# Patient Record
Sex: Female | Born: 1935
Health system: Southern US, Community
[De-identification: ages and names within clinical notes are randomized; demographics above are authoritative.]

## PROBLEM LIST (undated history)

## (undated) DIAGNOSIS — J45909 Unspecified asthma, uncomplicated: Secondary | ICD-10-CM

## (undated) DIAGNOSIS — J449 Chronic obstructive pulmonary disease, unspecified: Secondary | ICD-10-CM

## (undated) DIAGNOSIS — K219 Gastro-esophageal reflux disease without esophagitis: Secondary | ICD-10-CM

## (undated) DIAGNOSIS — I10 Essential (primary) hypertension: Secondary | ICD-10-CM

## (undated) DIAGNOSIS — M1990 Unspecified osteoarthritis, unspecified site: Secondary | ICD-10-CM

## (undated) HISTORY — PX: CHOLECYSTECTOMY: SHX55

---

## 2013-08-10 ENCOUNTER — Encounter (HOSPITAL_COMMUNITY): Payer: Self-pay | Admitting: Emergency Medicine

## 2013-08-10 ENCOUNTER — Emergency Department (HOSPITAL_COMMUNITY): Payer: Medicare (Managed Care)

## 2013-08-10 ENCOUNTER — Emergency Department (HOSPITAL_COMMUNITY)
Admission: EM | Admit: 2013-08-10 | Discharge: 2013-08-10 | Disposition: A | Discharge status: Home / Self Care | Payer: Medicare (Managed Care) | Attending: Emergency Medicine | Admitting: Emergency Medicine

## 2013-08-10 DIAGNOSIS — J449 Chronic obstructive pulmonary disease, unspecified: Secondary | ICD-10-CM | POA: Diagnosis not present

## 2013-08-10 DIAGNOSIS — Z8719 Personal history of other diseases of the digestive system: Secondary | ICD-10-CM | POA: Insufficient documentation

## 2013-08-10 DIAGNOSIS — I10 Essential (primary) hypertension: Secondary | ICD-10-CM | POA: Diagnosis not present

## 2013-08-10 DIAGNOSIS — J45909 Unspecified asthma, uncomplicated: Secondary | ICD-10-CM

## 2013-08-10 DIAGNOSIS — J4489 Other specified chronic obstructive pulmonary disease: Secondary | ICD-10-CM | POA: Insufficient documentation

## 2013-08-10 DIAGNOSIS — R0602 Shortness of breath: Secondary | ICD-10-CM | POA: Diagnosis present

## 2013-08-10 HISTORY — DX: Essential (primary) hypertension: I10

## 2013-08-10 HISTORY — DX: Unspecified asthma, uncomplicated: J45.909

## 2013-08-10 HISTORY — DX: Chronic obstructive pulmonary disease, unspecified: J44.9

## 2013-08-10 HISTORY — DX: Gastro-esophageal reflux disease without esophagitis: K21.9

## 2013-08-10 LAB — CBC
HCT: 37.7 % (ref 36.0–46.0)
Hemoglobin: 12.7 g/dL (ref 12.0–15.0)
MCH: 31.1 pg (ref 26.0–34.0)
MCHC: 33.7 g/dL (ref 30.0–36.0)
MCV: 92.2 fL (ref 78.0–100.0)
PLATELETS: 192 10*3/uL (ref 150–400)
RBC: 4.09 MIL/uL (ref 3.87–5.11)
RDW: 13.3 % (ref 11.5–15.5)
WBC: 6.4 10*3/uL (ref 4.0–10.5)

## 2013-08-10 LAB — BASIC METABOLIC PANEL
BUN: 15 mg/dL (ref 6–23)
CO2: 24 meq/L (ref 19–32)
CREATININE: 0.9 mg/dL (ref 0.50–1.10)
Calcium: 10.2 mg/dL (ref 8.4–10.5)
Chloride: 100 mEq/L (ref 96–112)
GFR calc Af Amer: 68 mL/min — ABNORMAL LOW (ref >90)
GFR, EST NON AFRICAN AMERICAN: 59 mL/min — AB (ref >90)
GLUCOSE: 86 mg/dL (ref 70–99)
Potassium: 4.1 mEq/L (ref 3.7–5.3)
Sodium: 138 mEq/L (ref 137–147)

## 2013-08-10 LAB — PRO B NATRIURETIC PEPTIDE: Pro B Natriuretic peptide (BNP): 68.8 pg/mL (ref 0–450)

## 2013-08-10 MED ORDER — METHYLPREDNISOLONE SODIUM SUCC 125 MG IJ SOLR
125.0000 mg | Freq: Once | INTRAMUSCULAR | Status: AC
  Administered 2013-08-10: 125 mg via INTRAVENOUS
  Filled 2013-08-10: qty 2

## 2013-08-10 MED ORDER — PREDNISONE 10 MG PO TABS: 20.0000 mg | ORAL_TABLET | Freq: Every day | ORAL | Status: DC

## 2013-08-10 MED ORDER — ALBUTEROL SULFATE (2.5 MG/3ML) 0.083% IN NEBU
INHALATION_SOLUTION | RESPIRATORY_TRACT | Status: AC
  Administered 2013-08-10: 14:00:00
  Filled 2013-08-10: qty 3

## 2013-08-10 MED ORDER — ALBUTEROL (5 MG/ML) CONTINUOUS INHALATION SOLN
10.0000 mg/h | INHALATION_SOLUTION | RESPIRATORY_TRACT | Status: AC
  Administered 2013-08-10: 10 mg/h via RESPIRATORY_TRACT
  Filled 2013-08-10: qty 20

## 2013-08-10 NOTE — ED Provider Notes (Signed)
CSN: [id number]     Arrival date & time 08/10/13  1319 History   First MD Initiated Contact with Patient 08/10/13 1346     Chief Complaint  Patient presents with  . Shortness of Breath     (Consider location/radiation/quality/duration/timing/severity/associated sxs/prior Treatment) Patient is a 80 y.o. female presenting with shortness of breath. The history is provided by the patient.  Shortness of Breath  patient here complaining of worsening asthma and shortness of breath x2 days. Seen 2 weeks ago for similar symptoms and treated with inhalers as well as prednisone. Patient was doing well until the last 2 days. Symptoms are similar to her prior asthma. Denies any anginal chest pain. No fever. Cough has been nonproductive. No lower extremity edema. No vomiting or diarrhea. Continues to use her home inhalers.  Past Medical History  Diagnosis Date  . COPD (chronic obstructive pulmonary disease)   . Asthma   . Hypertension   . GERD (gastroesophageal reflux disease)    Past Surgical History  Procedure Laterality Date  . Cholecystectomy     No family history on file. History  Substance Use Topics  . Smoking status: Never Smoker   . Smokeless tobacco: Not on file  . Alcohol Use: No   OB History   Grav Para Term Preterm Abortions TAB SAB Ect Mult Living                 Review of Systems  Respiratory: Positive for shortness of breath.   All other systems reviewed and are negative.     Allergies  Review of patient's allergies indicates no known allergies.  Home Medications   Prior to Admission medications   Not on File   BP 149/71  Pulse 73  Temp(Src) 97.9 F (36.6 C) (Oral)  Resp 25  SpO2 98% Physical Exam  Nursing note and vitals reviewed. Constitutional: She is oriented to person, place, and time. She appears well-developed and well-nourished.  Non-toxic appearance. No distress.  HENT:  Head: Normocephalic and atraumatic.  Eyes: Conjunctivae, EOM and lids are  normal. Pupils are equal, round, and reactive to light.  Neck: Normal range of motion. Neck supple. No tracheal deviation present. No mass present.  Cardiovascular: Normal rate, regular rhythm and normal heart sounds.  Exam reveals no gallop.   No murmur heard. Pulmonary/Chest: Effort normal. No stridor. No respiratory distress. She has decreased breath sounds. She has wheezes. She has no rhonchi. She has no rales.  Abdominal: Soft. Normal appearance and bowel sounds are normal. She exhibits no distension. There is no tenderness. There is no rebound and no CVA tenderness.  Musculoskeletal: Normal range of motion. She exhibits no edema and no tenderness.  Neurological: She is alert and oriented to person, place, and time. She has normal strength. No cranial nerve deficit or sensory deficit. GCS eye subscore is 4. GCS verbal subscore is 5. GCS motor subscore is 6.  Skin: Skin is warm and dry. No abrasion and no rash noted.  Psychiatric: She has a normal mood and affect. Her speech is normal and behavior is normal.    ED Course  Procedures (including critical care time) Labs Review Labs Reviewed  BASIC METABOLIC PANEL  CBC  PRO B NATRIURETIC PEPTIDE    Imaging Review No results found.   EKG Interpretation None      MDM   Final diagnoses:  None    Patient given Solu-Medrol and albuterol treatments here and feels better. Repeat exam shows markedly improved wheezing. She  has no respiratory distress. Pulse oximetry stable. Be discharged to home with prescription for prednisone and given instructions on how to use her home nebulizer   Toy BakerAnthony T Allen, MD 08/10/13 1553

## 2013-08-10 NOTE — Discharge Instructions (Signed)
Use your home nebulizer every 4-6 hours for the next 2 days. Return here for any trouble breathing Asthma Asthma is a recurring condition in which the airways tighten and narrow. Asthma can make it difficult to breathe. It can cause coughing, wheezing, and shortness of breath. Asthma episodes, also called asthma attacks, range from minor to life-threatening. Asthma cannot be cured, but medicines and lifestyle changes can help control it. CAUSES Asthma is believed to be caused by inherited (genetic) and environmental factors, but its exact cause is unknown. Asthma may be triggered by allergens, lung infections, or irritants in the air. Asthma triggers are different for each person. Common triggers include:   Animal dander.  Dust mites.  Cockroaches.  Pollen from trees or grass.  Mold.  Smoke.  Air pollutants such as dust, household cleaners, hair sprays, aerosol sprays, paint fumes, strong chemicals, or strong odors.  Cold air, weather changes, and winds (which increase molds and pollens in the air).  Strong emotional expressions such as crying or laughing hard.  Stress.  Certain medicines (such as aspirin) or types of drugs (such as beta-blockers).  Sulfites in foods and drinks. Foods and drinks that may contain sulfites include dried fruit, potato chips, and sparkling grape juice.  Infections or inflammatory conditions such as the flu, a cold, or an inflammation of the nasal membranes (rhinitis).  Gastroesophageal reflux disease (GERD).  Exercise or strenuous activity. SYMPTOMS Symptoms may occur immediately after asthma is triggered or many hours later. Symptoms include:  Wheezing.  Excessive nighttime or early morning coughing.  Frequent or severe coughing with a common cold.  Chest tightness.  Shortness of breath. DIAGNOSIS  The diagnosis of asthma is made by a review of your medical history and a physical exam. Tests may also be performed. These may  include:  Lung function studies. These tests show how much air you breathe in and out.  Allergy tests.  Imaging tests such as X-rays. TREATMENT  Asthma cannot be cured, but it can usually be controlled. Treatment involves identifying and avoiding your asthma triggers. It also involves medicines. There are 2 classes of medicine used for asthma treatment:   Controller medicines. These prevent asthma symptoms from occurring. They are usually taken every day.  Reliever or rescue medicines. These quickly relieve asthma symptoms. They are used as needed and provide short-term relief. Your health care provider will help you create an asthma action plan. An asthma action plan is a written plan for managing and treating your asthma attacks. It includes a list of your asthma triggers and how they may be avoided. It also includes information on when medicines should be taken and when their dosage should be changed. An action plan may also involve the use of a device called a peak flow meter. A peak flow meter measures how well the lungs are working. It helps you monitor your condition. HOME CARE INSTRUCTIONS   Take medicine as directed by your health care provider. Speak with your health care provider if you have questions about how or when to take the medicines.  Use a peak flow meter as directed by your health care provider. Record and keep track of readings.  Understand and use the action plan to help minimize or stop an asthma attack without needing to seek medical care.  Control your home environment in the following ways to help prevent asthma attacks:  Do not smoke. Avoid being exposed to secondhand smoke.  Change your heating and air conditioning filter regularly.  Limit your use of fireplaces and wood stoves.  Get rid of pests (such as roaches and mice) and their droppings.  Throw away plants if you see mold on them.  Clean your floors and dust regularly. Use unscented cleaning  products.  Try to have someone else vacuum for you regularly. Stay out of rooms while they are being vacuumed and for a short while afterward. If you vacuum, use a dust mask from a hardware store, a double-layered or microfilter vacuum cleaner bag, or a vacuum cleaner with a HEPA filter.  Replace carpet with wood, tile, or vinyl flooring. Carpet can trap dander and dust.  Use allergy-proof pillows, mattress covers, and box spring covers.  Wash bed sheets and blankets every week in hot water and dry them in a dryer.  Use blankets that are made of polyester or cotton.  Clean bathrooms and kitchens with bleach. If possible, have someone repaint the walls in these rooms with mold-resistant paint. Keep out of the rooms that are being cleaned and painted.  Wash hands frequently. SEEK MEDICAL CARE IF:   You have wheezing, shortness of breath, or a cough even if taking medicine to prevent attacks.  The colored mucus you cough up (sputum) is thicker than usual.  Your sputum changes from clear or white to yellow, green, gray, or bloody.  You have any problems that may be related to the medicines you are taking (such as a rash, itching, swelling, or trouble breathing).  You are using a reliever medicine more than 2-3 times per week.  Your peak flow is still at 50-79% of your personal best after following your action plan for 1 hour. SEEK IMMEDIATE MEDICAL CARE IF:   You seem to be getting worse and are unresponsive to treatment during an asthma attack.  You are short of breath even at rest.  You get short of breath when doing very little physical activity.  You have difficulty eating, drinking, or talking due to asthma symptoms.  You develop chest pain.  You develop a fast heartbeat.  You have a bluish color to your lips or fingernails.  You are lightheaded, dizzy, or faint.  Your peak flow is less than 50% of your personal best.  You have a fever or persistent symptoms for more  than 2-3 days.  You have a fever and symptoms suddenly get worse. MAKE SURE YOU:   Understand these instructions.  Will watch your condition.  Will get help right away if you are not doing well or get worse. Document Released: 02/09/2005 Document Revised: 02/14/2013 Document Reviewed: 09/08/2012 [facility] Patient Information 2015 [place], [place]. This information is not intended to replace advice given to you by your health care provider. Make sure you discuss any questions you have with your health care provider.

## 2013-08-10 NOTE — ED Notes (Signed)
Patient states that she has a history of asthma and has had shortness of breath x 2 -3 days

## 2014-09-21 ENCOUNTER — Encounter (HOSPITAL_COMMUNITY): Payer: Self-pay

## 2014-09-21 ENCOUNTER — Emergency Department (HOSPITAL_COMMUNITY): Payer: Medicare (Managed Care)

## 2014-09-21 ENCOUNTER — Emergency Department (HOSPITAL_COMMUNITY)
Admission: EM | Admit: 2014-09-21 | Discharge: 2014-09-21 | Disposition: A | Discharge status: Home / Self Care | Payer: Medicare (Managed Care) | Attending: Emergency Medicine | Admitting: Emergency Medicine

## 2014-09-21 DIAGNOSIS — W182XXA Fall in (into) shower or empty bathtub, initial encounter: Secondary | ICD-10-CM | POA: Insufficient documentation

## 2014-09-21 DIAGNOSIS — S73101A Unspecified sprain of right hip, initial encounter: Secondary | ICD-10-CM | POA: Insufficient documentation

## 2014-09-21 DIAGNOSIS — Y9389 Activity, other specified: Secondary | ICD-10-CM | POA: Diagnosis not present

## 2014-09-21 DIAGNOSIS — S43401A Unspecified sprain of right shoulder joint, initial encounter: Secondary | ICD-10-CM | POA: Diagnosis not present

## 2014-09-21 DIAGNOSIS — S4991XA Unspecified injury of right shoulder and upper arm, initial encounter: Secondary | ICD-10-CM | POA: Diagnosis present

## 2014-09-21 DIAGNOSIS — S6391XA Sprain of unspecified part of right wrist and hand, initial encounter: Secondary | ICD-10-CM | POA: Insufficient documentation

## 2014-09-21 DIAGNOSIS — K219 Gastro-esophageal reflux disease without esophagitis: Secondary | ICD-10-CM | POA: Diagnosis not present

## 2014-09-21 DIAGNOSIS — Y998 Other external cause status: Secondary | ICD-10-CM | POA: Insufficient documentation

## 2014-09-21 DIAGNOSIS — I10 Essential (primary) hypertension: Secondary | ICD-10-CM | POA: Insufficient documentation

## 2014-09-21 DIAGNOSIS — Y9289 Other specified places as the place of occurrence of the external cause: Secondary | ICD-10-CM | POA: Diagnosis not present

## 2014-09-21 DIAGNOSIS — Z79899 Other long term (current) drug therapy: Secondary | ICD-10-CM | POA: Diagnosis not present

## 2014-09-21 DIAGNOSIS — J449 Chronic obstructive pulmonary disease, unspecified: Secondary | ICD-10-CM | POA: Insufficient documentation

## 2014-09-21 LAB — I-STAT CHEM 8, ED
BUN: 16 mg/dL (ref 6–20)
CHLORIDE: 103 mmol/L (ref 101–111)
CREATININE: 0.9 mg/dL (ref 0.44–1.00)
Calcium, Ion: 1.22 mmol/L (ref 1.13–1.30)
GLUCOSE: 94 mg/dL (ref 65–99)
HCT: 39 % (ref 36.0–46.0)
Hemoglobin: 13.3 g/dL (ref 12.0–15.0)
POTASSIUM: 4.5 mmol/L (ref 3.5–5.1)
Sodium: 138 mmol/L (ref 135–145)
TCO2: 24 mmol/L (ref 0–100)

## 2014-09-21 MED ORDER — OXYCODONE-ACETAMINOPHEN 5-325 MG PO TABS
1.0000 | ORAL_TABLET | Freq: Three times a day (TID) | ORAL | Status: DC | PRN
Start: 2014-09-21 — End: 2014-11-02

## 2014-09-21 MED ORDER — OXYCODONE-ACETAMINOPHEN 5-325 MG PO TABS
2.0000 | ORAL_TABLET | Freq: Once | ORAL | Status: AC
  Administered 2014-09-21: 2 via ORAL
  Filled 2014-09-21: qty 2

## 2014-09-21 NOTE — ED Provider Notes (Signed)
CSN: [id number]     Arrival date & time 09/21/14  1837 History   First MD Initiated Contact with Patient 09/21/14 2049     Chief Complaint - fall    Patient is a 81 y.o. female presenting with fall. The history is provided by the patient and a relative.  Fall This is a new problem. The current episode started more than 2 days ago. The problem has been gradually worsening. Pertinent negatives include no chest pain, no abdominal pain and no headaches. Exacerbated by: walking. The symptoms are relieved by rest.  pt slipped/fell in shower 4 days ago She landed on right shoulder.  She has had pain in right shoulder/hand and also right hip  She did not want to be evaluated at that time but since has had increased pain and difficulty walking No neck or back pain No HA No LOC No CP No abdominal pain  Pt is from [place].  She speaks some english and her daughter is at bedside to assist.   Past Medical History  Diagnosis Date  . COPD (chronic obstructive pulmonary disease)   . Asthma   . Hypertension   . GERD (gastroesophageal reflux disease)    Past Surgical History  Procedure Laterality Date  . Cholecystectomy     History reviewed. No pertinent family history. History  Substance Use Topics  . Smoking status: Never Smoker   . Smokeless tobacco: Not on file  . Alcohol Use: No   OB History    No data available     Review of Systems  Cardiovascular: Negative for chest pain.  Gastrointestinal: Negative for abdominal pain.  Musculoskeletal: Positive for arthralgias. Negative for back pain and neck pain.  Neurological: Negative for headaches.  All other systems reviewed and are negative.     Allergies  Review of patient's allergies indicates no known allergies.  Home Medications   Prior to Admission medications   Medication Sig Start Date End Date Taking? Authorizing Provider  acetaminophen (TYLENOL) 325 MG tablet Take 650 mg by mouth at bedtime.   Yes Historical Provider, MD   albuterol (PROVENTIL HFA;VENTOLIN HFA) 108 (90 BASE) MCG/ACT inhaler Inhale 1-2 puffs into the lungs every 6 (six) hours as needed for wheezing or shortness of breath.   Yes Historical Provider, MD  albuterol (PROVENTIL) (2.5 MG/3ML) 0.083% nebulizer solution Take 2.5 mg by nebulization every 6 (six) hours as needed for wheezing or shortness of breath.   Yes Historical Provider, MD  esomeprazole (NEXIUM) 40 MG capsule Take 40 mg by mouth daily at 12 noon.   Yes Historical Provider, MD  Fluticasone-Salmeterol (ADVAIR) 500-50 MCG/DOSE AEPB Inhale 1 puff into the lungs 2 (two) times daily.   Yes Historical Provider, MD  hydrochlorothiazide (MICROZIDE) 12.5 MG capsule Take 12.5 mg by mouth daily.   Yes Historical Provider, MD  montelukast (SINGULAIR) 10 MG tablet Take 10 mg by mouth at bedtime.   Yes Historical Provider, MD  valsartan (DIOVAN) 80 MG tablet Take 80 mg by mouth daily.   Yes Historical Provider, MD  predniSONE (DELTASONE) 10 MG tablet Take 2 tablets (20 mg total) by mouth daily. Patient not taking: Reported on 09/21/2014 08/10/13   Lorre Nick, MD   BP 172/58 mmHg  Pulse 77  Temp(Src) 98.6 F (37 C) (Oral)  Resp 20  SpO2 99% Physical Exam CONSTITUTIONAL: Well developed/well nourished HEAD: Normocephalic/atraumatic EYES: EOMI/PERRL ENMT: Mucous membranes moist NECK: supple no meningeal signs SPINE/BACK:entire spine nontender, No bruising/crepitance/stepoffs noted to spine CV: S1/S2 noted,  no murmurs/rubs/gallops noted LUNGS: Lungs are clear to auscultation bilaterally, no apparent distress Chest - nontender ABDOMEN: soft, nontender, no rebound or guarding, bowel sounds noted throughout abdomen GU:no cva tenderness NEURO: Pt is awake/alert/appropriate, moves all extremitiesx4.  EXTREMITIES: pulses normal/equal, full ROM.  Tenderness to palpation of right shoulder/hand.  No deformity.  She can abduct to 90 degrees only on right arm.   She also has mild tenderness with ROM of  right hip.  All other extremities/joints palpated/ranged and nontender SKIN: warm, color normal PSYCH: no abnormalities of mood noted, alert and oriented to situation  ED Course  Procedures 10:07 PM Pt with fall earlier this week with worsening pain and limitations in ambulation Awaiting hip xray Pt stable at this time I have consulted case management 11:19 PM Pt improved No fracture noted She can now ambulate with walker No focal weakness noted in upper/lower extremities Will give sling for right arm (concern for possible rotator cuff injury to shoulder) Case management has seen pt and outpatient arrangements have been made Labs Review Labs Reviewed  I-STAT CHEM 8, ED    Imaging Review Dg Shoulder Right  09/21/2014   CLINICAL DATA:  Fall 5 days ago.  Increasing RIGHT shoulder pain  EXAM: RIGHT SHOULDER - 2+ VIEW  COMPARISON:  COMPARISON None  FINDINGS: FINDINGS No fracture or dislocation RIGHT shoulder. There is severe osteophytosis and joint space narrowing of the glenohumeral joint.  IMPRESSION: IMPRESSION 1. No acute findings of the RIGHT shoulder. 2. Severe arthropathy of the RIGHT shoulder.   Electronically Signed   By: Genevive Bi M.D.   On: 09/21/2014 20:08   Dg Hand Complete Right  09/21/2014   CLINICAL DATA:  Fall 5 days ago in shower.  Increasing hand pain.  EXAM: RIGHT HAND - COMPLETE 3+ VIEW  COMPARISON:  None.  FINDINGS: No acute bony abnormality. Specifically, no fracture, subluxation, or dislocation. Soft tissues are intact.  IMPRESSION: No acute bony abnormality.   Electronically Signed   By: Charlett Nose M.D.   On: 09/21/2014 20:07   Dg Hip Unilat With Pelvis 2-3 Views Right  09/21/2014   CLINICAL DATA:  Generalized right hip pain after fall in the shower.  EXAM: DG HIP (WITH OR WITHOUT PELVIS) 2-3V RIGHT  COMPARISON:  None.  FINDINGS: The cortical margins of the bony pelvis and right hip are intact. No fracture. Pubic symphysis and sacroiliac joints are  congruent. Both femoral heads are well-seated in the respective acetabula. Mild osteoarthritis of both hips. Multiple flank calcifications likely injection granulomas.  IMPRESSION: No fracture or subluxation of the pelvis or right hip.   Electronically Signed   By: Rubye Oaks M.D.   On: 09/21/2014 22:43    Medications  oxyCODONE-acetaminophen (PERCOCET/ROXICET) 5-325 MG per tablet 2 tablet (2 tablets Oral Given 09/21/14 2121)     MDM   Final diagnoses:  Shoulder sprain, right, initial encounter  Sprain of right hand, initial encounter  Sprain of right hip, initial encounter    Nursing notes including past medical history and social history reviewed and considered in documentation xrays/imaging reviewed by myself and considered during evaluation Labs/vital reviewed myself and considered during evaluation     Zadie Rhine, MD 09/21/14 2321

## 2014-09-21 NOTE — Progress Notes (Signed)
Orthopedic Tech Progress Note Patient Details:  Leslie Farley 1930-10-21 [id number]  Ortho Devices Type of Ortho Device: Arm sling Ortho Device/Splint Interventions: Application   Shawnie Pons 09/21/2014, 11:38 PM

## 2014-09-21 NOTE — Care Management Note (Signed)
Case Management Note  Patient Details  Name: Leslie Farley MRN: [id number] Date of Birth: [birth year]  Subjective/Objective:    Patient presents to Ed post fall at home with pain in her right shoulder                Action/Plan:  Discussed home health services with patient and her daughter at bedside   Expected Discharge Date:     09/21/2014             Expected Discharge Plan:  Home w Home Health Services  In-House Referral:     Discharge planning Services  CM Consult  Post Acute Care Choice:  Durable Medical Equipment Choice offered to:  Patient, Adult Children  DME Arranged:  Wheelchair manual DME Agency:  [facility]  HH Arranged:  PT, OT, Nurse's Aide HH Agency:  [facility]  Status of Service:  Completed, signed off  Medicare Important Message Given:    Date Medicare IM Given:    Medicare IM give by:    Date Additional Medicare IM Given:    Additional Medicare Important Message give by:     If discussed at Long Length of Stay Meetings, dates discussed:    Additional Comments:  EDCM spoke to patient and her daughter at bedside.  Patient lives at home with her other daughter Leslie Farley, son in law and her grandaughter.  Patient's daughter reports since the fall the patient has been ambulating with a walker at home, she usually uses a cane.  Patient is requiring assistance with her ADL's at home.  She is receiving assistance by her daughter Leslie Farley.  Patient does not have a pcp.  EDCM provided patient with list of pcps who accept Medicare insurance within a ten mile radius of patient's zip code.  EDCM assessed for further dme needs at home.  Patient's daughter reports she will be going to [employer] tomorrow to purchase a shower chair and possibly an elevated toilet seat.  EDCM provided patient;s daugher with list of private duty nursing agencies, explained it would be an out of pocket expense.  Patient's daughter reports she will probably be hiring an agency  temporarily until patients insurance is straightened out.   [facility] chosen for home health services.  [facility] discussed patient with EDP who placed orders for home health PT, OT and aide and wheelchair.  [facility] faxed referral to [facility] with confirmation of receipt.  Patient and patient's daughter thankful for services.  No further EDCm needs at this time.  Bennie Dallas, Chioma Mukherjee, RN 09/21/2014, 10:56 PM

## 2014-09-21 NOTE — ED Notes (Signed)
Per family, pt fell in shower on Monday.  EMS checked out and pt did not want to be seen.  Pt has not been as active this week.  Pt states pain in rt shoulder and rt hand with some pain in left shoulder.  Pt slipped getting soap off floor of shower.  No LOC.  No head injury.

## 2014-09-24 NOTE — Progress Notes (Signed)
[facility] called patient for follow up and spoke to patient's daughter Leslie Farley.  Per, Leslie Farley, patient is doing "very good."  She reports [facility] has called her on Saturday and are coming to see the patient on Monday.  Patient's daughter thankful for follow up phone call.  No further EDCm needs at this time.

## 2014-09-30 ENCOUNTER — Emergency Department (HOSPITAL_COMMUNITY)
Admission: EM | Admit: 2014-09-30 | Discharge: 2014-10-01 | Disposition: A | Discharge status: Home / Self Care | Payer: Medicare (Managed Care) | Attending: Emergency Medicine | Admitting: Emergency Medicine

## 2014-09-30 ENCOUNTER — Encounter (HOSPITAL_COMMUNITY): Payer: Self-pay | Admitting: Emergency Medicine

## 2014-09-30 DIAGNOSIS — Z87891 Personal history of nicotine dependence: Secondary | ICD-10-CM | POA: Diagnosis not present

## 2014-09-30 DIAGNOSIS — I10 Essential (primary) hypertension: Secondary | ICD-10-CM | POA: Insufficient documentation

## 2014-09-30 DIAGNOSIS — Y92193 Bedroom in other specified residential institution as the place of occurrence of the external cause: Secondary | ICD-10-CM | POA: Insufficient documentation

## 2014-09-30 DIAGNOSIS — S79911A Unspecified injury of right hip, initial encounter: Secondary | ICD-10-CM | POA: Diagnosis not present

## 2014-09-30 DIAGNOSIS — S82441A Displaced spiral fracture of shaft of right fibula, initial encounter for closed fracture: Secondary | ICD-10-CM | POA: Insufficient documentation

## 2014-09-30 DIAGNOSIS — Y998 Other external cause status: Secondary | ICD-10-CM | POA: Diagnosis not present

## 2014-09-30 DIAGNOSIS — W19XXXA Unspecified fall, initial encounter: Secondary | ICD-10-CM

## 2014-09-30 DIAGNOSIS — K219 Gastro-esophageal reflux disease without esophagitis: Secondary | ICD-10-CM | POA: Insufficient documentation

## 2014-09-30 DIAGNOSIS — Y9389 Activity, other specified: Secondary | ICD-10-CM | POA: Diagnosis not present

## 2014-09-30 DIAGNOSIS — Z79899 Other long term (current) drug therapy: Secondary | ICD-10-CM | POA: Insufficient documentation

## 2014-09-30 DIAGNOSIS — W1839XA Other fall on same level, initial encounter: Secondary | ICD-10-CM | POA: Insufficient documentation

## 2014-09-30 DIAGNOSIS — S82891A Other fracture of right lower leg, initial encounter for closed fracture: Secondary | ICD-10-CM

## 2014-09-30 DIAGNOSIS — M1990 Unspecified osteoarthritis, unspecified site: Secondary | ICD-10-CM | POA: Diagnosis not present

## 2014-09-30 DIAGNOSIS — J449 Chronic obstructive pulmonary disease, unspecified: Secondary | ICD-10-CM | POA: Insufficient documentation

## 2014-09-30 DIAGNOSIS — R319 Hematuria, unspecified: Secondary | ICD-10-CM

## 2014-09-30 DIAGNOSIS — N390 Urinary tract infection, site not specified: Secondary | ICD-10-CM | POA: Insufficient documentation

## 2014-09-30 DIAGNOSIS — Z7951 Long term (current) use of inhaled steroids: Secondary | ICD-10-CM | POA: Diagnosis not present

## 2014-09-30 DIAGNOSIS — S99911A Unspecified injury of right ankle, initial encounter: Secondary | ICD-10-CM | POA: Diagnosis present

## 2014-09-30 HISTORY — DX: Unspecified osteoarthritis, unspecified site: M19.90

## 2014-09-30 NOTE — ED Notes (Signed)
Bed: WA08 Expected date:  Expected time:  Means of arrival:  Comments: EMS 81yo Gen weakness / fall

## 2014-09-30 NOTE — ED Notes (Signed)
[facility] presents with a 81 yo feamle from home with generalized weakness and multiple falls over the past week including tonight.  Pt was witnessed reaching for hwer walker and overextended from a seated position and fell on side.  Pt also fell on Friday evening/Saturday morning while attempting to go to the bathroom and was down on ground for several hours before family discovered her on floor.  [facility] was called but patient refused transport at that time.  Pt complains of bilateral foot pain; however, right foot hurting worse than the left.  Pt has hx of osteoarthritis and chronic pain.  Daughter at bedside.

## 2014-09-30 NOTE — ED Provider Notes (Signed)
TIME SEEN: 12:09 AM   CHIEF COMPLAINT: Fall  HPI:  HPI Comments: Leslie Farley is a 81 y.o. female, with a PMhx of COPD, asthma, HTN  brought in by ambulance, who presents to the Emergency Department with her daughter complaining of constant, moderate right-sided hip pain and RLE pain s/p fall that occurred pta. The pt states her pain is worse in her right ankle and additionally reports right-sided neck pain. Per daughter, the pt fell on her right side from a seated position while over extending reaching for her walker. Daughter states the pt was unable to stand up after the fall that occurred this evening. Family member states pt has fallen multiple times, including tonight, since her first fall on 7/25. Pt is ambulatory with a walker s/p first fall that occurred 14 days ago. The pt was evaluated in the ED after the first fall when she fell while in the shower bending down and reaching for soap. X-rays at that time were negative. The pt fell a second time from standing after losing her balance while turning with her new walker when she tried to turn around in her bedroom when she was getting up to go to the bathroom in the middle of the night. EMS reported to scene but pt refused transportation after the second fall. The pt additionally reports mild SOB during EMS transport this evening but denies any current SOB or SOB attributable to her recent falls.  Pt denies head injury, LOC, CP.  Denies fever, cough, vomiting or diarrhea. Not on anticoagulation or antiplatelets agent. NKDA. Pt is not followed by a local PCP as she recently moved from [place].    Patient's daughter reports patient speaks [place]. Daughter is interpreting.  [name] - daughter - cell [phone] Lateisha - daughter - cell [phone]   ROS: See HPI Constitutional: no fever  Eyes: no drainage  ENT: no runny nose   Cardiovascular:  no chest pain  Resp: no SOB  GI: no vomiting GU: no dysuria Integumentary: no rash   Allergy: no hives  Musculoskeletal: no leg swelling  Neurological: no slurred speech ROS otherwise negative  PAST MEDICAL HISTORY/PAST SURGICAL HISTORY:  Past Medical History  Diagnosis Date  . COPD (chronic obstructive pulmonary disease)   . Asthma   . Hypertension   . GERD (gastroesophageal reflux disease)   . Arthritis     MEDICATIONS:  Prior to Admission medications   Medication Sig Start Date End Date Taking? Authorizing Provider  albuterol (PROVENTIL HFA;VENTOLIN HFA) 108 (90 BASE) MCG/ACT inhaler Inhale 1-2 puffs into the lungs every 6 (six) hours as needed for wheezing or shortness of breath.   Yes Historical Provider, MD  albuterol (PROVENTIL) (2.5 MG/3ML) 0.083% nebulizer solution Take 2.5 mg by nebulization every 6 (six) hours as needed for wheezing or shortness of breath.   Yes Historical Provider, MD  esomeprazole (NEXIUM) 40 MG capsule Take 40 mg by mouth daily at 12 noon.   Yes Historical Provider, MD  Fluticasone-Salmeterol (ADVAIR) 500-50 MCG/DOSE AEPB Inhale 1 puff into the lungs 2 (two) times daily.   Yes Historical Provider, MD  hydrochlorothiazide (MICROZIDE) 12.5 MG capsule Take 12.5 mg by mouth daily.   Yes Historical Provider, MD  montelukast (SINGULAIR) 10 MG tablet Take 10 mg by mouth at bedtime.   Yes Historical Provider, MD  oxyCODONE-acetaminophen (PERCOCET/ROXICET) 5-325 MG per tablet Take 1 tablet by mouth every 8 (eight) hours as needed for severe pain. 09/21/14  Yes Zadie Rhine, MD  valsartan (DIOVAN) 80  MG tablet Take 80 mg by mouth daily.   Yes Historical Provider, MD    ALLERGIES:  No Known Allergies  SOCIAL HISTORY:  History  Substance Use Topics  . Smoking status: Former Smoker    Quit date: 02/24/1971  . Smokeless tobacco: Never Used  . Alcohol Use: No    FAMILY HISTORY: History reviewed. No pertinent family history.  EXAM: BP 170/88 mmHg  Pulse 80  Temp(Src) 97.4 F (36.3 C) (Oral)  Resp 20  SpO2 99% CONSTITUTIONAL: Alert  and oriented and responds appropriately to questions. Elderly, appears uncomfortable, GCS 15, afebrile, nontoxic HEAD: Normocephalic; atraumatic EYES: Conjunctivae clear, PERRL, EOMI ENT: normal nose; no rhinorrhea; moist mucous membranes; pharynx without lesions noted; no dental injury; no septal hematoma NECK: Supple, no meningismus, no LAD; upper cervical midline spinal tenderness without step-off or deformity CARD: RRR; S1 and S2 appreciated; no murmurs, no clicks, no rubs, no gallops RESP: Normal chest excursion without splinting or tachypnea; breath sounds clear and equal bilaterally; no wheezes, no rhonchi, no rales; no hypoxia or respiratory distress CHEST:  chest wall stable, no crepitus or ecchymosis or deformity, nontender to palpation ABD/GI: Normal bowel sounds; non-distended; soft, non-tender, no rebound, no guarding PELVIS:  stable, nontender to palpation BACK:  The back appears normal and is non-tender to palpation, there is no CVA tenderness; no midline spinal tenderness, step-off or deformity EXT: Tender to lateral, right hip and hip is shortened and externally rotated; tender diffusely over the right ankle and dorsal foot without bony deformity, decreased motion in right hip secondary to pain, 2+ DP pulses bilaterally, otherwise normal ROM in all joints; otherwise extremities are non-tender to palpation; no edema; normal capillary refill; no cyanosis, no bony tenderness or bony deformity of patient's extremities, no joint effusion, no ecchymosis or lacerations SKIN: Normal color for age and race; warm NEURO: Moves all extremities equally, sensation to light touch intact diffusely, cranial nerves II through XII intact PSYCH: The patient's mood and manner are appropriate. Grooming and personal hygiene are appropriate.  MEDICAL DECISION MAKING: Patient here with 3 falls in the past 2 weeks. She is complaining of right hip and right ankle pain. Also had an episode of shortness of  breath with EMS but none currently. No preceding symptoms that led to her fall. Will obtain labs, urine, CT of her head and cervical spine, chest x-ray given this report of shortness of breath, x-ray of her right hip, right foot, right ankle. We'll give IV fluids, pain medication.  ED PROGRESS: Patient's labs are unremarkable. Urine does show small hemoglobin, small leukocytes and rare bacteria. She does have a few squamous cells. This was obtained by catheterization but may be 30 catch. Culture is pending but will treat with ceftriaxone for possible UTI. Chest x-ray clear. Troponin negative. EKG shows no ischemic changes. Chest x-ray clear with no rib fractures. CT of her head and cervical spine show no acute injury. Both x-ray and CT of the right hip show no hip fracture. X-ray of the right ankle shows spiral fracture of the distal fibula in near anatomic alignment and a tiny avulsion fracture of the medial malleolus. Ankle mortise appears intact. We have placed the patient in a posterior splint and will have her follow-up with orthopedics as an outpatient. She normally ambulates with a walker and daughter does not feel she will be able to use crutches. We'll consult case management and social work in the morning to help with possible placement versus home health resources, wheelchair.  I feel she does not need admission at this time and can be discharged. Patient's daughter [name] is comfortable with this plan.  Patient's daughter Makenleigh will come back to the emergency department in the morning.     EKG Interpretation  Date/Time:  Monday October 01 2014 01:37:07 EDT Ventricular Rate:  81 PR Interval:  169 QRS Duration: 82 QT Interval:  353 QTC Calculation: 410 R Axis:   89 Text Interpretation:  Sinus rhythm Ventricular premature complex Borderline right axis deviation No significant change since last tracing Confirmed by WARD,  DO, KRISTEN ([id number]) on 10/01/2014 1:44:33 AM        SPLINT  APPLICATION Date/Time: 5:07 AM Authorized by: Raelyn Number Consent: Verbal consent obtained. Risks and benefits: risks, benefits and alternatives were discussed Consent given by: patient Splint applied by: orthopedic technician Location details: Right ankle  Splint type: Posterior  Supplies used: fiberglass Post-procedure: The splinted body part was neurovascularly unchanged following the procedure. Patient tolerance: Patient tolerated the procedure well with no immediate complications.     Layla Maw Ward, DO 10/01/14 [phone]

## 2014-10-01 ENCOUNTER — Emergency Department (HOSPITAL_COMMUNITY): Payer: Medicare (Managed Care)

## 2014-10-01 DIAGNOSIS — M79671 Pain in right foot: Secondary | ICD-10-CM | POA: Diagnosis not present

## 2014-10-01 LAB — CBC WITH DIFFERENTIAL/PLATELET
Basophils Absolute: 0 10*3/uL (ref 0.0–0.1)
Basophils Relative: 0 % (ref 0–1)
Eosinophils Absolute: 0.1 10*3/uL (ref 0.0–0.7)
Eosinophils Relative: 2 % (ref 0–5)
HEMATOCRIT: 34.4 % — AB (ref 36.0–46.0)
HEMOGLOBIN: 11.3 g/dL — AB (ref 12.0–15.0)
LYMPHS PCT: 19 % (ref 12–46)
Lymphs Abs: 1.4 10*3/uL (ref 0.7–4.0)
MCH: 30 pg (ref 26.0–34.0)
MCHC: 32.8 g/dL (ref 30.0–36.0)
MCV: 91.2 fL (ref 78.0–100.0)
MONO ABS: 0.5 10*3/uL (ref 0.1–1.0)
Monocytes Relative: 7 % (ref 3–12)
NEUTROS ABS: 5.6 10*3/uL (ref 1.7–7.7)
Neutrophils Relative %: 72 % (ref 43–77)
PLATELETS: 230 10*3/uL (ref 150–400)
RBC: 3.77 MIL/uL — ABNORMAL LOW (ref 3.87–5.11)
RDW: 14.2 % (ref 11.5–15.5)
WBC: 7.7 10*3/uL (ref 4.0–10.5)

## 2014-10-01 LAB — I-STAT TROPONIN, ED: Troponin i, poc: 0.01 ng/mL (ref 0.00–0.08)

## 2014-10-01 LAB — COMPREHENSIVE METABOLIC PANEL
ALT: 34 U/L (ref 14–54)
AST: 46 U/L — ABNORMAL HIGH (ref 15–41)
Albumin: 3.9 g/dL (ref 3.5–5.0)
Alkaline Phosphatase: 57 U/L (ref 38–126)
Anion gap: 4 — ABNORMAL LOW (ref 5–15)
BUN: 21 mg/dL — ABNORMAL HIGH (ref 6–20)
CO2: 27 mmol/L (ref 22–32)
CREATININE: 1.01 mg/dL — AB (ref 0.44–1.00)
Calcium: 9.3 mg/dL (ref 8.9–10.3)
Chloride: 107 mmol/L (ref 101–111)
GFR calc non Af Amer: 51 mL/min — ABNORMAL LOW (ref >60)
GFR, EST AFRICAN AMERICAN: 59 mL/min — AB (ref >60)
Glucose, Bld: 130 mg/dL — ABNORMAL HIGH (ref 65–99)
Potassium: 3.7 mmol/L (ref 3.5–5.1)
Sodium: 138 mmol/L (ref 135–145)
Total Bilirubin: 0.4 mg/dL (ref 0.3–1.2)
Total Protein: 6.9 g/dL (ref 6.5–8.1)

## 2014-10-01 LAB — URINE MICROSCOPIC-ADD ON

## 2014-10-01 LAB — URINALYSIS, ROUTINE W REFLEX MICROSCOPIC
Bilirubin Urine: NEGATIVE
Glucose, UA: NEGATIVE mg/dL
Ketones, ur: NEGATIVE mg/dL
Nitrite: NEGATIVE
PH: 5.5 (ref 5.0–8.0)
PROTEIN: NEGATIVE mg/dL
Specific Gravity, Urine: 1.022 (ref 1.005–1.030)
Urobilinogen, UA: 0.2 mg/dL (ref 0.0–1.0)

## 2014-10-01 MED ORDER — SODIUM CHLORIDE 0.9 % IV SOLN
INTRAVENOUS | Status: DC
  Administered 2014-10-01: 02:00:00 via INTRAVENOUS

## 2014-10-01 MED ORDER — HYDROCODONE-ACETAMINOPHEN 5-325 MG PO TABS: 1.0000 | ORAL_TABLET | Freq: Four times a day (QID) | ORAL | Status: DC | PRN

## 2014-10-01 MED ORDER — FENTANYL CITRATE (PF) 100 MCG/2ML IJ SOLN
50.0000 ug | Freq: Once | INTRAMUSCULAR | Status: AC
  Administered 2014-10-01: 50 ug via INTRAVENOUS
  Filled 2014-10-01: qty 2

## 2014-10-01 MED ORDER — HYDROCODONE-ACETAMINOPHEN 5-325 MG PO TABS
1.0000 | ORAL_TABLET | Freq: Once | ORAL | Status: AC
  Administered 2014-10-01: 1 via ORAL
  Filled 2014-10-01: qty 1

## 2014-10-01 MED ORDER — ONDANSETRON 4 MG PO TBDP: 4.0000 mg | ORAL_TABLET | Freq: Three times a day (TID) | ORAL | Status: DC | PRN

## 2014-10-01 MED ORDER — CEPHALEXIN 500 MG PO CAPS: 500.0000 mg | ORAL_CAPSULE | Freq: Two times a day (BID) | ORAL | Status: DC

## 2014-10-01 MED ORDER — DOCUSATE SODIUM 100 MG PO CAPS: 100.0000 mg | ORAL_CAPSULE | Freq: Two times a day (BID) | ORAL | Status: DC

## 2014-10-01 MED ORDER — MORPHINE SULFATE 4 MG/ML IJ SOLN
4.0000 mg | Freq: Once | INTRAMUSCULAR | Status: AC
  Administered 2014-10-01: 4 mg via INTRAVENOUS
  Filled 2014-10-01: qty 1

## 2014-10-01 MED ORDER — DEXTROSE 5 % IV SOLN
1.0000 g | Freq: Once | INTRAVENOUS | Status: AC
  Administered 2014-10-01: 1 g via INTRAVENOUS
  Filled 2014-10-01: qty 10

## 2014-10-01 MED ORDER — ONDANSETRON HCL 4 MG/2ML IJ SOLN
4.0000 mg | Freq: Once | INTRAMUSCULAR | Status: AC
  Administered 2014-10-01: 4 mg via INTRAVENOUS
  Filled 2014-10-01: qty 2

## 2014-10-01 NOTE — ED Notes (Signed)
Pt in xray. Will obtain EKG when pt returns

## 2014-10-01 NOTE — Discharge Instructions (Signed)
Ankle Fracture A fracture is a break in a bone. The ankle joint is made up of three bones. These include the lower (distal)sections of your lower leg bones, called the tibia and fibula, along with a bone in your foot, called the talus. Depending on how bad the break is and if more than one ankle joint bone is broken, a cast or splint is used to protect and keep your injured bone from moving while it heals. Sometimes, surgery is required to help the fracture heal properly.  There are two general types of fractures:  Stable fracture. This includes a single fracture line through one bone, with no injury to ankle ligaments. A fracture of the talus that does not have any displacement (movement of the bone on either side of the fracture line) is also stable.  Unstable fracture. This includes more than one fracture line through one or more bones in the ankle joint. It also includes fractures that have displacement of the bone on either side of the fracture line. CAUSES  A direct blow to the ankle.   Quickly and severely twisting your ankle.  Trauma, such as a car accident or falling from a significant height. RISK FACTORS You may be at a higher risk of ankle fracture if:  You have certain medical conditions.  You are involved in high-impact sports.  You are involved in a high-impact car accident. SIGNS AND SYMPTOMS   Tender and swollen ankle.  Bruising around the injured ankle.  Pain on movement of the ankle.  Difficulty walking or putting weight on the ankle.  A cold foot below the site of the ankle injury. This can occur if the blood vessels passing through your injured ankle were also damaged.  Numbness in the foot below the site of the ankle injury. DIAGNOSIS  An ankle fracture is usually diagnosed with a physical exam and X-rays. A CT scan may also be required for complex fractures. TREATMENT  Stable fractures are treated with a cast or splint and using crutches to avoid putting  weight on your injured ankle. This is followed by an ankle strengthening program. Some patients require a special type of cast, depending on other medical problems they may have. Unstable fractures require surgery to ensure the bones heal properly. Your health care provider will tell you what type of fracture you have and the best treatment for your condition. HOME CARE INSTRUCTIONS   Review correct crutch use with your health care provider and use your crutches as directed. Safe use of crutches is extremely important. Misuse of crutches can cause you to fall or cause injury to nerves in your hands or armpits.  Do not put weight or pressure on the injured ankle until directed by your health care provider.  To lessen the swelling, keep the injured leg elevated while sitting or lying down.  Apply ice to the injured area:  Put ice in a plastic bag.  Place a towel between your cast and the bag.  Leave the ice on for 20 minutes, 2-3 times a day.  If you have a plaster or fiberglass cast:  Do not try to scratch the skin under the cast with any objects. This can increase your risk of skin infection.  Check the skin around the cast every day. You may put lotion on any red or sore areas.  Keep your cast dry and clean.  If you have a plaster splint:  Wear the splint as directed.  You may loosen the elastic  around the splint if your toes become numb, tingle, or turn cold or blue.  Do not put pressure on any part of your cast or splint; it may break. Rest your cast only on a pillow the first 24 hours until it is fully hardened.  Your cast or splint can be protected during bathing with a plastic bag sealed to your skin with medical tape. Do not lower the cast or splint into water.  Take medicines as directed by your health care provider. Only take over-the-counter or prescription medicines for pain, discomfort, or fever as directed by your health care provider.  Do not drive a vehicle until  your health care provider specifically tells you it is safe to do so.  If your health care provider has given you a follow-up appointment, it is very important to keep that appointment. Not keeping the appointment could result in a chronic or permanent injury, pain, and disability. If you have any problem keeping the appointment, call the facility for assistance. SEEK MEDICAL CARE IF: You develop increased swelling or discomfort. SEEK IMMEDIATE MEDICAL CARE IF:   Your cast gets damaged or breaks.  You have continued severe pain.  You develop new pain or swelling after the cast was put on.  Your skin or toenails below the injury turn blue or gray.  Your skin or toenails below the injury feel cold, numb, or have loss of sensitivity to touch.  There is a bad smell or pus draining from under the cast. MAKE SURE YOU:   Understand these instructions.  Will watch your condition.  Will get help right away if you are not doing well or get worse. Document Released: 02/07/2000 Document Revised: 02/14/2013 Document Reviewed: 09/08/2012 [facility] Patient Information 2015 [place], [place]. This information is not intended to replace advice given to you by your health care provider. Make sure you discuss any questions you have with your health care provider.   RICE: Routine Care for Injuries The routine care of many injuries includes Rest, Ice, Compression, and Elevation (RICE). HOME CARE INSTRUCTIONS  Rest is needed to allow your body to heal. Routine activities can usually be resumed when comfortable. Injured tendons and bones can take up to 6 weeks to heal. Tendons are the cord-like structures that attach muscle to bone.  Ice following an injury helps keep the swelling down and reduces pain.  Put ice in a plastic bag.  Place a towel between your skin and the bag.  Leave the ice on for 15-20 minutes, 3-4 times a day, or as directed by your health care provider. Do this while awake, for the  first 24 to 48 hours. After that, continue as directed by your caregiver.  Compression helps keep swelling down. It also gives support and helps with discomfort. If an elastic bandage has been applied, it should be removed and reapplied every 3 to 4 hours. It should not be applied tightly, but firmly enough to keep swelling down. Watch fingers or toes for swelling, bluish discoloration, coldness, numbness, or excessive pain. If any of these problems occur, remove the bandage and reapply loosely. Contact your caregiver if these problems continue.  Elevation helps reduce swelling and decreases pain. With extremities, such as the arms, hands, legs, and feet, the injured area should be placed near or above the level of the heart, if possible. SEEK IMMEDIATE MEDICAL CARE IF:  You have persistent pain and swelling.  You develop redness, numbness, or unexpected weakness.  Your symptoms are getting worse  rather than improving after several days. These symptoms may indicate that further evaluation or further X-rays are needed. Sometimes, X-rays may not show a small broken bone (fracture) until 1 week or 10 days later. Make a follow-up appointment with your caregiver. Ask when your X-ray results will be ready. Make sure you get your X-ray results. Document Released: 05/24/2000 Document Revised: 02/14/2013 Document Reviewed: 07/11/2010 [facility] Patient Information 2015 [place], [place]. This information is not intended to replace advice given to you by your health care provider. Make sure you discuss any questions you have with your health care provider.   Urinary Tract Infection Urinary tract infections (UTIs) can develop anywhere along your urinary tract. Your urinary tract is your body's drainage system for removing wastes and extra water. Your urinary tract includes two kidneys, two ureters, a bladder, and a urethra. Your kidneys are a pair of bean-shaped organs. Each kidney is about the size of your fist.  They are located below your ribs, one on each side of your spine. CAUSES Infections are caused by microbes, which are microscopic organisms, including fungi, viruses, and bacteria. These organisms are so small that they can only be seen through a microscope. Bacteria are the microbes that most commonly cause UTIs. SYMPTOMS  Symptoms of UTIs may vary by age and gender of the patient and by the location of the infection. Symptoms in young women typically include a frequent and intense urge to urinate and a painful, burning feeling in the bladder or urethra during urination. Older women and men are more likely to be tired, shaky, and weak and have muscle aches and abdominal pain. A fever may mean the infection is in your kidneys. Other symptoms of a kidney infection include pain in your back or sides below the ribs, nausea, and vomiting. DIAGNOSIS To diagnose a UTI, your caregiver will ask you about your symptoms. Your caregiver also will ask to provide a urine sample. The urine sample will be tested for bacteria and white blood cells. White blood cells are made by your body to help fight infection. TREATMENT  Typically, UTIs can be treated with medication. Because most UTIs are caused by a bacterial infection, they usually can be treated with the use of antibiotics. The choice of antibiotic and length of treatment depend on your symptoms and the type of bacteria causing your infection. HOME CARE INSTRUCTIONS  If you were prescribed antibiotics, take them exactly as your caregiver instructs you. Finish the medication even if you feel better after you have only taken some of the medication.  Drink enough water and fluids to keep your urine clear or pale yellow.  Avoid caffeine, tea, and carbonated beverages. They tend to irritate your bladder.  Empty your bladder often. Avoid holding urine for long periods of time.  Empty your bladder before and after sexual intercourse.  After a bowel movement,  women should cleanse from front to back. Use each tissue only once. SEEK MEDICAL CARE IF:   You have back pain.  You develop a fever.  Your symptoms do not begin to resolve within 3 days. SEEK IMMEDIATE MEDICAL CARE IF:   You have severe back pain or lower abdominal pain.  You develop chills.  You have nausea or vomiting.  You have continued burning or discomfort with urination. MAKE SURE YOU:   Understand these instructions.  Will watch your condition.  Will get help right away if you are not doing well or get worse. Document Released: 11/19/2004 Document Revised: 08/11/2011 Document  Reviewed: 03/20/2011 [employer] Patient Information 2015 [place], [place]. This information is not intended to replace advice given to you by your health care provider. Make sure you discuss any questions you have with your health care provider.   Fall Prevention and Home Safety Falls cause injuries and can affect all age groups. It is possible to use preventive measures to significantly decrease the likelihood of falls. There are many simple measures which can make your home safer and prevent falls. OUTDOORS  Repair cracks and edges of walkways and driveways.  Remove high doorway thresholds.  Trim shrubbery on the main path into your home.  Have good outside lighting.  Clear walkways of tools, rocks, debris, and clutter.  Check that handrails are not broken and are securely fastened. Both sides of steps should have handrails.  Have leaves, snow, and ice cleared regularly.  Use sand or salt on walkways during winter months.  In the garage, clean up grease or oil spills. BATHROOM  Install night lights.  Install grab bars by the toilet and in the tub and shower.  Use non-skid mats or decals in the tub or shower.  Place a plastic non-slip stool in the shower to sit on, if needed.  Keep floors dry and clean up all water on the floor immediately.  Remove soap buildup in the tub or  shower on a regular basis.  Secure bath mats with non-slip, double-sided rug tape.  Remove throw rugs and tripping hazards from the floors. BEDROOMS  Install night lights.  Make sure a bedside light is easy to reach.  Do not use oversized bedding.  Keep a telephone by your bedside.  Have a firm chair with side arms to use for getting dressed.  Remove throw rugs and tripping hazards from the floor. KITCHEN  Keep handles on pots and pans turned toward the center of the stove. Use back burners when possible.  Clean up spills quickly and allow time for drying.  Avoid walking on wet floors.  Avoid hot utensils and knives.  Position shelves so they are not too high or low.  Place commonly used objects within easy reach.  If necessary, use a sturdy step stool with a grab bar when reaching.  Keep electrical cables out of the way.  Do not use floor polish or wax that makes floors slippery. If you must use wax, use non-skid floor wax.  Remove throw rugs and tripping hazards from the floor. STAIRWAYS  Never leave objects on stairs.  Place handrails on both sides of stairways and use them. Fix any loose handrails. Make sure handrails on both sides of the stairways are as long as the stairs.  Check carpeting to make sure it is firmly attached along stairs. Make repairs to worn or loose carpet promptly.  Avoid placing throw rugs at the top or bottom of stairways, or properly secure the rug with carpet tape to prevent slippage. Get rid of throw rugs, if possible.  Have an electrician put in a light switch at the top and bottom of the stairs. OTHER FALL PREVENTION TIPS  Wear low-heel or rubber-soled shoes that are supportive and fit well. Wear closed toe shoes.  When using a stepladder, make sure it is fully opened and both spreaders are firmly locked. Do not climb a closed stepladder.  Add color or contrast paint or tape to grab bars and handrails in your home. Place  contrasting color strips on first and last steps.  Learn and use mobility aids as  needed. Install an electrical emergency response system.  Turn on lights to avoid dark areas. Replace light bulbs that burn out immediately. Get light switches that glow.  Arrange furniture to create clear pathways. Keep furniture in the same place.  Firmly attach carpet with non-skid or double-sided tape.  Eliminate uneven floor surfaces.  Select a carpet pattern that does not visually hide the edge of steps.  Be aware of all pets. OTHER HOME SAFETY TIPS  Set the water temperature for 120 F (48.8 C).  Keep emergency numbers on or near the telephone.  Keep smoke detectors on every level of the home and near sleeping areas. Document Released: 01/30/2002 Document Revised: 08/11/2011 Document Reviewed: 05/01/2011 [facility] Patient Information 2015 [place], [place]. This information is not intended to replace advice given to you by your health care provider. Make sure you discuss any questions you have with your health care provider.

## 2014-10-01 NOTE — ED Provider Notes (Signed)
I received this patient in signout from Dr. Elesa Massed. She had been diagnosed with right ankle fracture and was awaiting social work and case management evaluation to determine disposition plan. Social work and case management spent extensive time with the patient and her daughter. Because of the patient's insurance status, she is not eligible for a rehabilitation facility at this time. Her daughter is currently working to obtain resources. See social work and case management notes for complete details. Patient does live with daughter and they feel comfortable with discharge home while these plans are pending. Patient discharged in satisfactory condition.  Laurence Spates, MD 10/01/14 1739

## 2014-10-01 NOTE — Progress Notes (Signed)
ED Cm contacted to answer further questions from Hilda Lias, Daughter about blue medicare She has called to discontinue [employer] and to change to [employer] States first health remains active until 10/23/14 and will get blue medicare active 10/25/14 She will contact Cm back to see if further assist needed with orders for home health if a pcp is not obtained.  Cm left Office number with Hilda Lias.  Again discussed [facility] may be contacting pt and her to attempt to assist with pcp services in transition to coverage changes Elfrieda voiced appreciation of resources and services provided

## 2014-10-01 NOTE — ED Notes (Signed)
Patient was repositioned and cleaned up.

## 2014-10-01 NOTE — Progress Notes (Signed)
1330 CM contacted by Dr Clarene Duke Cm provided and updated on pt home services options and Merla's assistance with changing of coverage to allow pt to get further services in future.  Informed Dr Clarene Duke pt lives with [name] and her family

## 2014-10-01 NOTE — Progress Notes (Addendum)
1207  First goal for pt is for pt and family to obtain, clarify her coverage in [place] otherwise all services (pcp, home health, facility placement will be self pay or out of pocket expense) Daughter encouraged to go to DSS and Social security offices Hally is assisting to get insurance plans corrected.  Confirms pt has been in [place] since January 2016 and pt has walker, cane, bedside commode, etc already in the home No DME needed per Hilda Lias ED RN, Fleet Contras updated Pt to be discharged  [facility] confirmed pt is not active for home health services. Insurance coverage [place] and [place] are out of network.  Pt was offered [facility] uninsured coverage but family voiced they were not able to pay the uninsured costs for home services via [facility].  Cm offered Hilda Lias and pt Therapist, nutritional and [place] uninsured Resources Pt does not qualify for [employer] T[facility] services (only 2 chs ED visits in last 6 months no admissions) nor [facility] services (no [facility] pcp) CM spoke with pt who confirms uninsured [employer] resident with no pcp.  CM discussed and provided written information for uninsured accepting pcps, discussed the importance of pcp vs EDP services for f/u care, www.needymeds.org, www.goodrx.com, discounted pharmacies and other [employer] such as [employer] , [employer], affordable care act, financial assistance, uninsured dental services, [place] med assist, DSS and  health department  Reviewed resources for [employer] uninsured accepting pcps like [facility], family medicine at [employer], [facility], [facility], local urgent care centers, [facility], [facility] family practice, general medical clinics, family services of the [place], [facility] urgent care plus others, medication resources, [facility] out patient pharmacies and housing Pt voiced understanding and appreciation of resources provided   Provided [facility] contact information Pt agreed to referral to [facility]  CM sent a referral to [facility] Pending contact to pt from [facility] with any possible available services for pt    1124 Cm spoke with [place] at [phone] to find out pt is not in [employer], DOB, and name.   ED registration unable to find pt with traditional medicare coverage 1044 CM spoke with pt and daughter. Ryelee, about pt home health services via [facility].   Aretta informed by [facility] that services ordered are not covered by her insurance. Reports Advanced advised them of the out of pocket costs for PT as $175 as uninsured until [place] becomes active. CM unable to offer another resource until pt coverage is active  Pt and daughter applied for medicaid change to [facility] medicaid on 09/24/14 Pending coverage Has a case worker at local DSS but not sure of the name of case worker Pt lives in home with Zenith (pregnant), toddler and working son in Social worker.  Cm, pt, daughter and ED SW spoke about possible options.  No available paid services available until medicaid coverage changed to [place] medicaid.  All options are out of pocket to include home health, ALF, PDN services.  Perel voiced concern with pt going home after 6 falls. Pt is alert and oriented to person, place and time Noted confused with thinking she is in [place] vs [place]inette Otto (has family in [place]) Able to state her name,she is in hospital in [place], [place], on Monday,  august 2016 and the president is "obama" Floy confirms pt had home services in [place] from "nine  French Ana is the case worker name found after Hilda Lias  called her husband prior to him leaving for work CM reviewed EPIC labs and imaging Not noting an admission reason  CM reviewed in details medicare guidelines, home health [facility]) (length of stay in home, types of [facility] staff available, coverage, primary caregiver, up to 24 hrs before services may be started) and Private duty nursing (PDN-coverage, length of stay in the home types of staff available). CM reviewed  availability of HH SW to assist pcp to get pt to snf (if desired disposition) from the community level. CM provided pt/family with a list of [place] home health agencies and PDN.   [phone] This patient was seen on September 21, 2014 and set it with [facility] PT, OT, aide through [facility] that was confirmed active by the [facility] ED pm.  on September 24 2014

## 2014-10-01 NOTE — ED Notes (Signed)
Weekend Child psychotherapist called at [phone]. A voice message and a number to be reached was left on the voice mail.

## 2014-10-01 NOTE — ED Notes (Signed)
Per Md Patient is to stay until social work gets here to gets services for patient at home.

## 2014-10-02 NOTE — Care Management Note (Signed)
CM left a voice message for Leslie Farley to inform her about FL2 at [phone] Cm left Cm office number

## 2014-10-02 NOTE — Progress Notes (Addendum)
CM called pt's home number and Natalie answered and referred CM to [facility], at [phone]. Cm called. Shayona did not answer. Cm left a voice message updating her that [facility] states pt is not a candidate because of her medicare eligibility and [facility] is still available to assist at the price quoted for physical therapy services.  Rufus states "she is not doing anything since she has been home, not moving"  Cyniah states her sister is willing to take "out a loan" to [place]  Chancie reports speaking with [facility] who requests a return call when loan approved.  Liah reports DSS has all paperwork and she has been today initiating the [employer]. Saphyra inquired about what would be needed for [place] placement Informed her pt would still need an FL2 and  PASRR. Inquired of assistance for this CM called ED SW and discussed Cm found EDP, R Little to discuss assist with FL2 EDP agreed to assist  Pending process

## 2014-10-03 LAB — URINE CULTURE: CULTURE: NO GROWTH

## 2014-10-26 NOTE — Progress Notes (Signed)
ED Cm received 2 calls on 10/25/14 left in Cm office voice message from 10/23/14 & 10/24/14 from pt's Daughter, Trinitey ([place] SW) inquiring how to obtain a pasrr for pt to be placed in [place] snf ED CM returned a call to [place] on 10/25/14 at number left and a voice message was left indicating what the [place] pasrr is for, ncmust website, need to have access to  must, chs social workers complete this tasks for chs pts and encouraged her to seek assist from [employer] sw ED CM spoke with ED SW about this on 10/25/14

## 2014-10-30 ENCOUNTER — Encounter (HOSPITAL_COMMUNITY): Payer: Self-pay | Admitting: Emergency Medicine

## 2014-10-30 ENCOUNTER — Inpatient Hospital Stay (HOSPITAL_COMMUNITY)
Admission: EM | Admit: 2014-10-30 | Discharge: 2014-11-02 | DRG: 175 | Disposition: A | Discharge status: Skilled Nursing Facility | Payer: Medicare Other | Attending: Internal Medicine | Admitting: Internal Medicine

## 2014-10-30 ENCOUNTER — Emergency Department (HOSPITAL_COMMUNITY): Payer: Medicare Other

## 2014-10-30 DIAGNOSIS — S82891A Other fracture of right lower leg, initial encounter for closed fracture: Secondary | ICD-10-CM | POA: Diagnosis present

## 2014-10-30 DIAGNOSIS — J9610 Chronic respiratory failure, unspecified whether with hypoxia or hypercapnia: Secondary | ICD-10-CM | POA: Diagnosis not present

## 2014-10-30 DIAGNOSIS — Z79899 Other long term (current) drug therapy: Secondary | ICD-10-CM

## 2014-10-30 DIAGNOSIS — J80 Acute respiratory distress syndrome: Secondary | ICD-10-CM | POA: Diagnosis not present

## 2014-10-30 DIAGNOSIS — J42 Unspecified chronic bronchitis: Secondary | ICD-10-CM

## 2014-10-30 DIAGNOSIS — Z7901 Long term (current) use of anticoagulants: Secondary | ICD-10-CM | POA: Diagnosis not present

## 2014-10-30 DIAGNOSIS — M1990 Unspecified osteoarthritis, unspecified site: Secondary | ICD-10-CM | POA: Diagnosis not present

## 2014-10-30 DIAGNOSIS — K219 Gastro-esophageal reflux disease without esophagitis: Secondary | ICD-10-CM | POA: Diagnosis present

## 2014-10-30 DIAGNOSIS — E041 Nontoxic single thyroid nodule: Secondary | ICD-10-CM | POA: Diagnosis not present

## 2014-10-30 DIAGNOSIS — J449 Chronic obstructive pulmonary disease, unspecified: Secondary | ICD-10-CM | POA: Insufficient documentation

## 2014-10-30 DIAGNOSIS — J9620 Acute and chronic respiratory failure, unspecified whether with hypoxia or hypercapnia: Secondary | ICD-10-CM | POA: Diagnosis not present

## 2014-10-30 DIAGNOSIS — S82831A Other fracture of upper and lower end of right fibula, initial encounter for closed fracture: Secondary | ICD-10-CM | POA: Diagnosis not present

## 2014-10-30 DIAGNOSIS — S8261XD Displaced fracture of lateral malleolus of right fibula, subsequent encounter for closed fracture with routine healing: Secondary | ICD-10-CM | POA: Diagnosis not present

## 2014-10-30 DIAGNOSIS — G9340 Encephalopathy, unspecified: Secondary | ICD-10-CM | POA: Diagnosis present

## 2014-10-30 DIAGNOSIS — Z9181 History of falling: Secondary | ICD-10-CM | POA: Diagnosis not present

## 2014-10-30 DIAGNOSIS — E079 Disorder of thyroid, unspecified: Secondary | ICD-10-CM | POA: Diagnosis not present

## 2014-10-30 DIAGNOSIS — I10 Essential (primary) hypertension: Secondary | ICD-10-CM | POA: Diagnosis present

## 2014-10-30 DIAGNOSIS — Z79891 Long term (current) use of opiate analgesic: Secondary | ICD-10-CM | POA: Diagnosis not present

## 2014-10-30 DIAGNOSIS — S82401A Unspecified fracture of shaft of right fibula, initial encounter for closed fracture: Secondary | ICD-10-CM | POA: Diagnosis present

## 2014-10-30 DIAGNOSIS — J441 Chronic obstructive pulmonary disease with (acute) exacerbation: Secondary | ICD-10-CM | POA: Diagnosis present

## 2014-10-30 DIAGNOSIS — I2699 Other pulmonary embolism without acute cor pulmonale: Secondary | ICD-10-CM | POA: Diagnosis not present

## 2014-10-30 DIAGNOSIS — J4520 Mild intermittent asthma, uncomplicated: Secondary | ICD-10-CM

## 2014-10-30 DIAGNOSIS — J4521 Mild intermittent asthma with (acute) exacerbation: Secondary | ICD-10-CM | POA: Diagnosis present

## 2014-10-30 DIAGNOSIS — J45909 Unspecified asthma, uncomplicated: Secondary | ICD-10-CM | POA: Diagnosis present

## 2014-10-30 DIAGNOSIS — T148XXA Other injury of unspecified body region, initial encounter: Secondary | ICD-10-CM

## 2014-10-30 DIAGNOSIS — S82891D Other fracture of right lower leg, subsequent encounter for closed fracture with routine healing: Secondary | ICD-10-CM | POA: Diagnosis not present

## 2014-10-30 DIAGNOSIS — S82891S Other fracture of right lower leg, sequela: Secondary | ICD-10-CM | POA: Diagnosis not present

## 2014-10-30 DIAGNOSIS — M6281 Muscle weakness (generalized): Secondary | ICD-10-CM | POA: Diagnosis not present

## 2014-10-30 DIAGNOSIS — F801 Expressive language disorder: Secondary | ICD-10-CM | POA: Diagnosis not present

## 2014-10-30 DIAGNOSIS — I824Z9 Acute embolism and thrombosis of unspecified deep veins of unspecified distal lower extremity: Secondary | ICD-10-CM | POA: Diagnosis present

## 2014-10-30 DIAGNOSIS — R0602 Shortness of breath: Secondary | ICD-10-CM | POA: Diagnosis not present

## 2014-10-30 LAB — PROTIME-INR
INR: 1.09 (ref 0.00–1.49)
PROTHROMBIN TIME: 14.3 s (ref 11.6–15.2)

## 2014-10-30 LAB — CBC WITH DIFFERENTIAL/PLATELET
BASOS ABS: 0 10*3/uL (ref 0.0–0.1)
BASOS PCT: 0 % (ref 0–1)
EOS ABS: 0.3 10*3/uL (ref 0.0–0.7)
EOS PCT: 3 % (ref 0–5)
HCT: 39.6 % (ref 36.0–46.0)
HEMOGLOBIN: 13.3 g/dL (ref 12.0–15.0)
LYMPHS ABS: 2.6 10*3/uL (ref 0.7–4.0)
Lymphocytes Relative: 34 % (ref 12–46)
MCH: 31 pg (ref 26.0–34.0)
MCHC: 33.6 g/dL (ref 30.0–36.0)
MCV: 92.3 fL (ref 78.0–100.0)
Monocytes Absolute: 0.4 10*3/uL (ref 0.1–1.0)
Monocytes Relative: 5 % (ref 3–12)
NEUTROS PCT: 58 % (ref 43–77)
Neutro Abs: 4.4 10*3/uL (ref 1.7–7.7)
PLATELETS: 211 10*3/uL (ref 150–400)
RBC: 4.29 MIL/uL (ref 3.87–5.11)
RDW: 14.4 % (ref 11.5–15.5)
WBC: 7.7 10*3/uL (ref 4.0–10.5)

## 2014-10-30 LAB — BASIC METABOLIC PANEL
Anion gap: 8 (ref 5–15)
BUN: 13 mg/dL (ref 6–20)
CO2: 28 mmol/L (ref 22–32)
Calcium: 10.3 mg/dL (ref 8.9–10.3)
Chloride: 103 mmol/L (ref 101–111)
Creatinine, Ser: 0.84 mg/dL (ref 0.44–1.00)
GFR calc Af Amer: >60 mL/min (ref >60)
Glucose, Bld: 116 mg/dL — ABNORMAL HIGH (ref 65–99)
Potassium: 3.6 mmol/L (ref 3.5–5.1)
SODIUM: 139 mmol/L (ref 135–145)

## 2014-10-30 LAB — I-STAT TROPONIN, ED: Troponin i, poc: 0.01 ng/mL (ref 0.00–0.08)

## 2014-10-30 LAB — APTT: aPTT: 28 seconds (ref 24–37)

## 2014-10-30 LAB — BRAIN NATRIURETIC PEPTIDE: B Natriuretic Peptide: 41.6 pg/mL (ref 0.0–100.0)

## 2014-10-30 MED ORDER — HEPARIN BOLUS VIA INFUSION
4000.0000 [IU] | Freq: Once | INTRAVENOUS | Status: AC
  Administered 2014-10-30: 4000 [IU] via INTRAVENOUS
  Filled 2014-10-30: qty 4000

## 2014-10-30 MED ORDER — PREDNISONE 50 MG PO TABS
50.0000 mg | ORAL_TABLET | Freq: Every day | ORAL | Status: DC
  Administered 2014-10-31 – 2014-11-02 (×3): 50 mg via ORAL
  Filled 2014-10-30 (×4): qty 1

## 2014-10-30 MED ORDER — ACETAMINOPHEN 325 MG PO TABS
650.0000 mg | ORAL_TABLET | Freq: Four times a day (QID) | ORAL | Status: DC | PRN
  Administered 2014-11-01 – 2014-11-02 (×2): 650 mg via ORAL
  Filled 2014-10-30 (×2): qty 2

## 2014-10-30 MED ORDER — DM-GUAIFENESIN ER 30-600 MG PO TB12
1.0000 | ORAL_TABLET | Freq: Two times a day (BID) | ORAL | Status: DC
Start: 2014-10-30 — End: 2014-11-02
  Administered 2014-10-30 – 2014-11-02 (×6): 1 via ORAL
  Filled 2014-10-30 (×7): qty 1

## 2014-10-30 MED ORDER — OXYCODONE-ACETAMINOPHEN 5-325 MG PO TABS
1.0000 | ORAL_TABLET | ORAL | Status: DC | PRN
  Administered 2014-10-31 (×2): 1 via ORAL
  Filled 2014-10-30 (×2): qty 1

## 2014-10-30 MED ORDER — PANTOPRAZOLE SODIUM 40 MG PO TBEC
40.0000 mg | DELAYED_RELEASE_TABLET | Freq: Every day | ORAL | Status: DC
  Administered 2014-10-31 – 2014-11-02 (×3): 40 mg via ORAL
  Filled 2014-10-30 (×4): qty 1

## 2014-10-30 MED ORDER — IOHEXOL 350 MG/ML SOLN
100.0000 mL | Freq: Once | INTRAVENOUS | Status: AC | PRN
  Administered 2014-10-30: 100 mL via INTRAVENOUS

## 2014-10-30 MED ORDER — IRBESARTAN 75 MG PO TABS
75.0000 mg | ORAL_TABLET | Freq: Every day | ORAL | Status: DC
  Administered 2014-10-31 – 2014-11-02 (×3): 75 mg via ORAL
  Filled 2014-10-30 (×3): qty 1

## 2014-10-30 MED ORDER — HEPARIN (PORCINE) IN NACL 100-0.45 UNIT/ML-% IJ SOLN
1250.0000 [IU]/h | INTRAMUSCULAR | Status: DC
  Administered 2014-10-30: 1250 [IU]/h via INTRAVENOUS
  Filled 2014-10-30 (×2): qty 250

## 2014-10-30 MED ORDER — IPRATROPIUM-ALBUTEROL 0.5-2.5 (3) MG/3ML IN SOLN
3.0000 mL | RESPIRATORY_TRACT | Status: DC
  Administered 2014-10-30: 3 mL via RESPIRATORY_TRACT
  Filled 2014-10-30: qty 3

## 2014-10-30 MED ORDER — ONDANSETRON HCL 4 MG PO TABS: 4.0000 mg | ORAL_TABLET | Freq: Four times a day (QID) | ORAL | Status: DC | PRN

## 2014-10-30 MED ORDER — MOMETASONE FURO-FORMOTEROL FUM 200-5 MCG/ACT IN AERO
2.0000 | INHALATION_SPRAY | Freq: Two times a day (BID) | RESPIRATORY_TRACT | Status: DC
  Administered 2014-10-31 – 2014-11-02 (×5): 2 via RESPIRATORY_TRACT
  Filled 2014-10-30: qty 8.8

## 2014-10-30 MED ORDER — SODIUM CHLORIDE 0.9 % IV SOLN
INTRAVENOUS | Status: DC
  Administered 2014-10-31 – 2014-11-02 (×5): via INTRAVENOUS

## 2014-10-30 MED ORDER — PREDNISONE 20 MG PO TABS
60.0000 mg | ORAL_TABLET | Freq: Once | ORAL | Status: AC
  Administered 2014-10-30: 60 mg via ORAL
  Filled 2014-10-30: qty 3

## 2014-10-30 MED ORDER — HYDRALAZINE HCL 20 MG/ML IJ SOLN
5.0000 mg | INTRAMUSCULAR | Status: DC | PRN
  Administered 2014-10-31: 5 mg via INTRAVENOUS

## 2014-10-30 MED ORDER — ALBUTEROL SULFATE (2.5 MG/3ML) 0.083% IN NEBU: 2.5000 mg | INHALATION_SOLUTION | RESPIRATORY_TRACT | Status: DC | PRN

## 2014-10-30 MED ORDER — MONTELUKAST SODIUM 10 MG PO TABS
10.0000 mg | ORAL_TABLET | Freq: Every day | ORAL | Status: DC
  Administered 2014-10-30 – 2014-11-01 (×3): 10 mg via ORAL
  Filled 2014-10-30 (×4): qty 1

## 2014-10-30 MED ORDER — HYDRALAZINE HCL 20 MG/ML IJ SOLN
5.0000 mg | Freq: Once | INTRAMUSCULAR | Status: DC
  Filled 2014-10-30: qty 1

## 2014-10-30 MED ORDER — IPRATROPIUM-ALBUTEROL 0.5-2.5 (3) MG/3ML IN SOLN
3.0000 mL | Freq: Three times a day (TID) | RESPIRATORY_TRACT | Status: DC
  Administered 2014-10-31 – 2014-11-02 (×7): 3 mL via RESPIRATORY_TRACT
  Filled 2014-10-30 (×7): qty 3

## 2014-10-30 MED ORDER — SODIUM CHLORIDE 0.9 % IJ SOLN: 3.0000 mL | Freq: Two times a day (BID) | INTRAMUSCULAR | Status: DC

## 2014-10-30 MED ORDER — HYDROCHLOROTHIAZIDE 12.5 MG PO CAPS
12.5000 mg | ORAL_CAPSULE | Freq: Every day | ORAL | Status: DC
  Administered 2014-10-31 – 2014-11-02 (×3): 12.5 mg via ORAL
  Filled 2014-10-30 (×3): qty 1

## 2014-10-30 MED ORDER — DOCUSATE SODIUM 100 MG PO CAPS
100.0000 mg | ORAL_CAPSULE | Freq: Two times a day (BID) | ORAL | Status: DC
  Administered 2014-10-30 – 2014-11-02 (×6): 100 mg via ORAL
  Filled 2014-10-30 (×9): qty 1

## 2014-10-30 MED ORDER — INFLUENZA VAC SPLIT QUAD 0.5 ML IM SUSY
0.5000 mL | PREFILLED_SYRINGE | INTRAMUSCULAR | Status: AC
  Administered 2014-10-31: 0.5 mL via INTRAMUSCULAR
  Filled 2014-10-30 (×2): qty 0.5

## 2014-10-30 MED ORDER — ALBUTEROL SULFATE (2.5 MG/3ML) 0.083% IN NEBU: 2.5000 mg | INHALATION_SOLUTION | Freq: Four times a day (QID) | RESPIRATORY_TRACT | Status: DC | PRN

## 2014-10-30 MED ORDER — ALBUTEROL SULFATE (2.5 MG/3ML) 0.083% IN NEBU
2.5000 mg | INHALATION_SOLUTION | Freq: Once | RESPIRATORY_TRACT | Status: AC
  Administered 2014-10-30: 2.5 mg via RESPIRATORY_TRACT
  Filled 2014-10-30: qty 3

## 2014-10-30 MED ORDER — ONDANSETRON HCL 4 MG/2ML IJ SOLN: 4.0000 mg | Freq: Four times a day (QID) | INTRAMUSCULAR | Status: DC | PRN

## 2014-10-30 NOTE — H&P (Signed)
[facility] History and Physical  Davita Sublett ZOX:[id number] DOB: 06-06-1930 DOA: 10/30/2014  Referring physician: ED physician PCP: No primary care provider on file.  Specialists:   Chief Complaint: Shortness of breath  HPI: Leslie Farley is a 81 y.o. female with PMH of hypertension, GERD, COPD, asthma, arthritis, recent right ankle fracture, who presents with short distress.  Patient states that she has mild shortness of breath due to COPD and asthma and a baseline, which has been worsening during the past 7 days. She has mild nonproductive cough, but no chest pain, fever or chills. Of note, she had right ankle fracture last month, which limited her movement. She does not have tenderness over calf area. Patient does not have abdominal pain, diarrhea, symptoms of UTI, unilateral weakness.  In ED, patient was found to have low burden acute subsegmental pulmonary embolism without CT evidence of pulmonary arterial hypertension or right heart strain. WBC 7.7, troponin negative, BNP 41.6, temperature normal, no tachycardia, electrolytes okay. Patient is admitted to inpatient for further evaluation and treatment.  Where does patient live?   At home    Can patient participate in ADLs?  Some   Review of Systems:   General: no fevers, chills, no changes in body weight, has fatigue HEENT: no blurry vision, hearing changes or sore throat Pulm: has dyspnea, coughing, wheezing CV: no chest pain, palpitations Abd: no nausea, vomiting, abdominal pain, diarrhea, constipation GU: no dysuria, burning on urination, increased urinary frequency, hematuria  Ext: no leg edema. Has mild pain over right ankle due to fracure Neuro: no unilateral weakness, numbness, or tingling, no vision change or hearing loss Skin: no rash MSK: No muscle spasm, no deformity, no limitation of range of movement in spin Heme: No easy bruising.  Travel history: No recent long distant travel.  Allergy: No Known  Allergies  Past Medical History  Diagnosis Date  . COPD (chronic obstructive pulmonary disease)   . Asthma   . Hypertension   . GERD (gastroesophageal reflux disease)   . Arthritis     Past Surgical History  Procedure Laterality Date  . Cholecystectomy      Social History:  reports that she quit smoking about 43 years ago. She has never used smokeless tobacco. She reports that she does not drink alcohol or use illicit drugs.  Family History:  Family History  Problem Relation Age of Onset  . Cancer - Other Mother     Died of throat cancer  . Cancer - Prostate Father   . Diabetes Brother      Prior to Admission medications   Medication Sig Start Date End Date Taking? Authorizing Provider  acetaminophen (TYLENOL) 500 MG tablet Take 500-1,000 mg by mouth every 6 (six) hours as needed for mild pain or moderate pain.   Yes Historical Provider, MD  albuterol (PROVENTIL HFA;VENTOLIN HFA) 108 (90 BASE) MCG/ACT inhaler Inhale 1-2 puffs into the lungs every 6 (six) hours as needed for wheezing or shortness of breath.   Yes Historical Provider, MD  albuterol (PROVENTIL) (2.5 MG/3ML) 0.083% nebulizer solution Take 2.5 mg by nebulization every 6 (six) hours as needed for wheezing or shortness of breath.   Yes Historical Provider, MD  esomeprazole (NEXIUM) 40 MG capsule Take 40 mg by mouth daily at 12 noon.   Yes Historical Provider, MD  Fluticasone-Salmeterol (ADVAIR) 500-50 MCG/DOSE AEPB Inhale 1 puff into the lungs 2 (two) times daily.   Yes Historical Provider, MD  hydrochlorothiazide (MICROZIDE) 12.5 MG capsule Take 12.5 mg  by mouth daily.   Yes Historical Provider, MD  montelukast (SINGULAIR) 10 MG tablet Take 10 mg by mouth at bedtime.   Yes Historical Provider, MD  valsartan (DIOVAN) 80 MG tablet Take 80 mg by mouth daily.   Yes Historical Provider, MD  cephALEXin (KEFLEX) 500 MG capsule Take 1 capsule (500 mg total) by mouth 2 (two) times daily. Patient not taking: Reported on  10/30/2014 10/01/14   Layla Maw Ward, DO  docusate sodium (COLACE) 100 MG capsule Take 1 capsule (100 mg total) by mouth every 12 (twelve) hours. Patient not taking: Reported on 10/30/2014 10/01/14   Layla Maw Ward, DO  HYDROcodone-acetaminophen (NORCO/VICODIN) 5-325 MG per tablet Take 1 tablet by mouth every 6 (six) hours as needed. Patient not taking: Reported on 10/30/2014 10/01/14   Kristen N Ward, DO  ondansetron (ZOFRAN ODT) 4 MG disintegrating tablet Take 1 tablet (4 mg total) by mouth every 8 (eight) hours as needed for nausea or vomiting. Patient not taking: Reported on 10/30/2014 10/01/14   Layla Maw Ward, DO  oxyCODONE-acetaminophen (PERCOCET/ROXICET) 5-325 MG per tablet Take 1 tablet by mouth every 8 (eight) hours as needed for severe pain. Patient not taking: Reported on 10/30/2014 09/21/14   Zadie Rhine, MD    Physical Exam: Filed Vitals:   10/30/14 1835 10/30/14 2009 10/30/14 2031 10/30/14 2032  BP: 158/64 158/63    Pulse: 85 84    Temp:  98.7 F (37.1 C)    TempSrc:  Oral    Resp: 18 15    Height:    5\' 5"  (1.651 m)  SpO2: 98% 100% 97%    General: Not in acute distress HEENT:       Eyes: PERRL, EOMI, no scleral icterus.       ENT: No discharge from the ears and nose, no pharynx injection, no tonsillar enlargement.        Neck: No JVD, no bruit, no mass felt. Heme: No neck lymph node enlargement. Cardiac: S1/S2, RRR, No murmurs, No gallops or rubs. Pulm: Has mild wheezing bilaterally. No rales or rubs. Abd: Soft, nondistended, nontender, no rebound pain, no organomegaly, BS present. Ext: No pitting leg edema bilaterally. 2+DP/PT pulse bilaterally. Has splint on R lower leg. Musculoskeletal: No joint deformities, No joint redness or warmth, no limitation of ROM in spin. Skin: No rashes.  Neuro: Alert, oriented X3, cranial nerves II-XII grossly intact, muscle strength 5/5 in all extremities, sensation to light touch intact.  Psych: Patient is not psychotic, no suicidal or hemocidal  ideation.  Labs on Admission:  Basic Metabolic Panel:  Recent Labs Lab 10/30/14 1809  NA 139  K 3.6  CL 103  CO2 28  GLUCOSE 116*  BUN 13  CREATININE 0.84  CALCIUM 10.3   Liver Function Tests: No results for input(s): AST, ALT, ALKPHOS, BILITOT, PROT, ALBUMIN in the last 168 hours. No results for input(s): LIPASE, AMYLASE in the last 168 hours. No results for input(s): AMMONIA in the last 168 hours. CBC:  Recent Labs Lab 10/30/14 1809  WBC 7.7  NEUTROABS 4.4  HGB 13.3  HCT 39.6  MCV 92.3  PLT 211   Cardiac Enzymes: No results for input(s): CKTOTAL, CKMB, CKMBINDEX, TROPONINI in the last 168 hours.  BNP (last 3 results)  Recent Labs  10/30/14 1809  BNP 41.6    ProBNP (last 3 results) No results for input(s): PROBNP in the last 8760 hours.  CBG: No results for input(s): GLUCAP in the last 168 hours.  Radiological  Exams on Admission: Dg Chest 2 View  10/30/2014   CLINICAL DATA:  Increasing shortness of breath for 1 week history of COPD  EXAM: CHEST  2 VIEW  COMPARISON:  10/01/2014  FINDINGS: Stable heart size, within normal limits. Stable mild bronchitic change. Vascular pattern normal. Lungs clear. No effusions.  IMPRESSION: No active cardiopulmonary disease.   Electronically Signed   By: Esperanza Heir M.D.   On: 10/30/2014 18:41   Ct Angio Chest Pe W/cm &/or Wo Cm  10/30/2014   CLINICAL DATA:  81 year old female with a reported history asthma, wheezing and shortness of breath. Chest pain.  EXAM: CT ANGIOGRAPHY CHEST WITH CONTRAST  TECHNIQUE: Multidetector CT imaging of the chest was performed using the standard protocol during bolus administration of intravenous contrast. Multiplanar CT image reconstructions and MIPs were obtained to evaluate the vascular anatomy.  CONTRAST:  OMNIPAQUE IOHEXOL 350 MG/ML SOLN  COMPARISON:  Chest radiograph from earlier today.  FINDINGS: Mediastinum/Nodes: Normal heart size. No dilatation of the right ventricle or right  atrium. Normal position of the interventricular septum. No contrast reflux into the IVC. No pericardial fluid/thickening. Atherosclerotic nonaneurysmal thoracic aorta. Normal caliber main pulmonary artery. There are subsegmental acute pulmonary emboli in the right lower lobe (series 10/ image 162) and possibly in the left upper lobe (10/77). No central pulmonary emboli. Hypodense 1.6 cm right thyroid lobe nodule. Normal esophagus. No axillary, mediastinal or hilar lymphadenopathy.  Lungs/Pleura: No pneumothorax. No pleural effusion. No significant pulmonary nodules, lung masses or acute consolidative airspace disease. Mild subpleural reticulation in the dependent lower lobes is likely due to hypoventilation.  Upper abdomen: Tiny hiatal hernia.  Musculoskeletal: Moth-eaten appearance of the bones, with no focal aggressive osseous lesion.  Review of the MIP images confirms the above findings.  IMPRESSION: 1. Low burden acute subsegmental pulmonary embolism. No CT evidence of pulmonary arterial hypertension or right heart strain. 2. Moth-eaten appearance of the bones, a nonspecific finding that could be due to osteopenia or infiltrative osseous process. 3. Tiny hiatal hernia. Critical Value/emergent results were called by telephone at the time of interpretation on 10/30/2014 at 7:56 pm to PA Arthor Captain, who verbally acknowledged these results.   Electronically Signed   By: Delbert Phenix M.D.   On: 10/30/2014 19:58    EKG: Independently reviewed.  Abnormal findings: [id number] pattern Assessment/Plan Principal Problem:   PE (pulmonary embolism) Active Problems:   COPD (chronic obstructive pulmonary disease)   Asthma   Hypertension   GERD (gastroesophageal reflux disease)   Arthritis   Acute on chronic respiratory failure   Closed right ankle fracture  Acute on chronic respiratory failure and PE: Patient's worsening shortness of breath is most likely caused by PE as evidenced by CTA. No evidence of right  heart straining. This is likely provoked by limitation of movement secondary to recent right ankle fracture.  -admit to tele bed -heparin drip initiated in ED -2D echocardiogram ordered -LE dopplers ordered to evaluate for DVT -pain control: When necessary Percocet  COPD and asthma: Patient has mild wheezing on auscultation, but no productive cough. Does not seem to have acute exacerbation. -Breathing treatment with DuoNeb nebulizer and albuterol when necessary -Prednisone 50 mg daily -Mucinex when necessary for cough  Hypertension: -Continue HCTZ, Diovan -Hydralazine when necessary  GERD: -Protonix  Closed right ankle fracture: x-ray on 10/01/14 showed mildly displaced oblique fracture through the distal fibula. Patient has minimal pain. No neurovascular compromise. -has posterior splint in place -will have her follow-up with orthopedics  as an outpatient. -prn percocet for pain   DVT ppx: on IV Heparin     Code Status: partial code (OK with CPR, but not with Intubation) Family Communication: Yes, patient's dughter  at bed side Disposition Plan: Admit to inpatient   Date of Service 10/30/2014    Lorretta Harp [facility] Pager [phone]  If 7PM-7AM, please contact night-coverage [email or web address] Password [facility] 10/30/2014, 8:54 PM

## 2014-10-30 NOTE — ED Notes (Signed)
Bed: ZO10 Expected date:  Expected time:  Means of arrival:  Comments: 40 F - EMS - SOB

## 2014-10-30 NOTE — Progress Notes (Signed)
ANTICOAGULATION CONSULT NOTE - Initial Consult  Pharmacy Consult for Heparin Indication: pulmonary embolus  No Known Allergies  Patient Measurements: Height:  (165.1 cm) IBW/kg (Calculated) : 57  Patient reported weight : 200 lbs (91 kg) Heparin Dosing Weight: 77 kg  Vital Signs: Temp: 98.7 F (37.1 C) (09/06 2009) Temp Source: Oral (09/06 2009) BP: 158/63 mmHg (09/06 2009) Pulse Rate: 84 (09/06 2009)  Labs:  Recent Labs  10/30/14 1809  HGB 13.3  HCT 39.6  PLT 211  CREATININE 0.84    CrCl cannot be calculated (Unknown ideal weight.).   Medical History: Past Medical History  Diagnosis Date  . COPD (chronic obstructive pulmonary disease)   . Asthma   . Hypertension   . GERD (gastroesophageal reflux disease)   . Arthritis     Medications:  Scheduled:  . heparin  4,000 Units Intravenous Once   Infusions:  . heparin      Assessment:  81 yr female with c/o shortness of breath  CTAngio shows + Pulmonary Embolism  Pharmacy consulted to dose heparin to treat PE  Patient on no oral anticoagulation PTA  No weight in EPIC at this point.  Patient reported weight of around 200 lbs  Goal of Therapy:  Heparin level 0.3-0.7 units/ml Monitor platelets by anticoagulation protocol: Yes   Plan:   Check baseline aPTT and PT/INR  Heparin 4000 unit IV bolus x 1 followed by infusion @ 1250 units/hr  F/U weight once documented in EPIC and confirm dosing  Check daily heparin level & CBC  Check heparin level 8 hr after heparin infusion started  [place] Logie, Joselyn Glassman, PharmD 10/30/2014,8:35 PM

## 2014-10-30 NOTE — ED Notes (Signed)
Called respiratory to bedside. 

## 2014-10-30 NOTE — ED Notes (Signed)
Pt is coming from home and states that she has had SOB all day. Hx of asthma. Did not take inhaler at home. Wheezing in all fields.  albuterol by fire. Duo neb given by EMS.  solumedrol given IV. Breathing better now. RFA 20g IV.

## 2014-10-30 NOTE — Clinical Social Work Note (Signed)
Clinical Social Work Assessment  Patient Details  Name: Leslie Farley MRN: [id number] Date of Birth: 01-11-1931  Date of referral:  10/30/14               Reason for consult:   (Patient expressed interest in facility. )                Permission sought to share information with:   (NONE.) Permission granted to share information::  No  Name::        Agency::     Relationship::     Contact Information:     Housing/Transportation Living arrangements for the past 2 months:  Single Family Home (Patient lives at home with her daughter. ) Source of Information:  Adult Children, Patient Patient Interpreter Needed:  None Criminal Activity/Legal Involvement Pertinent to Current Situation/Hospitalization:  No - Comment as needed Significant Relationships:  Adult Children Lives with:  Adult Children Do you feel safe going back to the place where you live?  Yes (Patient and daughter are interested in faciilty. However, daughter states that if a facility is not available they would like home health. ) Need for family participation in patient care:     Care giving concerns:  Daughter informed CSW that the patient currently lives at home in [place] with her other daughter. She states that she is concerned with the patient's R ankle. She states that patient fell x3 in August. Daughter states that family has spoken with [facility] and that they have offered a bed. However, she says that if the patient cannot go to a facility they will consider home health.   Social Worker assessment / plan:  CSW met with patient at bedside. Daughter was present.  Patient informed CSW that she does not speak great english, and that she would prefer for her daughter to talk.  Daughter confirms that patient presents to [facility] due to SOB. Daughter states that the patient needs assistance with ADL's.  She states that currently she and her sister are helping patient.   Daughter informed CSW that the patient moved from  [place] to [place] in February. Also, she states that the patient's medicaid is pending.  Employment status:    Forensic scientist:   Nurse, mental health.) PT Recommendations:    Information / Referral to community resources:   (Daughter informed CSW that family has spoken with [facility] and state that the facility has a bed for patiaent. )  Patient/Family's Response to care:  Patient and daughter are appropriate at this time. Daughter expressed interested in patient being placed to SNF.  Patient/Family's Understanding of and Emotional Response to Diagnosis, Current Treatment, and Prognosis:  Patient and family are understanding at this time.   Emotional Assessment Appearance:  Appears stated age Attitude/Demeanor/Rapport:   (Appropriate. Patient states that she does not speak  good english.) Affect (typically observed):  Appropriate Orientation:  Oriented to Self, Oriented to Place, Oriented to  Time, Oriented to Situation Alcohol / Substance use:  Not Applicable Psych involvement (Current and /or in the community):  No (Comment)  Discharge Needs  Concerns to be addressed:  Adjustment to Illness Readmission within the last 30 days:  Yes Current discharge risk:  None Barriers to Discharge:  No Barriers Identified   Bernita Buffy, LCSW 10/30/2014, 11:26 PM

## 2014-10-30 NOTE — ED Provider Notes (Signed)
CSN: [id number]     Arrival date & time 10/30/14  1725 History   First MD Initiated Contact with Patient 10/30/14 1731     Chief Complaint  Patient presents with  . Shortness of Breath     (Consider location/radiation/quality/duration/timing/severity/associated sxs/prior Treatment) Patient is a 81 y.o. female presenting with shortness of breath.  Shortness of Breath Severity:  Severe Onset quality:  Sudden Duration:  2 weeks Timing:  Intermittent Progression:  Waxing and waning Chronicity:  New Relieved by:  Nothing Worsened by:  Exertion Ineffective treatments:  None tried Associated symptoms: wheezing   Associated symptoms: no chest pain, no fever, no headaches and no vomiting   Risk factors: no hx of PE/DVT    81 yo F with a chief complaint shortness breath. Patient states this been an increasing problem over the past couple weeks. Patient recently broke her right lower leg and has been bedbound since. Shortness of breath comes and goes worsening with exertion. Denies chest pain with this. Denies diaphoresis. Denies history of blood clot. The symptoms usually get worse with breathing treatments. Patient received a breathing treatment prior to her arrival.  Past Medical History  Diagnosis Date  . COPD (chronic obstructive pulmonary disease)   . Asthma   . Hypertension   . GERD (gastroesophageal reflux disease)   . Arthritis    Past Surgical History  Procedure Laterality Date  . Cholecystectomy     Family History  Problem Relation Age of Onset  . Cancer - Other Mother     Died of throat cancer  . Cancer - Prostate Father   . Diabetes Brother    Social History  Substance Use Topics  . Smoking status: Former Smoker    Quit date: 02/24/1971  . Smokeless tobacco: Never Used  . Alcohol Use: No   OB History    No data available     Review of Systems  Constitutional: Negative for fever and chills.  HENT: Negative for congestion and rhinorrhea.   Eyes: Negative for  redness and visual disturbance.  Respiratory: Positive for shortness of breath and wheezing.   Cardiovascular: Negative for chest pain and palpitations.  Gastrointestinal: Negative for nausea and vomiting.  Genitourinary: Negative for dysuria and urgency.  Musculoskeletal: Negative for myalgias and arthralgias.  Skin: Negative for pallor and wound.  Neurological: Negative for dizziness and headaches.      Allergies  Review of patient's allergies indicates no known allergies.  Home Medications   Prior to Admission medications   Medication Sig Start Date End Date Taking? Authorizing Provider  acetaminophen (TYLENOL) 500 MG tablet Take 500-1,000 mg by mouth every 6 (six) hours as needed for mild pain or moderate pain.   Yes Historical Provider, MD  albuterol (PROVENTIL HFA;VENTOLIN HFA) 108 (90 BASE) MCG/ACT inhaler Inhale 1-2 puffs into the lungs every 6 (six) hours as needed for wheezing or shortness of breath.   Yes Historical Provider, MD  albuterol (PROVENTIL) (2.5 MG/3ML) 0.083% nebulizer solution Take 2.5 mg by nebulization every 6 (six) hours as needed for wheezing or shortness of breath.   Yes Historical Provider, MD  esomeprazole (NEXIUM) 40 MG capsule Take 40 mg by mouth daily at 12 noon.   Yes Historical Provider, MD  Fluticasone-Salmeterol (ADVAIR) 500-50 MCG/DOSE AEPB Inhale 1 puff into the lungs 2 (two) times daily.   Yes Historical Provider, MD  hydrochlorothiazide (MICROZIDE) 12.5 MG capsule Take 12.5 mg by mouth daily.   Yes Historical Provider, MD  montelukast (SINGULAIR) 10 MG  tablet Take 10 mg by mouth at bedtime.   Yes Historical Provider, MD  valsartan (DIOVAN) 80 MG tablet Take 80 mg by mouth daily.   Yes Historical Provider, MD  cephALEXin (KEFLEX) 500 MG capsule Take 1 capsule (500 mg total) by mouth 2 (two) times daily. Patient not taking: Reported on 10/30/2014 10/01/14   Layla Maw Ward, DO  docusate sodium (COLACE) 100 MG capsule Take 1 capsule (100 mg total) by  mouth every 12 (twelve) hours. Patient not taking: Reported on 10/30/2014 10/01/14   Layla Maw Ward, DO  HYDROcodone-acetaminophen (NORCO/VICODIN) 5-325 MG per tablet Take 1 tablet by mouth every 6 (six) hours as needed. Patient not taking: Reported on 10/30/2014 10/01/14   Kristen N Ward, DO  ondansetron (ZOFRAN ODT) 4 MG disintegrating tablet Take 1 tablet (4 mg total) by mouth every 8 (eight) hours as needed for nausea or vomiting. Patient not taking: Reported on 10/30/2014 10/01/14   Layla Maw Ward, DO  oxyCODONE-acetaminophen (PERCOCET/ROXICET) 5-325 MG per tablet Take 1 tablet by mouth every 8 (eight) hours as needed for severe pain. Patient not taking: Reported on 10/30/2014 09/21/14   Zadie Rhine, MD   BP 167/83 mmHg  Pulse 93  Temp(Src) 98.7 F (37.1 C) (Oral)  Resp 20  Ht  (1.651 m)  Wt 195 lb 1.6 oz (88.497 kg)  BMI 32.47 kg/m2  SpO2 100% Physical Exam  Constitutional: She is oriented to person, place, and time. She appears well-developed and well-nourished. No distress.  HENT:  Head: Normocephalic and atraumatic.  Eyes: EOM are normal. Pupils are equal, round, and reactive to light.  Neck: Normal range of motion. Neck supple.  Cardiovascular: Normal rate and regular rhythm.  Exam reveals no gallop and no friction rub.   No murmur heard. Pulmonary/Chest: Effort normal. No respiratory distress. She has no wheezes. She has no rales.  Abdominal: Soft. She exhibits no distension. There is no tenderness. There is no rebound.  Musculoskeletal: She exhibits no edema or tenderness.  Neurological: She is alert and oriented to person, place, and time.  Skin: Skin is warm and dry. She is not diaphoretic.  Psychiatric: She has a normal mood and affect. Her behavior is normal.    ED Course  Procedures (including critical care time) Labs Review Labs Reviewed  BASIC METABOLIC PANEL - Abnormal; Notable for the following:    Glucose, Bld 116 (*)    All other components within normal  limits  CBC WITH DIFFERENTIAL/PLATELET  BRAIN NATRIURETIC PEPTIDE  PROTIME-INR  APTT  CBC  HEPARIN LEVEL (UNFRACTIONATED)  COMPREHENSIVE METABOLIC PANEL  I-STAT TROPOININ, ED    Imaging Review Dg Chest 2 View  10/30/2014   CLINICAL DATA:  Increasing shortness of breath for 1 week history of COPD  EXAM: CHEST  2 VIEW  COMPARISON:  10/01/2014  FINDINGS: Stable heart size, within normal limits. Stable mild bronchitic change. Vascular pattern normal. Lungs clear. No effusions.  IMPRESSION: No active cardiopulmonary disease.   Electronically Signed   By: Esperanza Heir M.D.   On: 10/30/2014 18:41   Ct Angio Chest Pe W/cm &/or Wo Cm  10/30/2014   CLINICAL DATA:  81 year old female with a reported history asthma, wheezing and shortness of breath. Chest pain.  EXAM: CT ANGIOGRAPHY CHEST WITH CONTRAST  TECHNIQUE: Multidetector CT imaging of the chest was performed using the standard protocol during bolus administration of intravenous contrast. Multiplanar CT image reconstructions and MIPs were obtained to evaluate the vascular anatomy.  CONTRAST:  OMNIPAQUE  IOHEXOL 350 MG/ML SOLN  COMPARISON:  Chest radiograph from earlier today.  FINDINGS: Mediastinum/Nodes: Normal heart size. No dilatation of the right ventricle or right atrium. Normal position of the interventricular septum. No contrast reflux into the IVC. No pericardial fluid/thickening. Atherosclerotic nonaneurysmal thoracic aorta. Normal caliber main pulmonary artery. There are subsegmental acute pulmonary emboli in the right lower lobe (series 10/ image 162) and possibly in the left upper lobe (10/77). No central pulmonary emboli. Hypodense 1.6 cm right thyroid lobe nodule. Normal esophagus. No axillary, mediastinal or hilar lymphadenopathy.  Lungs/Pleura: No pneumothorax. No pleural effusion. No significant pulmonary nodules, lung masses or acute consolidative airspace disease. Mild subpleural reticulation in the dependent lower lobes is likely  due to hypoventilation.  Upper abdomen: Tiny hiatal hernia.  Musculoskeletal: Moth-eaten appearance of the bones, with no focal aggressive osseous lesion.  Review of the MIP images confirms the above findings.  IMPRESSION: 1. Low burden acute subsegmental pulmonary embolism. No CT evidence of pulmonary arterial hypertension or right heart strain. 2. Moth-eaten appearance of the bones, a nonspecific finding that could be due to osteopenia or infiltrative osseous process. 3. Tiny hiatal hernia. Critical Value/emergent results were called by telephone at the time of interpretation on 10/30/2014 at 7:56 pm to PA Arthor Captain, who verbally acknowledged these results.   Electronically Signed   By: Delbert Phenix M.D.   On: 10/30/2014 19:58   I have personally reviewed and evaluated these images and lab results as part of my medical decision-making.   EKG Interpretation None      MDM   Final diagnoses:  Chronic bronchitis, unspecified chronic bronchitis type  Asthma, mild intermittent, uncomplicated  Essential hypertension  Gastroesophageal reflux disease without esophagitis  Arthritis  Pulmonary embolus  81 yo F with a chief complaint shortness breath. This been an ongoing problem for the past couple weeks and she injured her leg. Concern for limited mobility at home and increased risk for blood clotting. Will obtain a CT angiogram of the chest.  Patient found to have a subsegmental pulmonary embolism. Patient continues to be symptomatic with this while on the ED. Troponin and BNP negative. Start on heparin drip will admit to the hospital. The patients results and plan were reviewed and discussed.   Any x-rays performed were independently reviewed by myself.   Differential diagnosis were considered with the presenting HPI.  Medications  heparin bolus via infusion 4,000 Units (4,000 Units Intravenous Given 10/30/14 2109)    Followed by  heparin ADULT infusion 100 units/mL (25000 units/250 mL)  (1,250 Units/hr Intravenous New Bag/Given 10/30/14 2118)  predniSONE (DELTASONE) tablet 50 mg (not administered)  acetaminophen (TYLENOL) tablet 650 mg (not administered)  docusate sodium (COLACE) capsule 100 mg (100 mg Oral Given 10/30/14 2300)  pantoprazole (PROTONIX) EC tablet 40 mg (not administered)  oxyCODONE-acetaminophen (PERCOCET/ROXICET) 5-325 MG per tablet 1 tablet (not administered)  mometasone-formoterol (DULERA) 200-5 MCG/ACT inhaler 2 puff (2 puffs Inhalation Not Given 10/30/14 2202)  irbesartan (AVAPRO) tablet 75 mg (not administered)  hydrochlorothiazide (MICROZIDE) capsule 12.5 mg (not administered)  montelukast (SINGULAIR) tablet 10 mg (10 mg Oral Given 10/30/14 2300)  dextromethorphan-guaiFENesin (MUCINEX DM) 30-600 MG per 12 hr tablet 1 tablet (1 tablet Oral Given 10/30/14 2300)  0.9 %  sodium chloride infusion (not administered)  hydrALAZINE (APRESOLINE) injection 5 mg (not administered)  Influenza vac split quadrivalent PF (FLUARIX) injection 0.5 mL (not administered)  sodium chloride 0.9 % injection 3 mL (3 mLs Intravenous Not Given 10/30/14 2200)  ondansetron (ZOFRAN)  tablet 4 mg (not administered)    Or  ondansetron (ZOFRAN) injection 4 mg (not administered)  albuterol (PROVENTIL) (2.5 MG/3ML) 0.083% nebulizer solution 2.5 mg (not administered)  ipratropium-albuterol (DUONEB) 0.5-2.5 (3) MG/3ML nebulizer solution 3 mL (not administered)  hydrALAZINE (APRESOLINE) injection 5 mg (not administered)  predniSONE (DELTASONE) tablet 60 mg (60 mg Oral Given 10/30/14 1832)  iohexol (OMNIPAQUE) 350 MG/ML injection 100 mL (100 mLs Intravenous Contrast Given 10/30/14 1917)  albuterol (PROVENTIL) (2.5 MG/3ML) 0.083% nebulizer solution 2.5 mg (2.5 mg Nebulization Given 10/30/14 2031)    Filed Vitals:   10/30/14 2031 10/30/14 2032 10/30/14 2143 10/30/14 2300  BP:   186/84 167/83  Pulse:   94 93  Temp:   98.7 F (37.1 C)   TempSrc:   Oral   Resp:   20   Height:  5\' 5"  (1.651 m) 5\' 5"  (1.651  m)   Weight:   195 lb 1.6 oz (88.497 kg)   SpO2: 97%  100%     Final diagnoses:  Chronic bronchitis, unspecified chronic bronchitis type  Asthma, mild intermittent, uncomplicated  Essential hypertension  Gastroesophageal reflux disease without esophagitis  Arthritis    Admission/ observation were discussed with the admitting physician, patient and/or family and they are comfortable with the plan.    Melene Plan, DO 10/31/14 0009

## 2014-10-31 ENCOUNTER — Ambulatory Visit (HOSPITAL_COMMUNITY): Payer: Medicare Other

## 2014-10-31 ENCOUNTER — Inpatient Hospital Stay (HOSPITAL_COMMUNITY): Payer: Medicare Other

## 2014-10-31 DIAGNOSIS — I2699 Other pulmonary embolism without acute cor pulmonale: Secondary | ICD-10-CM

## 2014-10-31 DIAGNOSIS — S82891S Other fracture of right lower leg, sequela: Secondary | ICD-10-CM

## 2014-10-31 DIAGNOSIS — I10 Essential (primary) hypertension: Secondary | ICD-10-CM

## 2014-10-31 DIAGNOSIS — J449 Chronic obstructive pulmonary disease, unspecified: Secondary | ICD-10-CM

## 2014-10-31 LAB — COMPREHENSIVE METABOLIC PANEL
ALT: 22 U/L (ref 14–54)
AST: 22 U/L (ref 15–41)
Albumin: 3.6 g/dL (ref 3.5–5.0)
Alkaline Phosphatase: 69 U/L (ref 38–126)
Anion gap: 10 (ref 5–15)
BUN: 16 mg/dL (ref 6–20)
CALCIUM: 9.7 mg/dL (ref 8.9–10.3)
CHLORIDE: 103 mmol/L (ref 101–111)
CO2: 23 mmol/L (ref 22–32)
CREATININE: 0.79 mg/dL (ref 0.44–1.00)
Glucose, Bld: 145 mg/dL — ABNORMAL HIGH (ref 65–99)
Potassium: 3.7 mmol/L (ref 3.5–5.1)
Sodium: 136 mmol/L (ref 135–145)
TOTAL PROTEIN: 6.8 g/dL (ref 6.5–8.1)
Total Bilirubin: 0.7 mg/dL (ref 0.3–1.2)

## 2014-10-31 LAB — CBC
HCT: 33.9 % — ABNORMAL LOW (ref 36.0–46.0)
HEMOGLOBIN: 11.5 g/dL — AB (ref 12.0–15.0)
MCH: 30.8 pg (ref 26.0–34.0)
MCHC: 33.9 g/dL (ref 30.0–36.0)
MCV: 90.9 fL (ref 78.0–100.0)
Platelets: 226 10*3/uL (ref 150–400)
RBC: 3.73 MIL/uL — AB (ref 3.87–5.11)
RDW: 14.3 % (ref 11.5–15.5)
WBC: 5.6 10*3/uL (ref 4.0–10.5)

## 2014-10-31 LAB — HEPARIN LEVEL (UNFRACTIONATED): HEPARIN UNFRACTIONATED: 1.76 [IU]/mL — AB (ref 0.30–0.70)

## 2014-10-31 LAB — GLUCOSE, CAPILLARY: GLUCOSE-CAPILLARY: 145 mg/dL — AB (ref 65–99)

## 2014-10-31 MED ORDER — APIXABAN 5 MG PO TABS
10.0000 mg | ORAL_TABLET | Freq: Two times a day (BID) | ORAL | Status: DC
  Administered 2014-10-31 – 2014-11-02 (×5): 10 mg via ORAL
  Filled 2014-10-31 (×6): qty 2

## 2014-10-31 MED ORDER — HEPARIN (PORCINE) IN NACL 100-0.45 UNIT/ML-% IJ SOLN
1000.0000 [IU]/h | INTRAMUSCULAR | Status: DC
  Administered 2014-10-31: 1000 [IU]/h via INTRAVENOUS
  Filled 2014-10-31: qty 250

## 2014-10-31 MED ORDER — APIXABAN 5 MG PO TABS: 5.0000 mg | ORAL_TABLET | Freq: Two times a day (BID) | ORAL | Status: DC

## 2014-10-31 NOTE — Progress Notes (Signed)
VASCULAR LAB PRELIMINARY  PRELIMINARY  PRELIMINARY  PRELIMINARY  Bilateral lower extremity venous duplex  completed.    Preliminary report:  Bilateral:  No evidence of DVT, superficial thrombosis, or Baker's Cyst.    Pietrina Jagodzinski, RVT 10/31/2014, 10:42 AM

## 2014-10-31 NOTE — Progress Notes (Signed)
ANTICOAGULATION CONSULT NOTE - Initial Consult  Pharmacy Consult for Apixaban Indication: PE  No Known Allergies  Patient Measurements: Height:  (165.1 cm) Weight: 195 lb 1.6 oz (88.497 kg) IBW/kg (Calculated) : 57 Heparin Dosing Weight:   Vital Signs: Temp: 98.3 F (36.8 C) (09/07 0522) Temp Source: Oral (09/07 0522) BP: 106/59 mmHg (09/07 0522) Pulse Rate: 83 (09/07 0522)  Labs:  Recent Labs  10/30/14 1809 10/30/14 2107 10/31/14 0515  HGB 13.3  --  11.5*  HCT 39.6  --  33.9*  PLT 211  --  226  APTT  --  28  --   LABPROT  --  14.3  --   INR  --  1.09  --   HEPARINUNFRC  --   --  1.76*  CREATININE 0.84  --  0.79    Estimated Creatinine Clearance: 60.6 mL/min (by C-G formula based on Cr of 0.79).   Medical History: Past Medical History  Diagnosis Date  . COPD (chronic obstructive pulmonary disease)   . Asthma   . Hypertension   . GERD (gastroesophageal reflux disease)   . Arthritis     Assessment: 84 yoF presents with shortness of breath found to have low burden acute subsegmental pulmonary embolism without CT evidence of pulmonary arterial hypertension or right heart strain. LE Dopplers negative for DVT.  Started on heparin infusion, now transitioning to apixaban.  Goal of Therapy:  VTE Treatment   Plan:  1.  Stop heparin infusion.   2.  Start Apixaban 10 mg BID x 7 days then switch to 5 mg BID on Wednesday, 11/07/14. 3.  Will provide patient counseling prior to discharge.  Clance Boll 10/31/2014,11:01 AM

## 2014-10-31 NOTE — Progress Notes (Signed)
[facility] PROGRESS NOTE  Cataleia Gade ZHY:[id number] DOB: 1931-02-18 DOA: 10/30/2014 PCP: No primary care provider on file.  Brief narrative 81 year old female with history of hypertension, GERD, COPD, arthritis, recent right ankle fracture (sent from ED with conservative management) presented with progressive shortness of breath for 7 days duration associated with nonproductive cough. Patient has been mostly nonambulatory following her recent right ankle fracture about a month back. In the ED patient was found to have a low burden acute subsegmental PE over the right lower lung without evidence of pulmonary arterial hypertension or right heart strain. Patient admitted for further evaluation and management.  Assessment/Plan: Acute pulmonary embolism Possibly associated with lower extremity DVT from recent right ankle fracture and in mobility. Patient started on IV heparin drip on admission. Doppler lower extremity negative for DVT. A results pending. Will switch over to Eliquis plan to treat for 6 months duration. O2 sat stable on room air.  History of COPD and asthma with mild exacerbation. Patient had mild wheezing on admission. Placed on when necessary DuoNeb and daily prednisone. Continue Mucinex for cough.  Recent right ankle fracture. Following a mechanical fall at home about 5 weeks back. (Reportedly has had multiple falls at home). X-ray showed mildly displaced oblique fracture through the distal fibula also show tiny avulsion arising from the medial malleolus. Ordered PT evaluation. Pain control with when necessary Percocet. Will order repeat x-ray of the foot to evaluate for healing  Essential hypertension Continue HCTZ and Diovan.  GERD Continue Protonix  DVT prophylaxis: eliquis Diet: Regular    Code Status: Full code Family Communication: Called daughter and left a message Disposition Plan: Pending PT evaluation.   Consultants:  None  Procedures:  2-D  echo  CT angiogram chest  Lower Extremity venous Doppler  Antibiotics:  None  HPI/Subjective: Since then examined. She can speak some english.  denies any shortness of breath or chest discomfort.  Objective: Filed Vitals:   10/31/14 1303  BP: 130/62  Pulse: 91  Temp: 98 F (36.7 C)  Resp: 20    Intake/Output Summary (Last 24 hours) at 10/31/14 1559 Last data filed at 10/31/14 1500  Gross per 24 hour  Intake 1336.67 ml  Output    201 ml  Net 1135.67 ml   Filed Weights   10/30/14 2143  Weight: 88.497 kg (195 lb 1.6 oz)    Exam:   General:  Elderly female in no acute distress  HEENT: No pallor, muscular mucosa  Chest: Clear to auscultation bilaterally  CVS: Normal S1, no murmurs rub or gallop  GI: Soft, nondistended, nontender, bowel sounds present  Musculoskeletal: Ace wrap over  right ankle  CNS: Alert and oriented  Data Reviewed: Basic Metabolic Panel:  Recent Labs Lab 10/30/14 1809 10/31/14 0515  NA 139 136  K 3.6 3.7  CL 103 103  CO2 28 23  GLUCOSE 116* 145*  BUN 13 16  CREATININE 0.84 0.79  CALCIUM 10.3 9.7   Liver Function Tests:  Recent Labs Lab 10/31/14 0515  AST 22  ALT 22  ALKPHOS 69  BILITOT 0.7  PROT 6.8  ALBUMIN 3.6   No results for input(s): LIPASE, AMYLASE in the last 168 hours. No results for input(s): AMMONIA in the last 168 hours. CBC:  Recent Labs Lab 10/30/14 1809 10/31/14 0515  WBC 7.7 5.6  NEUTROABS 4.4  --   HGB 13.3 11.5*  HCT 39.6 33.9*  MCV 92.3 90.9  PLT 211 226   Cardiac Enzymes: No results for  input(s): CKTOTAL, CKMB, CKMBINDEX, TROPONINI in the last 168 hours. BNP (last 3 results)  Recent Labs  10/30/14 1809  BNP 41.6    ProBNP (last 3 results) No results for input(s): PROBNP in the last 8760 hours.  CBG: No results for input(s): GLUCAP in the last 168 hours.  No results found for this or any previous visit (from the past 240 hour(s)).   Studies: Dg Chest 2 View  10/30/2014    CLINICAL DATA:  Increasing shortness of breath for 1 week history of COPD  EXAM: CHEST  2 VIEW  COMPARISON:  10/01/2014  FINDINGS: Stable heart size, within normal limits. Stable mild bronchitic change. Vascular pattern normal. Lungs clear. No effusions.  IMPRESSION: No active cardiopulmonary disease.   Electronically Signed   By: Esperanza Heir M.D.   On: 10/30/2014 18:41   Ct Angio Chest Pe W/cm &/or Wo Cm  10/30/2014   CLINICAL DATA:  81 year old female with a reported history asthma, wheezing and shortness of breath. Chest pain.  EXAM: CT ANGIOGRAPHY CHEST WITH CONTRAST  TECHNIQUE: Multidetector CT imaging of the chest was performed using the standard protocol during bolus administration of intravenous contrast. Multiplanar CT image reconstructions and MIPs were obtained to evaluate the vascular anatomy.  CONTRAST:  OMNIPAQUE IOHEXOL 350 MG/ML SOLN  COMPARISON:  Chest radiograph from earlier today.  FINDINGS: Mediastinum/Nodes: Normal heart size. No dilatation of the right ventricle or right atrium. Normal position of the interventricular septum. No contrast reflux into the IVC. No pericardial fluid/thickening. Atherosclerotic nonaneurysmal thoracic aorta. Normal caliber main pulmonary artery. There are subsegmental acute pulmonary emboli in the right lower lobe (series 10/ image 162) and possibly in the left upper lobe (10/77). No central pulmonary emboli. Hypodense 1.6 cm right thyroid lobe nodule. Normal esophagus. No axillary, mediastinal or hilar lymphadenopathy.  Lungs/Pleura: No pneumothorax. No pleural effusion. No significant pulmonary nodules, lung masses or acute consolidative airspace disease. Mild subpleural reticulation in the dependent lower lobes is likely due to hypoventilation.  Upper abdomen: Tiny hiatal hernia.  Musculoskeletal: Moth-eaten appearance of the bones, with no focal aggressive osseous lesion.  Review of the MIP images confirms the above findings.  IMPRESSION: 1. Low  burden acute subsegmental pulmonary embolism. No CT evidence of pulmonary arterial hypertension or right heart strain. 2. Moth-eaten appearance of the bones, a nonspecific finding that could be due to osteopenia or infiltrative osseous process. 3. Tiny hiatal hernia. Critical Value/emergent results were called by telephone at the time of interpretation on 10/30/2014 at 7:56 pm to PA Arthor Captain, who verbally acknowledged these results.   Electronically Signed   By: Delbert Phenix M.D.   On: 10/30/2014 19:58    Scheduled Meds: . apixaban  10 mg Oral BID   Followed by  . [START ON 11/07/2014] apixaban  5 mg Oral BID  . dextromethorphan-guaiFENesin  1 tablet Oral BID  . docusate sodium  100 mg Oral BID  . hydrALAZINE  5 mg Intravenous Once  . hydrochlorothiazide  12.5 mg Oral Daily  . ipratropium-albuterol  3 mL Nebulization TID  . irbesartan  75 mg Oral Daily  . mometasone-formoterol  2 puff Inhalation BID  . montelukast  10 mg Oral QHS  . pantoprazole  40 mg Oral Daily  . predniSONE  50 mg Oral Q breakfast  . sodium chloride  3 mL Intravenous Q12H   Continuous Infusions: . sodium chloride 75 mL/hr at 10/31/14 1307      Time spent: 25 minutes  Eddie North  [facility] Pager [phone] 7PM-7AM, please contact night-coverage at [email or web address], password [facility] 10/31/2014, 3:59 PM  LOS: 1 day

## 2014-10-31 NOTE — Care Management Note (Signed)
Case Management Note  Patient Details  Name: Kaytlynn Kochan MRN: [id number] Date of Birth: 08/08/1930  Subjective/Objective: 81 y/o f admitted w/PE. From home.                   Action/Plan:d/c plan home.   Expected Discharge Date:   (unknown)               Expected Discharge Plan:  Home/Self Care  In-House Referral:     Discharge planning Services  CM Consult  Post Acute Care Choice:    Choice offered to:     DME Arranged:    DME Agency:     HH Arranged:    HH Agency:     Status of Service:  In process, will continue to follow  Medicare Important Message Given:    Date Medicare IM Given:    Medicare IM give by:    Date Additional Medicare IM Given:    Additional Medicare Important Message give by:     If discussed at Long Length of Stay Meetings, dates discussed:    Additional Comments:  Lanier Clam, RN 10/31/2014, 3:53 PM

## 2014-10-31 NOTE — Discharge Instructions (Addendum)
Information on my medicine - ELIQUIS (apixaban)  This medication education was reviewed with me or my healthcare representative as part of my discharge preparation.  The pharmacist that spoke with me during my hospital stay was:  Clance Boll, [facility]  Why was Eliquis prescribed for you? Eliquis was prescribed to treat blood clots that may have been found in the veins of your legs (deep vein thrombosis) or in your lungs (pulmonary embolism) and to reduce the risk of them occurring again.  What do You need to know about Eliquis ? The starting dose is 10 mg (two 5 mg tablets) taken TWICE daily for the FIRST SEVEN (7) DAYS, then on (enter date)  11/07/14  the dose is reduced to ONE 5 mg tablet taken TWICE daily.  Eliquis may be taken with or without food.   Try to take the dose about the same time in the morning and in the evening. If you have difficulty swallowing the tablet whole please discuss with your pharmacist how to take the medication safely.  Take Eliquis exactly as prescribed and DO NOT stop taking Eliquis without talking to the doctor who prescribed the medication.  Stopping may increase your risk of developing a new blood clot.  Refill your prescription before you run out.  After discharge, you should have regular check-up appointments with your healthcare provider that is prescribing your Eliquis.    What do you do if you miss a dose? If a dose of ELIQUIS is not taken at the scheduled time, take it as soon as possible on the same day and twice-daily administration should be resumed. The dose should not be doubled to make up for a missed dose.  Important Safety Information A possible side effect of Eliquis is bleeding. You should call your healthcare provider right away if you experience any of the following: ? Bleeding from an injury or your nose that does not stop. ? Unusual colored urine (red or dark brown) or unusual colored stools (red or black). ? Unusual bruising for  unknown reasons. ? A serious fall or if you hit your head (even if there is no bleeding).  Some medicines may interact with Eliquis and might increase your risk of bleeding or clotting while on Eliquis. To help avoid this, consult your healthcare provider or pharmacist prior to using any new prescription or non-prescription medications, including herbals, vitamins, non-steroidal anti-inflammatory drugs (NSAIDs) and supplements.  This website has more information on Eliquis (apixaban): http://www.eliquis.com/eliquis/home   Pulmonary Embolism A pulmonary (lung) embolism (PE) is a blood clot that has traveled to the lung and results in a blockage of blood flow in the affected lung. Most clots come from deep veins in the legs or pelvis. PE is a dangerous and potentially life-threatening condition that can be treated if identified. CAUSES Blood clots form in a vein for different reasons. Usually several things cause blood clots. They include:  The flow of blood slows down.  The inside of the vein is damaged in some way.  The person has a condition that makes the blood clot more easily. RISK FACTORS Some people are more likely than others to develop PE. Risk factors include:   Smoking.  Being overweight (obese).  Sitting or lying still for a long time. This includes long-distance travel, paralysis, or recovery from an illness or surgery. Other factors that increase risk are:   Older age, especially over 37 years of age.  Having a family history of blood clots or if you have  already had a blood clot.  Having major or lengthy surgery. This is especially true for surgery on the hip, knee, or belly (abdomen). Hip surgery is particularly high risk.  Having a long, thin tube (catheter) placed inside a vein during a medical procedure.  Breaking a hip or leg.  Having cancer or cancer treatment.  Medicines containing the female hormone estrogen. This includes birth control pills and  hormone replacement therapy.  Other circulation or heart problems.  Pregnancy and childbirth.  Hormone changes make the blood clot more easily during pregnancy.  The fetus puts pressure on the veins of the pelvis.  There is a risk of injury to veins during delivery or a caesarean delivery. The risk is highest just after childbirth.  PREVENTION   Exercise the legs regularly. Take a brisk 30 minute walk every day.  Maintain a weight that is appropriate for your height.  Avoid sitting or lying in bed for long periods of time without moving your legs.  Women, particularly those over the age of 35 years, should consider the risks and benefits of taking estrogen medicines, including birth control pills.  Do not smoke, especially if you take estrogen medicines.  Long-distance travel can increase your risk. You should exercise your legs by walking or pumping the muscles every hour.  Many of the risk factors above relate to situations that exist with hospitalization, either for illness, injury, or elective surgery. Prevention may include medical and nonmedical measures.   Your health care provider will assess you for the need for venous thromboembolism prevention when you are admitted to the hospital. If you are having surgery, your surgeon will assess you the day of or day after surgery.  SYMPTOMS  The symptoms of a PE usually start suddenly and include:  Shortness of breath.  Coughing.  Coughing up blood or blood-tinged mucus.  Chest pain. Pain is often worse with deep breaths.  Rapid heartbeat. DIAGNOSIS  If a PE is suspected, your health care provider will take a medical history and perform a physical exam. Other tests that may be required include:  Blood tests, such as studies of the clotting properties of your blood.  Imaging tests, such as ultrasound, CT, MRI, and other tests to see if you have clots in your legs or lungs.  An electrocardiogram. This can look for  heart strain from blood clots in the lungs. TREATMENT   The most common treatment for a PE is blood thinning (anticoagulant) medicine, which reduces the blood's tendency to clot. Anticoagulants can stop new blood clots from forming and old clots from growing. They cannot dissolve existing clots. Your body does this by itself over time. Anticoagulants can be given by mouth, through an intravenous (IV) tube, or by injection. Your health care provider will determine the best program for you.  Less commonly, clot-dissolving medicines (thrombolytics) are used to dissolve a PE. They carry a high risk of bleeding, so they are used mainly in severe cases.  Very rarely, a blood clot in the leg needs to be removed surgically.  If you are unable to take anticoagulants, your health care provider may arrange for you to have a filter placed in a main vein in your abdomen. This filter prevents clots from traveling to your lungs. HOME CARE INSTRUCTIONS   Take all medicines as directed by your health care provider.  Learn as much as you can about DVT.  Wear a medical alert bracelet or carry a medical alert card.  Ask your  health care provider how soon you can go back to normal activities. It is important to stay active to prevent blood clots. If you are on anticoagulant medicine, avoid contact sports.  It is very important to exercise. This is especially important while traveling, sitting, or standing for long periods of time. Exercise your legs by walking or by tightening and relaxing your leg muscles regularly. Take frequent walks.  You may need to wear compression stockings. These are tight elastic stockings that apply pressure to the lower legs. This pressure can help keep the blood in the legs from clotting. Taking Warfarin Warfarin is a daily medicine that is taken by mouth. Your health care provider will advise you on the length of treatment (usually 3-6 months, sometimes lifelong). If you take  warfarin:  Understand how to take warfarin and foods that can affect how warfarin works in Public relations account executive.  Too much and too little warfarin are both dangerous. Too much warfarin increases the risk of bleeding. Too little warfarin continues to allow the risk for blood clots. Warfarin and Regular Blood Testing While taking warfarin, you will need to have regular blood tests to measure your blood clotting time. These blood tests usually include both the prothrombin time (PT) and international normalized ratio (INR) tests. The PT and INR results allow your health care provider to adjust your dose of warfarin. It is very important that you have your PT and INR tested as often as directed by your health care provider.  Warfarin and Your Diet Avoid major changes in your diet, or notify your health care provider before changing your diet. Arrange a visit with a registered dietitian to answer your questions. Many foods, especially foods high in vitamin K, can interfere with warfarin and affect the PT and INR results. You should eat a consistent amount of foods high in vitamin K. Foods high in vitamin K include:   Spinach, kale, broccoli, cabbage, collard and turnip greens, Brussels sprouts, peas, cauliflower, seaweed, and parsley.  Beef and pork liver.  Green tea.  Soybean oil. Warfarin with Other Medicines Many medicines can interfere with warfarin and affect the PT and INR results. You must:  Tell your health care provider about any and all medicines, vitamins, and supplements you take, including aspirin and other over-the-counter anti-inflammatory medicines. Be especially cautious with aspirin and anti-inflammatory medicines. Ask your health care provider before taking these.  Do not take or discontinue any prescribed or over-the-counter medicine except on the advice of your health care provider or pharmacist. Warfarin Side Effects Warfarin can have side effects, such as easy bruising and difficulty  stopping bleeding. Ask your health care provider or pharmacist about other side effects of warfarin. You will need to:  Hold pressure over cuts for longer than usual.  Notify your dentist and other health care providers that you are taking warfarin before you undergo any procedures where bleeding may occur. Warfarin with Alcohol and Tobacco   Drinking alcohol frequently can increase the effect of warfarin, leading to excess bleeding. It is best to avoid alcoholic drinks or consume only very small amounts while taking warfarin. Notify your health care provider if you change your alcohol intake.  Do not use any tobacco products including cigarettes, chewing tobacco, or electronic cigarettes. If you smoke, quit. Ask your health care provider for help with quitting smoking. Alternative Medicines to Warfarin: Factor Xa Inhibitor Medicines  These blood thinning medicines are taken by mouth, usually for several weeks or longer. It is important  to take the medicine every single day, at the same time each day.  There are no regular blood tests required when using these medicines.  There are fewer food and drug interactions than with warfarin.  The side effects of this class of medicine is similar to that of warfarin, including excessive bruising or bleeding. Ask your health care provider or pharmacist about other potential side effects. SEEK MEDICAL CARE IF:   You notice a rapid heartbeat.  You feel weaker or more tired than usual.  You feel faint.  You notice increased bruising.  Your symptoms are not getting better in the time expected.  You are having side effects of medicine. SEEK IMMEDIATE MEDICAL CARE IF:   You have chest pain.  You have trouble breathing.  You have new or increased swelling or pain in one leg.  You cough up blood.  You notice blood in vomit, in a bowel movement, or in urine.  You have a fever. Symptoms of PE may represent a serious problem that is an  emergency. Do not wait to see if the symptoms will go away. Get medical help right away. Call your local emergency services (911 in the [place]). Do not drive yourself to the hospital. Document Released: 02/07/2000 Document Revised: 06/26/2013 Document Reviewed: 02/20/2013 [facility] Patient Information 2015 [place], [place]. This information is not intended to replace advice given to you by your health care provider. Make sure you discuss any questions you have with your health care provider.

## 2014-10-31 NOTE — Progress Notes (Signed)
OT Cancellation Note  Patient Details Name: Sayward Horvath MRN: [id number] DOB: 1930-11-06   Cancelled Treatment:    Reason Eval/Treat Not Completed: Other (comment) Spoke with nursing and reviewed chart--note pt has not been on heparin for 24 hours yet--will hold OT eval per Rehab Protocol and check back next date.  Lennox Laity  [phone] 10/31/2014, 9:04 AM

## 2014-10-31 NOTE — Progress Notes (Signed)
  Echocardiogram 2D Echocardiogram has been performed.  Nolon Rod 10/31/2014, 1:16 PM

## 2014-10-31 NOTE — Progress Notes (Signed)
PT Cancellation Note  Patient Details Name: Aastha Dayley MRN: [id number] DOB: 06-10-1930   Cancelled Treatment:    Reason Eval/Treat Not Completed: Medical issues which prohibited therapy (+ PE, on Heparin, per protocol, not ready for PT. check back tomorrow.)   Rada Hay 10/31/2014, 12:24 PM Blanchard Kelch PT [phone]

## 2014-10-31 NOTE — Progress Notes (Signed)
ANTICOAGULATION CONSULT NOTE - Follow Up Consult  Pharmacy Consult for Heparin Indication: pulmonary embolus  No Known Allergies  Patient Measurements: Height:  (165.1 cm) Weight: 195 lb 1.6 oz (88.497 kg) IBW/kg (Calculated) : 57 Heparin Dosing Weight:   Vital Signs: Temp: 98.3 F (36.8 C) (09/07 0522) Temp Source: Oral (09/07 0522) BP: 106/59 mmHg (09/07 0522) Pulse Rate: 83 (09/07 0522)  Labs:  Recent Labs  10/30/14 1809 10/30/14 2107 10/31/14 0515  HGB 13.3  --  11.5*  HCT 39.6  --  33.9*  PLT 211  --  226  APTT  --  28  --   LABPROT  --  14.3  --   INR  --  1.09  --   HEPARINUNFRC  --   --  1.76*  CREATININE 0.84  --  0.79    Estimated Creatinine Clearance: 60.6 mL/min (by C-G formula based on Cr of 0.79).   Medications:  Infusions:  . sodium chloride 75 mL/hr at 10/31/14 0141  . heparin      Assessment: Patient with high heparin level.  No heparin issues per RN.  Goal of Therapy:  Heparin level 0.3-0.7 units/ml Monitor platelets by anticoagulation protocol: Yes   Plan:  Hold heparin for now--RN aware Restart heparin at 1000 units/hr at 0730 Recheck heparin level at [address], [place] 10/31/2014,6:43 AM

## 2014-11-01 DIAGNOSIS — S82891D Other fracture of right lower leg, subsequent encounter for closed fracture with routine healing: Secondary | ICD-10-CM

## 2014-11-01 DIAGNOSIS — G9340 Encephalopathy, unspecified: Secondary | ICD-10-CM

## 2014-11-01 LAB — CBC
HEMATOCRIT: 32.4 % — AB (ref 36.0–46.0)
Hemoglobin: 10.8 g/dL — ABNORMAL LOW (ref 12.0–15.0)
MCH: 30.5 pg (ref 26.0–34.0)
MCHC: 33.3 g/dL (ref 30.0–36.0)
MCV: 91.5 fL (ref 78.0–100.0)
PLATELETS: 250 10*3/uL (ref 150–400)
RBC: 3.54 MIL/uL — AB (ref 3.87–5.11)
RDW: 14.6 % (ref 11.5–15.5)
WBC: 11.7 10*3/uL — AB (ref 4.0–10.5)

## 2014-11-01 LAB — GLUCOSE, CAPILLARY: Glucose-Capillary: 92 mg/dL (ref 65–99)

## 2014-11-01 NOTE — Evaluation (Signed)
Physical Therapy Evaluation Patient Details Name: Leslie Farley MRN: [id number] DOB: 1930-10-12 Today's Date: 11/01/2014   History of Present Illness  81 year old female with history of hypertension, GERD, COPD, arthritis, recent right ankle fracture (sent from ED with conservative management) presented with progressive shortness of breath for 7 days duration associated with nonproductive cough. Pt diagnosed with acute PE and started on Heparin which was switched over to Eliquis. Patient has been mostly nonambulatory following her recent right ankle fracture about a month back. X-ray showed mildly displaced oblique fracture through the distal fibula  Clinical Impression  Pt admitted with above diagnosis. Pt currently with functional limitations due to the deficits listed below (see PT Problem List).  Pt will benefit from skilled PT to increase their independence and safety with mobility to allow discharge to the venue listed below.  Pt reports coming to ED for fall with fibular fx however unable to follow up.  Would benefit from further recommendations (new splint? Able to take more weight - update WB status?)  Unless further recommendations provided, will maintain NWB status.  Recommend SNF at this time as pt presents with generalized weakness, decreased endurance, and poor mobility.       Follow Up Recommendations SNF    Equipment Recommendations  Wheelchair (measurements PT);Wheelchair cushion (measurements PT)    Recommendations for Other Services       Precautions / Restrictions Precautions Precautions: Fall Restrictions Weight Bearing Restrictions: Yes RLE Weight Bearing: Non weight bearing      Mobility  Bed Mobility Overal bed mobility: Needs Assistance Bed Mobility: Sit to Supine;Supine to Sit     Supine to sit: Mod assist;+2 for physical assistance Sit to supine: Mod assist;+2 for physical assistance   General bed mobility comments: assist for trunk and scooting to  EOB, utilized bed pad for positioning, assist to control descent of trunk as well as assist LEs  Transfers Overall transfer level: Needs assistance Equipment used: Rolling walker (2 wheeled) Transfers: Sit to/from Stand Sit to Stand: +2 physical assistance;Total assist         General transfer comment: attempted sit to stand twice however pt unable to stand fully erect, also PT placed foot under pt's foot to monitor WB and pt allowing too much weight (NWB status)  Ambulation/Gait                Stairs            Wheelchair Mobility    Modified Rankin (Stroke Patients Only)       Balance Overall balance assessment: Needs assistance;History of Falls Sitting-balance support: Bilateral upper extremity supported;Feet supported Sitting balance-Leahy Scale: Fair                                       Pertinent Vitals/Pain Pain Assessment: No/denies pain    Home Living Family/patient expects to be discharged to:: Skilled nursing facility                      Prior Function Level of Independence: Needs assistance   Gait / Transfers Assistance Needed: has mostly been bed bound since ankle fx, does reports a few steps with RW at home           Hand Dominance        Extremity/Trunk Assessment               Lower Extremity  Assessment: Generalized weakness;RLE deficits/detail RLE Deficits / Details: grossly at least 3/5 per functional observation however ankle in splint       Communication   Communication: Prefers language other than [place];Other (comment) (french)  Cognition Arousal/Alertness: Awake/alert Behavior During Therapy: WFL for tasks assessed/performed Overall Cognitive Status: Within Functional Limits for tasks assessed                      General Comments      Exercises General Exercises - Lower Extremity Long Arc Quad: AROM;Both;10 reps;Seated Hip Flexion/Marching: Seated;AROM;Both;10 reps       Assessment/Plan    PT Assessment Patient needs continued PT services  PT Diagnosis Difficulty walking;Generalized weakness   PT Problem List Decreased strength;Decreased activity tolerance;Decreased mobility;Decreased knowledge of use of DME;Decreased knowledge of precautions;Decreased balance  PT Treatment Interventions DME instruction;Balance training;Functional mobility training;Patient/family education;Therapeutic activities;Wheelchair mobility training;Therapeutic exercise   PT Goals (Current goals can be found in the Care Plan section) Acute Rehab PT Goals Patient Stated Goal: be able to get up PT Goal Formulation: With patient Time For Goal Achievement: 11/15/14 Potential to Achieve Goals: Good    Frequency Min 3X/week   Barriers to discharge        Co-evaluation               End of Session   Activity Tolerance: Patient limited by fatigue Patient left: in bed;with call bell/phone within reach;with bed alarm set           Time: 1478-2956 PT Time Calculation (min) (ACUTE ONLY): 17 min   Charges:   PT Evaluation $Initial PT Evaluation Tier I: 1 Procedure     PT G Codes:        Kayman Snuffer,KATHrine E 11/01/2014, 3:57 PM Zenovia Jarred, PT, DPT 11/01/2014 Pager: [phone]

## 2014-11-01 NOTE — Evaluation (Signed)
Occupational Therapy Evaluation Patient Details Name: Leslie Farley MRN: [id number] DOB: 1930-09-10 Today's Date: 11/01/2014    History of Present Illness 81 year old female with history of hypertension, GERD, COPD, arthritis, recent right ankle fracture (sent from ED with conservative management) presented with progressive shortness of breath for 7 days duration associated with nonproductive cough. Patient has been mostly nonambulatory following her recent right ankle fracture about a month back.   Clinical Impression   Pt admitted with cough. Pt currently with functional limitations due to the deficits listed below (see OT Problem List).  Pt will benefit from skilled OT to increase their safety and independence with ADL and functional mobility for ADL to facilitate discharge to venue listed below.      Follow Up Recommendations  SNF    Equipment Recommendations  None recommended by OT       Precautions / Restrictions Restrictions Weight Bearing Restrictions: Yes RLE Weight Bearing: Non weight bearing Other Position/Activity Restrictions: RLE      Mobility Bed Mobility Overal bed mobility: Needs Assistance Bed Mobility: Sit to Supine       Sit to supine: Max assist      Transfers Overall transfer level: Needs assistance Equipment used: 2 person hand held assist Transfers: Sit to/from Stand;Stand Pivot Transfers Sit to Stand: +2 physical assistance;+2 safety/equipment;Total assist;From elevated surface Stand pivot transfers: +2 physical assistance;+2 safety/equipment;Total assist                 ADL Overall ADL's : Needs assistance/impaired Eating/Feeding: Set up;Sitting   Grooming: Wash/dry face;Sitting;Set up                   Toilet Transfer: +2 for safety/equipment;+2 for physical assistance Toilet Transfer Details (indicate cue type and reason): used STEADY to get pt from bed to chair                           Pertinent  Vitals/Pain Pain Assessment: No/denies pain     Hand Dominance     Extremity/Trunk Assessment Upper Extremity Assessment Upper Extremity Assessment: RUE deficits/detail;Generalized weakness RUE Deficits / Details: decreased AROM RUE/LUE overall weakness           Communication Communication Communication: Prefers language other than [place];Other (comment) (french)   Cognition Arousal/Alertness: Awake/alert Behavior During Therapy: WFL for tasks assessed/performed Overall Cognitive Status: Within Functional Limits for tasks assessed                                Home Living Family/patient expects to be discharged to:: Skilled nursing facility                                        Prior Functioning/Environment Level of Independence: Needs assistance             OT Diagnosis: Generalized weakness   OT Problem List: Decreased strength;Decreased activity tolerance;Impaired balance (sitting and/or standing);Decreased knowledge of precautions   OT Treatment/Interventions: Self-care/ADL training;Patient/family education;DME and/or AE instruction    OT Goals(Current goals can be found in the care plan section) Acute Rehab OT Goals Patient Stated Goal: be able to get up OT Goal Formulation: With patient Time For Goal Achievement: 11/15/14 Potential to Achieve Goals: Good  OT Frequency: Min 2X/week   Barriers to D/C: Decreased caregiver support  End of Session Equipment Utilized During Treatment: Other (comment) (steady) Nurse Communication: Mobility status  Activity Tolerance: Patient tolerated treatment well Patient left: in chair;with nursing/sitter in room;with call bell/phone within reach   Time: 1610-9604 OT Time Calculation (min): 34 min Charges:  OT General Charges $OT Visit: 1 Procedure OT Evaluation $Initial OT Evaluation Tier I: 1 Procedure OT Treatments $Self Care/Home Management : 8-22 mins G-Codes:     Einar Crow D 11/01/2014, 12:35 PM

## 2014-11-01 NOTE — Clinical Social Work Placement (Signed)
Patient has a bed at [facility] when stable - anticipating possible discharge tomorrow, Fri 9/9.     Lincoln Maxin, LCSW [facility] Clinical Social Worker cell #: [phone]    CLINICAL SOCIAL WORK PLACEMENT  NOTE  Date:  11/01/2014  Patient Details  Name: Leslie Farley MRN: [id number] Date of Birth: [birth year]  Clinical Social Work is seeking post-discharge placement for this patient at the Skilled  Nursing Facility level of care (*CSW will initial, date and re-position this form in  chart as items are completed):  Yes   Patient/family provided with [place] Clinical Social Work Department's list of facilities offering this level of care within the geographic area requested by the patient (or if unable, by the patient's family).  Yes   Patient/family informed of their freedom to choose among providers that offer the needed level of care, that participate in Medicare, Medicaid or managed care program needed by the patient, have an available bed and are willing to accept the patient.  Yes   Patient/family informed of [facility]'s ownership interest in [facility] and [facility], as well as of the fact that they are under no obligation to receive care at these facilities.  PASRR submitted to EDS on 11/01/14     PASRR number received on 11/01/14     Existing PASRR number confirmed on       FL2 transmitted to all facilities in geographic area requested by pt/family on 11/01/14     FL2 transmitted to all facilities within larger geographic area on       Patient informed that his/her managed care company has contracts with or will negotiate with certain facilities, including the following:        Yes   Patient/family informed of bed offers received.  Patient chooses bed at [facility]     Physician recommends and patient chooses bed at      Patient to be transferred to [facility] on  .  Patient to be  transferred to facility by       Patient family notified on   of transfer.  Name of family member notified:        PHYSICIAN       Additional Comment:    _______________________________________________ Arlyss Repress, LCSW 11/01/2014, 3:10 PM

## 2014-11-01 NOTE — Progress Notes (Addendum)
[facility] PROGRESS NOTE  Leslie Farley ZOX:[id number] DOB: 10/14/1930 DOA: 10/30/2014 PCP: No primary care provider on file.  Brief narrative 81 year old female with history of hypertension, GERD, COPD, arthritis, recent right ankle fracture (sent from ED with conservative management) presented with progressive shortness of breath for 7 days duration associated with nonproductive cough. Patient has been mostly nonambulatory following her recent right ankle fracture about a month back. In the ED patient was found to have a low burden acute subsegmental PE over the right lower lung without evidence of pulmonary arterial hypertension or right heart strain. Patient admitted for further evaluation and management.  Assessment/Plan: Acute pulmonary embolism Possibly associated with lower extremity DVT from recent right ankle fracture and mobility. Patient started on IV heparin drip on admission. Doppler lower extremity negative for DVT. 2-D echo unremarkable. Switched over to Eliquis plan to treat for 6 months duration.   History of COPD and asthma with mild exacerbation. Patient had mild wheezing on admission. Placed on when necessary DuoNeb and daily prednisone. Continue Mucinex for cough.  Acute encephalopathy Patient found to be more confused this morning. Afebrile. Check a UA. Will discontinue Percocet and place her on tylenol and Motrin when necessary for pain.  Recent right ankle fracture. Following a mechanical fall at home about 5 weeks back. (Reportedly has had multiple falls at home). X-ray showed mildly displaced oblique fracture through the distal fibula.  repeat x-ray showing mildly displaced fibular fracture with preserved tibial pallor articulation..   Essential hypertension Continue HCTZ and Diovan.  GERD Continue Protonix  DVT prophylaxis: eliquis Diet: Regular    Code Status: partial ( no intubation) Family Communication: Spoke with daughter [name] on the  phone Disposition Plan: SNF possibly on 9/9    Consultants:  None  Procedures:  2-D echo  CT angiogram chest  Lower Extremity venous Doppler  Antibiotics:  None  HPI/Subjective: Patient appears confused today. Did not recognize me and was unable to provide details about her daughter. Afebrile.  Objective: Filed Vitals:   11/01/14 0818  BP:   Pulse: 85  Temp:   Resp: 18    Intake/Output Summary (Last 24 hours) at 11/01/14 1453 Last data filed at 11/01/14 1448  Gross per 24 hour  Intake   2865 ml  Output    200 ml  Net   2665 ml   Filed Weights   10/30/14 2143  Weight: 88.497 kg (195 lb 1.6 oz)    Exam:   General:   no acute distress  HEENT: No pallor, muscular mucosa  Chest: Clear to auscultation bilaterally  CVS: Normal S1, no murmurs rub or gallop  GI: Soft, nondistended, nontender, bowel sounds present  Musculoskeletal: Ace wrap over  right ankle  CNS: Alert and oriented x 1-2, confused  Data Reviewed: Basic Metabolic Panel:  Recent Labs Lab 10/30/14 1809 10/31/14 0515  NA 139 136  K 3.6 3.7  CL 103 103  CO2 28 23  GLUCOSE 116* 145*  BUN 13 16  CREATININE 0.84 0.79  CALCIUM 10.3 9.7   Liver Function Tests:  Recent Labs Lab 10/31/14 0515  AST 22  ALT 22  ALKPHOS 69  BILITOT 0.7  PROT 6.8  ALBUMIN 3.6   No results for input(s): LIPASE, AMYLASE in the last 168 hours. No results for input(s): AMMONIA in the last 168 hours. CBC:  Recent Labs Lab 10/30/14 1809 10/31/14 0515 11/01/14 0520  WBC 7.7 5.6 11.7*  NEUTROABS 4.4  --   --   HGB  13.3 11.5* 10.8*  HCT 39.6 33.9* 32.4*  MCV 92.3 90.9 91.5  PLT 211 226 250   Cardiac Enzymes: No results for input(s): CKTOTAL, CKMB, CKMBINDEX, TROPONINI in the last 168 hours. BNP (last 3 results)  Recent Labs  10/30/14 1809  BNP 41.6    ProBNP (last 3 results) No results for input(s): PROBNP in the last 8760 hours.  CBG:  Recent Labs Lab 10/31/14 0747  11/01/14 0744  GLUCAP 145* 92    No results found for this or any previous visit (from the past 240 hour(s)).   Studies: Dg Chest 2 View  10/30/2014   CLINICAL DATA:  Increasing shortness of breath for 1 week history of COPD  EXAM: CHEST  2 VIEW  COMPARISON:  10/01/2014  FINDINGS: Stable heart size, within normal limits. Stable mild bronchitic change. Vascular pattern normal. Lungs clear. No effusions.  IMPRESSION: No active cardiopulmonary disease.   Electronically Signed   By: Esperanza Heir M.D.   On: 10/30/2014 18:41   Dg Ankle Complete Right  10/31/2014   CLINICAL DATA:  81 year old female with right ankle fracture.  EXAM: RIGHT ANKLE - COMPLETE 3+ VIEW  COMPARISON:  Radiograph dated 10/01/2014  FINDINGS: Evaluation of the study is limited due to chest overlying the ankle. A minimally displaced oblique fracture of the distal fibula is again seen. The tibiotalar articulation is preserved. The soft tissues are unremarkable.  IMPRESSION: Minimally displaced oblique fracture of the distal fibula.   Electronically Signed   By: Elgie Collard M.D.   On: 10/31/2014 19:00   Ct Angio Chest Pe W/cm &/or Wo Cm  10/30/2014   CLINICAL DATA:  81 year old female with a reported history asthma, wheezing and shortness of breath. Chest pain.  EXAM: CT ANGIOGRAPHY CHEST WITH CONTRAST  TECHNIQUE: Multidetector CT imaging of the chest was performed using the standard protocol during bolus administration of intravenous contrast. Multiplanar CT image reconstructions and MIPs were obtained to evaluate the vascular anatomy.  CONTRAST:  OMNIPAQUE IOHEXOL 350 MG/ML SOLN  COMPARISON:  Chest radiograph from earlier today.  FINDINGS: Mediastinum/Nodes: Normal heart size. No dilatation of the right ventricle or right atrium. Normal position of the interventricular septum. No contrast reflux into the IVC. No pericardial fluid/thickening. Atherosclerotic nonaneurysmal thoracic aorta. Normal caliber main pulmonary artery.  There are subsegmental acute pulmonary emboli in the right lower lobe (series 10/ image 162) and possibly in the left upper lobe (10/77). No central pulmonary emboli. Hypodense 1.6 cm right thyroid lobe nodule. Normal esophagus. No axillary, mediastinal or hilar lymphadenopathy.  Lungs/Pleura: No pneumothorax. No pleural effusion. No significant pulmonary nodules, lung masses or acute consolidative airspace disease. Mild subpleural reticulation in the dependent lower lobes is likely due to hypoventilation.  Upper abdomen: Tiny hiatal hernia.  Musculoskeletal: Moth-eaten appearance of the bones, with no focal aggressive osseous lesion.  Review of the MIP images confirms the above findings.  IMPRESSION: 1. Low burden acute subsegmental pulmonary embolism. No CT evidence of pulmonary arterial hypertension or right heart strain. 2. Moth-eaten appearance of the bones, a nonspecific finding that could be due to osteopenia or infiltrative osseous process. 3. Tiny hiatal hernia. Critical Value/emergent results were called by telephone at the time of interpretation on 10/30/2014 at 7:56 pm to PA Arthor Captain, who verbally acknowledged these results.   Electronically Signed   By: Delbert Phenix M.D.   On: 10/30/2014 19:58    Scheduled Meds: . apixaban  10 mg Oral BID   Followed by  . [  START ON 11/07/2014] apixaban  5 mg Oral BID  . dextromethorphan-guaiFENesin  1 tablet Oral BID  . docusate sodium  100 mg Oral BID  . hydrALAZINE  5 mg Intravenous Once  . hydrochlorothiazide  12.5 mg Oral Daily  . ipratropium-albuterol  3 mL Nebulization TID  . irbesartan  75 mg Oral Daily  . mometasone-formoterol  2 puff Inhalation BID  . montelukast  10 mg Oral QHS  . pantoprazole  40 mg Oral Daily  . predniSONE  50 mg Oral Q breakfast  . sodium chloride  3 mL Intravenous Q12H   Continuous Infusions: . sodium chloride 75 mL/hr at 11/01/14 0153      Time spent: 25 minutes    Montserrath Madding  [facility] Pager [phone] 7PM-7AM, please contact night-coverage at [email or web address], password [facility] 11/01/2014, 2:53 PM  LOS: 2 days

## 2014-11-01 NOTE — Care Management Important Message (Signed)
Important Message  Patient Details  Name: Leslie Farley MRN: [id number] Date of Birth: 19-May-1930   Medicare Important Message Given:  [facility] notification given    Haskell Flirt 11/01/2014, 11:09 AMImportant Message  Patient Details  Name: Leslie Farley MRN: [id number] Date of Birth: 28-Apr-1930   Medicare Important Message Given:  Yes-second notification given    Haskell Flirt 11/01/2014, 11:09 AM

## 2014-11-02 ENCOUNTER — Inpatient Hospital Stay (HOSPITAL_COMMUNITY): Payer: Medicare Other

## 2014-11-02 DIAGNOSIS — I2699 Other pulmonary embolism without acute cor pulmonale: Secondary | ICD-10-CM | POA: Diagnosis not present

## 2014-11-02 DIAGNOSIS — J9610 Chronic respiratory failure, unspecified whether with hypoxia or hypercapnia: Secondary | ICD-10-CM | POA: Diagnosis not present

## 2014-11-02 DIAGNOSIS — J80 Acute respiratory distress syndrome: Secondary | ICD-10-CM | POA: Diagnosis not present

## 2014-11-02 DIAGNOSIS — M25571 Pain in right ankle and joints of right foot: Secondary | ICD-10-CM | POA: Diagnosis not present

## 2014-11-02 DIAGNOSIS — M1990 Unspecified osteoarthritis, unspecified site: Secondary | ICD-10-CM | POA: Diagnosis not present

## 2014-11-02 DIAGNOSIS — S8264XD Nondisplaced fracture of lateral malleolus of right fibula, subsequent encounter for closed fracture with routine healing: Secondary | ICD-10-CM | POA: Diagnosis not present

## 2014-11-02 DIAGNOSIS — G9340 Encephalopathy, unspecified: Secondary | ICD-10-CM | POA: Diagnosis not present

## 2014-11-02 DIAGNOSIS — J449 Chronic obstructive pulmonary disease, unspecified: Secondary | ICD-10-CM | POA: Diagnosis not present

## 2014-11-02 DIAGNOSIS — L03032 Cellulitis of left toe: Secondary | ICD-10-CM | POA: Diagnosis not present

## 2014-11-02 DIAGNOSIS — S8264XA Nondisplaced fracture of lateral malleolus of right fibula, initial encounter for closed fracture: Secondary | ICD-10-CM | POA: Diagnosis not present

## 2014-11-02 DIAGNOSIS — K5901 Slow transit constipation: Secondary | ICD-10-CM | POA: Diagnosis not present

## 2014-11-02 DIAGNOSIS — S8261XD Displaced fracture of lateral malleolus of right fibula, subsequent encounter for closed fracture with routine healing: Secondary | ICD-10-CM | POA: Diagnosis not present

## 2014-11-02 DIAGNOSIS — S82891D Other fracture of right lower leg, subsequent encounter for closed fracture with routine healing: Secondary | ICD-10-CM | POA: Diagnosis not present

## 2014-11-02 DIAGNOSIS — S82401D Unspecified fracture of shaft of right fibula, subsequent encounter for closed fracture with routine healing: Secondary | ICD-10-CM | POA: Diagnosis not present

## 2014-11-02 DIAGNOSIS — E041 Nontoxic single thyroid nodule: Secondary | ICD-10-CM | POA: Diagnosis not present

## 2014-11-02 DIAGNOSIS — M6281 Muscle weakness (generalized): Secondary | ICD-10-CM | POA: Diagnosis not present

## 2014-11-02 DIAGNOSIS — S82401A Unspecified fracture of shaft of right fibula, initial encounter for closed fracture: Secondary | ICD-10-CM | POA: Diagnosis present

## 2014-11-02 DIAGNOSIS — F801 Expressive language disorder: Secondary | ICD-10-CM | POA: Diagnosis not present

## 2014-11-02 DIAGNOSIS — K219 Gastro-esophageal reflux disease without esophagitis: Secondary | ICD-10-CM | POA: Diagnosis not present

## 2014-11-02 DIAGNOSIS — J441 Chronic obstructive pulmonary disease with (acute) exacerbation: Secondary | ICD-10-CM | POA: Diagnosis not present

## 2014-11-02 DIAGNOSIS — E079 Disorder of thyroid, unspecified: Secondary | ICD-10-CM | POA: Diagnosis not present

## 2014-11-02 DIAGNOSIS — I10 Essential (primary) hypertension: Secondary | ICD-10-CM | POA: Diagnosis not present

## 2014-11-02 DIAGNOSIS — Z7901 Long term (current) use of anticoagulants: Secondary | ICD-10-CM | POA: Diagnosis not present

## 2014-11-02 DIAGNOSIS — Z9181 History of falling: Secondary | ICD-10-CM | POA: Diagnosis not present

## 2014-11-02 LAB — URINALYSIS, ROUTINE W REFLEX MICROSCOPIC
BILIRUBIN URINE: NEGATIVE
GLUCOSE, UA: NEGATIVE mg/dL
Hgb urine dipstick: NEGATIVE
KETONES UR: NEGATIVE mg/dL
Leukocytes, UA: NEGATIVE
Nitrite: NEGATIVE
PH: 6 (ref 5.0–8.0)
Protein, ur: NEGATIVE mg/dL
Specific Gravity, Urine: 1.01 (ref 1.005–1.030)
Urobilinogen, UA: 0.2 mg/dL (ref 0.0–1.0)

## 2014-11-02 LAB — T4, FREE: Free T4: 1.1 ng/dL (ref 0.61–1.12)

## 2014-11-02 LAB — TSH: TSH: 0.958 u[IU]/mL (ref 0.350–4.500)

## 2014-11-02 LAB — GLUCOSE, CAPILLARY: GLUCOSE-CAPILLARY: 96 mg/dL (ref 65–99)

## 2014-11-02 MED ORDER — APIXABAN 5 MG PO TABS: 5.0000 mg | ORAL_TABLET | Freq: Two times a day (BID) | ORAL | Status: DC

## 2014-11-02 MED ORDER — APIXABAN 5 MG PO TABS: 10.0000 mg | ORAL_TABLET | Freq: Two times a day (BID) | ORAL | Status: DC

## 2014-11-02 NOTE — Clinical Social Work Placement (Signed)
Patient is set to discharge to [facility] today. Patient & daughter, Leslie Farley (cell#: [phone]) aware. Discharge packet given to RN, Susie. [facility] will be called for transport when ready.     Lincoln Maxin, LCSW [facility] Clinical Social Worker cell #: [phone]    CLINICAL SOCIAL WORK PLACEMENT  NOTE  Date:  11/02/2014  Patient Details  Name: Leslie Farley MRN: [id number] Date of Birth: 1930-04-12  Clinical Social Work is seeking post-discharge placement for this patient at the Skilled  Nursing Facility level of care (*CSW will initial, date and re-position this form in  chart as items are completed):  Yes   Patient/family provided with [place] Clinical Social Work Department's list of facilities offering this level of care within the geographic area requested by the patient (or if unable, by the patient's family).  Yes   Patient/family informed of their freedom to choose among providers that offer the needed level of care, that participate in Medicare, Medicaid or managed care program needed by the patient, have an available bed and are willing to accept the patient.  Yes   Patient/family informed of [facility]'s ownership interest in [facility] and [facility], as well as of the fact that they are under no obligation to receive care at these facilities.  PASRR submitted to EDS on 11/01/14     PASRR number received on 11/01/14     Existing PASRR number confirmed on       FL2 transmitted to all facilities in geographic area requested by pt/family on 11/01/14     FL2 transmitted to all facilities within larger geographic area on       Patient informed that his/her managed care company has contracts with or will negotiate with certain facilities, including the following:        Yes   Patient/family informed of bed offers received.  Patient chooses bed at [facility]     Physician recommends and patient chooses bed at       Patient to be transferred to [facility] on 11/02/14.  Patient to be transferred to facility by [facility]     Patient family notified on 11/02/14 of transfer.  Name of family member notified:  patient's daughter, Leslie Farley [phone]) via phone     PHYSICIAN       Additional Comment:    _______________________________________________ Arlyss Repress, LCSW 11/02/2014, 2:13 PM

## 2014-11-02 NOTE — Discharge Summary (Addendum)
Physician Discharge Summary  Leslie Farley ZOX:[id number] DOB: [birth year] DOA: 10/30/2014  PCP: No primary care provider on file.  Admit date: 10/30/2014 Discharge date: 11/02/2014  Time spent: 30 minutes  Recommendations for Outpatient Follow-up:  #1 Patient being discharged to [facility] skilled nursing facility for ongoing physical therapy. #2 patient will be on anticoagulation for at least 6 months. (Stop date 04/30/2015)  #3 please check TSH and free T3/T4 sent on 9/9.  ultrasound of her thyroid done prior to discharge given findings of right lobe of thyroid mass seen on head CT on 10/01/2014. Please follow-up with results. #4 right ankle splint cast applied prior to discharge for partially displaced right fibular fracture. Patient should follow-up with Dr. Ranell Patrick on 9/13. (Please call the office on 9/12 to confirm the time). Recommend non-weightbearing on right leg and use of crutches.   Discharge Diagnoses:  Principal Problem:   PE (pulmonary embolism)   Active Problems:   COPD (chronic obstructive pulmonary disease)   Asthma   Hypertension   GERD (gastroesophageal reflux disease)   Arthritis   Acute on chronic respiratory failure   Closed right ankle fracture   Discharge Condition: Fair  Diet recommendation: Low sodium  CODE STATUS: Partial ( DO NOT INTUBATE )  Filed Weights   10/30/14 2143  Weight: 88.497 kg (195 lb 1.6 oz)    History of present illness:  Please refer to admission H&P for details, in brief, 81 year old [place] female with history of hypertension, GERD, COPD, arthritis, recent right ankle fracture (sent from ED with conservative management) presented with progressive shortness of breath for 7 days duration associated with nonproductive cough. Patient has been mostly nonambulatory following her recent right ankle fracture about a month back. In the ED patient was found to have a low burden acute subsegmental PE over the right lower lung without evidence  of pulmonary arterial hypertension or right heart strain. Patient admitted for further evaluation and management.  Hospital Course:  Acute pulmonary embolism Possibly associated with lower extremity DVT from recent right ankle fracture and mobility. Patient started on IV heparin drip on admission. Doppler lower extremity negative for DVT. 2-D echo unremarkable. Switched over to Eliquis plan to treat for 6 months duration. (Stop date 05/02/2015)   History of COPD and asthma with mild exacerbation. Patient had mild wheezing on admission. Placed on when necessary DuoNeb and daily prednisone. Will discontinue prednisone given acute encephalopathy on 9/8. (Symptoms have subsided and has received 3 days of steroid already)  Acute encephalopathy on 9/8 Patient found to be pleasantly confused. Afebrile. UA unremarkable. Chest x-ray on admission negative for infiltrate. Clinically monitored and has subsided on its own.  Recent right ankle fracture. Following a mechanical fall at home about 5 weeks back. (Reportedly has had multiple falls at home). X-ray showed mildly displaced oblique fracture through the distal fibula. repeat x-ray showing mildly displaced fibular fracture . -Patient has not required any pain medications while at rest. Given her confusion I have avoided narcotics. If he requires any pain medications with therapy recommend to use Tylenol followed by low-dose tramadol or Vicodin. -Spoke with orthopedics Dr. Langston Masker who evaluated the x-ray and recommends L and U posterior spent with plaster with 14 neutral position. Recommends nonweightbearing with crutches. Past cast will be applied prior to discharge. Continue physical therapy at the facility. Patient should follow-up with him on 9/13.  Essential hypertension Continue HCTZ and Diovan.  GERD Continue Protonix       Family Communication: Spoke with daughter [name]  on the phone Disposition Plan: SNF     Consultants:  None  Procedures:  2-D echo  CT angiogram chest  Lower Extremity venous Doppler  Antibiotics:  None    Discharge Exam: Filed Vitals:   11/02/14 0550  BP: 167/80  Pulse: 66  Temp: 98.1 F (36.7 C)  Resp: 18    General:Elderly female in no acute distress  HEENT: No pallor, muscular mucosa  Chest: Clear to auscultation bilaterally  CVS: Normal S1, no murmurs rub or gallop  GI: Soft, nondistended, nontender, bowel sounds present  Musculoskeletal: Ace wrap over right ankle  CNS: Alert and oriented   Discharge Instructions    Current Discharge Medication List    START taking these medications   Details  apixaban (ELIQUIS) 5 MG TABS tablet Take 2 tablets (10 mg total) by mouth 2 (two) times daily. Qty: 10 tablet, Refills: 0 (until 9/13)      apixaban (ELIQUIS) 5 MG TABS tablet Take 1 tablet (5 mg total) by mouth 2 (two) times daily. Qty: 60 tablet, Refills: 0 (start taking from 11/07/2014)         CONTINUE these medications which have NOT CHANGED   Details  acetaminophen (TYLENOL) 500 MG tablet Take 500-1,000 mg by mouth every 6 (six) hours as needed for mild pain or moderate pain.    albuterol (PROVENTIL HFA;VENTOLIN HFA) 108 (90 BASE) MCG/ACT inhaler Inhale 1-2 puffs into the lungs every 6 (six) hours as needed for wheezing or shortness of breath.    albuterol (PROVENTIL) (2.5 MG/3ML) 0.083% nebulizer solution Take 2.5 mg by nebulization every 6 (six) hours as needed for wheezing or shortness of breath.    esomeprazole (NEXIUM) 40 MG capsule Take 40 mg by mouth daily at 12 noon.    Fluticasone-Salmeterol (ADVAIR) 500-50 MCG/DOSE AEPB Inhale 1 puff into the lungs 2 (two) times daily.    hydrochlorothiazide (MICROZIDE) 12.5 MG capsule Take 12.5 mg by mouth daily.    montelukast (SINGULAIR) 10 MG tablet Take 10 mg by mouth at bedtime.    valsartan (DIOVAN) 80 MG tablet Take 80 mg by mouth daily.      STOP taking these  medications     cephALEXin (KEFLEX) 500 MG capsule      docusate sodium (COLACE) 100 MG capsule      HYDROcodone-acetaminophen (NORCO/VICODIN) 5-325 MG per tablet      ondansetron (ZOFRAN ODT) 4 MG disintegrating tablet      oxyCODONE-acetaminophen (PERCOCET/ROXICET) 5-325 MG per tablet        No Known Allergies Follow-up Information    Follow up with Verlee Rossetti, MD. Call on 11/06/2014.   Specialty:  Orthopedic Surgery   Contact information:   [address] [place] [address] [phone]       Please follow up.   Why:  MD at SNF        The results of significant diagnostics from this hospitalization (including imaging, microbiology, ancillary and laboratory) are listed below for reference.    Significant Diagnostic Studies: Dg Chest 2 View  10/30/2014   CLINICAL DATA:  Increasing shortness of breath for 1 week history of COPD  EXAM: CHEST  2 VIEW  COMPARISON:  10/01/2014  FINDINGS: Stable heart size, within normal limits. Stable mild bronchitic change. Vascular pattern normal. Lungs clear. No effusions.  IMPRESSION: No active cardiopulmonary disease.   Electronically Signed   By: Esperanza Heir M.D.   On: 10/30/2014 18:41   Dg Ankle Complete Right  10/31/2014  CLINICAL DATA:  81 year old female with right ankle fracture.  EXAM: RIGHT ANKLE - COMPLETE 3+ VIEW  COMPARISON:  Radiograph dated 10/01/2014  FINDINGS: Evaluation of the study is limited due to chest overlying the ankle. A minimally displaced oblique fracture of the distal fibula is again seen. The tibiotalar articulation is preserved. The soft tissues are unremarkable.  IMPRESSION: Minimally displaced oblique fracture of the distal fibula.   Electronically Signed   By: Elgie Collard M.D.   On: 10/31/2014 19:00   Ct Angio Chest Pe W/cm &/or Wo Cm  10/30/2014   CLINICAL DATA:  81 year old female with a reported history asthma, wheezing and shortness of breath. Chest pain.  EXAM: CT ANGIOGRAPHY  CHEST WITH CONTRAST  TECHNIQUE: Multidetector CT imaging of the chest was performed using the standard protocol during bolus administration of intravenous contrast. Multiplanar CT image reconstructions and MIPs were obtained to evaluate the vascular anatomy.  CONTRAST:  OMNIPAQUE IOHEXOL 350 MG/ML SOLN  COMPARISON:  Chest radiograph from earlier today.  FINDINGS: Mediastinum/Nodes: Normal heart size. No dilatation of the right ventricle or right atrium. Normal position of the interventricular septum. No contrast reflux into the IVC. No pericardial fluid/thickening. Atherosclerotic nonaneurysmal thoracic aorta. Normal caliber main pulmonary artery. There are subsegmental acute pulmonary emboli in the right lower lobe (series 10/ image 162) and possibly in the left upper lobe (10/77). No central pulmonary emboli. Hypodense 1.6 cm right thyroid lobe nodule. Normal esophagus. No axillary, mediastinal or hilar lymphadenopathy.  Lungs/Pleura: No pneumothorax. No pleural effusion. No significant pulmonary nodules, lung masses or acute consolidative airspace disease. Mild subpleural reticulation in the dependent lower lobes is likely due to hypoventilation.  Upper abdomen: Tiny hiatal hernia.  Musculoskeletal: Moth-eaten appearance of the bones, with no focal aggressive osseous lesion.  Review of the MIP images confirms the above findings.  IMPRESSION: 1. Low burden acute subsegmental pulmonary embolism. No CT evidence of pulmonary arterial hypertension or right heart strain. 2. Moth-eaten appearance of the bones, a nonspecific finding that could be due to osteopenia or infiltrative osseous process. 3. Tiny hiatal hernia. Critical Value/emergent results were called by telephone at the time of interpretation on 10/30/2014 at 7:56 pm to PA Arthor Captain, who verbally acknowledged these results.   Electronically Signed   By: Delbert Phenix M.D.   On: 10/30/2014 19:58    Microbiology: No results found for this or any  previous visit (from the past 240 hour(s)).   Labs: Basic Metabolic Panel:  Recent Labs Lab 10/30/14 1809 10/31/14 0515  NA 139 136  K 3.6 3.7  CL 103 103  CO2 28 23  GLUCOSE 116* 145*  BUN 13 16  CREATININE 0.84 0.79  CALCIUM 10.3 9.7   Liver Function Tests:  Recent Labs Lab 10/31/14 0515  AST 22  ALT 22  ALKPHOS 69  BILITOT 0.7  PROT 6.8  ALBUMIN 3.6   No results for input(s): LIPASE, AMYLASE in the last 168 hours. No results for input(s): AMMONIA in the last 168 hours. CBC:  Recent Labs Lab 10/30/14 1809 10/31/14 0515 11/01/14 0520  WBC 7.7 5.6 11.7*  NEUTROABS 4.4  --   --   HGB 13.3 11.5* 10.8*  HCT 39.6 33.9* 32.4*  MCV 92.3 90.9 91.5  PLT 211 226 250   Cardiac Enzymes: No results for input(s): CKTOTAL, CKMB, CKMBINDEX, TROPONINI in the last 168 hours. BNP: BNP (last 3 results)  Recent Labs  10/30/14 1809  BNP 41.6    ProBNP (last 3 results)  No results for input(s): PROBNP in the last 8760 hours.  CBG:  Recent Labs Lab 10/31/14 0747 11/01/14 0744 11/02/14 0555  GLUCAP 145* 92 96       Signed:  Kimyatta Lecy  [facility] 11/02/2014, 11:55 AM

## 2014-11-02 NOTE — Progress Notes (Signed)
[place] of soft tissue has still not been done, called [place] again at 14:45 and Rachael is now at bedside.

## 2014-11-02 NOTE — Progress Notes (Signed)
Called and spoke with Leslie Farley in [place] regarding order for Soft tissue that Dr. Gonzella Lex ordered this morning at 9:05. I told Leslie Farley that this pending discharge.  She said it would be closer to 2:00 pm this afternoon before they could get to her.  Dr. Gonzella Lex is aware.

## 2014-11-02 NOTE — Progress Notes (Signed)
Patient is being discharged to [facility].   - Report called to Facility - Spoke with patients daughter [name]) who will be meeting her over at [facility] -IV removed -Tele removed -[facility] has been called

## 2014-11-02 NOTE — Progress Notes (Signed)
[facility] called for transport.     Kandance Yano, LCSW [facility] Clinical Social Worker cell #: [phone]  

## 2014-11-03 LAB — T3, FREE: T3, Free: 1.9 pg/mL — ABNORMAL LOW (ref 2.0–4.4)

## 2014-11-05 ENCOUNTER — Non-Acute Institutional Stay (SKILLED_NURSING_FACILITY): Payer: Medicare Other | Admitting: Internal Medicine

## 2014-11-05 ENCOUNTER — Encounter: Payer: Self-pay | Admitting: Internal Medicine

## 2014-11-05 DIAGNOSIS — G9340 Encephalopathy, unspecified: Secondary | ICD-10-CM

## 2014-11-05 DIAGNOSIS — S82401D Unspecified fracture of shaft of right fibula, subsequent encounter for closed fracture with routine healing: Secondary | ICD-10-CM | POA: Diagnosis not present

## 2014-11-05 DIAGNOSIS — K219 Gastro-esophageal reflux disease without esophagitis: Secondary | ICD-10-CM | POA: Diagnosis not present

## 2014-11-05 DIAGNOSIS — I10 Essential (primary) hypertension: Secondary | ICD-10-CM

## 2014-11-05 DIAGNOSIS — E041 Nontoxic single thyroid nodule: Secondary | ICD-10-CM | POA: Diagnosis not present

## 2014-11-05 DIAGNOSIS — M1990 Unspecified osteoarthritis, unspecified site: Secondary | ICD-10-CM | POA: Diagnosis not present

## 2014-11-05 DIAGNOSIS — J441 Chronic obstructive pulmonary disease with (acute) exacerbation: Secondary | ICD-10-CM

## 2014-11-05 DIAGNOSIS — I2699 Other pulmonary embolism without acute cor pulmonale: Secondary | ICD-10-CM | POA: Diagnosis not present

## 2014-11-05 NOTE — Progress Notes (Signed)
MRN: [id number] Name: Leslie Farley  Sex: female Age: 81 y.o. DOB: 1930/12/17  PSC #: Sonny Dandy Facility/Room:120 Level Of Care: SNF Provider: Merrilee Seashore D Emergency Contacts: Extended Emergency Contact Information Primary Emergency Contact: Guillaume,Nathalie Address: [address] [place], [place] [address] [place] Home Phone: [phone] Mobile Phone: [phone] Relation: Daughter Secondary Emergency Contact: Maris [place] Home Phone: [phone] Mobile Phone: [phone] Relation: Daughter  Code Status:   Allergies: Review of patient's allergies indicates no known allergies.  Chief Complaint  Patient presents with  . New Admit To SNF    HPI: Patient is 81 y.o.[place]  female with history of hypertension, GERD, COPD, arthritis, recent right ankle fracture (sent from ED with conservative management) presented with progressive shortness of breath for 7 days duration associated with nonproductive cough. Patient has been mostly nonambulatory following her recent right ankle fracture about a month back.In the ED patient was found to have a low burden acute subsegmental PE over the right lower lung without evidence of pulmonary arterial hypertension or right heart strain. Patient admitted from 9/6-9 for IV heparin which was switched to eliquis for 6 months. Hospitalization was complicated by acute COPD exacerbation. Pt is admitted to SNF for generalized weakness for  OT/PT. While at SNF pt will be followed for HTN, tx with HCTZ and diovan, GERD, tx with nexium and arthritis, tx with tylenol.  Past Medical History  Diagnosis Date  . COPD (chronic obstructive pulmonary disease)   . Asthma   . Hypertension   . GERD (gastroesophageal reflux disease)   . Arthritis     Past Surgical History  Procedure Laterality Date  . Cholecystectomy        Medication List       This list is accurate as of: 11/05/14 11:59  PM.  Always use your most recent med list.               acetaminophen 500 MG tablet  Commonly known as:  TYLENOL  Take 500-1,000 mg by mouth every 6 (six) hours as needed for mild pain or moderate pain.     albuterol 108 (90 BASE) MCG/ACT inhaler  Commonly known as:  PROVENTIL HFA;VENTOLIN HFA  Inhale 1-2 puffs into the lungs every 6 (six) hours as needed for wheezing or shortness of breath.     albuterol (2.5 MG/3ML) 0.083% nebulizer solution  Commonly known as:  PROVENTIL  Take 2.5 mg by nebulization every 6 (six) hours as needed for wheezing or shortness of breath.     apixaban 5 MG Tabs tablet  Commonly known as:  ELIQUIS  Take 2 tablets (10 mg total) by mouth 2 (two) times daily.     apixaban 5 MG Tabs tablet  Commonly known as:  ELIQUIS  Take 1 tablet (5 mg total) by mouth 2 (two) times daily.     esomeprazole 40 MG capsule  Commonly known as:  NEXIUM  Take 40 mg by mouth daily at 12 noon.     Fluticasone-Salmeterol 500-50 MCG/DOSE Aepb  Commonly known as:  ADVAIR  Inhale 1 puff into the lungs 2 (two) times daily.     hydrochlorothiazide 12.5 MG capsule  Commonly known as:  MICROZIDE  Take 12.5 mg by mouth daily.     montelukast 10 MG tablet  Commonly known as:  SINGULAIR  Take 10 mg by mouth at bedtime.     valsartan 80 MG tablet  Commonly known as:  DIOVAN  Take 80 mg by mouth daily.        No orders of the defined types were placed in this encounter.    Immunization History  Administered Date(s) Administered  . Influenza,inj,Quad PF,36+ Mos 10/31/2014    Social History  Substance Use Topics  . Smoking status: Former Smoker    Quit date: 02/24/1971  . Smokeless tobacco: Never Used  . Alcohol Use: No    Family history is + CA, DM2   Review of Systems  DATA OBTAINED: from patient, nurse, daughter GENERAL:  no fevers, fatigue, appetite changes SKIN: No itching, rash or wounds EYES: No eye pain, redness, discharge EARS: No earache,  tinnitus, change in hearing NOSE: No congestion, drainage or bleeding  MOUTH/THROAT: No mouth or tooth pain, No sore throat RESPIRATORY: No cough, wheezing, SOB CARDIAC: No chest pain, palpitations, lower extremity edema  GI: No abdominal pain, No N/V/D or constipation, No heartburn or reflux  GU: No dysuria, frequency or urgency, or incontinence  MUSCULOSKELETAL: No unrelieved bone/joint pain NEUROLOGIC: No headache, dizziness or focal weakness PSYCHIATRIC: No c/o anxiety or sadness   Filed Vitals:   11/05/14 2119  BP: 163/80  Pulse: 80  Temp: 97.8 F (36.6 C)  Resp: 18    SpO2 Readings from Last 1 Encounters:  11/02/14 100%        Physical Exam  GENERAL APPEARANCE: Alert, conversant,  No acute distress.  SKIN: No diaphoresis rash HEAD: Normocephalic, atraumatic  EYES: Conjunctiva/lids clear. Pupils round, reactive. EOMs intact.  EARS: External exam WNL, canals clear. Hearing grossly normal.  NOSE: No deformity or discharge.  MOUTH/THROAT: Lips w/o lesions  RESPIRATORY: Breathing is even, unlabored. Lung sounds are clear   CARDIOVASCULAR: Heart RRR no murmurs, rubs or gallops. No peripheral edema.   GASTROINTESTINAL: Abdomen is soft, non-tender, not distended w/ normal bowel sounds. GENITOURINARY: Bladder non tender, not distended  MUSCULOSKELETAL: R leg in splint NEUROLOGIC:  Cranial nerves 2-12 grossly intact. Moves all extremities  PSYCHIATRIC: Mood and affect appropriate to situation, no behavioral issues  Patient Active Problem List   Diagnosis Date Noted  . Acute encephalopathy 11/10/2014  . Closed right fibular fracture 11/02/2014  . Right thyroid nodule 11/02/2014  . PE (pulmonary embolism) 10/30/2014  . Acute on chronic respiratory failure 10/30/2014  . Closed right ankle fracture 10/30/2014  . COPD with acute exacerbation   . Asthma   . Hypertension   . GERD (gastroesophageal reflux disease)   . Arthritis   . COLD (chronic obstructive lung disease)    . Essential hypertension   . Gastroesophageal reflux disease without esophagitis     CBC    Component Value Date/Time   WBC 11.7* 11/01/2014 0520   RBC 3.54* 11/01/2014 0520   HGB 10.8* 11/01/2014 0520   HCT 32.4* 11/01/2014 0520   PLT 250 11/01/2014 0520   MCV 91.5 11/01/2014 0520   LYMPHSABS 2.6 10/30/2014 1809   MONOABS 0.4 10/30/2014 1809   EOSABS 0.3 10/30/2014 1809   BASOSABS 0.0 10/30/2014 1809    CMP     Component Value Date/Time   NA 136 10/31/2014 0515   K 3.7 10/31/2014 0515   CL 103 10/31/2014 0515   CO2 23 10/31/2014 0515   GLUCOSE 145* 10/31/2014 0515   BUN 16 10/31/2014 0515   CREATININE 0.79 10/31/2014 0515   CALCIUM 9.7 10/31/2014 0515   PROT 6.8 10/31/2014 0515   ALBUMIN 3.6 10/31/2014 0515   AST 22 10/31/2014 0515  ALT 22 10/31/2014 0515   ALKPHOS 69 10/31/2014 0515   BILITOT 0.7 10/31/2014 0515   GFRNONAA >60 10/31/2014 0515   GFRAA >60 10/31/2014 0515    No results found for: HGBA1C   Dg Chest 2 View  10/30/2014   CLINICAL DATA:  Increasing shortness of breath for 1 week history of COPD  EXAM: CHEST  2 VIEW  COMPARISON:  10/01/2014  FINDINGS: Stable heart size, within normal limits. Stable mild bronchitic change. Vascular pattern normal. Lungs clear. No effusions.  IMPRESSION: No active cardiopulmonary disease.   Electronically Signed   By: Esperanza Heir M.D.   On: 10/30/2014 18:41   Dg Ankle Complete Right  10/31/2014   CLINICAL DATA:  81 year old female with right ankle fracture.  EXAM: RIGHT ANKLE - COMPLETE 3+ VIEW  COMPARISON:  Radiograph dated 10/01/2014  FINDINGS: Evaluation of the study is limited due to chest overlying the ankle. A minimally displaced oblique fracture of the distal fibula is again seen. The tibiotalar articulation is preserved. The soft tissues are unremarkable.  IMPRESSION: Minimally displaced oblique fracture of the distal fibula.   Electronically Signed   By: Elgie Collard M.D.   On: 10/31/2014 19:00   Ct Angio  Chest Pe W/cm &/or Wo Cm  10/30/2014   CLINICAL DATA:  81 year old female with a reported history asthma, wheezing and shortness of breath. Chest pain.  EXAM: CT ANGIOGRAPHY CHEST WITH CONTRAST  TECHNIQUE: Multidetector CT imaging of the chest was performed using the standard protocol during bolus administration of intravenous contrast. Multiplanar CT image reconstructions and MIPs were obtained to evaluate the vascular anatomy.  CONTRAST:  OMNIPAQUE IOHEXOL 350 MG/ML SOLN  COMPARISON:  Chest radiograph from earlier today.  FINDINGS: Mediastinum/Nodes: Normal heart size. No dilatation of the right ventricle or right atrium. Normal position of the interventricular septum. No contrast reflux into the IVC. No pericardial fluid/thickening. Atherosclerotic nonaneurysmal thoracic aorta. Normal caliber main pulmonary artery. There are subsegmental acute pulmonary emboli in the right lower lobe (series 10/ image 162) and possibly in the left upper lobe (10/77). No central pulmonary emboli. Hypodense 1.6 cm right thyroid lobe nodule. Normal esophagus. No axillary, mediastinal or hilar lymphadenopathy.  Lungs/Pleura: No pneumothorax. No pleural effusion. No significant pulmonary nodules, lung masses or acute consolidative airspace disease. Mild subpleural reticulation in the dependent lower lobes is likely due to hypoventilation.  Upper abdomen: Tiny hiatal hernia.  Musculoskeletal: Moth-eaten appearance of the bones, with no focal aggressive osseous lesion.  Review of the MIP images confirms the above findings.  IMPRESSION: 1. Low burden acute subsegmental pulmonary embolism. No CT evidence of pulmonary arterial hypertension or right heart strain. 2. Moth-eaten appearance of the bones, a nonspecific finding that could be due to osteopenia or infiltrative osseous process. 3. Tiny hiatal hernia. Critical Value/emergent results were called by telephone at the time of interpretation on 10/30/2014 at 7:56 pm to PA Arthor Captain, who verbally acknowledged these results.   Electronically Signed   By: Delbert Phenix M.D.   On: 10/30/2014 19:58    Not all labs, radiology exams or other studies done during hospitalization come through on my EPIC note; however they are reviewed by me.    Assessment and Plan  PE (pulmonary embolism) Possibly associated with lower extremity DVT from recent right ankle fracture and mobility. Patient started on IV heparin drip on admission. Doppler lower extremity negative for DVT. 2-D echo unremarkable. Switched over to Eliquis plan to treat for 6 months duration. (Stop date  05/02/2015) SNF - cont eliquis  COPD with acute exacerbation Patient had mild wheezing on admission. Placed on when necessary DuoNeb and daily prednisone. Will discontinue prednisone given acute encephalopathy on 9/8. (Symptoms have subsided and has received 3 days of steroid already) SNF - cont singulaire and advair with prn nebs  Acute encephalopathy Patient found to be pleasantly confused. Afebrile. UA unremarkable. Chest x-ray on admission negative for infiltrate. Clinically monitored and has subsided on its own  Closed right fibular fracture Following a mechanical fall at home about 5 weeks back. (Reportedly has had multiple falls at home). X-ray showed mildly displaced oblique fracture through the distal fibula. repeat x-ray showing mildly displaced fibular fracture . -Patient has not required any pain medications while at rest. Given her confusion I have avoided narcotics. If he requires any pain medications with therapy recommend to use Tylenol followed by low-dose tramadol or Vicodin. -Spoke with orthopedics Dr. Langston Masker who evaluated the x-ray and recommends L and U posterior spent with plaster with 14 neutral position. Recommends nonweightbearing with crutches. Past cast will be applied prior to discharge; Patient should follow-up with him on 9/13. SNF - OT/PT  Hypertension SNF -Continue HCTZ and  Diovan.   GERD (gastroesophageal reflux disease) SNF - cont nexium40 mg  Right thyroid nodule SNF - check TSH and free T3/T4 sent on 9/9. ultrasound of her thyroid done prior to discharge given findings of right lobe of thyroid mass seen on head CT on 10/01/2014.  Arthritis SNF - cont  tylenol prn   Time spent > 45 min Margit Hanks, MD

## 2014-11-06 LAB — BASIC METABOLIC PANEL
BUN: 16 mg/dL (ref 4–21)
Creatinine: 0.9 mg/dL (ref 0.5–1.1)
GLUCOSE: 110 mg/dL
POTASSIUM: 3.8 mmol/L (ref 3.4–5.3)
SODIUM: 136 mmol/L — AB (ref 137–147)

## 2014-11-06 LAB — CBC AND DIFFERENTIAL
HCT: 37 % (ref 36–46)
HEMOGLOBIN: 11.8 g/dL — AB (ref 12.0–16.0)
Platelets: 222 10*3/uL (ref 150–399)
WBC: 6.5 10^3/mL

## 2014-11-10 ENCOUNTER — Encounter: Payer: Self-pay | Admitting: Internal Medicine

## 2014-11-10 DIAGNOSIS — G9340 Encephalopathy, unspecified: Secondary | ICD-10-CM | POA: Insufficient documentation

## 2014-11-10 NOTE — Assessment & Plan Note (Signed)
SNF - check TSH and free T3/T4 sent on 9/9. ultrasound of her thyroid done prior to discharge given findings of right lobe of thyroid mass seen on head CT on 10/01/2014.

## 2014-11-10 NOTE — Assessment & Plan Note (Signed)
Patient had mild wheezing on admission. Placed on when necessary DuoNeb and daily prednisone. Will discontinue prednisone given acute encephalopathy on 9/8. (Symptoms have subsided and has received 3 days of steroid already) SNF - cont singulaire and advair with prn nebs

## 2014-11-10 NOTE — Assessment & Plan Note (Signed)
Following a mechanical fall at home about 5 weeks back. (Reportedly has had multiple falls at home). X-ray showed mildly displaced oblique fracture through the distal fibula. repeat x-ray showing mildly displaced fibular fracture . -Patient has not required any pain medications while at rest. Given her confusion I have avoided narcotics. If he requires any pain medications with therapy recommend to use Tylenol followed by low-dose tramadol or Vicodin. -Spoke with orthopedics Dr. Langston Masker who evaluated the x-ray and recommends L and U posterior spent with plaster with 14 neutral position. Recommends nonweightbearing with crutches. Past cast will be applied prior to discharge; Patient should follow-up with him on 9/13. SNF - OT/PT

## 2014-11-10 NOTE — Assessment & Plan Note (Signed)
Patient found to be pleasantly confused. Afebrile. UA unremarkable. Chest x-ray on admission negative for infiltrate. Clinically monitored and has subsided on its own

## 2014-11-10 NOTE — Assessment & Plan Note (Signed)
Possibly associated with lower extremity DVT from recent right ankle fracture and mobility. Patient started on IV heparin drip on admission. Doppler lower extremity negative for DVT. 2-D echo unremarkable. Switched over to Eliquis plan to treat for 6 months duration. (Stop date 05/02/2015) SNF - cont eliquis

## 2014-11-10 NOTE — Assessment & Plan Note (Signed)
SNF - cont  tylenol prn

## 2014-11-10 NOTE — Assessment & Plan Note (Signed)
SNF -Continue HCTZ and Diovan.

## 2014-11-10 NOTE — Assessment & Plan Note (Signed)
SNF - cont nexium 40 mg 

## 2014-11-27 DIAGNOSIS — S8264XA Nondisplaced fracture of lateral malleolus of right fibula, initial encounter for closed fracture: Secondary | ICD-10-CM | POA: Diagnosis not present

## 2014-11-27 DIAGNOSIS — M25571 Pain in right ankle and joints of right foot: Secondary | ICD-10-CM | POA: Diagnosis not present

## 2014-12-25 DIAGNOSIS — K219 Gastro-esophageal reflux disease without esophagitis: Secondary | ICD-10-CM | POA: Diagnosis not present

## 2014-12-25 DIAGNOSIS — M6281 Muscle weakness (generalized): Secondary | ICD-10-CM | POA: Diagnosis not present

## 2014-12-25 DIAGNOSIS — L03032 Cellulitis of left toe: Secondary | ICD-10-CM | POA: Diagnosis not present

## 2014-12-25 DIAGNOSIS — Z9181 History of falling: Secondary | ICD-10-CM | POA: Diagnosis not present

## 2014-12-25 DIAGNOSIS — J449 Chronic obstructive pulmonary disease, unspecified: Secondary | ICD-10-CM | POA: Diagnosis not present

## 2014-12-25 DIAGNOSIS — I2699 Other pulmonary embolism without acute cor pulmonale: Secondary | ICD-10-CM | POA: Diagnosis not present

## 2014-12-25 DIAGNOSIS — K5901 Slow transit constipation: Secondary | ICD-10-CM | POA: Diagnosis not present

## 2014-12-25 DIAGNOSIS — S8261XD Displaced fracture of lateral malleolus of right fibula, subsequent encounter for closed fracture with routine healing: Secondary | ICD-10-CM | POA: Diagnosis not present

## 2014-12-25 DIAGNOSIS — Z7901 Long term (current) use of anticoagulants: Secondary | ICD-10-CM | POA: Diagnosis not present

## 2014-12-25 DIAGNOSIS — E041 Nontoxic single thyroid nodule: Secondary | ICD-10-CM | POA: Diagnosis not present

## 2014-12-25 DIAGNOSIS — I10 Essential (primary) hypertension: Secondary | ICD-10-CM | POA: Diagnosis not present

## 2014-12-25 DIAGNOSIS — S82891D Other fracture of right lower leg, subsequent encounter for closed fracture with routine healing: Secondary | ICD-10-CM | POA: Diagnosis not present

## 2014-12-25 DIAGNOSIS — S8264XD Nondisplaced fracture of lateral malleolus of right fibula, subsequent encounter for closed fracture with routine healing: Secondary | ICD-10-CM | POA: Diagnosis not present

## 2015-01-04 ENCOUNTER — Non-Acute Institutional Stay (SKILLED_NURSING_FACILITY): Payer: Medicare Other | Admitting: Nurse Practitioner

## 2015-01-04 DIAGNOSIS — I10 Essential (primary) hypertension: Secondary | ICD-10-CM

## 2015-01-04 DIAGNOSIS — J449 Chronic obstructive pulmonary disease, unspecified: Secondary | ICD-10-CM

## 2015-01-04 DIAGNOSIS — S82891D Other fracture of right lower leg, subsequent encounter for closed fracture with routine healing: Secondary | ICD-10-CM

## 2015-01-04 DIAGNOSIS — I2699 Other pulmonary embolism without acute cor pulmonale: Secondary | ICD-10-CM

## 2015-01-04 DIAGNOSIS — K5901 Slow transit constipation: Secondary | ICD-10-CM

## 2015-01-04 DIAGNOSIS — K219 Gastro-esophageal reflux disease without esophagitis: Secondary | ICD-10-CM

## 2015-01-04 DIAGNOSIS — E041 Nontoxic single thyroid nodule: Secondary | ICD-10-CM | POA: Diagnosis not present

## 2015-01-04 NOTE — Progress Notes (Signed)
Patient ID: Leslie Farley, female   DOB: 1930/07/26, 81 y.o.   MRN: [id number]    Nursing Home Location:  [facility]   Place of Service: SNF (31)  PCP: No primary care provider on file.  No Known Allergies  Chief Complaint  Patient presents with  . Medical Management of Chronic Issues    HPI:  Patient is a 81 y.o. female [facility] at [facility] for routine follow up on chronic conditions. Pt with hx of COPD, HTN, GERD, PE, constipation. Recent ankle fracture and conts to work with thearpy. Pt reports pain well controlled. Does report constipation at this time.  No shortness of breath or chest pains.  Occasional GERD.  Nursing without acute concerns.   Review of Systems:  Review of Systems  Constitutional: Negative for activity change, appetite change, fatigue and unexpected weight change.  HENT: Negative for congestion and hearing loss.   Eyes: Negative.   Respiratory: Negative for cough and shortness of breath.   Cardiovascular: Negative for chest pain, palpitations and leg swelling.  Gastrointestinal: Positive for constipation. Negative for abdominal pain and diarrhea.       GERD  Genitourinary: Negative for dysuria and difficulty urinating.  Musculoskeletal: Positive for gait problem (ankle fx). Negative for myalgias and arthralgias.  Skin: Negative for color change and wound.  Neurological: Negative for dizziness and weakness.  Psychiatric/Behavioral: Negative for behavioral problems, confusion and agitation.    Past Medical History  Diagnosis Date  . COPD (chronic obstructive pulmonary disease)   . Asthma   . Hypertension   . GERD (gastroesophageal reflux disease)   . Arthritis    Past Surgical History  Procedure Laterality Date  . Cholecystectomy     Social History:   reports that she quit smoking about 43 years ago. She has never used smokeless tobacco. She reports that she does not drink alcohol or use illicit  drugs.  Family History  Problem Relation Age of Onset  . Cancer - Other Mother     Died of throat cancer  . Cancer - Prostate Father   . Diabetes Brother     Medications: Patient's Medications  New Prescriptions   No medications on file  Previous Medications   ACETAMINOPHEN (TYLENOL) 500 MG TABLET    Take 500-1,000 mg by mouth every 6 (six) hours as needed for mild pain or moderate pain.   ALBUTEROL (PROVENTIL HFA;VENTOLIN HFA) 108 (90 BASE) MCG/ACT INHALER    Inhale 1-2 puffs into the lungs every 6 (six) hours as needed for wheezing or shortness of breath.   ALBUTEROL (PROVENTIL) (2.5 MG/3ML) 0.083% NEBULIZER SOLUTION    Take 2.5 mg by nebulization every 6 (six) hours as needed for wheezing or shortness of breath.   APIXABAN (ELIQUIS) 5 MG TABS TABLET    Take 1 tablet (5 mg total) by mouth 2 (two) times daily.   ESOMEPRAZOLE (NEXIUM) 40 MG CAPSULE    Take 40 mg by mouth daily at 12 noon.   FLUTICASONE-SALMETEROL (ADVAIR) 500-50 MCG/DOSE AEPB    Inhale 1 puff into the lungs 2 (two) times daily.   HYDROCHLOROTHIAZIDE (MICROZIDE) 12.5 MG CAPSULE    Take 12.5 mg by mouth daily.   MONTELUKAST (SINGULAIR) 10 MG TABLET    Take 10 mg by mouth at bedtime.   VALSARTAN (DIOVAN) 80 MG TABLET    Take 80 mg by mouth daily.  Modified Medications   No medications on file  Discontinued Medications   APIXABAN (ELIQUIS) 5  MG TABS TABLET    Take 2 tablets (10 mg total) by mouth 2 (two) times daily.     Physical Exam: Filed Vitals:   01/04/15 1347  BP: 130/74  Pulse: 52  Temp: 97.8 F (36.6 C)  Resp: 20  Weight: 198 lb (89.812 kg)    Physical Exam  Constitutional: She is oriented to person, place, and time. She appears well-developed and well-nourished. No distress.  HENT:  Head: Normocephalic and atraumatic.  Mouth/Throat: Oropharynx is clear and moist. No oropharyngeal exudate.  Eyes: Conjunctivae are normal. Pupils are equal, round, and reactive to light.  Neck: Normal range of motion.  Neck supple.  Cardiovascular: Normal rate, regular rhythm and normal heart sounds.   Pulmonary/Chest: Effort normal and breath sounds normal.  Abdominal: Soft. Bowel sounds are normal.  Musculoskeletal: She exhibits no edema or tenderness.  Right ankle in boot  Neurological: She is alert and oriented to person, place, and time.  Skin: Skin is warm and dry. She is not diaphoretic.  Psychiatric: She has a normal mood and affect.    Labs reviewed: Basic Metabolic Panel:  Recent Labs  56/21/30 0039 10/30/14 1809 10/31/14 0515  NA 138 139 136  K 3.7 3.6 3.7  CL 107 103 103  CO2 GLUCOSE 130* 116* 145*  BUN 21* 13 16  CREATININE 1.01* 0.84 0.79  CALCIUM 9.3 10.3 9.7   Liver Function Tests:  Recent Labs  10/01/14 0039 10/31/14 0515  AST 46* 22  ALT 34 22  ALKPHOS 57 69  BILITOT 0.4 0.7  PROT 6.9 6.8  ALBUMIN 3.9 3.6   No results for input(s): LIPASE, AMYLASE in the last 8760 hours. No results for input(s): AMMONIA in the last 8760 hours. CBC:  Recent Labs  10/01/14 0039 10/30/14 1809 10/31/14 0515 11/01/14 0520  WBC 7.7 7.7 5.6 11.7*  NEUTROABS 5.6 4.4  --   --   HGB 11.3* 13.3 11.5* 10.8*  HCT 34.4* 39.6 33.9* 32.4*  MCV 91.2 92.3 90.9 91.5  PLT 230 211 226 250   TSH:  Recent Labs  11/02/14 1004  TSH 0.958   A1C: No results found for: HGBA1C Lipid Panel: No results for input(s): CHOL, HDL, LDLCALC, TRIG, CHOLHDL, LDLDIRECT in the last 8760 hours.  Radiological Exams: Dg Chest 2 View  10/30/2014  CLINICAL DATA:  Increasing shortness of breath for 1 week history of COPD EXAM: CHEST  2 VIEW COMPARISON:  10/01/2014 FINDINGS: Stable heart size, within normal limits. Stable mild bronchitic change. Vascular pattern normal. Lungs clear. No effusions. IMPRESSION: No active cardiopulmonary disease. Electronically Signed   By: Esperanza Heir M.D.   On: 10/30/2014 18:41   Dg Ankle Complete Right  10/31/2014  CLINICAL DATA:  81 year old female with  right ankle fracture. EXAM: RIGHT ANKLE - COMPLETE 3+ VIEW COMPARISON:  Radiograph dated 10/01/2014 FINDINGS: Evaluation of the study is limited due to chest overlying the ankle. A minimally displaced oblique fracture of the distal fibula is again seen. The tibiotalar articulation is preserved. The soft tissues are unremarkable. IMPRESSION: Minimally displaced oblique fracture of the distal fibula. Electronically Signed   By: Elgie Collard M.D.   On: 10/31/2014 19:00   Ct Angio Chest Pe W/cm &/or Wo Cm  10/30/2014  CLINICAL DATA:  81 year old female with a reported history asthma, wheezing and shortness of breath. Chest pain. EXAM: CT ANGIOGRAPHY CHEST WITH CONTRAST TECHNIQUE: Multidetector CT imaging of the chest was performed using the standard protocol during bolus administration  of intravenous contrast. Multiplanar CT image reconstructions and MIPs were obtained to evaluate the vascular anatomy. CONTRAST:  OMNIPAQUE IOHEXOL 350 MG/ML SOLN COMPARISON:  Chest radiograph from earlier today. FINDINGS: Mediastinum/Nodes: Normal heart size. No dilatation of the right ventricle or right atrium. Normal position of the interventricular septum. No contrast reflux into the IVC. No pericardial fluid/thickening. Atherosclerotic nonaneurysmal thoracic aorta. Normal caliber main pulmonary artery. There are subsegmental acute pulmonary emboli in the right lower lobe (series 10/ image 162) and possibly in the left upper lobe (10/77). No central pulmonary emboli. Hypodense 1.6 cm right thyroid lobe nodule. Normal esophagus. No axillary, mediastinal or hilar lymphadenopathy. Lungs/Pleura: No pneumothorax. No pleural effusion. No significant pulmonary nodules, lung masses or acute consolidative airspace disease. Mild subpleural reticulation in the dependent lower lobes is likely due to hypoventilation. Upper abdomen: Tiny hiatal hernia. Musculoskeletal: Moth-eaten appearance of the bones, with no focal aggressive osseous  lesion. Review of the MIP images confirms the above findings. IMPRESSION: 1. Low burden acute subsegmental pulmonary embolism. No CT evidence of pulmonary arterial hypertension or right heart strain. 2. Moth-eaten appearance of the bones, a nonspecific finding that could be due to osteopenia or infiltrative osseous process. 3. Tiny hiatal hernia. Critical Value/emergent results were called by telephone at the time of interpretation on 10/30/2014 at 7:56 pm to PA Arthor Captain, who verbally acknowledged these results. Electronically Signed   By: Delbert Phenix M.D.   On: 10/30/2014 19:58    Assessment/Plan 1. Chronic obstructive pulmonary disease, unspecified COPD type (HCC) Exacerbation on prior hospital admission. Has been doing well since at [facility]. Cont on advair and singulair with PRN  2. Essential hypertension Blood pressure well controlled on current regimen. To cont Valsartan.   3. Gastroesophageal reflux disease without esophagitis conts on nexium 40 mg daily, also uses TUMs as needed with good relief   4. Right thyroid nodule Noted during hospitalization, not needing biopsy and TSH stable  5. Closed right ankle fracture, with routine healing, subsequent encounter -conts with boot and follow up with ortho  6. PE (pulmonary embolism) No signs of recurrence, conts on eliquis 5 mg BID  7. Slow transit constipation -will add senna S PO QHS, encouraged good hydration     Abraham Entwistle K. Biagio Borg  [facility] [phone] 8 am - 5 pm) [phone] (after hours)

## 2015-01-08 DIAGNOSIS — S8264XD Nondisplaced fracture of lateral malleolus of right fibula, subsequent encounter for closed fracture with routine healing: Secondary | ICD-10-CM | POA: Diagnosis not present

## 2015-01-10 DIAGNOSIS — L03032 Cellulitis of left toe: Secondary | ICD-10-CM | POA: Diagnosis not present

## 2015-01-21 ENCOUNTER — Non-Acute Institutional Stay (SKILLED_NURSING_FACILITY): Payer: Medicare Other | Admitting: Internal Medicine

## 2015-01-21 ENCOUNTER — Encounter: Payer: Self-pay | Admitting: Internal Medicine

## 2015-01-21 DIAGNOSIS — I10 Essential (primary) hypertension: Secondary | ICD-10-CM

## 2015-01-21 DIAGNOSIS — K219 Gastro-esophageal reflux disease without esophagitis: Secondary | ICD-10-CM | POA: Diagnosis not present

## 2015-01-21 DIAGNOSIS — I2699 Other pulmonary embolism without acute cor pulmonale: Secondary | ICD-10-CM | POA: Diagnosis not present

## 2015-01-21 DIAGNOSIS — E041 Nontoxic single thyroid nodule: Secondary | ICD-10-CM | POA: Diagnosis not present

## 2015-01-21 DIAGNOSIS — J449 Chronic obstructive pulmonary disease, unspecified: Secondary | ICD-10-CM | POA: Diagnosis not present

## 2015-01-21 DIAGNOSIS — K5901 Slow transit constipation: Secondary | ICD-10-CM

## 2015-01-21 NOTE — Progress Notes (Signed)
Patient ID: Leslie Farley, female   DOB: 03/20/1930, 81 y.o.   MRN: [id number]    DATE: 01/21/15  Location:  [facility]    Place of Service: SNF (31)   Extended Emergency Contact Information Primary Emergency Contact: Guillaume,Nathalie Address: [address], [place] [address] [place] Phone: [phone] Mobile Phone: [phone] Relation: Daughter Secondary Emergency Contact: Roby Lofts [place] Phone: [phone] Mobile Phone: [phone] Relation: Daughter  Advanced Directive information  FULL CODE  Chief Complaint  Patient presents with  . Discharge Note    HPI:  81 yo female seen today for d/c from SNF following short term rehab for COPD, PE, arthritis. She will need home health PT/OT a she is home bound and cannot leave home safely without assistance of another person. She has unsteady gait due to painful joints form arthritis. She will continue to need  PT/OT for gait training, ADL training and safe transfers. DME req'd includes rolling walker, tub bench, and 3-in-1 commode.  Discussed plan with Lelan Pons, pt's daughter, via phone while face-to-face with pt. Pt is french speaking. Hx obtained from daughter  Past Medical History  Diagnosis Date  . COPD (chronic obstructive pulmonary disease) ([place])   . Asthma   . Hypertension   . GERD (gastroesophageal reflux disease)   . Arthritis     Past Surgical History  Procedure Laterality Date  . Cholecystectomy      No care team member to display  Social History   Social History  . Marital Status: Single    Spouse Name: N/A  . Number of Children: N/A  . Years of Education: N/A   Occupational History  . Not on file.   Social History Main Topics  . Smoking status: Former Smoker    Quit date: 02/24/1971  . Smokeless tobacco: Never Used  . Alcohol Use: No  . Drug Use: No  . Sexual Activity: Not on file   Other Topics  Concern  . Not on file   Social History Narrative     reports that she quit smoking about 43 years ago. She has never used smokeless tobacco. She reports that she does not drink alcohol or use illicit drugs.  Immunization History  Administered Date(s) Administered  . Influenza,inj,Quad PF,36+ Mos 10/31/2014    No Known Allergies  Medications: Patient's Medications  New Prescriptions   No medications on file  Previous Medications   ACETAMINOPHEN (TYLENOL) 500 MG TABLET    Take 500-1,000 mg by mouth every 6 (six) hours as needed for mild pain or moderate pain.   ALBUTEROL (PROVENTIL HFA;VENTOLIN HFA) 108 (90 BASE) MCG/ACT INHALER    Inhale 1-2 puffs into the lungs every 6 (six) hours as needed for wheezing or shortness of breath.   ALBUTEROL (PROVENTIL) (2.5 MG/3ML) 0.083% NEBULIZER SOLUTION    Take 2.5 mg by nebulization every 6 (six) hours as needed for wheezing or shortness of breath.   APIXABAN (ELIQUIS) 5 MG TABS TABLET    Take 1 tablet (5 mg total) by mouth 2 (two) times daily.   ESOMEPRAZOLE (NEXIUM) 40 MG CAPSULE    Take 40 mg by mouth daily at 12 noon.   FLUTICASONE-SALMETEROL (ADVAIR) 500-50 MCG/DOSE AEPB    Inhale 1 puff into the lungs 2 (two) times daily.   HYDROCHLOROTHIAZIDE (MICROZIDE) 12.5 MG CAPSULE    Take 12.5 mg by mouth daily.   MONTELUKAST (SINGULAIR)  10 MG TABLET    Take 10 mg by mouth at bedtime.   VALSARTAN (DIOVAN) 80 MG TABLET    Take 80 mg by mouth daily.  Modified Medications   No medications on file  Discontinued Medications   No medications on file    Review of Systems  Unable to perform ROS: Other    Filed Vitals:   01/21/15 2146  BP: 137/77  Pulse: 77  Temp: 98 F (36.7 C)  Weight: 198 lb (89.812 kg)   Body mass index is 32.95 kg/(m^2).  Physical Exam  Constitutional: She appears well-developed and well-nourished.  Musculoskeletal: Normal range of motion.  Neurological: She is alert.  Skin: No rash noted.  Psychiatric: She has a  normal mood and affect. Her behavior is normal.     Labs reviewed: Admission on 10/30/2014, Discharged on 11/02/2014  Component Date Value Ref Range Status  . WBC 10/30/2014 7.7  4.0 - 10.5 K/uL Final  . RBC 10/30/2014 4.29  3.87 - 5.11 MIL/uL Final  . Hemoglobin 10/30/2014 13.3  12.0 - 15.0 g/dL Final  . HCT 10/30/2014 39.6  36.0 - 46.0 % Final  . MCV 10/30/2014 92.3  78.0 - 100.0 fL Final  . MCH 10/30/2014 31.0  26.0 - 34.0 pg Final  . MCHC 10/30/2014 33.6  30.0 - 36.0 g/dL Final  . RDW 10/30/2014 14.4  11.5 - 15.5 % Final  . Platelets 10/30/2014 211  150 - 400 K/uL Final  . Neutrophils Relative % 10/30/2014 58  43 - 77 % Final  . Neutro Abs 10/30/2014 4.4  1.7 - 7.7 K/uL Final  . Lymphocytes Relative 10/30/2014 34  12 - 46 % Final  . Lymphs Abs 10/30/2014 2.6  0.7 - 4.0 K/uL Final  . Monocytes Relative 10/30/2014 5  3 - 12 % Final  . Monocytes Absolute 10/30/2014 0.4  0.1 - 1.0 K/uL Final  . Eosinophils Relative 10/30/2014 3  0 - 5 % Final  . Eosinophils Absolute 10/30/2014 0.3  0.0 - 0.7 K/uL Final  . Basophils Relative 10/30/2014 0  0 - 1 % Final  . Basophils Absolute 10/30/2014 0.0  0.0 - 0.1 K/uL Final  . Sodium 10/30/2014 139  135 - 145 mmol/L Final  . Potassium 10/30/2014 3.6  3.5 - 5.1 mmol/L Final  . Chloride 10/30/2014 103  101 - 111 mmol/L Final  . CO2 10/30/2014 28  22 - 32 mmol/L Final  . Glucose, Bld 10/30/2014 116* 65 - 99 mg/dL Final  . BUN 10/30/2014 13  6 - 20 mg/dL Final  . Creatinine, Ser 10/30/2014 0.84  0.44 - 1.00 mg/dL Final  . Calcium 10/30/2014 10.3  8.9 - 10.3 mg/dL Final  . GFR calc non Af Amer 10/30/2014 >60  >60 mL/min Final  . GFR calc Af Amer 10/30/2014 >60  >60 mL/min Final   Comment: (NOTE) The eGFR has been calculated using the CKD EPI equation. This calculation has not been validated in all clinical situations. eGFR's persistently <60 mL/min signify possible Chronic Kidney Disease.   . Anion gap 10/30/2014 8  5 - 15 Final  . Troponin i,  poc 10/30/2014 0.01  0.00 - 0.08 ng/mL Final  . Comment 3 10/30/2014          Final   Comment: Due to the release kinetics of cTnI, a negative result within the first hours of the onset of symptoms does not rule out myocardial infarction with certainty. If myocardial infarction is still suspected, repeat the test  at appropriate intervals.   . B Natriuretic Peptide 10/30/2014 41.6  0.0 - 100.0 pg/mL Final  . WBC 10/31/2014 5.6  4.0 - 10.5 K/uL Final  . RBC 10/31/2014 3.73* 3.87 - 5.11 MIL/uL Final  . Hemoglobin 10/31/2014 11.5* 12.0 - 15.0 g/dL Final  . HCT 10/31/2014 33.9* 36.0 - 46.0 % Final  . MCV 10/31/2014 90.9  78.0 - 100.0 fL Final  . MCH 10/31/2014 30.8  26.0 - 34.0 pg Final  . MCHC 10/31/2014 33.9  30.0 - 36.0 g/dL Final  . RDW 10/31/2014 14.3  11.5 - 15.5 % Final  . Platelets 10/31/2014 226  150 - 400 K/uL Final  . Heparin Unfractionated 10/31/2014 1.76* 0.30 - 0.70 IU/mL Final   Comment: RESULTS CONFIRMED BY MANUAL DILUTION        IF HEPARIN RESULTS ARE BELOW EXPECTED VALUES, AND PATIENT DOSAGE HAS BEEN CONFIRMED, SUGGEST FOLLOW UP TESTING OF ANTITHROMBIN III LEVELS.   Marland Kitchen Prothrombin Time 10/30/2014 14.3  11.6 - 15.2 seconds Final  . INR 10/30/2014 1.09  0.00 - 1.49 Final  . aPTT 10/30/2014 28  24 - 37 seconds Final  . Sodium 10/31/2014 136  135 - 145 mmol/L Final  . Potassium 10/31/2014 3.7  3.5 - 5.1 mmol/L Final  . Chloride 10/31/2014 103  101 - 111 mmol/L Final  . CO2 10/31/2014 23  22 - 32 mmol/L Final  . Glucose, Bld 10/31/2014 145* 65 - 99 mg/dL Final  . BUN 10/31/2014 16  6 - 20 mg/dL Final  . Creatinine, Ser 10/31/2014 0.79  0.44 - 1.00 mg/dL Final  . Calcium 10/31/2014 9.7  8.9 - 10.3 mg/dL Final  . Total Protein 10/31/2014 6.8  6.5 - 8.1 g/dL Final  . Albumin 10/31/2014 3.6  3.5 - 5.0 g/dL Final  . AST 10/31/2014 22  15 - 41 U/L Final  . ALT 10/31/2014 22  14 - 54 U/L Final  . Alkaline Phosphatase 10/31/2014 69  38 - 126 U/L Final  . Total Bilirubin  10/31/2014 0.7  0.3 - 1.2 mg/dL Final  . GFR calc non Af Amer 10/31/2014 >60  >60 mL/min Final  . GFR calc Af Amer 10/31/2014 >60  >60 mL/min Final   Comment: (NOTE) The eGFR has been calculated using the CKD EPI equation. This calculation has not been validated in all clinical situations. eGFR's persistently <60 mL/min signify possible Chronic Kidney Disease.   . Anion gap 10/31/2014 10  5 - 15 Final  . WBC 11/01/2014 11.7* 4.0 - 10.5 K/uL Final  . RBC 11/01/2014 3.54* 3.87 - 5.11 MIL/uL Final  . Hemoglobin 11/01/2014 10.8* 12.0 - 15.0 g/dL Final  . HCT 11/01/2014 32.4* 36.0 - 46.0 % Final  . MCV 11/01/2014 91.5  78.0 - 100.0 fL Final  . MCH 11/01/2014 30.5  26.0 - 34.0 pg Final  . MCHC 11/01/2014 33.3  30.0 - 36.0 g/dL Final  . RDW 11/01/2014 14.6  11.5 - 15.5 % Final  . Platelets 11/01/2014 250  150 - 400 K/uL Final  . Glucose-Capillary 10/31/2014 145* 65 - 99 mg/dL Final  . Comment 1 10/31/2014 Notify RN   Final  . Comment 2 10/31/2014 Document in Chart   Final  . Color, Urine 11/02/2014 STRAW* YELLOW Final  . APPearance 11/02/2014 CLEAR  CLEAR Final  . Specific Gravity, Urine 11/02/2014 1.010  1.005 - 1.030 Final  . pH 11/02/2014 6.0  5.0 - 8.0 Final  . Glucose, UA 11/02/2014 NEGATIVE  NEGATIVE mg/dL Final  . Hgb  urine dipstick 11/02/2014 NEGATIVE  NEGATIVE Final  . Bilirubin Urine 11/02/2014 NEGATIVE  NEGATIVE Final  . Ketones, ur 11/02/2014 NEGATIVE  NEGATIVE mg/dL Final  . Protein, ur 11/02/2014 NEGATIVE  NEGATIVE mg/dL Final  . Urobilinogen, UA 11/02/2014 0.2  0.0 - 1.0 mg/dL Final  . Nitrite 11/02/2014 NEGATIVE  NEGATIVE Final  . Leukocytes, UA 11/02/2014 NEGATIVE  NEGATIVE Final   MICROSCOPIC NOT DONE ON URINES WITH NEGATIVE PROTEIN, BLOOD, LEUKOCYTES, NITRITE, OR GLUCOSE <1000 mg/dL.  Marland Kitchen Glucose-Capillary 11/01/2014 92  65 - 99 mg/dL Final  . Glucose-Capillary 11/02/2014 96  65 - 99 mg/dL Final  . TSH 11/02/2014 0.958  0.350 - 4.500 uIU/mL Final  . Free T4 11/02/2014  1.10  0.61 - 1.12 ng/dL Final   Performed at [facility]  . T3, Free 11/02/2014 1.9* 2.0 - 4.4 pg/mL Final   Comment: (NOTE) Performed At: [facility] [place], [place] [id number] Lindon Romp MD UX:[id number]     No results found.   Assessment/Plan   ICD-9-CM ICD-10-CM   1. PE (pulmonary embolism) 415.19 I26.99   2. Chronic obstructive pulmonary disease, unspecified COPD type ([place]) 496 J44.9   3. Essential hypertension 401.9 I10   4. Slow transit constipation 564.01 K59.01   5. Gastroesophageal reflux disease without esophagitis 530.81 K21.9   6. Right thyroid nodule 241.0 E04.1    Patient is being discharged with home health services:  PT/OT  Patient is being discharged with the following durable medical equipment:  Rolling walker, tub bench, 3-in-1 commode  Patient has been advised to f/u with their PCP in 1-2 weeks to bring them up to date on their rehab stay.  They were provided with a 30 day supply of scripts for prescription medications and refills must be obtained from their PCP.  TIME SPENT (MINUTES): [place]. Perlie Gold  [facility] [address] [place], [place] [address] [phone] Cell (Monday-Friday 8 AM - 5 PM) [phone] After 5 PM and follow prompts

## 2015-03-01 ENCOUNTER — Non-Acute Institutional Stay (SKILLED_NURSING_FACILITY): Payer: Medicare Other | Admitting: Nurse Practitioner

## 2015-03-01 DIAGNOSIS — K219 Gastro-esophageal reflux disease without esophagitis: Secondary | ICD-10-CM

## 2015-03-01 DIAGNOSIS — M1990 Unspecified osteoarthritis, unspecified site: Secondary | ICD-10-CM | POA: Diagnosis not present

## 2015-03-01 DIAGNOSIS — K5901 Slow transit constipation: Secondary | ICD-10-CM

## 2015-03-01 DIAGNOSIS — J449 Chronic obstructive pulmonary disease, unspecified: Secondary | ICD-10-CM | POA: Insufficient documentation

## 2015-03-01 DIAGNOSIS — J42 Unspecified chronic bronchitis: Secondary | ICD-10-CM | POA: Diagnosis not present

## 2015-03-01 DIAGNOSIS — I10 Essential (primary) hypertension: Secondary | ICD-10-CM

## 2015-03-01 NOTE — Progress Notes (Signed)
Patient ID: Leslie Farley, female   DOB: 1930/08/12, 82 y.o.   MRN: [id number]    Nursing Home Location:  [facility]   Place of Service: SNF (31)  PCP: No primary care provider on file.  No Known Allergies  Chief Complaint  Patient presents with  . Medical Management of Chronic Issues    HPI:  Patient is a 82 y.o. female seen today at [facility] for routine follow up on chronic conditions. Pt with hx of COPD, HTN, GERD, PE, constipation. Pt currently long term resident after ankle fx. Pt denies pain. Constipation has improved on current medication. Pt has been doing well in the last month.   Review of Systems:  Review of Systems  Constitutional: Negative for activity change, appetite change, fatigue and unexpected weight change.  HENT: Negative for congestion and hearing loss.   Eyes: Negative.   Respiratory: Negative for cough and shortness of breath.   Cardiovascular: Negative for chest pain, palpitations and leg swelling.  Gastrointestinal: Negative for abdominal pain, diarrhea and constipation.       GERD- improved on medication  Genitourinary: Negative for dysuria and difficulty urinating.  Musculoskeletal: Positive for arthralgias (right knee) and gait problem (uses WC). Negative for myalgias.  Skin: Negative for color change and wound.  Neurological: Negative for dizziness and weakness.  Psychiatric/Behavioral: Negative for behavioral problems, confusion and agitation.    Past Medical History  Diagnosis Date  . COPD (chronic obstructive pulmonary disease) (HCC)   . Asthma   . Hypertension   . GERD (gastroesophageal reflux disease)   . Arthritis    Past Surgical History  Procedure Laterality Date  . Cholecystectomy     Social History:   reports that she quit smoking about 44 years ago. She has never used smokeless tobacco. She reports that she does not drink alcohol or use illicit drugs.  Family History  Problem Relation Age  of Onset  . Cancer - Other Mother     Died of throat cancer  . Cancer - Prostate Father   . Diabetes Brother     Medications: Patient's Medications  New Prescriptions   No medications on file  Previous Medications   ACETAMINOPHEN (TYLENOL) 500 MG TABLET    Take 500-1,000 mg by mouth every 6 (six) hours as needed for mild pain or moderate pain.   ALBUTEROL (PROVENTIL HFA;VENTOLIN HFA) 108 (90 BASE) MCG/ACT INHALER    Inhale 1-2 puffs into the lungs every 6 (six) hours as needed for wheezing or shortness of breath.   ALBUTEROL (PROVENTIL) (2.5 MG/3ML) 0.083% NEBULIZER SOLUTION    Take 2.5 mg by nebulization every 6 (six) hours as needed for wheezing or shortness of breath.   APIXABAN (ELIQUIS) 5 MG TABS TABLET    Take 1 tablet (5 mg total) by mouth 2 (two) times daily.   ESOMEPRAZOLE (NEXIUM) 40 MG CAPSULE    Take 40 mg by mouth daily at 12 noon.   FLUTICASONE-SALMETEROL (ADVAIR) 500-50 MCG/DOSE AEPB    Inhale 1 puff into the lungs 2 (two) times daily.   HYDROCHLOROTHIAZIDE (MICROZIDE) 12.5 MG CAPSULE    Take 12.5 mg by mouth daily.   MONTELUKAST (SINGULAIR) 10 MG TABLET    Take 10 mg by mouth at bedtime.   SENNOSIDES-DOCUSATE SODIUM (SENOKOT-S) 8.6-50 MG TABLET    Take 1 tablet by mouth daily.   VALSARTAN (DIOVAN) 80 MG TABLET    Take 80 mg by mouth daily.  Modified Medications   No  medications on file  Discontinued Medications   No medications on file     Physical Exam: Filed Vitals:   03/01/15 1533  BP: 108/62  Pulse: 72  Temp: 97.3 F (36.3 C)  Resp: 18    Physical Exam  Constitutional: She is oriented to person, place, and time. She appears well-developed and well-nourished. No distress.  HENT:  Head: Normocephalic and atraumatic.  Mouth/Throat: Oropharynx is clear and moist. No oropharyngeal exudate.  Eyes: Conjunctivae are normal. Pupils are equal, round, and reactive to light.  Neck: Normal range of motion. Neck supple.  Cardiovascular: Normal rate, regular rhythm  and normal heart sounds.   Pulmonary/Chest: Effort normal and breath sounds normal.  Abdominal: Soft. Bowel sounds are normal.  Musculoskeletal: She exhibits tenderness (to right knee). She exhibits no edema.  Neurological: She is alert and oriented to person, place, and time.  Skin: Skin is warm and dry. She is not diaphoretic.  Psychiatric: She has a normal mood and affect.    Labs reviewed: Basic Metabolic Panel:  Recent Labs  28/41/3206/10/09 0039 10/30/14 1809 10/31/14 0515 11/06/14  NA 138 139 136 136*  K 3.7 3.6 3.7 3.8  CL 107 103 103  --   CO2 27 28 23   --   GLUCOSE 130* 116* 145*  --   BUN 21* 13 16 16   CREATININE 1.01* 0.84 0.79 0.9  CALCIUM 9.3 10.3 9.7  --    Liver Function Tests:  Recent Labs  10/01/14 0039 10/31/14 0515  AST 46* 22  ALT 34 22  ALKPHOS 57 69  BILITOT 0.4 0.7  PROT 6.9 6.8  ALBUMIN 3.9 3.6   No results for input(s): LIPASE, AMYLASE in the last 8760 hours. No results for input(s): AMMONIA in the last 8760 hours. CBC:  Recent Labs  10/01/14 0039 10/30/14 1809 10/31/14 0515 11/01/14 0520 11/06/14  WBC 7.7 7.7 5.6 11.7* 6.5  NEUTROABS 5.6 4.4  --   --   --   HGB 11.3* 13.3 11.5* 10.8* 11.8*  HCT 34.4* 39.6 33.9* 32.4* 37  MCV 91.2 92.3 90.9 91.5  --   PLT 230 211 226 250 222   TSH:  Recent Labs  11/02/14 1004  TSH 0.958   A1C: No results found for: HGBA1C Lipid Panel: No results for input(s): CHOL, HDL, LDLCALC, TRIG, CHOLHDL, LDLDIRECT in the last 8760 hours.  Radiological Exams: Dg Chest 2 View  10/30/2014  CLINICAL DATA:  Increasing shortness of breath for 1 week history of COPD EXAM: CHEST  2 VIEW COMPARISON:  10/01/2014 FINDINGS: Stable heart size, within normal limits. Stable mild bronchitic change. Vascular pattern normal. Lungs clear. No effusions. IMPRESSION: No active cardiopulmonary disease. Electronically Signed   By: Esperanza Heiraymond  Rubner M.D.   On: 10/30/2014 18:41   Dg Ankle Complete Right  10/31/2014  CLINICAL DATA:   82 year old female with right ankle fracture. EXAM: RIGHT ANKLE - COMPLETE 3+ VIEW COMPARISON:  Radiograph dated 10/01/2014 FINDINGS: Evaluation of the study is limited due to chest overlying the ankle. A minimally displaced oblique fracture of the distal fibula is again seen. The tibiotalar articulation is preserved. The soft tissues are unremarkable. IMPRESSION: Minimally displaced oblique fracture of the distal fibula. Electronically Signed   By: Elgie CollardArash  Radparvar M.D.   On: 10/31/2014 19:00   Ct Angio Chest Pe W/cm &/or Wo Cm  10/30/2014  CLINICAL DATA:  82 year old female with a reported history asthma, wheezing and shortness of breath. Chest pain. EXAM: CT ANGIOGRAPHY CHEST WITH CONTRAST TECHNIQUE:  Multidetector CT imaging of the chest was performed using the standard protocol during bolus administration of intravenous contrast. Multiplanar CT image reconstructions and MIPs were obtained to evaluate the vascular anatomy. CONTRAST:  OMNIPAQUE IOHEXOL 350 MG/ML SOLN COMPARISON:  Chest radiograph from earlier today. FINDINGS: Mediastinum/Nodes: Normal heart size. No dilatation of the right ventricle or right atrium. Normal position of the interventricular septum. No contrast reflux into the IVC. No pericardial fluid/thickening. Atherosclerotic nonaneurysmal thoracic aorta. Normal caliber main pulmonary artery. There are subsegmental acute pulmonary emboli in the right lower lobe (series 10/ image 162) and possibly in the left upper lobe (10/77). No central pulmonary emboli. Hypodense 1.6 cm right thyroid lobe nodule. Normal esophagus. No axillary, mediastinal or hilar lymphadenopathy. Lungs/Pleura: No pneumothorax. No pleural effusion. No significant pulmonary nodules, lung masses or acute consolidative airspace disease. Mild subpleural reticulation in the dependent lower lobes is likely due to hypoventilation. Upper abdomen: Tiny hiatal hernia. Musculoskeletal: Moth-eaten appearance of the bones, with no  focal aggressive osseous lesion. Review of the MIP images confirms the above findings. IMPRESSION: 1. Low burden acute subsegmental pulmonary embolism. No CT evidence of pulmonary arterial hypertension or right heart strain. 2. Moth-eaten appearance of the bones, a nonspecific finding that could be due to osteopenia or infiltrative osseous process. 3. Tiny hiatal hernia. Critical Value/emergent results were called by telephone at the time of interpretation on 10/30/2014 at 7:56 pm to PA Arthor Captain, who verbally acknowledged these results. Electronically Signed   By: Delbert Phenix M.D.   On: 10/30/2014 19:58    Assessment/Plan 1. Essential hypertension Well controlled on valsartan and hctz  2. Chronic bronchitis, unspecified chronic bronchitis type (HCC) COPD remains stable, no recent exacerbation.   3. Gastroesophageal reflux disease without esophagitis Stable, conts nexium  4. Arthritis Stable, uses tylenol as needed with good results  5. Constipation -stable, controlled on current regimen   6. Needs for eye exam Ophthalmology referral    Janene Harvey. Biagio Borg  [facility] [phone] 8 am - 5 pm) [phone] (after hours)

## 2015-03-12 DIAGNOSIS — S8264XD Nondisplaced fracture of lateral malleolus of right fibula, subsequent encounter for closed fracture with routine healing: Secondary | ICD-10-CM | POA: Diagnosis not present

## 2015-03-14 DIAGNOSIS — M6281 Muscle weakness (generalized): Secondary | ICD-10-CM | POA: Diagnosis not present

## 2015-03-14 DIAGNOSIS — J449 Chronic obstructive pulmonary disease, unspecified: Secondary | ICD-10-CM | POA: Diagnosis not present

## 2015-03-15 DIAGNOSIS — J449 Chronic obstructive pulmonary disease, unspecified: Secondary | ICD-10-CM | POA: Diagnosis not present

## 2015-03-15 DIAGNOSIS — M6281 Muscle weakness (generalized): Secondary | ICD-10-CM | POA: Diagnosis not present

## 2015-03-18 DIAGNOSIS — J449 Chronic obstructive pulmonary disease, unspecified: Secondary | ICD-10-CM | POA: Diagnosis not present

## 2015-03-18 DIAGNOSIS — M6281 Muscle weakness (generalized): Secondary | ICD-10-CM | POA: Diagnosis not present

## 2015-03-19 DIAGNOSIS — J449 Chronic obstructive pulmonary disease, unspecified: Secondary | ICD-10-CM | POA: Diagnosis not present

## 2015-03-19 DIAGNOSIS — M6281 Muscle weakness (generalized): Secondary | ICD-10-CM | POA: Diagnosis not present

## 2015-03-20 DIAGNOSIS — M6281 Muscle weakness (generalized): Secondary | ICD-10-CM | POA: Diagnosis not present

## 2015-03-20 DIAGNOSIS — J449 Chronic obstructive pulmonary disease, unspecified: Secondary | ICD-10-CM | POA: Diagnosis not present

## 2015-03-21 DIAGNOSIS — M6281 Muscle weakness (generalized): Secondary | ICD-10-CM | POA: Diagnosis not present

## 2015-03-21 DIAGNOSIS — J449 Chronic obstructive pulmonary disease, unspecified: Secondary | ICD-10-CM | POA: Diagnosis not present

## 2015-03-22 DIAGNOSIS — M6281 Muscle weakness (generalized): Secondary | ICD-10-CM | POA: Diagnosis not present

## 2015-03-22 DIAGNOSIS — J449 Chronic obstructive pulmonary disease, unspecified: Secondary | ICD-10-CM | POA: Diagnosis not present

## 2015-03-24 DIAGNOSIS — M6281 Muscle weakness (generalized): Secondary | ICD-10-CM | POA: Diagnosis not present

## 2015-03-24 DIAGNOSIS — J449 Chronic obstructive pulmonary disease, unspecified: Secondary | ICD-10-CM | POA: Diagnosis not present

## 2015-03-25 DIAGNOSIS — M6281 Muscle weakness (generalized): Secondary | ICD-10-CM | POA: Diagnosis not present

## 2015-03-25 DIAGNOSIS — J449 Chronic obstructive pulmonary disease, unspecified: Secondary | ICD-10-CM | POA: Diagnosis not present

## 2015-03-26 DIAGNOSIS — J449 Chronic obstructive pulmonary disease, unspecified: Secondary | ICD-10-CM | POA: Diagnosis not present

## 2015-03-26 DIAGNOSIS — M6281 Muscle weakness (generalized): Secondary | ICD-10-CM | POA: Diagnosis not present

## 2015-03-27 DIAGNOSIS — M6281 Muscle weakness (generalized): Secondary | ICD-10-CM | POA: Diagnosis not present

## 2015-03-27 DIAGNOSIS — J449 Chronic obstructive pulmonary disease, unspecified: Secondary | ICD-10-CM | POA: Diagnosis not present

## 2015-03-28 DIAGNOSIS — J449 Chronic obstructive pulmonary disease, unspecified: Secondary | ICD-10-CM | POA: Diagnosis not present

## 2015-03-28 DIAGNOSIS — M6281 Muscle weakness (generalized): Secondary | ICD-10-CM | POA: Diagnosis not present

## 2015-03-29 DIAGNOSIS — J449 Chronic obstructive pulmonary disease, unspecified: Secondary | ICD-10-CM | POA: Diagnosis not present

## 2015-03-29 DIAGNOSIS — M6281 Muscle weakness (generalized): Secondary | ICD-10-CM | POA: Diagnosis not present

## 2015-03-30 DIAGNOSIS — M6281 Muscle weakness (generalized): Secondary | ICD-10-CM | POA: Diagnosis not present

## 2015-03-30 DIAGNOSIS — J449 Chronic obstructive pulmonary disease, unspecified: Secondary | ICD-10-CM | POA: Diagnosis not present

## 2015-04-01 DIAGNOSIS — M6281 Muscle weakness (generalized): Secondary | ICD-10-CM | POA: Diagnosis not present

## 2015-04-01 DIAGNOSIS — J449 Chronic obstructive pulmonary disease, unspecified: Secondary | ICD-10-CM | POA: Diagnosis not present

## 2015-04-02 DIAGNOSIS — M6281 Muscle weakness (generalized): Secondary | ICD-10-CM | POA: Diagnosis not present

## 2015-04-02 DIAGNOSIS — J449 Chronic obstructive pulmonary disease, unspecified: Secondary | ICD-10-CM | POA: Diagnosis not present

## 2015-04-03 DIAGNOSIS — J449 Chronic obstructive pulmonary disease, unspecified: Secondary | ICD-10-CM | POA: Diagnosis not present

## 2015-04-03 DIAGNOSIS — M6281 Muscle weakness (generalized): Secondary | ICD-10-CM | POA: Diagnosis not present

## 2015-04-04 DIAGNOSIS — J449 Chronic obstructive pulmonary disease, unspecified: Secondary | ICD-10-CM | POA: Diagnosis not present

## 2015-04-04 DIAGNOSIS — M6281 Muscle weakness (generalized): Secondary | ICD-10-CM | POA: Diagnosis not present

## 2015-04-05 DIAGNOSIS — J449 Chronic obstructive pulmonary disease, unspecified: Secondary | ICD-10-CM | POA: Diagnosis not present

## 2015-04-05 DIAGNOSIS — M6281 Muscle weakness (generalized): Secondary | ICD-10-CM | POA: Diagnosis not present

## 2015-04-07 DIAGNOSIS — J449 Chronic obstructive pulmonary disease, unspecified: Secondary | ICD-10-CM | POA: Diagnosis not present

## 2015-04-07 DIAGNOSIS — M6281 Muscle weakness (generalized): Secondary | ICD-10-CM | POA: Diagnosis not present

## 2015-04-08 ENCOUNTER — Non-Acute Institutional Stay (SKILLED_NURSING_FACILITY): Payer: Medicare Other | Admitting: Nurse Practitioner

## 2015-04-08 ENCOUNTER — Encounter: Payer: Self-pay | Admitting: Nurse Practitioner

## 2015-04-08 DIAGNOSIS — I10 Essential (primary) hypertension: Secondary | ICD-10-CM | POA: Diagnosis not present

## 2015-04-08 DIAGNOSIS — K219 Gastro-esophageal reflux disease without esophagitis: Secondary | ICD-10-CM | POA: Diagnosis not present

## 2015-04-08 DIAGNOSIS — K5901 Slow transit constipation: Secondary | ICD-10-CM

## 2015-04-08 DIAGNOSIS — M1990 Unspecified osteoarthritis, unspecified site: Secondary | ICD-10-CM | POA: Diagnosis not present

## 2015-04-08 DIAGNOSIS — J42 Unspecified chronic bronchitis: Secondary | ICD-10-CM | POA: Diagnosis not present

## 2015-04-08 DIAGNOSIS — J449 Chronic obstructive pulmonary disease, unspecified: Secondary | ICD-10-CM | POA: Diagnosis not present

## 2015-04-08 DIAGNOSIS — I2699 Other pulmonary embolism without acute cor pulmonale: Secondary | ICD-10-CM

## 2015-04-08 DIAGNOSIS — M6281 Muscle weakness (generalized): Secondary | ICD-10-CM | POA: Diagnosis not present

## 2015-04-08 NOTE — Progress Notes (Signed)
Patient ID: Leslie Farley, female   DOB: 1930-07-27, 82 y.o.   MRN: [id number]    Nursing Home Location:  [facility]   Place of Service: SNF (31)  PCP: No primary care provider on file.  No Known Allergies  Chief Complaint  Patient presents with  . Medical Management of Chronic Issues    Routine Visit    HPI:  Patient is a 82 y.o. female seen today at [facility] for routine follow up on chronic conditions. Pt with hx of COPD, HTN, GERD, PE, constipation. Pt currently long term resident after ankle fx. Pt has been doing well in the last month. Without acute issues. Notes occasional shortness of breath and needing PRN albuterol.  Bowels moving well on current regimen. Pain controlled on tylenol. Good appetite. No current concerns from nursing.   Review of Systems:  Review of Systems  Constitutional: Negative for activity change, appetite change, fatigue and unexpected weight change.  HENT: Negative for congestion and hearing loss.   Eyes: Negative.   Respiratory: Negative for cough and shortness of breath.   Cardiovascular: Negative for chest pain, palpitations and leg swelling.  Gastrointestinal: Negative for abdominal pain, diarrhea and constipation.       GERD- improved on medication  Genitourinary: Negative for dysuria and difficulty urinating.  Musculoskeletal: Positive for arthralgias (to knees and shoulders) and gait problem (uses WC). Negative for myalgias.  Skin: Negative for color change and wound.  Neurological: Negative for dizziness and weakness.  Psychiatric/Behavioral: Negative for behavioral problems, confusion and agitation.    Past Medical History  Diagnosis Date  . COPD (chronic obstructive pulmonary disease) (HCC)   . Asthma   . Hypertension   . GERD (gastroesophageal reflux disease)   . Arthritis    Past Surgical History  Procedure Laterality Date  . Cholecystectomy     Social History:   reports that she quit  smoking about 44 years ago. She has never used smokeless tobacco. She reports that she does not drink alcohol or use illicit drugs.  Family History  Problem Relation Age of Onset  . Cancer - Other Mother     Died of throat cancer  . Cancer - Prostate Father   . Diabetes Brother     Medications: Patient's Medications  New Prescriptions   No medications on file  Previous Medications   ACETAMINOPHEN (TYLENOL) 500 MG TABLET    Take 500-1,000 mg by mouth every 6 (six) hours as needed for mild pain or moderate pain.   ALBUTEROL (PROVENTIL HFA;VENTOLIN HFA) 108 (90 BASE) MCG/ACT INHALER    Inhale 1-2 puffs into the lungs every 6 (six) hours as needed for wheezing or shortness of breath.   ALBUTEROL (PROVENTIL) (2.5 MG/3ML) 0.083% NEBULIZER SOLUTION    Take 2.5 mg by nebulization every 6 (six) hours as needed for wheezing or shortness of breath.   APIXABAN (ELIQUIS) 5 MG TABS TABLET    Take 1 tablet (5 mg total) by mouth 2 (two) times daily.   ESOMEPRAZOLE (NEXIUM) 40 MG CAPSULE    Take 40 mg by mouth daily at 12 noon.   FLUTICASONE-SALMETEROL (ADVAIR) 500-50 MCG/DOSE AEPB    Inhale 1 puff into the lungs 2 (two) times daily.   HYDROCHLOROTHIAZIDE (MICROZIDE) 12.5 MG CAPSULE    Take 12.5 mg by mouth daily.   MECLIZINE (ANTIVERT) 12.5 MG TABLET    Take 12.5 mg by mouth 3 (three) times daily as needed for dizziness.   MONTELUKAST (SINGULAIR) 10  MG TABLET    Take 10 mg by mouth at bedtime.   SENNOSIDES-DOCUSATE SODIUM (SENOKOT-S) 8.6-50 MG TABLET    Take 1 tablet by mouth daily.   VALSARTAN (DIOVAN) 80 MG TABLET    Take 80 mg by mouth daily.  Modified Medications   No medications on file  Discontinued Medications   No medications on file     Physical Exam: Filed Vitals:   04/08/15 1550  BP: 126/72  Pulse: 72  Temp: 98 F (36.7 C)  TempSrc: Oral  Resp: 18  Height: 5\' 5"  (1.651 m)  Weight: 200 lb (90.719 kg)    Physical Exam  Constitutional: She is oriented to person, place, and  time. She appears well-developed and well-nourished. No distress.  HENT:  Head: Normocephalic and atraumatic.  Mouth/Throat: Oropharynx is clear and moist. No oropharyngeal exudate.  Eyes: Conjunctivae are normal. Pupils are equal, round, and reactive to light.  Neck: Normal range of motion. Neck supple.  Cardiovascular: Normal rate, regular rhythm and normal heart sounds.   Pulmonary/Chest: Effort normal and breath sounds normal.  Abdominal: Soft. Bowel sounds are normal.  Musculoskeletal: She exhibits no edema.  Neurological: She is alert and oriented to person, place, and time.  Skin: Skin is warm and dry. She is not diaphoretic.  Psychiatric: She has a normal mood and affect.    Labs reviewed: Basic Metabolic Panel:  Recent Labs  40/98/11 0039 10/30/14 1809 10/31/14 0515 11/06/14  NA 138 139 136 136*  K 3.7 3.6 3.7 3.8  CL 107 103 103  --   CO2 27 28 23   --   GLUCOSE 130* 116* 145*  --   BUN 21* 13 16 16   CREATININE 1.01* 0.84 0.79 0.9  CALCIUM 9.3 10.3 9.7  --    Liver Function Tests:  Recent Labs  10/01/14 0039 10/31/14 0515  AST 46* 22  ALT 34 22  ALKPHOS 57 69  BILITOT 0.4 0.7  PROT 6.9 6.8  ALBUMIN 3.9 3.6   No results for input(s): LIPASE, AMYLASE in the last 8760 hours. No results for input(s): AMMONIA in the last 8760 hours. CBC:  Recent Labs  10/01/14 0039 10/30/14 1809 10/31/14 0515 11/01/14 0520 11/06/14  WBC 7.7 7.7 5.6 11.7* 6.5  NEUTROABS 5.6 4.4  --   --   --   HGB 11.3* 13.3 11.5* 10.8* 11.8*  HCT 34.4* 39.6 33.9* 32.4* 37  MCV 91.2 92.3 90.9 91.5  --   PLT 230 211 226 250 222   TSH:  Recent Labs  11/02/14 1004  TSH 0.958   A1C: No results found for: HGBA1C Lipid Panel: No results for input(s): CHOL, HDL, LDLCALC, TRIG, CHOLHDL, LDLDIRECT in the last 8760 hours.  Radiological Exams: Dg Chest 2 View  10/30/2014  CLINICAL DATA:  Increasing shortness of breath for 1 week history of COPD EXAM: CHEST  2 VIEW COMPARISON:   10/01/2014 FINDINGS: Stable heart size, within normal limits. Stable mild bronchitic change. Vascular pattern normal. Lungs clear. No effusions. IMPRESSION: No active cardiopulmonary disease. Electronically Signed   By: Esperanza Heir M.D.   On: 10/30/2014 18:41   Dg Ankle Complete Right  10/31/2014  CLINICAL DATA:  82 year old female with right ankle fracture. EXAM: RIGHT ANKLE - COMPLETE 3+ VIEW COMPARISON:  Radiograph dated 10/01/2014 FINDINGS: Evaluation of the study is limited due to chest overlying the ankle. A minimally displaced oblique fracture of the distal fibula is again seen. The tibiotalar articulation is preserved. The soft tissues are unremarkable.  IMPRESSION: Minimally displaced oblique fracture of the distal fibula. Electronically Signed   By: Elgie Collard M.D.   On: 10/31/2014 19:00   Ct Angio Chest Pe W/cm &/or Wo Cm  10/30/2014  CLINICAL DATA:  82 year old female with a reported history asthma, wheezing and shortness of breath. Chest pain. EXAM: CT ANGIOGRAPHY CHEST WITH CONTRAST TECHNIQUE: Multidetector CT imaging of the chest was performed using the standard protocol during bolus administration of intravenous contrast. Multiplanar CT image reconstructions and MIPs were obtained to evaluate the vascular anatomy. CONTRAST:  OMNIPAQUE IOHEXOL 350 MG/ML SOLN COMPARISON:  Chest radiograph from earlier today. FINDINGS: Mediastinum/Nodes: Normal heart size. No dilatation of the right ventricle or right atrium. Normal position of the interventricular septum. No contrast reflux into the IVC. No pericardial fluid/thickening. Atherosclerotic nonaneurysmal thoracic aorta. Normal caliber main pulmonary artery. There are subsegmental acute pulmonary emboli in the right lower lobe (series 10/ image 162) and possibly in the left upper lobe (10/77). No central pulmonary emboli. Hypodense 1.6 cm right thyroid lobe nodule. Normal esophagus. No axillary, mediastinal or hilar lymphadenopathy.  Lungs/Pleura: No pneumothorax. No pleural effusion. No significant pulmonary nodules, lung masses or acute consolidative airspace disease. Mild subpleural reticulation in the dependent lower lobes is likely due to hypoventilation. Upper abdomen: Tiny hiatal hernia. Musculoskeletal: Moth-eaten appearance of the bones, with no focal aggressive osseous lesion. Review of the MIP images confirms the above findings. IMPRESSION: 1. Low burden acute subsegmental pulmonary embolism. No CT evidence of pulmonary arterial hypertension or right heart strain. 2. Moth-eaten appearance of the bones, a nonspecific finding that could be due to osteopenia or infiltrative osseous process. 3. Tiny hiatal hernia. Critical Value/emergent results were called by telephone at the time of interpretation on 10/30/2014 at 7:56 pm to PA Arthor Captain, who verbally acknowledged these results. Electronically Signed   By: Delbert Phenix M.D.   On: 10/30/2014 19:58    Assessment/Plan 1. Essential hypertension Blood pressure stable on HCTZ and valsartan, will follow up BMP  2. Gastroesophageal reflux disease without esophagitis Occasional GERD but well controlled on nexium.   3. Arthritis Generalized, taking tylenol with good effects   4. Chronic bronchitis, unspecified chronic bronchitis type (HCC) COPD remains stable on adviar, singulair. Pt needing occasional PRN albuterol   5. Slow transit constipation -well controlled on current regimen, conts on senokot s   6. PE (pulmonary embolism) Acute PE from hospitalization in September, to be on eliquis until 04/30/15    Josealberto Montalto K. Biagio Borg  [facility] [phone] 8 am - 5 pm) [phone] (after hours)

## 2015-04-09 DIAGNOSIS — Z79899 Other long term (current) drug therapy: Secondary | ICD-10-CM | POA: Diagnosis not present

## 2015-04-09 DIAGNOSIS — J449 Chronic obstructive pulmonary disease, unspecified: Secondary | ICD-10-CM | POA: Diagnosis not present

## 2015-04-09 DIAGNOSIS — M6281 Muscle weakness (generalized): Secondary | ICD-10-CM | POA: Diagnosis not present

## 2015-04-09 LAB — BASIC METABOLIC PANEL
BUN: 18 mg/dL (ref 4–21)
Creatinine: 0.9 mg/dL (ref 0.5–1.1)
GLUCOSE: 95 mg/dL
POTASSIUM: 4 mmol/L (ref 3.4–5.3)
SODIUM: 138 mmol/L (ref 137–147)

## 2015-04-09 LAB — CBC AND DIFFERENTIAL
HEMATOCRIT: 33 % — AB (ref 36–46)
HEMOGLOBIN: 11 g/dL — AB (ref 12.0–16.0)
Platelets: 213 10*3/uL (ref 150–399)
WBC: 6.1 10^3/mL

## 2015-04-09 LAB — HEPATIC FUNCTION PANEL
ALK PHOS: 50 U/L (ref 25–125)
ALT: 9 U/L (ref 7–35)
AST: 13 U/L (ref 13–35)
Bilirubin, Total: 0.4 mg/dL

## 2015-04-10 DIAGNOSIS — M6281 Muscle weakness (generalized): Secondary | ICD-10-CM | POA: Diagnosis not present

## 2015-04-10 DIAGNOSIS — J449 Chronic obstructive pulmonary disease, unspecified: Secondary | ICD-10-CM | POA: Diagnosis not present

## 2015-04-11 DIAGNOSIS — J449 Chronic obstructive pulmonary disease, unspecified: Secondary | ICD-10-CM | POA: Diagnosis not present

## 2015-04-11 DIAGNOSIS — M6281 Muscle weakness (generalized): Secondary | ICD-10-CM | POA: Diagnosis not present

## 2015-04-12 DIAGNOSIS — J449 Chronic obstructive pulmonary disease, unspecified: Secondary | ICD-10-CM | POA: Diagnosis not present

## 2015-04-12 DIAGNOSIS — M6281 Muscle weakness (generalized): Secondary | ICD-10-CM | POA: Diagnosis not present

## 2015-04-15 DIAGNOSIS — H04123 Dry eye syndrome of bilateral lacrimal glands: Secondary | ICD-10-CM | POA: Diagnosis not present

## 2015-04-15 DIAGNOSIS — Z7951 Long term (current) use of inhaled steroids: Secondary | ICD-10-CM | POA: Diagnosis not present

## 2015-04-15 DIAGNOSIS — H25813 Combined forms of age-related cataract, bilateral: Secondary | ICD-10-CM | POA: Diagnosis not present

## 2015-04-15 DIAGNOSIS — J449 Chronic obstructive pulmonary disease, unspecified: Secondary | ICD-10-CM | POA: Diagnosis not present

## 2015-04-15 DIAGNOSIS — M6281 Muscle weakness (generalized): Secondary | ICD-10-CM | POA: Diagnosis not present

## 2015-04-16 DIAGNOSIS — M6281 Muscle weakness (generalized): Secondary | ICD-10-CM | POA: Diagnosis not present

## 2015-04-16 DIAGNOSIS — J449 Chronic obstructive pulmonary disease, unspecified: Secondary | ICD-10-CM | POA: Diagnosis not present

## 2015-04-17 DIAGNOSIS — M6281 Muscle weakness (generalized): Secondary | ICD-10-CM | POA: Diagnosis not present

## 2015-04-17 DIAGNOSIS — J449 Chronic obstructive pulmonary disease, unspecified: Secondary | ICD-10-CM | POA: Diagnosis not present

## 2015-04-18 DIAGNOSIS — J449 Chronic obstructive pulmonary disease, unspecified: Secondary | ICD-10-CM | POA: Diagnosis not present

## 2015-04-18 DIAGNOSIS — M6281 Muscle weakness (generalized): Secondary | ICD-10-CM | POA: Diagnosis not present

## 2015-04-19 DIAGNOSIS — M6281 Muscle weakness (generalized): Secondary | ICD-10-CM | POA: Diagnosis not present

## 2015-04-19 DIAGNOSIS — J449 Chronic obstructive pulmonary disease, unspecified: Secondary | ICD-10-CM | POA: Diagnosis not present

## 2015-05-01 DIAGNOSIS — I10 Essential (primary) hypertension: Secondary | ICD-10-CM | POA: Diagnosis not present

## 2015-05-02 LAB — CBC AND DIFFERENTIAL
HEMATOCRIT: 30 % — AB (ref 36–46)
Hemoglobin: 10.4 g/dL — AB (ref 12.0–16.0)
PLATELETS: 213 10*3/uL (ref 150–399)
WBC: 6.5 10^3/mL

## 2015-05-02 LAB — BASIC METABOLIC PANEL
BUN: 17 mg/dL (ref 4–21)
CREATININE: 0.9 mg/dL (ref 0.5–1.1)
GLUCOSE: 97 mg/dL
Potassium: 3.8 mmol/L (ref 3.4–5.3)
Sodium: 139 mmol/L (ref 137–147)

## 2015-05-10 ENCOUNTER — Encounter: Payer: Self-pay | Admitting: Adult Health

## 2015-05-10 ENCOUNTER — Non-Acute Institutional Stay (SKILLED_NURSING_FACILITY): Payer: Medicare Other | Admitting: Adult Health

## 2015-05-10 DIAGNOSIS — K219 Gastro-esophageal reflux disease without esophagitis: Secondary | ICD-10-CM | POA: Diagnosis not present

## 2015-05-10 DIAGNOSIS — J449 Chronic obstructive pulmonary disease, unspecified: Secondary | ICD-10-CM | POA: Diagnosis not present

## 2015-05-10 DIAGNOSIS — I2699 Other pulmonary embolism without acute cor pulmonale: Secondary | ICD-10-CM | POA: Diagnosis not present

## 2015-05-10 DIAGNOSIS — I10 Essential (primary) hypertension: Secondary | ICD-10-CM | POA: Diagnosis not present

## 2015-05-10 DIAGNOSIS — K5900 Constipation, unspecified: Secondary | ICD-10-CM

## 2015-05-10 DIAGNOSIS — K5909 Other constipation: Secondary | ICD-10-CM | POA: Insufficient documentation

## 2015-05-10 NOTE — Progress Notes (Signed)
Patient ID: Leslie Farley, female   DOB: 1930/12/29, 82 y.o.   MRN: [id number]   Facility[facility]        No Known Allergies  Chief Complaint  Patient presents with  . Medical Management of Chronic Issues    Follow up    HPI:  She is a long term resident of this facility being seen for the management of her chronic illnesses. Overall her status is stable. She tells me that she has right shoulder pain; but has adequate pain relief. There are no nursing concerns at this time.    Past Medical History  Diagnosis Date  . COPD (chronic obstructive pulmonary disease) (HCC)   . Asthma   . Hypertension   . GERD (gastroesophageal reflux disease)   . Arthritis     Past Surgical History  Procedure Laterality Date  . Cholecystectomy      VITAL SIGNS BP 126/76 mmHg  Pulse 72  Temp(Src) 98 F (36.7 C) (Oral)  Resp 18  Ht 5\' 5"  (1.651 m)  Wt 201 lb 4 oz (91.286 kg)  BMI 33.49 kg/m2  Patient's Medications  New Prescriptions   No medications on file  Previous Medications   ACETAMINOPHEN (TYLENOL) 500 MG TABLET    Take 500-1,000 mg by mouth every 6 (six) hours as needed for mild pain or moderate pain.   ALBUTEROL (PROVENTIL HFA;VENTOLIN HFA) 108 (90 BASE) MCG/ACT INHALER    Inhale 1-2 puffs into the lungs every 6 (six) hours as needed for wheezing or shortness of breath.   ALBUTEROL (PROVENTIL) (2.5 MG/3ML) 0.083% NEBULIZER SOLUTION    Take 2.5 mg by nebulization every 6 (six) hours as needed for wheezing or shortness of breath.   APIXABAN (ELIQUIS) 5 MG TABS TABLET    Take 1 tablet (5 mg total) by mouth 2 (two) times daily.   ESOMEPRAZOLE (NEXIUM) 40 MG CAPSULE    Take 40 mg by mouth daily at 12 noon.   FLUTICASONE-SALMETEROL (ADVAIR) 500-50 MCG/DOSE AEPB    Inhale 1 puff into the lungs 2 (two) times daily.   HYDROCHLOROTHIAZIDE (MICROZIDE) 12.5 MG CAPSULE    Take 12.5 mg by mouth daily.   MECLIZINE (ANTIVERT) 12.5 MG TABLET    Take 12.5 mg by mouth 3  (three) times daily as needed for dizziness.   MONTELUKAST (SINGULAIR) 10 MG TABLET    Take 10 mg by mouth at bedtime.   SENNOSIDES-DOCUSATE SODIUM (SENOKOT-S) 8.6-50 MG TABLET    Take 1 tablet by mouth daily.   VALSARTAN (DIOVAN) 80 MG TABLET    Take 80 mg by mouth daily.  Modified Medications   No medications on file  Discontinued Medications   No medications on file     SIGNIFICANT DIAGNOSTIC EXAMS    LABS REVIEWED:   05-01-15: wbc 6.5; hgb 10.4; hct 30.5. mcv 92.1; plt 213; glucose 97; bun 17; creat 0.89; k+ 3.8; na++139;    Review of Systems  Constitutional: Negative for malaise/fatigue.  Respiratory: Negative for cough and shortness of breath.   Cardiovascular: Negative for chest pain, palpitations and leg swelling.  Gastrointestinal: Negative for heartburn, abdominal pain and constipation.  Musculoskeletal: Negative for myalgias, back pain and joint pain.       Right shoulder pain managed   Skin: Negative.   Neurological: Negative for dizziness.  Psychiatric/Behavioral: The patient is not nervous/anxious.     Physical Exam  Constitutional: She appears well-developed and well-nourished. No distress.  Obese   Eyes: Conjunctivae are normal.  Neck:  Neck supple. No JVD present. No thyromegaly present.  Cardiovascular: Normal rate, regular rhythm and intact distal pulses.   Respiratory: Effort normal and breath sounds normal. No respiratory distress. She has no wheezes.  GI: Soft. Bowel sounds are normal. She exhibits no distension. There is no tenderness.  Musculoskeletal: She exhibits no edema.  Is able to move all extremities Has right side weakness   Lymphadenopathy:    She has no cervical adenopathy.  Neurological: She is alert.  Skin: Skin is warm and dry. She is not diaphoretic.  Psychiatric: She has a normal mood and affect.       ASSESSMENT/ PLAN:  1. COPD is stable will continue albuterol inhaler/neb every 6 hours as needed; will continue singulair 10  mg nightly; and advair 500/50 twice daily   2. Gerd: will continue nexium 40 mg daily   3. Hypertension: will continue hctz 12.5 mg daily and diovan 80 mg daily   4. Constipation: will continue senna s daily   5. PE: is stable is on eliquis 5 mg twice daily will monitor    Time spent with patient  45  minutes >50% time spent counseling; reviewing medical record; tests; labs; and developing future plan of care     Synthia Innocent NP [facility]  Contact [phone] Monday through Friday 8am- 5pm  After hours call [phone]

## 2015-05-29 ENCOUNTER — Non-Acute Institutional Stay (SKILLED_NURSING_FACILITY): Payer: Medicare Other | Admitting: Adult Health

## 2015-05-29 ENCOUNTER — Encounter: Payer: Self-pay | Admitting: Adult Health

## 2015-05-29 DIAGNOSIS — M25511 Pain in right shoulder: Secondary | ICD-10-CM | POA: Diagnosis not present

## 2015-05-29 NOTE — Progress Notes (Signed)
Patient ID: Leslie Farley, female   DOB: [birth year], 82 y.o.   MRN: [id number]  Location:  [facility] Room Number: 122 A Place of Service:  SNF (31) Provider:   Peggye Ley, ANP [facility] [phone]   Kirt Boys, DO  Patient Care Team: Kirt Boys, DO as PCP - General (Internal Medicine)  Extended Emergency Contact Information Primary Emergency Contact: Guillaume,Nathalie Address: [address] [place], [place] [address] [place] of [place] Home Phone: [phone] Mobile Phone: [phone] Relation: Daughter Secondary Emergency Contact: Maris Berger [place] of [place] Home Phone: [phone] Mobile Phone: [phone] Relation: Daughter  Code Status:  Full code Goals of care: Advanced Directive information Advanced Directives 05/29/2015  Does patient have an advance directive? No  Would patient like information on creating an advanced directive? No - patient declined information     Chief Complaint  Patient presents with  . Acute Visit    Right Shoulder Pain    HPI:  Pt is a 82 y.o. female seen today for an acute visit for right shoulder pain.  Resident reports that it has been present for a long time (unable to specify) and radiates from the neck down the right arm.  She feel last year (2016) and a CT of her head and neck were performed showing the following:  There is moderately severe disc space narrowing at C5-6 and C6-7. There is slightly less narrowing at C7-T1. There is facet hypertrophy to varying degrees at most levels bilaterally. There is fairly significant exit foraminal narrowing on the right at C4-5, on the left at C5-6, and on the right at C6-7 due to bony hypertrophy. There is no disc extrusion or high-grade stenosis appreciable.  She reports that the shoulder hurts at night and keeps her up at times, she also has some right knee pain at night. She reports having  osteoarthritis. She is [place] but speaks some english.  She also has memory issues so the details of her story are limited.   Past Medical History  Diagnosis Date  . COPD (chronic obstructive pulmonary disease) (HCC)   . Asthma   . Hypertension   . GERD (gastroesophageal reflux disease)   . Arthritis    Past Surgical History  Procedure Laterality Date  . Cholecystectomy      No Known Allergies    Medication List       This list is accurate as of: 05/29/15 12:12 PM.  Always use your most recent med list.               acetaminophen 500 MG tablet  Commonly known as:  TYLENOL  Take 500-1,000 mg by mouth every 6 (six) hours as needed for mild pain or moderate pain.     albuterol 108 (90 Base) MCG/ACT inhaler  Commonly known as:  PROVENTIL HFA;VENTOLIN HFA  Inhale 1-2 puffs into the lungs every 6 (six) hours as needed for wheezing or shortness of breath.     albuterol (2.5 MG/3ML) 0.083% nebulizer solution  Commonly known as:  PROVENTIL  Take 2.5 mg by nebulization every 6 (six) hours as needed for wheezing or shortness of breath.     Carboxymethylcellulose Sodium 0.25 % Soln  Place 1 drop into each eye every morning and at daily at bedtime.     esomeprazole 40 MG capsule  Commonly known as:  NEXIUM  Take 40 mg by mouth daily at 12  noon.     Fluticasone-Salmeterol 500-50 MCG/DOSE Aepb  Commonly known as:  ADVAIR  Inhale 1 puff into the lungs 2 (two) times daily.     hydrochlorothiazide 12.5 MG capsule  Commonly known as:  MICROZIDE  Take 12.5 mg by mouth daily.     ipratropium-albuterol 0.5-2.5 (3) MG/3ML Soln  Commonly known as:  DUONEB  Inhale contents of one vial twice daily for COPD.     meclizine 12.5 MG tablet  Commonly known as:  ANTIVERT  Take 12.5 mg by mouth 3 (three) times daily as needed for dizziness.     montelukast 10 MG tablet  Commonly known as:  SINGULAIR  Take 10 mg by mouth at bedtime.     sennosides-docusate sodium 8.6-50 MG tablet    Commonly known as:  SENOKOT-S  Take 1 tablet by mouth daily.     valsartan 80 MG tablet  Commonly known as:  DIOVAN  Take 80 mg by mouth daily.        Review of Systems  Constitutional: Negative for fever, chills, diaphoresis, activity change, appetite change, fatigue and unexpected weight change.  Respiratory: Negative for cough and shortness of breath.   Cardiovascular: Negative for chest pain and leg swelling.  Musculoskeletal: Positive for arthralgias and neck pain. Negative for myalgias, back pain, joint swelling, gait problem and neck stiffness.       Denies numbness or tingling down the arm    Immunization History  Administered Date(s) Administered  . Influenza,inj,Quad PF,36+ Mos 10/31/2014  . PPD Test 11/02/2014   Pertinent  Health Maintenance Due  Topic Date Due  . PNA vac Low Risk Adult (1 of 2 - PCV13) 04/07/2016 (Originally 02/19/1998)  . DEXA SCAN  04/13/2023 (Originally 02/19/1998)  . INFLUENZA VACCINE  09/24/2015   No flowsheet data found. Functional Status Survey:    Filed Vitals:   05/29/15 1159  BP: 126/76  Pulse: 72  Temp: 98 F (36.7 C)  TempSrc: Oral  Resp: 18  Height:  (1.651 m)  Weight: 201 lb (91.173 kg)   Body mass index is 33.45 kg/(m^2). Physical Exam  Constitutional: She is oriented to person, place, and time. She appears well-developed and well-nourished. No distress.  Neck: Normal range of motion. Neck supple.  Cardiovascular: Normal rate and regular rhythm.   No murmur heard. Pulmonary/Chest: Effort normal and breath sounds normal. No respiratory distress.  Abdominal: Soft. Bowel sounds are normal. She exhibits no distension.  Musculoskeletal:  Right shoulder with decreased ROM, muscle weakness, negative empty can test.  +CMS.  Right knee without crepitus, edema, or tenderness. Left knee with crepitus, no edema or tenderness. Normal ROM to both knees.  Neurological: She is alert and oriented to person, place, and time.   Skin: Skin is warm and dry. She is not diaphoretic.  Psychiatric: She has a normal mood and affect.    Labs reviewed:  Recent Labs  10/01/14 0039 10/30/14 1809 10/31/14 0515 11/06/14 04/09/15 05/02/15  NA 138 139 136 136* 138 139  K 3.7 3.6 3.7 3.8 4.0 3.8  CL 107 103 103  --   --   --   CO2 --   --   --   GLUCOSE 130* 116* 145*  --   --   --   BUN 21* CREATININE 1.01* 0.84 0.79 0.9 0.9 0.9  CALCIUM 9.3 10.3 9.7  --   --   --  Recent Labs  10/01/14 0039 10/31/14 0515 04/09/15  AST 46* 22 13  ALT 34 22 9  ALKPHOS 57 69 50  BILITOT 0.4 0.7  --   PROT 6.9 6.8  --   ALBUMIN 3.9 3.6  --     Recent Labs  10/01/14 0039 10/30/14 1809 10/31/14 0515 11/01/14 0520 11/06/14 04/09/15 05/02/15  WBC 7.7 7.7 5.6 11.7* 6.5 6.1 6.5  NEUTROABS 5.6 4.4  --   --   --   --   --   HGB 11.3* 13.3 11.5* 10.8* 11.8* 11.0* 10.4*  HCT 34.4* 39.6 33.9* 32.4* 37 33* 30*  MCV 91.2 92.3 90.9 91.5  --   --   --   PLT 230 211 226 250 222 213 213   Lab Results  Component Value Date   TSH 0.958 11/02/2014   No results found for: HGBA1C No results found for: CHOL, HDL, LDLCALC, LDLDIRECT, TRIG, CHOLHDL  Significant Diagnostic Results in last 30 days:  No results found.  Assessment/Plan  1) Right shoulder pain  -no point tenderness noted at the rotator cuff area, negative empty can test -significant impairment in ROM and muscle strength -due to the description of pain that is radicular from the neck down the right, this may be related to the disc space narrowing noted on CT due to arthritis changes, however, her issues are unilateral and could still be due to previous shoulder injury. -I discussed sending her to a surgeon and she declined for now due to her age -We will try ultram scheduled at night and if no improvement will reconsider and discuss this with her.  She could benefit from PT as well but I would like her to be evaluated by ortho first. -Ultram  50 mg qhs and q 6hrs prn pain  Peggye Leyhristy Jasaun Carn, ANP [facility] [phone]

## 2015-06-04 DIAGNOSIS — M6281 Muscle weakness (generalized): Secondary | ICD-10-CM | POA: Diagnosis not present

## 2015-06-05 DIAGNOSIS — M6281 Muscle weakness (generalized): Secondary | ICD-10-CM | POA: Diagnosis not present

## 2015-06-06 DIAGNOSIS — M6281 Muscle weakness (generalized): Secondary | ICD-10-CM | POA: Diagnosis not present

## 2015-06-07 DIAGNOSIS — M6281 Muscle weakness (generalized): Secondary | ICD-10-CM | POA: Diagnosis not present

## 2015-06-08 DIAGNOSIS — M6281 Muscle weakness (generalized): Secondary | ICD-10-CM | POA: Diagnosis not present

## 2015-06-10 DIAGNOSIS — M6281 Muscle weakness (generalized): Secondary | ICD-10-CM | POA: Diagnosis not present

## 2015-06-11 DIAGNOSIS — M6281 Muscle weakness (generalized): Secondary | ICD-10-CM | POA: Diagnosis not present

## 2015-06-12 DIAGNOSIS — M6281 Muscle weakness (generalized): Secondary | ICD-10-CM | POA: Diagnosis not present

## 2015-06-13 DIAGNOSIS — M6281 Muscle weakness (generalized): Secondary | ICD-10-CM | POA: Diagnosis not present

## 2015-06-14 DIAGNOSIS — M6281 Muscle weakness (generalized): Secondary | ICD-10-CM | POA: Diagnosis not present

## 2015-06-14 DIAGNOSIS — B351 Tinea unguium: Secondary | ICD-10-CM | POA: Diagnosis not present

## 2015-06-14 DIAGNOSIS — M79672 Pain in left foot: Secondary | ICD-10-CM | POA: Diagnosis not present

## 2015-06-14 DIAGNOSIS — M79671 Pain in right foot: Secondary | ICD-10-CM | POA: Diagnosis not present

## 2015-06-15 DIAGNOSIS — M6281 Muscle weakness (generalized): Secondary | ICD-10-CM | POA: Diagnosis not present

## 2015-06-16 DIAGNOSIS — M6281 Muscle weakness (generalized): Secondary | ICD-10-CM | POA: Diagnosis not present

## 2015-06-17 DIAGNOSIS — M6281 Muscle weakness (generalized): Secondary | ICD-10-CM | POA: Diagnosis not present

## 2015-06-18 DIAGNOSIS — M6281 Muscle weakness (generalized): Secondary | ICD-10-CM | POA: Diagnosis not present

## 2015-06-19 DIAGNOSIS — M6281 Muscle weakness (generalized): Secondary | ICD-10-CM | POA: Diagnosis not present

## 2015-06-20 DIAGNOSIS — M6281 Muscle weakness (generalized): Secondary | ICD-10-CM | POA: Diagnosis not present

## 2015-06-21 DIAGNOSIS — M6281 Muscle weakness (generalized): Secondary | ICD-10-CM | POA: Diagnosis not present

## 2015-06-22 DIAGNOSIS — M6281 Muscle weakness (generalized): Secondary | ICD-10-CM | POA: Diagnosis not present

## 2015-06-23 DIAGNOSIS — M6281 Muscle weakness (generalized): Secondary | ICD-10-CM | POA: Diagnosis not present

## 2015-06-24 DIAGNOSIS — M6281 Muscle weakness (generalized): Secondary | ICD-10-CM | POA: Diagnosis not present

## 2015-06-25 DIAGNOSIS — M6281 Muscle weakness (generalized): Secondary | ICD-10-CM | POA: Diagnosis not present

## 2015-06-26 ENCOUNTER — Encounter: Payer: Self-pay | Admitting: Adult Health

## 2015-06-26 ENCOUNTER — Non-Acute Institutional Stay (SKILLED_NURSING_FACILITY): Payer: Medicare Other | Admitting: Adult Health

## 2015-06-26 DIAGNOSIS — M25511 Pain in right shoulder: Secondary | ICD-10-CM

## 2015-06-26 DIAGNOSIS — M25519 Pain in unspecified shoulder: Secondary | ICD-10-CM | POA: Insufficient documentation

## 2015-06-26 DIAGNOSIS — J449 Chronic obstructive pulmonary disease, unspecified: Secondary | ICD-10-CM | POA: Diagnosis not present

## 2015-06-26 DIAGNOSIS — G6289 Other specified polyneuropathies: Secondary | ICD-10-CM

## 2015-06-26 DIAGNOSIS — I10 Essential (primary) hypertension: Secondary | ICD-10-CM

## 2015-06-26 DIAGNOSIS — G4700 Insomnia, unspecified: Secondary | ICD-10-CM | POA: Diagnosis not present

## 2015-06-26 DIAGNOSIS — M6281 Muscle weakness (generalized): Secondary | ICD-10-CM | POA: Diagnosis not present

## 2015-06-26 DIAGNOSIS — G629 Polyneuropathy, unspecified: Secondary | ICD-10-CM | POA: Insufficient documentation

## 2015-06-26 NOTE — Assessment & Plan Note (Signed)
Improved with ultram at night but continue to have pain that wakes her up at night. Will refer to ortho. She has declined in surgical intervention but would be interested in joint injection if this was an option

## 2015-06-26 NOTE — Assessment & Plan Note (Signed)
Start neurontin 100 mg qhs

## 2015-06-26 NOTE — Assessment & Plan Note (Signed)
Stable without sob or cough.  Continue advair and prn albuterol. D/C scheduled albuterl

## 2015-06-26 NOTE — Assessment & Plan Note (Signed)
Controlled, continue diovan and hctz

## 2015-06-26 NOTE — Progress Notes (Signed)
Patient ID: Leslie Farley, female   DOB: [birth year], 82 y.o.   MRN: [id number]  Location:  [facility] Room Number: 202 B Place of Service:  SNF (31) Provider:   Peggye Ley, ANP [facility] [phone]   Kirt Boys, DO  Patient Care Team: Kirt Boys, DO as PCP - General (Internal Medicine)  Extended Emergency Contact Information Primary Emergency Contact: Guillaume,Nathalie Address: [address] [place], [place] [address] [place] of [place] Home Phone: [phone] Mobile Phone: [phone] Relation: Daughter Secondary Emergency Contact: Maris Berger [place] Home Phone: [phone] Mobile Phone: [phone] Relation: Daughter  Code Status:  Full code Goals of care: Advanced Directive information Advanced Directives 06/26/2015  Does patient have an advance directive? No  Would patient like information on creating an advanced directive? No - patient declined information     Chief Complaint  Patient presents with  . Medical Management of Chronic Issues    Routine Visit    HPI:  Pt is a 82 y.o. female seen today for medical management of chronic diseases.  Residing at [facility] after a fall with ankle fx and subsequent PE in 2016.  She continues to reside in skilled care due to her need for assistance with mobility and adl's.  She has a long standing complaint of right shoulder pain. She reports she saw an orthopedist in the past and but declined surgery.  Has improved pain with schedule ultram but still gets up at night due to the pain. A CT performed of her neck 2016, showed joint space narrowing at the C5-7 level. Her CT scan done in Sept of 2016 for SOB where they found a PE also noted moth like appearance to her bones, suggestive of osteopenia. She reported an "electrical sensation" that was painful at times to both feet and the right arm.  Past Medical History  Diagnosis Date    . COPD (chronic obstructive pulmonary disease) (HCC)   . Asthma   . Hypertension   . GERD (gastroesophageal reflux disease)   . Arthritis    Past Surgical History  Procedure Laterality Date  . Cholecystectomy      No Known Allergies    Medication List       This list is accurate as of: 06/26/15 10:20 AM.  Always use your most recent med list.               acetaminophen 500 MG tablet  Commonly known as:  TYLENOL  Take 500-1,000 mg by mouth every 6 (six) hours as needed for mild pain or moderate pain.     albuterol 108 (90 Base) MCG/ACT inhaler  Commonly known as:  PROVENTIL HFA;VENTOLIN HFA  Inhale 1-2 puffs into the lungs every 6 (six) hours as needed for wheezing or shortness of breath.     albuterol (2.5 MG/3ML) 0.083% nebulizer solution  Commonly known as:  PROVENTIL  Take 2.5 mg by nebulization every 6 (six) hours as needed for wheezing or shortness of breath.     Carboxymethylcellulose Sodium 0.25 % Soln  Place 1 drop into each eye every morning and at daily at bedtime.     esomeprazole 40 MG capsule  Commonly known as:  NEXIUM  Take 40 mg by mouth daily at 12 noon.     Fluticasone-Salmeterol 500-50 MCG/DOSE Aepb  Commonly known as:  ADVAIR  Inhale 1 puff into the lungs 2 (two) times daily.  hydrochlorothiazide 12.5 MG capsule  Commonly known as:  MICROZIDE  Take 12.5 mg by mouth daily.     ipratropium-albuterol 0.5-2.5 (3) MG/3ML Soln  Commonly known as:  DUONEB  Inhale contents of one vial twice daily for COPD.     meclizine 12.5 MG tablet  Commonly known as:  ANTIVERT  Take 12.5 mg by mouth 3 (three) times daily as needed for dizziness.     montelukast 10 MG tablet  Commonly known as:  SINGULAIR  Take 10 mg by mouth at bedtime.     sennosides-docusate sodium 8.6-50 MG tablet  Commonly known as:  SENOKOT-S  Take 1 tablet by mouth daily.     traMADol 50 MG tablet  Commonly known as:  ULTRAM  Take 50 mg by mouth every 6 (six) hours as  needed. Give 1 tablet every night at bedtime. **Scheduled**     valsartan 80 MG tablet  Commonly known as:  DIOVAN  Take 80 mg by mouth daily.        Review of Systems  Constitutional: Negative for fever, chills, diaphoresis, activity change, appetite change, fatigue and unexpected weight change.  HENT: Negative for congestion and postnasal drip.   Respiratory: Negative for cough and shortness of breath.   Cardiovascular: Negative for chest pain, palpitations and leg swelling.  Gastrointestinal: Negative for diarrhea, constipation and abdominal distention.  Genitourinary: Negative for dysuria and difficulty urinating.  Musculoskeletal: Positive for joint swelling, arthralgias and gait problem. Negative for myalgias, back pain, neck pain and neck stiffness.  Neurological: Negative for dizziness, facial asymmetry and speech difficulty.       Electrical impulses to right arm and feet  Psychiatric/Behavioral: Negative for behavioral problems, confusion and agitation.    Immunization History  Administered Date(s) Administered  . Influenza,inj,Quad PF,36+ Mos 10/31/2014  . PPD Test 11/02/2014   Pertinent  Health Maintenance Due  Topic Date Due  . PNA vac Low Risk Adult (1 of 2 - PCV13) 04/07/2016 (Originally 02/19/1998)  . DEXA SCAN  04/13/2023 (Originally 02/19/1998)  . INFLUENZA VACCINE  09/24/2015   No flowsheet data found. Functional Status Survey:    Filed Vitals:   06/26/15 1005  BP: 124/79  Pulse: 80  Temp: 97.4 F (36.3 C)  TempSrc: Oral  Resp: 16  Height: 5\' 5"  (1.651 m)  Weight: 201 lb (91.173 kg)   Body mass index is 33.45 kg/(m^2). Physical Exam  Constitutional: She is oriented to person, place, and time. No distress.  HENT:  Head: Normocephalic and atraumatic.  Neck: No JVD present. No thyromegaly present.  Cardiovascular: Normal rate and regular rhythm.   No murmur heard. No edema  Pulmonary/Chest: Effort normal and breath sounds normal. No respiratory  distress.  Abdominal: Soft. Bowel sounds are normal.  Musculoskeletal: She exhibits tenderness (right shoulder with decreased ROM, no swelling). She exhibits no edema.  Neurological: She is oriented to person, place, and time.  Skin: Skin is warm and dry. She is not diaphoretic.  Psychiatric: She has a normal mood and affect.    Labs reviewed:  Recent Labs  10/01/14 0039 10/30/14 1809 10/31/14 0515 11/06/14 04/09/15 05/02/15  NA 138 139 136 136* 138 139  K 3.7 3.6 3.7 3.8 4.0 3.8  CL 107 103 103  --   --   --   CO2 27 28 23   --   --   --   GLUCOSE 130* 116* 145*  --   --   --   BUN 21* 13 16 16  18 17  CREATININE 1.01* 0.84 0.79 0.9 0.9 0.9  CALCIUM 9.3 10.3 9.7  --   --   --     Recent Labs  10/01/14 0039 10/31/14 0515 04/09/15  AST 46* 22 13  ALT 34 22 9  ALKPHOS 57 69 50  BILITOT 0.4 0.7  --   PROT 6.9 6.8  --   ALBUMIN 3.9 3.6  --     Recent Labs  10/01/14 0039 10/30/14 1809 10/31/14 0515 11/01/14 0520 11/06/14 04/09/15 05/02/15  WBC 7.7 7.7 5.6 11.7* 6.5 6.1 6.5  NEUTROABS 5.6 4.4  --   --   --   --   --   HGB 11.3* 13.3 11.5* 10.8* 11.8* 11.0* 10.4*  HCT 34.4* 39.6 33.9* 32.4* 37 33* 30*  MCV 91.2 92.3 90.9 91.5  --   --   --   PLT 230 211 226 250 222 213 213   Lab Results  Component Value Date   TSH 0.958 11/02/2014   No results found for: HGBA1C No results found for: CHOL, HDL, LDLCALC, LDLDIRECT, TRIG, CHOLHDL  Significant Diagnostic Results in last 30 days:  No results found.  Assessment/Plan COLD (chronic obstructive lung disease) Stable without sob or cough.  Continue advair and prn albuterol. D/C scheduled albuterl  Hypertension Controlled, continue diovan and hctz  Peripheral neuropathy (HCC) Start neurontin 100 mg qhs  Pain in joint, shoulder region Improved with ultram at night but continue to have pain that wakes her up at night. Will refer to ortho. She has declined in surgical intervention but would be interested in joint  injection if this was an option  Recommend dexa scan due to ?? OP on CT scan. Resident is still ambulatory.  Family/ staff Communication: discussed with resident  Labs/tests ordered:  NA

## 2015-06-27 DIAGNOSIS — M6281 Muscle weakness (generalized): Secondary | ICD-10-CM | POA: Diagnosis not present

## 2015-06-28 DIAGNOSIS — M6281 Muscle weakness (generalized): Secondary | ICD-10-CM | POA: Diagnosis not present

## 2015-07-02 ENCOUNTER — Other Ambulatory Visit: Payer: Self-pay | Admitting: Internal Medicine

## 2015-07-02 DIAGNOSIS — M6281 Muscle weakness (generalized): Secondary | ICD-10-CM | POA: Diagnosis not present

## 2015-07-02 DIAGNOSIS — E2839 Other primary ovarian failure: Secondary | ICD-10-CM

## 2015-07-03 DIAGNOSIS — M6281 Muscle weakness (generalized): Secondary | ICD-10-CM | POA: Diagnosis not present

## 2015-07-04 DIAGNOSIS — M6281 Muscle weakness (generalized): Secondary | ICD-10-CM | POA: Diagnosis not present

## 2015-07-05 DIAGNOSIS — M6281 Muscle weakness (generalized): Secondary | ICD-10-CM | POA: Diagnosis not present

## 2015-07-06 DIAGNOSIS — M6281 Muscle weakness (generalized): Secondary | ICD-10-CM | POA: Diagnosis not present

## 2015-07-08 DIAGNOSIS — M6281 Muscle weakness (generalized): Secondary | ICD-10-CM | POA: Diagnosis not present

## 2015-07-09 DIAGNOSIS — M6281 Muscle weakness (generalized): Secondary | ICD-10-CM | POA: Diagnosis not present

## 2015-07-10 DIAGNOSIS — M6281 Muscle weakness (generalized): Secondary | ICD-10-CM | POA: Diagnosis not present

## 2015-07-11 DIAGNOSIS — M6281 Muscle weakness (generalized): Secondary | ICD-10-CM | POA: Diagnosis not present

## 2015-07-12 DIAGNOSIS — M6281 Muscle weakness (generalized): Secondary | ICD-10-CM | POA: Diagnosis not present

## 2015-07-13 DIAGNOSIS — M6281 Muscle weakness (generalized): Secondary | ICD-10-CM | POA: Diagnosis not present

## 2015-07-15 DIAGNOSIS — M6281 Muscle weakness (generalized): Secondary | ICD-10-CM | POA: Diagnosis not present

## 2015-07-16 ENCOUNTER — Ambulatory Visit
Admission: RE | Admit: 2015-07-16 | Discharge: 2015-07-16 | Disposition: A | Discharge status: Home / Self Care | Payer: Medicare Other | Source: Ambulatory Visit | Attending: Internal Medicine | Admitting: Internal Medicine

## 2015-07-16 DIAGNOSIS — Z13820 Encounter for screening for osteoporosis: Secondary | ICD-10-CM | POA: Diagnosis not present

## 2015-07-16 DIAGNOSIS — E2839 Other primary ovarian failure: Secondary | ICD-10-CM

## 2015-07-16 DIAGNOSIS — Z780 Asymptomatic menopausal state: Secondary | ICD-10-CM | POA: Diagnosis not present

## 2015-07-17 DIAGNOSIS — M6281 Muscle weakness (generalized): Secondary | ICD-10-CM | POA: Diagnosis not present

## 2015-07-18 DIAGNOSIS — M6281 Muscle weakness (generalized): Secondary | ICD-10-CM | POA: Diagnosis not present

## 2015-07-18 DIAGNOSIS — G4700 Insomnia, unspecified: Secondary | ICD-10-CM | POA: Diagnosis not present

## 2015-07-19 DIAGNOSIS — M6281 Muscle weakness (generalized): Secondary | ICD-10-CM | POA: Diagnosis not present

## 2015-07-20 DIAGNOSIS — M6281 Muscle weakness (generalized): Secondary | ICD-10-CM | POA: Diagnosis not present

## 2015-07-22 DIAGNOSIS — M6281 Muscle weakness (generalized): Secondary | ICD-10-CM | POA: Diagnosis not present

## 2015-07-23 DIAGNOSIS — M6281 Muscle weakness (generalized): Secondary | ICD-10-CM | POA: Diagnosis not present

## 2015-07-24 DIAGNOSIS — M6281 Muscle weakness (generalized): Secondary | ICD-10-CM | POA: Diagnosis not present

## 2015-07-25 DIAGNOSIS — M6281 Muscle weakness (generalized): Secondary | ICD-10-CM | POA: Diagnosis not present

## 2015-07-26 DIAGNOSIS — M6281 Muscle weakness (generalized): Secondary | ICD-10-CM | POA: Diagnosis not present

## 2015-07-27 DIAGNOSIS — M6281 Muscle weakness (generalized): Secondary | ICD-10-CM | POA: Diagnosis not present

## 2015-07-29 DIAGNOSIS — M6281 Muscle weakness (generalized): Secondary | ICD-10-CM | POA: Diagnosis not present

## 2015-07-30 DIAGNOSIS — M6281 Muscle weakness (generalized): Secondary | ICD-10-CM | POA: Diagnosis not present

## 2015-07-30 DIAGNOSIS — M25511 Pain in right shoulder: Secondary | ICD-10-CM | POA: Diagnosis not present

## 2015-07-31 DIAGNOSIS — M6281 Muscle weakness (generalized): Secondary | ICD-10-CM | POA: Diagnosis not present

## 2015-08-01 DIAGNOSIS — M6281 Muscle weakness (generalized): Secondary | ICD-10-CM | POA: Diagnosis not present

## 2015-08-02 DIAGNOSIS — M6281 Muscle weakness (generalized): Secondary | ICD-10-CM | POA: Diagnosis not present

## 2015-08-07 DIAGNOSIS — G4700 Insomnia, unspecified: Secondary | ICD-10-CM | POA: Diagnosis not present

## 2015-08-09 ENCOUNTER — Non-Acute Institutional Stay (SKILLED_NURSING_FACILITY): Payer: Medicare Other | Admitting: Nurse Practitioner

## 2015-08-09 DIAGNOSIS — M1990 Unspecified osteoarthritis, unspecified site: Secondary | ICD-10-CM

## 2015-08-09 DIAGNOSIS — K5909 Other constipation: Secondary | ICD-10-CM

## 2015-08-09 DIAGNOSIS — K219 Gastro-esophageal reflux disease without esophagitis: Secondary | ICD-10-CM | POA: Diagnosis not present

## 2015-08-09 DIAGNOSIS — I10 Essential (primary) hypertension: Secondary | ICD-10-CM | POA: Diagnosis not present

## 2015-08-09 DIAGNOSIS — G6289 Other specified polyneuropathies: Secondary | ICD-10-CM

## 2015-08-09 DIAGNOSIS — K5900 Constipation, unspecified: Secondary | ICD-10-CM

## 2015-08-09 DIAGNOSIS — J449 Chronic obstructive pulmonary disease, unspecified: Secondary | ICD-10-CM | POA: Diagnosis not present

## 2015-08-09 NOTE — Progress Notes (Signed)
Nursing Home Location:  [facility]   Place of Service: SNF (31)  PCP: [place] Boys[place], Monica, DO  No Known Allergies  Chief Complaint  Patient presents with  . Medical Management of Chronic Issues    Routine Visit    HPI:  Patient is a 82 y.o. female seen today at [facility] for routine follow up.Pt with hx of COPD, HTN, GERD, PE, constipation. She has been doing well in the last month. pt being followed by psych and trazodone has been adjusted due to insomnia. Pt also had gabapentin added for neuropathy which she reports is helping. Pt reports worsening constipation at this time. Nursing without acute concerns   Review of Systems:  Review of Systems  Constitutional: Negative for fever, chills, diaphoresis, activity change, appetite change, fatigue and unexpected weight change.  HENT: Negative for congestion and postnasal drip.   Respiratory: Negative for cough and shortness of breath.   Cardiovascular: Negative for chest pain, palpitations and leg swelling.  Gastrointestinal: Positive for constipation. Negative for diarrhea and abdominal distention.  Genitourinary: Negative for dysuria and difficulty urinating.  Musculoskeletal: Positive for joint swelling, arthralgias and gait problem. Negative for myalgias, back pain, neck pain and neck stiffness.  Neurological: Negative for dizziness, facial asymmetry and speech difficulty.  Psychiatric/Behavioral: Negative for behavioral problems, confusion and agitation.    Past Medical History  Diagnosis Date  . COPD (chronic obstructive pulmonary disease) (HCC)   . Asthma   . Hypertension   . GERD (gastroesophageal reflux disease)   . Arthritis    Past Surgical History  Procedure Laterality Date  . Cholecystectomy     Social History:   reports that she quit smoking about 44 years ago. She has never used smokeless tobacco. She reports that she does not drink alcohol or use illicit drugs.  Family  History  Problem Relation Age of Onset  . Cancer - Other Mother     Died of throat cancer  . Cancer - Prostate Father   . Diabetes Brother     Medications: Patient's Medications  New Prescriptions   No medications on file  Previous Medications   ACETAMINOPHEN (TYLENOL) 500 MG TABLET    Take 500-1,000 mg by mouth every 6 (six) hours as needed for mild pain or moderate pain.   ALBUTEROL (PROVENTIL HFA;VENTOLIN HFA) 108 (90 BASE) MCG/ACT INHALER    Inhale 1-2 puffs into the lungs every 6 (six) hours as needed for wheezing or shortness of breath.   CARBOXYMETHYLCELLULOSE SODIUM 0.25 % SOLN    Place 1 drop into each eye every morning and at daily at bedtime.   ESOMEPRAZOLE (NEXIUM) 40 MG CAPSULE    Take 40 mg by mouth daily at 12 noon.   FLUTICASONE-SALMETEROL (ADVAIR) 500-50 MCG/DOSE AEPB    Inhale 1 puff into the lungs 2 (two) times daily.   GABAPENTIN (NEURONTIN) 100 MG CAPSULE    Take 100 mg by mouth at bedtime.   HYDROCHLOROTHIAZIDE (MICROZIDE) 12.5 MG CAPSULE    Take 12.5 mg by mouth daily.   MECLIZINE (ANTIVERT) 12.5 MG TABLET    Take 12.5 mg by mouth 3 (three) times daily as needed for dizziness.   MONTELUKAST (SINGULAIR) 10 MG TABLET    Take 10 mg by mouth at bedtime.   SENNOSIDES-DOCUSATE SODIUM (SENOKOT-S) 8.6-50 MG TABLET    Take 1 tablet by mouth at bedtime.    TRAMADOL (ULTRAM) 50 MG TABLET    Take 50 mg by mouth every 6 (  six) hours as needed. Give 1 tablet every night at bedtime. **Scheduled**   TRAZODONE (DESYREL) 50 MG TABLET    Take 50 mg by mouth at bedtime.   VALSARTAN (DIOVAN) 80 MG TABLET    Take 80 mg by mouth daily.  Modified Medications   No medications on file  Discontinued Medications   ALBUTEROL (PROVENTIL IN)    Administer 1 vial via nebulizer every 6 hours as needed for SOB   TRAZODONE (DESYREL) 25 MG TABS TABLET    Take 25 mg by mouth at bedtime.     Physical Exam: Filed Vitals:   08/09/15 0955  BP: 128/76  Pulse: 74  Temp: 97.4 F (36.3 C)  TempSrc:  Oral  Resp: 20  Height:  (1.651 m)  Weight: 202 lb 6.4 oz (91.808 kg)    Physical Exam  Constitutional: She is oriented to person, place, and time. No distress.  HENT:  Head: Normocephalic and atraumatic.  Neck: No JVD present. No thyromegaly present.  Cardiovascular: Normal rate and regular rhythm.   No murmur heard. No edema  Pulmonary/Chest: Effort normal and breath sounds normal. No respiratory distress.  Abdominal: Soft. Bowel sounds are normal.  Musculoskeletal: She exhibits tenderness (right shoulder with decreased ROM, no swelling). She exhibits no edema.  Neurological: She is oriented to person, place, and time.  Skin: Skin is warm and dry. She is not diaphoretic.  Psychiatric: She has a normal mood and affect.    Labs reviewed: Basic Metabolic Panel:  Recent Labs  16/10/96 0039 10/30/14 1809 10/31/14 0515 11/06/14 04/09/15 05/02/15  NA 138 139 136 136* 138 139  K 3.7 3.6 3.7 3.8 4.0 3.8  CL 107 103 103  --   --   --   CO2 --   --   --   GLUCOSE 130* 116* 145*  --   --   --   BUN 21* CREATININE 1.01* 0.84 0.79 0.9 0.9 0.9  CALCIUM 9.3 10.3 9.7  --   --   --    Liver Function Tests:  Recent Labs  10/01/14 0039 10/31/14 0515 04/09/15  AST 46* 22 13  ALT 34 22 9  ALKPHOS 57 69 50  BILITOT 0.4 0.7  --   PROT 6.9 6.8  --   ALBUMIN 3.9 3.6  --    No results for input(s): LIPASE, AMYLASE in the last 8760 hours. No results for input(s): AMMONIA in the last 8760 hours. CBC:  Recent Labs  10/01/14 0039 10/30/14 1809 10/31/14 0515 11/01/14 0520 11/06/14 04/09/15 05/02/15  WBC 7.7 7.7 5.6 11.7* 6.5 6.1 6.5  NEUTROABS 5.6 4.4  --   --   --   --   --   HGB 11.3* 13.3 11.5* 10.8* 11.8* 11.0* 10.4*  HCT 34.4* 39.6 33.9* 32.4* 37 33* 30*  MCV 91.2 92.3 90.9 91.5  --   --   --   PLT 230 211 226 250 222 213 213   TSH:  Recent Labs  11/02/14 1004  TSH 0.958   A1C: No results found for: HGBA1C Lipid Panel: No results  for input(s): CHOL, HDL, LDLCALC, TRIG, CHOLHDL, LDLDIRECT in the last 8760 hours.   Assessment/Plan 1. Other polyneuropathy (HCC) Improved on gabapentin, will cont at this time  2. Chronic obstructive pulmonary disease, unspecified COPD type (HCC) COPD remains stable, no recent exacerbations. Cont advair, singulair, and as needed albuterol   3. Essential hypertension Blood  pressure stable. Maintained on valsartan, HCTZ  4. Gastroesophageal reflux disease without esophagitis -occasional symptoms. conts on nexium 40 mg daily   5. Chronic constipation -reports constipation as a problem. Will increase sennna s to BID  6. Arthritis Chronic OA, conts on tylenol and ultram .     Shyloh Krinke K. Biagio Borg  [facility] [phone] 8 am - 5 pm) [phone] (after hours)

## 2015-08-19 ENCOUNTER — Other Ambulatory Visit: Payer: Self-pay | Admitting: *Deleted

## 2015-08-19 DIAGNOSIS — G4700 Insomnia, unspecified: Secondary | ICD-10-CM | POA: Diagnosis not present

## 2015-08-19 MED ORDER — TRAMADOL HCL 50 MG PO TABS
ORAL_TABLET | ORAL | Status: DC
Start: 2015-08-19 — End: 2015-09-03

## 2015-08-19 NOTE — Telephone Encounter (Signed)
[facility] 

## 2015-09-03 ENCOUNTER — Other Ambulatory Visit: Payer: Self-pay

## 2015-09-03 MED ORDER — TRAMADOL HCL 50 MG PO TABS: ORAL_TABLET | ORAL | Status: DC

## 2015-09-03 NOTE — Telephone Encounter (Signed)
Prescription request was received from:    [facility] [address] [place] [address]  Phone: [phone] Fax: [phone] 

## 2015-09-11 DIAGNOSIS — M79671 Pain in right foot: Secondary | ICD-10-CM | POA: Diagnosis not present

## 2015-09-11 DIAGNOSIS — B351 Tinea unguium: Secondary | ICD-10-CM | POA: Diagnosis not present

## 2015-09-11 DIAGNOSIS — M79672 Pain in left foot: Secondary | ICD-10-CM | POA: Diagnosis not present

## 2015-09-13 ENCOUNTER — Encounter: Payer: Self-pay | Admitting: Nurse Practitioner

## 2015-09-13 ENCOUNTER — Non-Acute Institutional Stay (SKILLED_NURSING_FACILITY): Payer: Medicare Other | Admitting: Nurse Practitioner

## 2015-09-13 DIAGNOSIS — M1990 Unspecified osteoarthritis, unspecified site: Secondary | ICD-10-CM | POA: Diagnosis not present

## 2015-09-13 DIAGNOSIS — I10 Essential (primary) hypertension: Secondary | ICD-10-CM | POA: Diagnosis not present

## 2015-09-13 DIAGNOSIS — K5900 Constipation, unspecified: Secondary | ICD-10-CM | POA: Diagnosis not present

## 2015-09-13 DIAGNOSIS — G6289 Other specified polyneuropathies: Secondary | ICD-10-CM

## 2015-09-13 DIAGNOSIS — K5909 Other constipation: Secondary | ICD-10-CM

## 2015-09-13 DIAGNOSIS — G4700 Insomnia, unspecified: Secondary | ICD-10-CM | POA: Diagnosis not present

## 2015-09-13 NOTE — Progress Notes (Signed)
Nursing Home Location:  [facility]  Place of Service: SNF (31)  PCP: Kirt Boysarter, Monica, DO  No Known Allergies  Chief Complaint  Patient presents with  . Medical Management of Chronic Issues    Routine Visit    HPI:  Patient is a 82 y.o. female [facility] at [facility] for [facility] follow up.Pt with hx of COPD, HTN, GERD, PE, constipation. Pt has done well in the last month. No acute issues or illnesses. Constipation well controlled on current regimen. No concerns or complaints today.  Nursing without concerns.   Review of Systems:  Review of Systems  Constitutional: Negative for fever, chills, diaphoresis, activity change, appetite change, fatigue and unexpected weight change.  HENT: Negative for congestion and postnasal drip.   Respiratory: Negative for cough and shortness of breath.   Cardiovascular: Negative for chest pain, palpitations and leg swelling.  Gastrointestinal: Positive for constipation (well controlled). Negative for diarrhea and abdominal distention.  Genitourinary: Negative for dysuria and difficulty urinating.  Musculoskeletal: Positive for arthralgias and gait problem. Negative for myalgias, back pain, joint swelling, neck pain and neck stiffness.  Neurological: Negative for dizziness, facial asymmetry and speech difficulty.  Psychiatric/Behavioral: Negative for behavioral problems, confusion and agitation.    Past Medical History  Diagnosis Date  . COPD (chronic obstructive pulmonary disease) (HCC)   . Asthma   . Hypertension   . GERD (gastroesophageal reflux disease)   . Arthritis    Past Surgical History  Procedure Laterality Date  . Cholecystectomy     Social History:   reports that she quit smoking about 44 years ago. She has never used smokeless tobacco. She reports that she does not drink alcohol or use illicit drugs.  Family History  Problem Relation Age of Onset  . Cancer - Other Mother     Died of throat cancer  .  Cancer - Prostate Father   . Diabetes Brother     Medications: Patient's Medications  New Prescriptions   No medications on file  Previous Medications   ACETAMINOPHEN (TYLENOL) 500 MG TABLET    Take 500-1,000 mg by mouth every 6 (six) hours as needed for mild pain or moderate pain.   ALBUTEROL (PROVENTIL HFA;VENTOLIN HFA) 108 (90 BASE) MCG/ACT INHALER    Inhale 1-2 puffs into the lungs every 6 (six) hours as needed for wheezing or shortness of breath.   CARBOXYMETHYLCELLULOSE SODIUM 0.25 % SOLN    Place 1 drop into each eye every morning and at daily at bedtime.   ESOMEPRAZOLE (NEXIUM) 40 MG CAPSULE    Take 40 mg by mouth daily at 12 noon.   FLUTICASONE-SALMETEROL (ADVAIR) 500-50 MCG/DOSE AEPB    Inhale 1 puff into the lungs 2 (two) times daily.   GABAPENTIN (NEURONTIN) 100 MG CAPSULE    Take 100 mg by mouth at bedtime.   HYDROCHLOROTHIAZIDE (MICROZIDE) 12.5 MG CAPSULE    Take 12.5 mg by mouth daily.   MECLIZINE (ANTIVERT) 12.5 MG TABLET    Take 12.5 mg by mouth 3 (three) times daily as needed for dizziness.   MONTELUKAST (SINGULAIR) 10 MG TABLET    Take 10 mg by mouth at bedtime.   NITROGLYCERIN (NITROSTAT) 0.4 MG SL TABLET    Place 0.4 mg under the tongue every 5 (five) minutes as needed for chest pain (Do not exceed 3 doses per episode.).   SENNOSIDES-DOCUSATE SODIUM (SENOKOT-S) 8.6-50 MG TABLET    Take 1 tablet by mouth 2 (two) times daily.  TRAMADOL (ULTRAM) 50 MG TABLET    Take one tablet by mouth at bedtime for pain   TRAZODONE (DESYREL) 50 MG TABLET    Take 50 mg by mouth at bedtime.   VALSARTAN (DIOVAN) 80 MG TABLET    Take 80 mg by mouth daily.  Modified Medications   No medications on file  Discontinued Medications   No medications on file     Physical Exam: Filed Vitals:   09/13/15 1043  BP: 128/76  Pulse: 80  Temp: 97.5 F (36.4 C)  TempSrc: Oral  Resp: 18  Height:  (1.651 m)  Weight: 199 lb 3.2 oz (90.357 kg)    Physical Exam  Constitutional: She is  oriented to person, place, and time. No distress.  HENT:  Head: Normocephalic and atraumatic.  Neck: No JVD present. No thyromegaly present.  Cardiovascular: Normal rate and regular rhythm.   No murmur heard. No edema  Pulmonary/Chest: Effort normal and breath sounds normal. No respiratory distress.  Abdominal: Soft. Bowel sounds are normal.  Musculoskeletal: She exhibits tenderness (right shoulder with decreased ROM, no swelling). She exhibits no edema.  Neurological: She is oriented to person, place, and time.  Skin: Skin is warm and dry. She is not diaphoretic.  Psychiatric: She has a normal mood and affect.    Labs reviewed: Basic Metabolic Panel:  Recent Labs  78/29/56 0039 10/30/14 1809 10/31/14 0515 11/06/14 04/09/15 05/02/15  NA 138 139 136 136* 138 139  K 3.7 3.6 3.7 3.8 4.0 3.8  CL 107 103 103  --   --   --   CO2 --   --   --   GLUCOSE 130* 116* 145*  --   --   --   BUN 21* CREATININE 1.01* 0.84 0.79 0.9 0.9 0.9  CALCIUM 9.3 10.3 9.7  --   --   --    Liver Function Tests:  Recent Labs  10/01/14 0039 10/31/14 0515 04/09/15  AST 46* 22 13  ALT 34 22 9  ALKPHOS 57 69 50  BILITOT 0.4 0.7  --   PROT 6.9 6.8  --   ALBUMIN 3.9 3.6  --    No results for input(s): LIPASE, AMYLASE in the last 8760 hours. No results for input(s): AMMONIA in the last 8760 hours. CBC:  Recent Labs  10/01/14 0039 10/30/14 1809 10/31/14 0515 11/01/14 0520 11/06/14 04/09/15 05/02/15  WBC 7.7 7.7 5.6 11.7* 6.5 6.1 6.5  NEUTROABS 5.6 4.4  --   --   --   --   --   HGB 11.3* 13.3 11.5* 10.8* 11.8* 11.0* 10.4*  HCT 34.4* 39.6 33.9* 32.4* 37 33* 30*  MCV 91.2 92.3 90.9 91.5  --   --   --   PLT 230 211 226 250 222 213 213   TSH:  Recent Labs  11/02/14 1004  TSH 0.958   A1C: No results found for: HGBA1C Lipid Panel: No results for input(s): CHOL, HDL, LDLCALC, TRIG, CHOLHDL, LDLDIRECT in the last 8760 hours.   Assessment/Plan 1. Essential  hypertension Blood pressure controlled on hctz  2. Other polyneuropathy (HCC) Stable on gabapentin 100 mg qhs  3. Chronic constipation Improved control on senokot BID   4. Arthritis -stable on tylenol and ultram at night   5. Insomnia Controlled on trazodone  Labs- will follow up cbc, bmp and lipid panel.    Janene Harvey. Biagio Borg  [facility] [phone] 8 am - 5 pm) [phone] (after hours)

## 2015-09-14 DIAGNOSIS — E785 Hyperlipidemia, unspecified: Secondary | ICD-10-CM | POA: Diagnosis not present

## 2015-09-14 DIAGNOSIS — Z7901 Long term (current) use of anticoagulants: Secondary | ICD-10-CM | POA: Diagnosis not present

## 2015-09-14 DIAGNOSIS — J449 Chronic obstructive pulmonary disease, unspecified: Secondary | ICD-10-CM | POA: Diagnosis not present

## 2015-09-15 LAB — BASIC METABOLIC PANEL
BUN: 14 mg/dL (ref 4–21)
CREATININE: 0.8 mg/dL (ref 0.5–1.1)
Glucose: 93 mg/dL
Potassium: 3.7 mmol/L (ref 3.4–5.3)
SODIUM: 137 mmol/L (ref 137–147)

## 2015-09-15 LAB — CBC AND DIFFERENTIAL
HCT: 32 % — AB (ref 36–46)
Hemoglobin: 10.1 g/dL — AB (ref 12.0–16.0)
PLATELETS: 222 10*3/uL (ref 150–399)
WBC: 6.3 10*3/mL

## 2015-09-15 LAB — HEPATIC FUNCTION PANEL
ALK PHOS: 57 U/L (ref 25–125)
ALT: 8 U/L (ref 7–35)
AST: 14 U/L (ref 13–35)
BILIRUBIN, TOTAL: 0.3 mg/dL

## 2015-09-15 LAB — LIPID PANEL
Cholesterol: 181 mg/dL (ref 0–200)
HDL: 64 mg/dL (ref 35–70)
LDL Cholesterol: 100 mg/dL
TRIGLYCERIDES: 84 mg/dL (ref 40–160)

## 2015-10-04 ENCOUNTER — Non-Acute Institutional Stay (SKILLED_NURSING_FACILITY): Payer: Medicare Other | Admitting: Nurse Practitioner

## 2015-10-04 ENCOUNTER — Encounter: Payer: Self-pay | Admitting: Nurse Practitioner

## 2015-10-04 DIAGNOSIS — K219 Gastro-esophageal reflux disease without esophagitis: Secondary | ICD-10-CM | POA: Diagnosis not present

## 2015-10-04 DIAGNOSIS — M25511 Pain in right shoulder: Secondary | ICD-10-CM

## 2015-10-04 DIAGNOSIS — K5909 Other constipation: Secondary | ICD-10-CM

## 2015-10-04 DIAGNOSIS — G6289 Other specified polyneuropathies: Secondary | ICD-10-CM | POA: Diagnosis not present

## 2015-10-04 DIAGNOSIS — J42 Unspecified chronic bronchitis: Secondary | ICD-10-CM

## 2015-10-04 DIAGNOSIS — I10 Essential (primary) hypertension: Secondary | ICD-10-CM | POA: Diagnosis not present

## 2015-10-04 DIAGNOSIS — K5900 Constipation, unspecified: Secondary | ICD-10-CM | POA: Diagnosis not present

## 2015-10-04 NOTE — Progress Notes (Signed)
Nursing Home Location:  [facility]  Place of Service: SNF (31)  PCP: Kirt Boys, DO  No Known Allergies  Chief Complaint  Patient presents with  . Medical Management of Chronic Issues    Routine Visit    HPI:  Patient is a 82 y.o. female seen today at [facility] for routine follow up.Pt with hx of COPD, HTN, GERD, PE, constipation. Pt has been doing well in the last month but reports increase shoulder pain with decrease ROM. This has been a chronic problem for her and therapy has worked with her before in the past. Constipation remains controlled. No increase in anxiety or depression. Sleeping well. No worsening of shortness of breath or chest pains.   Review of Systems:  Review of Systems  Constitutional: Negative for activity change, appetite change, chills, diaphoresis, fatigue, fever and unexpected weight change.  HENT: Negative for congestion and postnasal drip.   Respiratory: Negative for cough and shortness of breath.   Cardiovascular: Negative for chest pain, palpitations and leg swelling.  Gastrointestinal: Positive for constipation (well controlled). Negative for abdominal distention and diarrhea.  Genitourinary: Negative for difficulty urinating and dysuria.  Musculoskeletal: Positive for arthralgias and gait problem. Negative for back pain, joint swelling, myalgias, neck pain and neck stiffness.  Neurological: Negative for dizziness, facial asymmetry and speech difficulty.  Psychiatric/Behavioral: Negative for agitation, behavioral problems and confusion.    Past Medical History:  Diagnosis Date  . Arthritis   . Asthma   . COPD (chronic obstructive pulmonary disease) (HCC)   . GERD (gastroesophageal reflux disease)   . Hypertension    Past Surgical History:  Procedure Laterality Date  . CHOLECYSTECTOMY     Social History:   reports that she quit smoking about 44 years ago. She has never used smokeless tobacco. She reports that she does  not drink alcohol or use drugs.  Family History  Problem Relation Age of Onset  . Cancer - Other Mother     Died of throat cancer  . Cancer - Prostate Father   . Diabetes Brother     Medications: Patient's Medications  New Prescriptions   No medications on file  Previous Medications   ACETAMINOPHEN (TYLENOL) 500 MG TABLET    Take 500-1,000 mg by mouth every 6 (six) hours as needed for mild pain or moderate pain.   ALBUTEROL (PROVENTIL HFA;VENTOLIN HFA) 108 (90 BASE) MCG/ACT INHALER    Inhale 1-2 puffs into the lungs every 6 (six) hours as needed for wheezing or shortness of breath.   CARBOXYMETHYLCELLULOSE SODIUM 0.25 % SOLN    Place 1 drop into each eye every morning and at daily at bedtime.   ESOMEPRAZOLE (NEXIUM) 40 MG CAPSULE    Take 40 mg by mouth daily at 12 noon.   FLUTICASONE-SALMETEROL (ADVAIR) 500-50 MCG/DOSE AEPB    Inhale 1 puff into the lungs 2 (two) times daily.   GABAPENTIN (NEURONTIN) 100 MG CAPSULE    Take 100 mg by mouth at bedtime.   HYDROCHLOROTHIAZIDE (MICROZIDE) 12.5 MG CAPSULE    Take 12.5 mg by mouth daily.   MECLIZINE (ANTIVERT) 12.5 MG TABLET    Take 12.5 mg by mouth 3 (three) times daily as needed for dizziness.   MONTELUKAST (SINGULAIR) 10 MG TABLET    Take 10 mg by mouth at bedtime.   NITROGLYCERIN (NITROSTAT) 0.4 MG SL TABLET    Place 0.4 mg under the tongue every 5 (five) minutes as needed for chest pain (Do not exceed  3 doses per episode.).   SENNOSIDES-DOCUSATE SODIUM (SENOKOT-S) 8.6-50 MG TABLET    Take 1 tablet by mouth 2 (two) times daily.    TRAMADOL (ULTRAM) 50 MG TABLET    Take one tablet by mouth at bedtime for pain   TRAZODONE (DESYREL) 50 MG TABLET    Take 50 mg by mouth at bedtime.   VALSARTAN (DIOVAN) 80 MG TABLET    Take 80 mg by mouth daily.  Modified Medications   No medications on file  Discontinued Medications   No medications on file     Physical Exam: Vitals:   10/04/15 1116  BP: 120/72  Pulse: 80  Resp: 20  Temp: (!) 95.5  F (35.3 C)  TempSrc: Oral  Weight: 197 lb (89.4 kg)  Height: 5\' 5"  (1.651 m)    Physical Exam  Constitutional: She is oriented to person, place, and time. No distress.  HENT:  Head: Normocephalic and atraumatic.  Neck: No JVD present. No thyromegaly present.  Cardiovascular: Normal rate and regular rhythm.   No murmur heard. No edema  Pulmonary/Chest: Effort normal and breath sounds normal. No respiratory distress.  Abdominal: Soft. Bowel sounds are normal.  Musculoskeletal: She exhibits tenderness (right shoulder with decreased ROM, no swelling). She exhibits no edema.  Neurological: She is oriented to person, place, and time.  Skin: Skin is warm and dry. She is not diaphoretic.  Psychiatric: She has a normal mood and affect.    Labs reviewed: Basic Metabolic Panel:  Recent Labs  16/11/9607/06/16 1809 10/31/14 0515 11/06/14 04/09/15 05/02/15  NA 139 136 136* 138 139  K 3.6 3.7 3.8 4.0 3.8  CL 103 103  --   --   --   CO2 28 23  --   --   --   GLUCOSE 116* 145*  --   --   --   BUN 13 16 16 18 17   CREATININE 0.84 0.79 0.9 0.9 0.9  CALCIUM 10.3 9.7  --   --   --    Liver Function Tests:  Recent Labs  10/31/14 0515 04/09/15  AST 22 13  ALT 22 9  ALKPHOS 69 50  BILITOT 0.7  --   PROT 6.8  --   ALBUMIN 3.6  --    No results for input(s): LIPASE, AMYLASE in the last 8760 hours. No results for input(s): AMMONIA in the last 8760 hours. CBC:  Recent Labs  10/30/14 1809 10/31/14 0515 11/01/14 0520 11/06/14 04/09/15 05/02/15  WBC 7.7 5.6 11.7* 6.5 6.1 6.5  NEUTROABS 4.4  --   --   --   --   --   HGB 13.3 11.5* 10.8* 11.8* 11.0* 10.4*  HCT 39.6 33.9* 32.4* 37 33* 30*  MCV 92.3 90.9 91.5  --   --   --   PLT 211 226 250 222 213 213   TSH:  Recent Labs  11/02/14 1004  TSH 0.958   A1C: No results found for: HGBA1C Lipid Panel: No results for input(s): CHOL, HDL, LDLCALC, TRIG, CHOLHDL, LDLDIRECT in the last 8760 hours.  7.29.16 Right shoulder  xray IMPRESSION 1. No acute findings of the RIGHT shoulder. 2. Severe arthropathy of the RIGHT shoulder.  Assessment/Plan  1. Essential hypertension Stable on valsartan, labs ordered   2. Chronic bronchitis, unspecified chronic bronchitis type (HCC) Stable, without exacerbation Cont current regimen  3. Gastroesophageal reflux disease without esophagitis Controlled on nexium  4. Chronic constipation Well controlled on senokot S  5. Other polyneuropathy (HCC)  Stable on gabapentin  6. Pain in joint of right shoulder -worsening stiffness to right shoulder -staff to use biofreeze TID -OT to evaluate for further treatment.   LABS ordered last month, not in chart- to place in chart or to obtain if not already drawn [place] K. Biagio Borg  [facility] [phone] 8 am - 5 pm) [phone] (after hours)

## 2015-10-08 ENCOUNTER — Other Ambulatory Visit: Payer: Self-pay

## 2015-10-08 MED ORDER — TRAMADOL HCL 50 MG PO TABS: ORAL_TABLET | ORAL | 0 refills | Status: DC

## 2015-10-08 NOTE — Telephone Encounter (Signed)
Prescription request was received from:    [facility] [address] [place] [address]  Phone: [phone] Fax: [phone] 

## 2015-10-11 DIAGNOSIS — M6281 Muscle weakness (generalized): Secondary | ICD-10-CM | POA: Diagnosis not present

## 2015-10-13 LAB — LIPID PANEL
Cholesterol: 182 mg/dL (ref 0–200)
HDL: 64 mg/dL (ref 35–70)
LDL CALC: 102 mg/dL
TRIGLYCERIDES: 81 mg/dL (ref 40–160)

## 2015-10-13 LAB — HEPATIC FUNCTION PANEL
ALK PHOS: 52 U/L (ref 25–125)
ALT: 9 U/L (ref 7–35)
AST: 14 U/L (ref 13–35)
BILIRUBIN, TOTAL: 0.3 mg/dL

## 2015-10-13 LAB — BASIC METABOLIC PANEL
BUN: 14 mg/dL (ref 4–21)
CREATININE: 0.9 mg/dL (ref 0.5–1.1)
Glucose: 92 mg/dL
Potassium: 4 mmol/L (ref 3.4–5.3)
Sodium: 139 mmol/L (ref 137–147)

## 2015-10-13 LAB — CBC AND DIFFERENTIAL
HCT: 32 % — AB (ref 36–46)
HEMOGLOBIN: 10.5 g/dL — AB (ref 12.0–16.0)
NEUTROS ABS: 2976 /uL
PLATELETS: 230 10*3/uL (ref 150–399)
WBC: 6.2 10^3/mL

## 2015-11-01 DIAGNOSIS — Z7901 Long term (current) use of anticoagulants: Secondary | ICD-10-CM | POA: Diagnosis not present

## 2015-11-02 LAB — CBC AND DIFFERENTIAL
HEMATOCRIT: 33 % — AB (ref 36–46)
Hemoglobin: 11 g/dL — AB (ref 12.0–16.0)
PLATELETS: 228 10*3/uL (ref 150–399)
WBC: 6.7 10^3/mL

## 2015-11-02 LAB — BASIC METABOLIC PANEL
BUN: 14 mg/dL (ref 4–21)
Creatinine: 0.8 mg/dL (ref 0.5–1.1)
GLUCOSE: 108 mg/dL
Potassium: 3.8 mmol/L (ref 3.4–5.3)
Sodium: 136 mmol/L — AB (ref 137–147)

## 2015-11-05 ENCOUNTER — Encounter: Payer: Self-pay | Admitting: Internal Medicine

## 2015-11-05 ENCOUNTER — Non-Acute Institutional Stay (SKILLED_NURSING_FACILITY): Payer: Medicare Other | Admitting: Internal Medicine

## 2015-11-05 DIAGNOSIS — K5900 Constipation, unspecified: Secondary | ICD-10-CM | POA: Diagnosis not present

## 2015-11-05 DIAGNOSIS — E041 Nontoxic single thyroid nodule: Secondary | ICD-10-CM

## 2015-11-05 DIAGNOSIS — I10 Essential (primary) hypertension: Secondary | ICD-10-CM | POA: Diagnosis not present

## 2015-11-05 DIAGNOSIS — J42 Unspecified chronic bronchitis: Secondary | ICD-10-CM | POA: Diagnosis not present

## 2015-11-05 DIAGNOSIS — K5909 Other constipation: Secondary | ICD-10-CM

## 2015-11-05 NOTE — Assessment & Plan Note (Signed)
Assess thyroid function 

## 2015-11-05 NOTE — Assessment & Plan Note (Signed)
BMET WNL 10/13/15 Blood pressure adequately controlled on pressure regimen, no change indicated

## 2015-11-05 NOTE — Progress Notes (Signed)
   Facility Location: [facility]  Room Number:202-B  Code Status: Full Code   This is a nursing facility follow up of chronic medical diagnoses  Interim medical record and care since last [facility] visit was updated with review of diagnostic studies and change in clinical status since last visit were documented.  HPI: This is an 82 year old patient diagnoses of COPD with extrinsic asthma component, GERD, peripheral neuropathy and hypertension. She has a history of right thyroid nodule . [place] of thyroid 11/02/14 revealed a complex but predominantly cystic nodule in the right mid gland measuring up to 1.8 cm. Criteria for biopsy were not met. The last TSH on record was 0.958 on 11/02/14 area free T3 was minimally reduced at 1.9 and free T4 was normal. She's had a cholecystectomy  Labs are current. She has a mild , essentially stable anemia with hemoglobin of 10.5  Review of systems: English is her second language, she is from a [place] apparently. This did hinder obtaining an optimal history. She describes intermittent fatigue. Her major symptoms are  pain in her neck, knees, and right shoulder. She states that she has lost some weight. Constipation is a chronic problem. onstitutional: No fever Eyes: No redness, discharge, pain, vision change ENT/mouth: No nasal congestion,  purulent discharge, earache,change in hearing ,sore throat  Cardiovascular: No chest pain, palpitations,paroxysmal nocturnal dyspnea, claudication, edema  Respiratory: No cough, sputum production,hemoptysis, DOE , significant snoring,apnea   Gastrointestinal: No heartburn,dysphagia,abdominal pain, nausea / vomiting,rectal bleeding, melena Genitourinary: No dysuria,hematuria, pyuria,  incontinence, nocturia Dermatologic: No rash, pruritus, change in appearance of skin Neurologic: No dizziness,headache,syncope, seizures, numbness , tingling Psychiatric: No significant  anxiety , depression, insomnia, anorexia Endocrine: No change in hair/skin/ nails, excessive thirst, excessive hunger, excessive urination  Hematologic/lymphatic: No significant bruising, lymphadenopathy,abnormal bleeding Allergy/immunology: No itchy/ watery eyes, significant sneezing, urticaria, angioedema  Physical exam:  Pertinent or positive findings: Arcus senilis is present . Slight lid lag is suggested. She wears only the upper plate. Thyroid is not palpable as it is substernal. Heart sounds are distant. Pedal pulses are decreased. She has clubbing of the nailbeds. There is a slight side-to-side tremor of the left hand.  General appearance:Adequately nourished; no acute distress , increased work of breathing is present.   Lymphatic: No lymphadenopathy about the head, neck, axilla . Eyes: No conjunctival inflammation or lid edema is present. There is no scleral icterus. Ears:  External ear exam shows no significant lesions or deformities.   Nose:  External nasal examination shows no deformity or inflammation. Nasal mucosa are pink and moist without lesions ,exudates Oral exam: lips and gums are healthy appearing.There is no oropharyngeal erythema or exudate . Neck:  No thyromegaly, masses, tenderness noted.    Heart:  Normal rate and regular rhythm. S1 and S2 normal without gallop, murmur, click, rub .  Lungs:Chest clear to auscultation without wheezes, rhonchi,rales , rubs. Abdomen:Bowel sounds are normal. Abdomen is soft and nontender with no organomegaly, hernias,masses. GU: deferred  Extremities:  No cyanosis, clubbing,edema  Neurologic exam : Strength equal  in upper & lower extremities Balance,Rhomberg,finger to nose testing could not be completed due to clinical state Deep tendon reflexes are equal Skin: Warm & dry w/o tenting. No significant lesions or rash.    See summary under each active problem in the Problem List with associated updated therapeutic plan

## 2015-11-05 NOTE — Assessment & Plan Note (Addendum)
11/05/15 clinically stable on present regimen without reactive airways signs, no change indicated

## 2015-11-05 NOTE — Assessment & Plan Note (Signed)
TSH Free T4 FreeT3 US thyroid

## 2015-11-06 DIAGNOSIS — E041 Nontoxic single thyroid nodule: Secondary | ICD-10-CM | POA: Diagnosis not present

## 2015-11-06 DIAGNOSIS — E079 Disorder of thyroid, unspecified: Secondary | ICD-10-CM | POA: Diagnosis not present

## 2015-11-07 LAB — TSH: TSH: 2.02 u[IU]/mL (ref 0.41–5.90)

## 2015-11-18 ENCOUNTER — Other Ambulatory Visit: Payer: Self-pay

## 2015-11-18 ENCOUNTER — Other Ambulatory Visit: Payer: Self-pay | Admitting: *Deleted

## 2015-11-18 MED ORDER — TRAMADOL HCL 50 MG PO TABS: ORAL_TABLET | ORAL | 0 refills | Status: DC

## 2015-11-18 MED ORDER — TRAMADOL HCL 50 MG PO TABS: ORAL_TABLET | ORAL | 5 refills | Status: DC

## 2015-11-18 NOTE — Telephone Encounter (Signed)
Faxed to [facility] Fax Number: [phone], Phone Number [phone]  

## 2015-11-18 NOTE — Telephone Encounter (Signed)
[facility] [phone] Fax: [phone]  

## 2015-11-29 DIAGNOSIS — Z23 Encounter for immunization: Secondary | ICD-10-CM | POA: Diagnosis not present

## 2015-12-06 ENCOUNTER — Non-Acute Institutional Stay (SKILLED_NURSING_FACILITY): Payer: Medicare Other | Admitting: Nurse Practitioner

## 2015-12-06 ENCOUNTER — Encounter: Payer: Self-pay | Admitting: Nurse Practitioner

## 2015-12-06 DIAGNOSIS — I10 Essential (primary) hypertension: Secondary | ICD-10-CM | POA: Diagnosis not present

## 2015-12-06 DIAGNOSIS — E041 Nontoxic single thyroid nodule: Secondary | ICD-10-CM

## 2015-12-06 DIAGNOSIS — J42 Unspecified chronic bronchitis: Secondary | ICD-10-CM | POA: Diagnosis not present

## 2015-12-06 DIAGNOSIS — K5909 Other constipation: Secondary | ICD-10-CM

## 2015-12-06 DIAGNOSIS — M1990 Unspecified osteoarthritis, unspecified site: Secondary | ICD-10-CM

## 2015-12-06 NOTE — Progress Notes (Signed)
Nursing Home Location:  [facility]  Place of Service: SNF (31)  PCP: Kirt Boysarter, Monica, DO  No Known Allergies  Chief Complaint  Patient presents with  . Medical Management of Chronic Issues    Routine Visit    HPI:  Patient is a 82 y.o. female [facility].Pt with hx of COPD, HTN, GERD, PE, constipation.  Reports ongoing constipation today despite medication. Last month US of thyroid was ordered for follow up on mass found 1 year ago, this has not been obtained at this time.   Review of Systems:  Review of Systems  Constitutional: Negative for activity change, appetite change, chills, diaphoresis, fatigue, fever and unexpected weight change.  HENT: Negative for congestion and postnasal drip.   Respiratory: Negative for cough and shortness of breath.   Cardiovascular: Negative for chest pain, palpitations and leg swelling.  Gastrointestinal: Positive for constipation. Negative for abdominal distention and diarrhea.  Genitourinary: Negative for difficulty urinating and dysuria.  Musculoskeletal: Positive for arthralgias and gait problem. Negative for back pain, joint swelling, myalgias, neck pain and neck stiffness.  Neurological: Negative for dizziness, facial asymmetry and speech difficulty.  Psychiatric/Behavioral: Negative for agitation, behavioral problems and confusion.    Past Medical History:  Diagnosis Date  . Arthritis   . Asthma   . COPD (chronic obstructive pulmonary disease) (HCC)   . GERD (gastroesophageal reflux disease)   . Hypertension    Past Surgical History:  Procedure Laterality Date  . CHOLECYSTECTOMY     Social History:   reports that she quit smoking about 44 years ago. She has never used smokeless tobacco. She reports that she does not drink alcohol or use drugs.  Family History  Problem Relation Age of Onset  . Cancer - Other Mother     Died of throat cancer  . Cancer - Prostate Father   .  Diabetes Brother     Medications: Patient's Medications  New Prescriptions   No medications on file  Previous Medications   ACETAMINOPHEN (TYLENOL) 500 MG TABLET    Take 500-1,000 mg by mouth every 6 (six) hours as needed for mild pain or moderate pain.   ALBUTEROL (PROVENTIL HFA;VENTOLIN HFA) 108 (90 BASE) MCG/ACT INHALER    Inhale 2 puffs into the lungs every 6 (six) hours as needed for wheezing or shortness of breath.    AMINO ACIDS-PROTEIN HYDROLYS (FEEDING SUPPLEMENT, PRO-STAT SUGAR FREE 64,) LIQD    Take 30 mLs by mouth daily.   CARBOXYMETHYLCELLULOSE SODIUM 0.25 % SOLN    Place 1 drop into each eye every morning and at daily at bedtime.   ESOMEPRAZOLE (NEXIUM) 40 MG CAPSULE    Take 40 mg by mouth daily at 12 noon.   FLUTICASONE-SALMETEROL (ADVAIR) 500-50 MCG/DOSE AEPB    Inhale 1 puff into the lungs 2 (two) times daily.   GABAPENTIN (NEURONTIN) 100 MG CAPSULE    Take 100 mg by mouth at bedtime.   HYDROCHLOROTHIAZIDE (MICROZIDE) 12.5 MG CAPSULE    Take 12.5 mg by mouth daily.   MECLIZINE (ANTIVERT) 12.5 MG TABLET    Take 12.5 mg by mouth 3 (three) times daily as needed for dizziness.   MENTHOL, TOPICAL ANALGESIC, (BIOFREEZE) 4 % GEL    Apply to neck and right shoulder three times a day for pain.   MONTELUKAST (SINGULAIR) 10 MG TABLET    Take 10 mg by mouth at bedtime.   MULTIPLE VITAMINS-MINERALS (MULTIVITAMIN WITH MINERALS) TABLET  Take 1 tablet by mouth daily.   NITROGLYCERIN (NITROSTAT) 0.4 MG SL TABLET    Place 0.4 mg under the tongue every 5 (five) minutes as needed for chest pain (Do not exceed 3 doses per episode.).   SENNOSIDES-DOCUSATE SODIUM (SENOKOT-S) 8.6-50 MG TABLET    Take 1 tablet by mouth 2 (two) times daily. And at bedtime   TRAMADOL (ULTRAM) 50 MG TABLET    Take one tablet by mouth at bedtime for pain   TRAZODONE (DESYREL) 50 MG TABLET    Take 50 mg by mouth at bedtime.   VALSARTAN (DIOVAN) 80 MG TABLET    Take 80 mg by mouth daily.  Modified Medications   No  medications on file  Discontinued Medications   No medications on file     Physical Exam: Vitals:   12/06/15 1418  BP: 122/78  Pulse: 69  Resp: 20  Temp: (!) 96.5 F (35.8 C)  Weight: 199 lb (90.3 kg)  Height: 5\' 5"  (1.651 m)    Physical Exam  Constitutional: She is oriented to person, place, and time. No distress.  HENT:  Head: Normocephalic and atraumatic.  Neck: No JVD present. No thyromegaly present.  Cardiovascular: Normal rate and regular rhythm.   No murmur heard. No edema  Pulmonary/Chest: Effort normal and breath sounds normal. No respiratory distress.  Abdominal: Soft. Bowel sounds are normal.  Musculoskeletal: She exhibits tenderness (right shoulder with decreased ROM, no swelling). She exhibits no edema.  Neurological: She is oriented to person, place, and time.  Skin: Skin is warm and dry. She is not diaphoretic.  Psychiatric: She has a normal mood and affect.    Labs reviewed: Basic Metabolic Panel:  Recent Labs  81/19/14 10/13/15 11/02/15  NA 137 139 136*  K 3.7 4.0 3.8  BUN 14 14 14   CREATININE 0.8 0.9 0.8   Liver Function Tests:  Recent Labs  04/09/15 09/15/15 10/13/15  AST 13 14 14   ALT 9 8 9   ALKPHOS 50 57 52   No results for input(s): LIPASE, AMYLASE in the last 8760 hours. No results for input(s): AMMONIA in the last 8760 hours. CBC:  Recent Labs  09/15/15 10/13/15 11/02/15  WBC 6.3 6.2 6.7  NEUTROABS  --  2,976  --   HGB 10.1* 10.5* 11.0*  HCT 32* 32* 33*  PLT 222 230 228   TSH:  Recent Labs  11/07/15  TSH 2.02   A1C: No results found for: HGBA1C Lipid Panel:  Recent Labs  09/15/15 10/13/15  CHOL 181 182  HDL 64 64  LDLCALC 100 102  TRIG 84 81    7.29.16 Right shoulder xray IMPRESSION 1. No acute findings of the RIGHT shoulder. 2. Severe arthropathy of the RIGHT shoulder.  Assessment/Plan 1. Right thyroid nodule Reorder for thyroid US to follow up right thyroid nodule  2. Essential hypertension Blood  pressure stable, conts on valsartan  3. Chronic bronchitis, unspecified chronic bronchitis type (HCC) Without recent exacerbation, symptoms well controlled on Singulair, advair and PRN albuterol   4. Chronic constipation Worse at this time. Taking senna S, will add miralax 17 gm daily.  5. Arthritis Pain noted to shoulder and knee. conts on tylenol as neded with biofreeze gel. Taking ultram 50 mg qhs    Bryc[place] Hantz K. Biagio Borg  [facility] [phone] 8 am - 5 pm) [phone] (after hours)

## 2015-12-10 ENCOUNTER — Ambulatory Visit (INDEPENDENT_AMBULATORY_CARE_PROVIDER_SITE_OTHER): Payer: Medicare Other | Admitting: Orthopaedic Surgery

## 2015-12-11 DIAGNOSIS — M6281 Muscle weakness (generalized): Secondary | ICD-10-CM | POA: Diagnosis not present

## 2015-12-11 DIAGNOSIS — M25511 Pain in right shoulder: Secondary | ICD-10-CM | POA: Diagnosis not present

## 2015-12-11 DIAGNOSIS — J449 Chronic obstructive pulmonary disease, unspecified: Secondary | ICD-10-CM | POA: Diagnosis not present

## 2015-12-12 DIAGNOSIS — M6281 Muscle weakness (generalized): Secondary | ICD-10-CM | POA: Diagnosis not present

## 2015-12-12 DIAGNOSIS — M25511 Pain in right shoulder: Secondary | ICD-10-CM | POA: Diagnosis not present

## 2015-12-12 DIAGNOSIS — J449 Chronic obstructive pulmonary disease, unspecified: Secondary | ICD-10-CM | POA: Diagnosis not present

## 2015-12-13 DIAGNOSIS — J449 Chronic obstructive pulmonary disease, unspecified: Secondary | ICD-10-CM | POA: Diagnosis not present

## 2015-12-13 DIAGNOSIS — M25511 Pain in right shoulder: Secondary | ICD-10-CM | POA: Diagnosis not present

## 2015-12-13 DIAGNOSIS — M6281 Muscle weakness (generalized): Secondary | ICD-10-CM | POA: Diagnosis not present

## 2015-12-16 DIAGNOSIS — M6281 Muscle weakness (generalized): Secondary | ICD-10-CM | POA: Diagnosis not present

## 2015-12-16 DIAGNOSIS — M25511 Pain in right shoulder: Secondary | ICD-10-CM | POA: Diagnosis not present

## 2015-12-16 DIAGNOSIS — J449 Chronic obstructive pulmonary disease, unspecified: Secondary | ICD-10-CM | POA: Diagnosis not present

## 2015-12-17 ENCOUNTER — Ambulatory Visit (INDEPENDENT_AMBULATORY_CARE_PROVIDER_SITE_OTHER): Payer: Medicare Other | Admitting: Orthopaedic Surgery

## 2015-12-17 DIAGNOSIS — J449 Chronic obstructive pulmonary disease, unspecified: Secondary | ICD-10-CM | POA: Diagnosis not present

## 2015-12-17 DIAGNOSIS — M25511 Pain in right shoulder: Secondary | ICD-10-CM | POA: Diagnosis not present

## 2015-12-17 DIAGNOSIS — M6281 Muscle weakness (generalized): Secondary | ICD-10-CM | POA: Diagnosis not present

## 2015-12-18 DIAGNOSIS — M6281 Muscle weakness (generalized): Secondary | ICD-10-CM | POA: Diagnosis not present

## 2015-12-18 DIAGNOSIS — J449 Chronic obstructive pulmonary disease, unspecified: Secondary | ICD-10-CM | POA: Diagnosis not present

## 2015-12-18 DIAGNOSIS — M25511 Pain in right shoulder: Secondary | ICD-10-CM | POA: Diagnosis not present

## 2015-12-19 DIAGNOSIS — J449 Chronic obstructive pulmonary disease, unspecified: Secondary | ICD-10-CM | POA: Diagnosis not present

## 2015-12-19 DIAGNOSIS — M25511 Pain in right shoulder: Secondary | ICD-10-CM | POA: Diagnosis not present

## 2015-12-19 DIAGNOSIS — M6281 Muscle weakness (generalized): Secondary | ICD-10-CM | POA: Diagnosis not present

## 2015-12-20 DIAGNOSIS — M25511 Pain in right shoulder: Secondary | ICD-10-CM | POA: Diagnosis not present

## 2015-12-20 DIAGNOSIS — J449 Chronic obstructive pulmonary disease, unspecified: Secondary | ICD-10-CM | POA: Diagnosis not present

## 2015-12-20 DIAGNOSIS — M6281 Muscle weakness (generalized): Secondary | ICD-10-CM | POA: Diagnosis not present

## 2015-12-21 DIAGNOSIS — J449 Chronic obstructive pulmonary disease, unspecified: Secondary | ICD-10-CM | POA: Diagnosis not present

## 2015-12-21 DIAGNOSIS — M6281 Muscle weakness (generalized): Secondary | ICD-10-CM | POA: Diagnosis not present

## 2015-12-21 DIAGNOSIS — M25511 Pain in right shoulder: Secondary | ICD-10-CM | POA: Diagnosis not present

## 2015-12-22 DIAGNOSIS — M25511 Pain in right shoulder: Secondary | ICD-10-CM | POA: Diagnosis not present

## 2015-12-22 DIAGNOSIS — J449 Chronic obstructive pulmonary disease, unspecified: Secondary | ICD-10-CM | POA: Diagnosis not present

## 2015-12-22 DIAGNOSIS — M6281 Muscle weakness (generalized): Secondary | ICD-10-CM | POA: Diagnosis not present

## 2015-12-23 DIAGNOSIS — M25511 Pain in right shoulder: Secondary | ICD-10-CM | POA: Diagnosis not present

## 2015-12-23 DIAGNOSIS — J449 Chronic obstructive pulmonary disease, unspecified: Secondary | ICD-10-CM | POA: Diagnosis not present

## 2015-12-23 DIAGNOSIS — M6281 Muscle weakness (generalized): Secondary | ICD-10-CM | POA: Diagnosis not present

## 2015-12-24 DIAGNOSIS — M25511 Pain in right shoulder: Secondary | ICD-10-CM | POA: Diagnosis not present

## 2015-12-24 DIAGNOSIS — M6281 Muscle weakness (generalized): Secondary | ICD-10-CM | POA: Diagnosis not present

## 2015-12-24 DIAGNOSIS — J449 Chronic obstructive pulmonary disease, unspecified: Secondary | ICD-10-CM | POA: Diagnosis not present

## 2015-12-25 DIAGNOSIS — M25511 Pain in right shoulder: Secondary | ICD-10-CM | POA: Diagnosis not present

## 2015-12-25 DIAGNOSIS — J449 Chronic obstructive pulmonary disease, unspecified: Secondary | ICD-10-CM | POA: Diagnosis not present

## 2015-12-25 DIAGNOSIS — M6281 Muscle weakness (generalized): Secondary | ICD-10-CM | POA: Diagnosis not present

## 2015-12-26 DIAGNOSIS — M6281 Muscle weakness (generalized): Secondary | ICD-10-CM | POA: Diagnosis not present

## 2015-12-26 DIAGNOSIS — M25511 Pain in right shoulder: Secondary | ICD-10-CM | POA: Diagnosis not present

## 2015-12-26 DIAGNOSIS — J449 Chronic obstructive pulmonary disease, unspecified: Secondary | ICD-10-CM | POA: Diagnosis not present

## 2015-12-28 DIAGNOSIS — J449 Chronic obstructive pulmonary disease, unspecified: Secondary | ICD-10-CM | POA: Diagnosis not present

## 2015-12-28 DIAGNOSIS — M25511 Pain in right shoulder: Secondary | ICD-10-CM | POA: Diagnosis not present

## 2015-12-28 DIAGNOSIS — M6281 Muscle weakness (generalized): Secondary | ICD-10-CM | POA: Diagnosis not present

## 2015-12-30 ENCOUNTER — Encounter: Payer: Self-pay | Admitting: Nurse Practitioner

## 2015-12-30 ENCOUNTER — Non-Acute Institutional Stay (SKILLED_NURSING_FACILITY): Payer: Medicare Other | Admitting: Nurse Practitioner

## 2015-12-30 DIAGNOSIS — E041 Nontoxic single thyroid nodule: Secondary | ICD-10-CM

## 2015-12-30 DIAGNOSIS — G6289 Other specified polyneuropathies: Secondary | ICD-10-CM | POA: Diagnosis not present

## 2015-12-30 DIAGNOSIS — M6281 Muscle weakness (generalized): Secondary | ICD-10-CM | POA: Diagnosis not present

## 2015-12-30 DIAGNOSIS — I10 Essential (primary) hypertension: Secondary | ICD-10-CM | POA: Diagnosis not present

## 2015-12-30 DIAGNOSIS — M1990 Unspecified osteoarthritis, unspecified site: Secondary | ICD-10-CM | POA: Diagnosis not present

## 2015-12-30 DIAGNOSIS — K5909 Other constipation: Secondary | ICD-10-CM

## 2015-12-30 DIAGNOSIS — J449 Chronic obstructive pulmonary disease, unspecified: Secondary | ICD-10-CM | POA: Diagnosis not present

## 2015-12-30 DIAGNOSIS — M25511 Pain in right shoulder: Secondary | ICD-10-CM | POA: Diagnosis not present

## 2015-12-30 NOTE — Progress Notes (Signed)
Nursing Home Location:  [facility]  Place of Service: SNF (31)  PCP: Kirt Boysarter, Monica, DO  No Known Allergies  Chief Complaint  Patient presents with  . Medical Management of Chronic Issues    Routine Visit    HPI:  Patient is a 82 y.o. female seen today at [facility] for routine follow up.Pt with hx of COPD, HTN, GERD, PE, constipation.  miralax added last month to regimen and constipation is better controlled Currently pt reports right shoulder pain and next pain.  12/16/15 PT/OT was consulted to evaluate and treat. Pt also has appt scheduled with Dr Ophelia CharterYates tomorrow for further evaluation and treatment Otherwise pt is doing well. Eating well.  Mood is stable Staff has no concerns at this time.   Review of Systems:  Review of Systems  Constitutional: Negative for activity change, appetite change, chills, diaphoresis, fatigue, fever and unexpected weight change.  HENT: Negative for congestion and postnasal drip.   Respiratory: Negative for cough and shortness of breath.   Cardiovascular: Negative for chest pain, palpitations and leg swelling.  Gastrointestinal: Negative for abdominal distention, constipation and diarrhea.  Genitourinary: Negative for difficulty urinating and dysuria.  Musculoskeletal: Positive for arthralgias and gait problem. Negative for back pain, joint swelling, myalgias, neck pain and neck stiffness.       Right shoulder pain  Neurological: Negative for dizziness, facial asymmetry and speech difficulty.  Psychiatric/Behavioral: Negative for agitation, behavioral problems and confusion.    Past Medical History:  Diagnosis Date  . Arthritis   . Asthma   . COPD (chronic obstructive pulmonary disease) (HCC)   . GERD (gastroesophageal reflux disease)   . Hypertension    Past Surgical History:  Procedure Laterality Date  . CHOLECYSTECTOMY     Social History:   reports that she quit smoking about 44 years ago. She has never used  smokeless tobacco. She reports that she does not drink alcohol or use drugs.  Family History  Problem Relation Age of Onset  . Cancer - Other Mother     Died of throat cancer  . Cancer - Prostate Father   . Diabetes Brother     Medications: Patient's Medications  New Prescriptions   No medications on file  Previous Medications   ACETAMINOPHEN (TYLENOL) 500 MG TABLET    Take 500-1,000 mg by mouth every 6 (six) hours as needed for mild pain or moderate pain.   ALBUTEROL (PROVENTIL HFA;VENTOLIN HFA) 108 (90 BASE) MCG/ACT INHALER    Inhale 2 puffs into the lungs every 6 (six) hours as needed for wheezing or shortness of breath.    AMINO ACIDS-PROTEIN HYDROLYS (FEEDING SUPPLEMENT, PRO-STAT SUGAR FREE 64,) LIQD    Take 30 mLs by mouth daily.   CARBOXYMETHYLCELLULOSE SODIUM 0.25 % SOLN    Place 1 drop into each eye every morning and at daily at bedtime.   DEXTROMETHORPHAN-GUAIFENESIN (MUCINEX DM) 30-600 MG 12HR TABLET    Take 1 tablet by mouth 2 (two) times daily.   ESOMEPRAZOLE (NEXIUM) 40 MG CAPSULE    Take 40 mg by mouth daily at 12 noon.   FLUTICASONE-SALMETEROL (ADVAIR) 500-50 MCG/DOSE AEPB    Inhale 1 puff into the lungs 2 (two) times daily.   GABAPENTIN (NEURONTIN) 100 MG CAPSULE    Take 100 mg by mouth at bedtime.   HYDROCHLOROTHIAZIDE (MICROZIDE) 12.5 MG CAPSULE    Take 12.5 mg by mouth daily.   MECLIZINE (ANTIVERT) 12.5 MG TABLET    Take 12.5 mg by  mouth 3 (three) times daily as needed for dizziness.   MENTHOL, TOPICAL ANALGESIC, (BIOFREEZE) 4 % GEL    Apply to neck and right shoulder three times a day for pain.   MONTELUKAST (SINGULAIR) 10 MG TABLET    Take 10 mg by mouth at bedtime.   MULTIPLE VITAMINS-MINERALS (MULTIVITAMIN WITH MINERALS) TABLET    Take 1 tablet by mouth daily.   NITROGLYCERIN (NITROSTAT) 0.4 MG SL TABLET    Place 0.4 mg under the tongue every 5 (five) minutes as needed for chest pain (Do not exceed 3 doses per episode.).   POLYETHYLENE GLYCOL (MIRALAX / GLYCOLAX)  PACKET    Take 17 g by mouth daily.   SENNOSIDES-DOCUSATE SODIUM (SENOKOT-S) 8.6-50 MG TABLET    Take 1 tablet by mouth 2 (two) times daily. And at bedtime   TRAMADOL (ULTRAM) 50 MG TABLET    Take one tablet by mouth at bedtime for pain   TRAZODONE (DESYREL) 50 MG TABLET    Take 50 mg by mouth at bedtime.   VALSARTAN (DIOVAN) 80 MG TABLET    Take 80 mg by mouth daily.  Modified Medications   No medications on file  Discontinued Medications   No medications on file     Physical Exam: Vitals:   12/30/15 1214  BP: 131/72  Pulse: 74  Resp: 18  Temp: 98.5 F (36.9 C)  SpO2: (!) 89%  Weight: 199 lb (90.3 kg)  Height: 5\' 5"  (1.651 m)    Physical Exam  Constitutional: She is oriented to person, place, and time. No distress.  HENT:  Head: Normocephalic and atraumatic.  Neck: No JVD present. No thyromegaly present.  Cardiovascular: Normal rate and regular rhythm.   No murmur heard. No edema  Pulmonary/Chest: Effort normal and breath sounds normal. No respiratory distress.  Abdominal: Soft. Bowel sounds are normal. She exhibits no distension. There is no tenderness.  Musculoskeletal: She exhibits tenderness (right shoulder with decreased ROM, no swelling). She exhibits no edema.  Decrease ROM to right shoulder- can lift to 90 degrees only, full ROM to left   Neurological: She is oriented to person, place, and time.  Skin: Skin is warm and dry. She is not diaphoretic.  Psychiatric: She has a normal mood and affect.    Labs reviewed: Basic Metabolic Panel:  Recent Labs  40/98/1107/23/17 10/13/15 11/02/15  NA 137 139 136*  K 3.7 4.0 3.8  BUN 14 14 14   CREATININE 0.8 0.9 0.8   Liver Function Tests:  Recent Labs  04/09/15 09/15/15 10/13/15  AST 13 14 14   ALT 9 8 9   ALKPHOS 50 57 52   No results for input(s): LIPASE, AMYLASE in the last 8760 hours. No results for input(s): AMMONIA in the last 8760 hours. CBC:  Recent Labs  09/15/15 10/13/15 11/02/15  WBC 6.3 6.2 6.7    NEUTROABS  --  2,976  --   HGB 10.1* 10.5* 11.0*  HCT 32* 32* 33*  PLT 222 230 228   TSH:  Recent Labs  11/07/15  TSH 2.02   A1C: No results found for: HGBA1C Lipid Panel:  Recent Labs  09/15/15 10/13/15  CHOL 181 182  HDL 64 64  LDLCALC 100 102  TRIG 84 81    7.29.16 Right shoulder xray IMPRESSION 1. No acute findings of the RIGHT shoulder. 2. Severe arthropathy of the RIGHT shoulder.  Assessment/Plan 1. Arthritis Worsening right shoulder pain, conts on ultram 50 mg qhs scheduled. Has been evaluated by PT/OT for pain  and decrease ROM. Has orthopedic follow up tomorrow.   2. Essential hypertension Blood pressure remains stable. conts on HCTZ   3. Right thyroid nodule Will reorder right thyroid US, also spoke with staff to ensure this is scheduled   4. Chronic constipation Stable on miralax 17 gm daily with senokot -s  5. Other polyneuropathy (HCC) Remains stable, conts on gabapentin 100 mg qhs  6. GERD Single episode noted overall well controlled on nexium    Lilianna Case K. Biagio Borg  [facility] [phone] 8 am - 5 pm) [phone] (after hours)

## 2015-12-31 ENCOUNTER — Encounter (INDEPENDENT_AMBULATORY_CARE_PROVIDER_SITE_OTHER): Payer: Self-pay | Admitting: Orthopaedic Surgery

## 2015-12-31 ENCOUNTER — Ambulatory Visit (INDEPENDENT_AMBULATORY_CARE_PROVIDER_SITE_OTHER): Payer: Medicare Other | Admitting: Orthopaedic Surgery

## 2015-12-31 VITALS — BP 131/65 | HR 67

## 2015-12-31 DIAGNOSIS — M6281 Muscle weakness (generalized): Secondary | ICD-10-CM | POA: Diagnosis not present

## 2015-12-31 DIAGNOSIS — J449 Chronic obstructive pulmonary disease, unspecified: Secondary | ICD-10-CM | POA: Diagnosis not present

## 2015-12-31 DIAGNOSIS — M19011 Primary osteoarthritis, right shoulder: Secondary | ICD-10-CM | POA: Diagnosis not present

## 2015-12-31 DIAGNOSIS — G8929 Other chronic pain: Secondary | ICD-10-CM

## 2015-12-31 DIAGNOSIS — E041 Nontoxic single thyroid nodule: Secondary | ICD-10-CM | POA: Diagnosis not present

## 2015-12-31 DIAGNOSIS — M25511 Pain in right shoulder: Secondary | ICD-10-CM

## 2015-12-31 MED ORDER — METHYLPREDNISOLONE ACETATE 40 MG/ML IJ SUSP
40.0000 mg | INTRAMUSCULAR | Status: AC | PRN
  Administered 2015-12-31: 40 mg via INTRA_ARTICULAR

## 2015-12-31 MED ORDER — BUPIVACAINE HCL 0.25 % IJ SOLN
4.0000 mL | INTRAMUSCULAR | Status: AC | PRN
Start: 2015-12-31 — End: 2015-12-31
  Administered 2015-12-31: 4 mL via INTRA_ARTICULAR

## 2015-12-31 NOTE — Progress Notes (Signed)
Office Visit Note   Patient: Leslie Farley           Date of Birth: 1930-06-30           MRN: [id number] Visit Date: 12/31/2015 Requested by: Kirt BoysMonica Carter, DO [address] [place], [place] [id number] PCP: Kirt Boysarter, Monica, DO  Subjective: Chief Complaint  Patient presents with  . Right Shoulder - Pain    Patient comes in complaining of right shoulder pain again, flared x 1 month.  The last injection 07/30/15 helped a lot.  She wants another injection today. No new injuries.   Patient states she's had increased pain for the last 2 months she got good relief after the injection and states she like to have her repeat injection. She ambulates with her walker is not a candidate for total shoulder arthroplasty due to the fact she has to use a walker for ambulation. Patient speaks [place] Creole and is here with her daughter. There is x-ray showed end-stage osteoarthritis with sclerosis osteophyte formation flattening of the head right shoulder              Review of Systems  Constitutional: Negative for chills and diaphoresis.  HENT: Negative for ear discharge, ear pain and nosebleeds.        Glasses  Eyes: Negative for discharge and visual disturbance.  Respiratory: Negative for cough, choking and shortness of breath.   Cardiovascular: Negative for chest pain and palpitations.  Gastrointestinal: Negative for abdominal distention and abdominal pain.  Endocrine: Negative for cold intolerance and heat intolerance.  Genitourinary: Negative for flank pain and hematuria.  Skin: Negative for rash and wound.  Neurological: Negative for seizures and speech difficulty.  Hematological: Negative for adenopathy. Does not bruise/bleed easily.  Psychiatric/Behavioral: Negative for agitation and suicidal ideas.   review of systems are updated and unchanged from visit in June.   Assessment & Plan: Visit Diagnoses:  1. Arthritis of right shoulder region   2. Chronic right shoulder pain      Plan: After informed consent right posterior glenohumeral injection performed. She tolerated the injection well. She had excellent relief from previous injection many months ago and can return on a when  necessary basis  Follow-Up Instructions: No Follow-up on file.   Orders:  No orders of the defined types were placed in this encounter.  No orders of the defined types were placed in this encounter.     Procedures: Large Joint Inj Date/Time: 12/31/2015 9:56 AM Performed by: Eldred MangesYATES, Tyyonna Soucy C Authorized by: Eldred MangesYATES, Mahum Betten C   Consent Given by:  Patient Indications:  Pain and joint swelling Location:  Shoulder Site:  R glenohumeral Needle Size:  22 G Approach:  Posterior Ultrasound Guidance: No   Fluoroscopic Guidance: No   Arthrogram: No Medications:  40 mg methylPREDNISolone acetate 40 MG/ML; 4 mL bupivacaine 0.25 % Aspiration Attempted: No       Clinical Data: No additional findings.  Objective: Vital Signs: BP 131/65   Pulse 67   Physical Exam  Constitutional: She is oriented to person, place, and time. She appears well-developed.  HENT:  Head: Normocephalic.  Right Ear: External ear normal.  Left Ear: External ear normal.  Eyes: Pupils are equal, round, and reactive to light.  Neck: No tracheal deviation present. No thyromegaly present.  Cardiovascular: Normal rate.   Pulmonary/Chest: Effort normal.  Abdominal: Soft.  Musculoskeletal:  Decreased right shoulder range of motion with pain. Crepitus was then internal and external rotation. Mild periscapular atrophy. Sensation the  hand is intact  Neurological: She is alert and oriented to person, place, and time.  Skin: Skin is warm and dry.  Psychiatric: She has a normal mood and affect. Her behavior is normal.    Ortho Exam good cervical range of motion positive impingement right shoulder negative left. Crepitus with range of motion. Upper reach full extension cessation and is normal. No swelling of the hands or  digits.  Specialty Comments:  No specialty comments available.  Imaging: No results found.   PMFS History: Patient Active Problem List   Diagnosis Date Noted  . Peripheral neuropathy (HCC) 06/26/2015  . Chronic constipation 05/10/2015  . COPD (chronic obstructive pulmonary disease) (HCC) 03/01/2015  . Acute encephalopathy 11/10/2014  . Right thyroid nodule 11/02/2014  . Hypertension   . GERD (gastroesophageal reflux disease)   . Arthritis    Past Medical History:  Diagnosis Date  . Arthritis   . Asthma   . COPD (chronic obstructive pulmonary disease) (HCC)   . GERD (gastroesophageal reflux disease)   . Hypertension     Family History  Problem Relation Age of Onset  . Cancer - Other Mother     Died of throat cancer  . Cancer - Prostate Father   . Diabetes Brother     Past Surgical History:  Procedure Laterality Date  . CHOLECYSTECTOMY     Social History   Occupational History  . Not on file.   Social History Main Topics  . Smoking status: Former Smoker    Quit date: 02/24/1971  . Smokeless tobacco: Never Used  . Alcohol use No  . Drug use: No  . Sexual activity: Not Currently

## 2016-01-01 DIAGNOSIS — J449 Chronic obstructive pulmonary disease, unspecified: Secondary | ICD-10-CM | POA: Diagnosis not present

## 2016-01-01 DIAGNOSIS — M25511 Pain in right shoulder: Secondary | ICD-10-CM | POA: Diagnosis not present

## 2016-01-01 DIAGNOSIS — M6281 Muscle weakness (generalized): Secondary | ICD-10-CM | POA: Diagnosis not present

## 2016-01-02 DIAGNOSIS — J449 Chronic obstructive pulmonary disease, unspecified: Secondary | ICD-10-CM | POA: Diagnosis not present

## 2016-01-02 DIAGNOSIS — M6281 Muscle weakness (generalized): Secondary | ICD-10-CM | POA: Diagnosis not present

## 2016-01-02 DIAGNOSIS — M25511 Pain in right shoulder: Secondary | ICD-10-CM | POA: Diagnosis not present

## 2016-01-03 DIAGNOSIS — J449 Chronic obstructive pulmonary disease, unspecified: Secondary | ICD-10-CM | POA: Diagnosis not present

## 2016-01-03 DIAGNOSIS — M25511 Pain in right shoulder: Secondary | ICD-10-CM | POA: Diagnosis not present

## 2016-01-03 DIAGNOSIS — M6281 Muscle weakness (generalized): Secondary | ICD-10-CM | POA: Diagnosis not present

## 2016-01-09 DIAGNOSIS — G4700 Insomnia, unspecified: Secondary | ICD-10-CM | POA: Diagnosis not present

## 2016-01-09 DIAGNOSIS — F329 Major depressive disorder, single episode, unspecified: Secondary | ICD-10-CM | POA: Diagnosis not present

## 2016-01-14 ENCOUNTER — Encounter: Payer: Self-pay | Admitting: Internal Medicine

## 2016-01-14 ENCOUNTER — Non-Acute Institutional Stay (SKILLED_NURSING_FACILITY): Payer: Medicare Other | Admitting: Internal Medicine

## 2016-01-14 DIAGNOSIS — E041 Nontoxic single thyroid nodule: Secondary | ICD-10-CM | POA: Diagnosis not present

## 2016-01-14 DIAGNOSIS — Z86711 Personal history of pulmonary embolism: Secondary | ICD-10-CM

## 2016-01-14 DIAGNOSIS — J410 Simple chronic bronchitis: Secondary | ICD-10-CM

## 2016-01-14 DIAGNOSIS — I10 Essential (primary) hypertension: Secondary | ICD-10-CM

## 2016-01-14 NOTE — Patient Instructions (Addendum)
[facility]. Transportation to Endocrinology consultation ordered 11/8 should be arranged [facility]

## 2016-01-14 NOTE — Assessment & Plan Note (Addendum)
Endocrinology consultation pending

## 2016-01-14 NOTE — Assessment & Plan Note (Signed)
She has completed 6 months of oral anticoagulant Eliquis

## 2016-01-14 NOTE — Assessment & Plan Note (Signed)
BP controlled; no change in antihypertensive medications  

## 2016-01-14 NOTE — Progress Notes (Signed)
Nursing Home Location: [facility] Room Number: [address]  Leslie MelnickWilliam Mariangel Ringley, MD [address] [place] [id number]   Code Status: Full Code   The patient is being discharged from [facility] on this date by Leslie MelnickWilliam Gearald Stonebraker MD.   The medical history in this facility was reviewed and summarized and medical problem list was updated. Time spent and note content is documented as follows.  Summary of [facility] medical records: [facility]. She been hospitalized 9/6-9/9 for progressive shortness of breath from  acute pulmonary embolism. This was in context of mildly displaced oblique fracture through the distal fibula right ankle sustained in a mechanical fall approximate 5 weeks prior to admission associated with immobility. Doppler did not demonstrate a DVT; initially she received heparin but was transitioned to  Eliquis for a total of 6 months. The partially displaced right fibular fracture had been treated conservatively. This was followed as an outpatient by Dr. Devonne DoughtyNoris, Orthopedist with plaster splinting in neutral position. Hospital course was completed by acute encephalopathy attributed to steroids  prescribed for reactive airway component to her COPD. Additionally follow-up of right lobe thyroid mass seen on CT of the head 10/01/14 was recommended. Ultrasound 11/02/14 revealed a complex, predominantly cystic nodule in the right mid gland measuring up to 1.8 cm. Findings did not meet consensus criteria for biopsy. She was transferred to the SNF for PT/OT.  Medical history comorbidities include COPD, asthma, hypertension, GERD, and degenerative joint disease. TSH on 11/02/14 revealed a value of 0.958. Free T3 was 1.9 and free T4 1.10. Follow-up 11/07/15 revealed a TSH of 2.02. She was seen by Dr. Oren BinetYates/7/17 for chronic right shoulder pain due to arthritis. She received a right posterior glenohumeral  injection.   Review of systems:Pertinent or active symptoms include: ongoing pain in the right shoulder despite the recent shoulder injection. She also  describes pain and swelling in her knees. She has an intermittent cough with some white sputum in the context of her asthmatic bronchitis. She denies other active cardiopulmonary symptoms. She has constipation. Review of systems is complicated to some extent by a language barrier and comprehension. Negative BJY:[id number]:Constitutional: No fever,significant weight change, fatigue  Eyes: No redness, discharge, pain, vision change ENT/mouth: No nasal congestion,  purulent discharge, earache,change in hearing ,sore throat  Cardiovascular: No chest pain, palpitations,paroxysmal nocturnal dyspnea, claudication, edema  Respiratory: No hemoptysis, DOE , significant snoring,apnea   Gastrointestinal: No heartburn,dysphagia,abdominal pain, nausea / vomiting,rectal bleeding, melena Genitourinary: No dysuria,hematuria, pyuria,  incontinence, nocturia Dermatologic: No rash, pruritus, change in appearance of skin Neurologic: No dizziness,headache,syncope, seizures, numbness , tingling Psychiatric: No significant anxiety , depression, insomnia, anorexia Endocrine: No change in hair/skin/ nails, excessive thirst, excessive hunger, excessive urination  Hematologic/lymphatic: No significant bruising, lymphadenopathy,abnormal bleeding Allergy/immunology: No itchy/ watery eyes, significant sneezing, urticaria, angioedema  Physical exam:  Pertinent or positive findings: She is in a wheelchair because of the degenerative joint disease of the knees. Arcus senilis is present. Present are complete dentures. Breath sounds are markedly decreased. She has an intermittent minor nonproductive cough. Heart sounds are distant with a scratchy systolic murmur. She has trace edema at the ankles. Clubbing of the nailbeds is present. Fusiform enlargement of the knees with crepitus is  present. She has linear keloids in the epigastric area.   General appearance:Adequately nourished; no acute distress , increased work of breathing is present.   Lymphatic: No lymphadenopathy about the head, neck, axilla .  Eyes: No conjunctival inflammation or lid edema is present. There is no scleral icterus. Ears:  External ear exam shows no significant lesions or deformities.   Nose:  External nasal examination shows no deformity or inflammation. Nasal mucosa are pink and moist without lesions ,exudates Oral exam: lips and gums are healthy appearing.There is no oropharyngeal erythema or exudate . Neck:  No thyromegaly, masses, tenderness noted.    Heart:  Normal rate and regular rhythm. S1 and S2 normal without gallop, click, rub .  Lungs:Chest clear to auscultation without wheezes, rhonchi,rales , rubs. Abdomen:Bowel sounds are normal. Abdomen is soft and nontender with no organomegaly, hernias,masses. GU: deferred Extremities:  No cyanosis Neurologic exam : Strength equal  in upper & lower extremities but decreeased Balance,Rhomberg,finger to nose testing could not be completed due to clinical state Skin: Warm & dry w/o tenting. No significant  rash.  See clinical summary of Discharge Diagnoses in the Problem List with associated updated therapeutic plan  Discharge instructions were written and discharge instructions provided. Follow-up will be by the primary care physician in seven - 14 days.

## 2016-01-14 NOTE — Assessment & Plan Note (Signed)
Continued pulmonary toilet as ordered

## 2016-01-15 ENCOUNTER — Encounter: Payer: Self-pay | Admitting: Adult Health

## 2016-01-15 ENCOUNTER — Non-Acute Institutional Stay (SKILLED_NURSING_FACILITY): Payer: Medicare Other | Admitting: Adult Health

## 2016-01-15 DIAGNOSIS — J42 Unspecified chronic bronchitis: Secondary | ICD-10-CM

## 2016-01-15 DIAGNOSIS — M1990 Unspecified osteoarthritis, unspecified site: Secondary | ICD-10-CM

## 2016-01-15 DIAGNOSIS — K219 Gastro-esophageal reflux disease without esophagitis: Secondary | ICD-10-CM

## 2016-01-15 DIAGNOSIS — I25708 Atherosclerosis of coronary artery bypass graft(s), unspecified, with other forms of angina pectoris: Secondary | ICD-10-CM | POA: Diagnosis not present

## 2016-01-15 DIAGNOSIS — K5900 Constipation, unspecified: Secondary | ICD-10-CM | POA: Insufficient documentation

## 2016-01-15 DIAGNOSIS — R2689 Other abnormalities of gait and mobility: Secondary | ICD-10-CM | POA: Diagnosis not present

## 2016-01-15 DIAGNOSIS — G4700 Insomnia, unspecified: Secondary | ICD-10-CM | POA: Diagnosis not present

## 2016-01-15 DIAGNOSIS — I10 Essential (primary) hypertension: Secondary | ICD-10-CM

## 2016-01-15 DIAGNOSIS — K5901 Slow transit constipation: Secondary | ICD-10-CM

## 2016-01-15 DIAGNOSIS — H8110 Benign paroxysmal vertigo, unspecified ear: Secondary | ICD-10-CM

## 2016-01-15 DIAGNOSIS — G629 Polyneuropathy, unspecified: Secondary | ICD-10-CM | POA: Diagnosis not present

## 2016-01-15 DIAGNOSIS — I25810 Atherosclerosis of coronary artery bypass graft(s) without angina pectoris: Secondary | ICD-10-CM | POA: Insufficient documentation

## 2016-01-15 DIAGNOSIS — I209 Angina pectoris, unspecified: Secondary | ICD-10-CM

## 2016-01-15 DIAGNOSIS — M6281 Muscle weakness (generalized): Secondary | ICD-10-CM | POA: Diagnosis not present

## 2016-01-15 DIAGNOSIS — R278 Other lack of coordination: Secondary | ICD-10-CM | POA: Diagnosis not present

## 2016-01-15 NOTE — Progress Notes (Signed)
DATE:  01/15/2016   MRN:  [id number]  BIRTHDAY: Jul 19, 1930  Facility:  Nursing Home Location:  [facility]  Nursing Home Room Number: 804-B  LEVEL OF CARE:  SNF [phone])  Contact Information    Name Relation Home Work TaylorMobile   Guillaume,Nathalie Daughter [phone]  [phone]   Lane HackerGuillaume,Kendyl Daughter [phone]  [phone]       Code Status History    Date Active Date Inactive Code Status Order ID Comments User Context   10/30/2014  9:41 PM 11/02/2014 10:05 PM Partial Code [id number]  Lorretta HarpXilin Niu, MD Inpatient    Questions for Most Recent Historical Code Status (Order [id number])    Question Answer Comment   In the event of cardiac or respiratory ARREST: Initiate Code Blue, Call Rapid Response Yes    In the event of cardiac or respiratory ARREST: Perform CPR Yes    In the event of cardiac or respiratory ARREST: Perform Intubation/Mechanical Ventilation No    In the event of cardiac or respiratory ARREST: Use NIPPV/BiPAp only if indicated Yes    In the event of cardiac or respiratory ARREST: Administer ACLS medications if indicated Yes    In the event of cardiac or respiratory ARREST: Perform Defibrillation or Cardioversion if indicated Yes        Chief Complaint  Patient presents with  . Acute Visit    HISTORY OF PRESENT ILLNESS:  This is an 82 year old Female who has [facility]. She has been admitted for long-term care. She has PMH of COPD, hypertension, GERD, PE and constipation.  She was seen in her room today and complained of constipation. She demonstrated that her right shoulder has limited ROM due to arthritic pain. She said [place]   PAST MEDICAL HISTORY:  Past Medical History:  Diagnosis Date  . Arthritis   . Asthma   . COPD (chronic obstructive pulmonary disease) (HCC)   . GERD (gastroesophageal reflux disease)   . Hypertension      CURRENT  MEDICATIONS: Reviewed  Patient's Medications  New Prescriptions   No medications on file  Previous Medications   ACETAMINOPHEN (TYLENOL) 500 MG TABLET    Take 1,000 mg by mouth every 6 (six) hours as needed for mild pain or moderate pain.    ALBUTEROL (PROVENTIL HFA;VENTOLIN HFA) 108 (90 BASE) MCG/ACT INHALER    Inhale 2 puffs into the lungs every 6 (six) hours as needed for wheezing or shortness of breath.    AMINO ACIDS-PROTEIN HYDROLYS (FEEDING SUPPLEMENT, PRO-STAT SUGAR FREE 64,) LIQD    Take 30 mLs by mouth daily.   CARBOXYMETHYLCELLULOSE SODIUM 0.25 % SOLN    Place 1 drop into each eye every morning and at daily at bedtime.    DEXTROMETHORPHAN-GUAIFENESIN (MUCINEX DM) 30-600 MG 12HR TABLET    Take 1 tablet by mouth 2 (two) times daily.   ESOMEPRAZOLE (NEXIUM) 40 MG CAPSULE    Take 40 mg by mouth daily at 12 noon.    FLUTICASONE-SALMETEROL (ADVAIR) 500-50 MCG/DOSE AEPB    Inhale 1 puff into the lungs 2 (two) times daily.   GABAPENTIN (NEURONTIN) 100 MG CAPSULE    Take 100 mg by mouth at bedtime.   HYDROCHLOROTHIAZIDE (MICROZIDE) 12.5 MG CAPSULE    Take 12.5 mg by mouth daily.    MECLIZINE (ANTIVERT) 12.5 MG TABLET    Take 12.5 mg by mouth 3 (three) times daily as needed for dizziness.  MENTHOL, TOPICAL ANALGESIC, (BIOFREEZE) 4 % GEL    Apply to neck and right shoulder three times a day for pain.   MONTELUKAST (SINGULAIR) 10 MG TABLET    Take 10 mg by mouth at bedtime.   MULTIPLE VITAMINS-MINERALS (MULTIVITAMIN WITH MINERALS) TABLET    Take 1 tablet by mouth daily.   NITROGLYCERIN (NITROSTAT) 0.4 MG SL TABLET    Place 0.4 mg under the tongue every 5 (five) minutes as needed for chest pain (Do not exceed 3 doses per episode.).   POLYETHYLENE GLYCOL (MIRALAX / GLYCOLAX) PACKET    Take 17 g by mouth daily.   SENNOSIDES-DOCUSATE SODIUM (SENOKOT-S) 8.6-50 MG TABLET    Take 1 tablet by mouth 2 (two) times daily.    TRAMADOL (ULTRAM) 50 MG TABLET    Take one tablet by mouth at bedtime for pain    TRAMADOL (ULTRAM) 50 MG TABLET    Take by mouth every 6 (six) hours as needed.   TRAZODONE (DESYREL) 150 MG TABLET    Take 75 mg by mouth at bedtime. Take 1/2 tablet to = 75 mg qhs   VALSARTAN (DIOVAN) 80 MG TABLET    Take 80 mg by mouth daily.   Modified Medications   No medications on file  Discontinued Medications   TRAZODONE (DESYREL) 50 MG TABLET    Take 50 mg by mouth at bedtime.     No Known Allergies   REVIEW OF SYSTEMS:  GENERAL: no change in appetite, no fatigue, no weight changes, no fever, chills or weakness EYES: Denies change in vision, dry eyes, eye pain, itching or discharge EARS: Denies change in hearing, ringing in ears, or earache NOSE: Denies nasal congestion or epistaxis MOUTH and THROAT: Denies oral discomfort, gingival pain or bleeding, pain from teeth or hoarseness   RESPIRATORY: no cough, SOB, DOE, wheezing, hemoptysis CARDIAC: no chest pain, edema or palpitations GI: no abdominal pain, diarrhea, heart burn, nausea or vomiting, +constipation GU: Denies dysuria, frequency, hematuria, incontinence, or discharge PSYCHIATRIC: Denies feeling of depression or anxiety. No report of hallucinations, insomnia, paranoia, or agitation    PHYSICAL EXAMINATION  GENERAL APPEARANCE: Well nourished. In no acute distress. Obese SKIN:  Skin is warm and dry.  HEAD: Normal in size and contour. No evidence of trauma EYES: Lids open and close normally. No blepharitis, entropion or ectropion. PERRL. Conjunctivae are clear and sclerae are white. Lenses are without opacity EARS: Pinnae are normal. Patient hears normal voice tunes of the examiner MOUTH and THROAT: Lips are without lesions. Oral mucosa is moist and without lesions. Tongue is normal in shape, size, and color and without lesions NECK: supple, trachea midline, no neck masses, no thyroid tenderness, no thyromegaly LYMPHATICS: no LAN in the neck, no supraclavicular LAN RESPIRATORY: breathing is even & unlabored, BS  CTAB CARDIAC: RRR, no murmur,no extra heart sounds, no edema GI: abdomen soft, normal BS, no masses, no tenderness, no hepatomegaly, no splenomegaly EXTREMITIES:  Able to move 4 extremities; BLE with generalized weakness PSYCHIATRIC: Alert and oriented X 3. Affect and behavior are appropriate  LABS/RADIOLOGY: Labs reviewed: Basic Metabolic Panel:  Recent Labs  40/98/11 10/13/15 11/02/15  NA 137 139 136*  K 3.7 4.0 3.8  BUN 14 14 14   CREATININE 0.8 0.9 0.8   Liver Function Tests:  Recent Labs  04/09/15 09/15/15 10/13/15  AST 13 14 14   ALT 9 8 9   ALKPHOS 50 57 52    CBC:  Recent Labs  09/15/15 10/13/15 11/02/15  WBC 6.3  6.2 6.7  NEUTROABS  --  2,976  --   HGB 10.1* 10.5* 11.0*  HCT 32* 32* 33*  PLT 222 230 228   Lipid Panel:  Recent Labs  09/15/15 10/13/15  HDL 64 64      ASSESSMENT/PLAN:  Arthritis - right shoulder,neck  and bilateral knees has arthritic pains occasionally; continue Biofreeze 4% gel 3 times a day, tramadol 50 mg 1 tab by mouth daily at bedtime, tramadol 50 mg 1 tab by mouth every 6 hours when necessary  Chronic bronchitis - no wheezing nor SOB; continue Symbicort AER 160-4.5 2 puffs by mouth twice a day, Singulair 10 mg 1 tab by mouth daily at bedtime, Proventil HFA 90 g inhaler 2 puffs in 2 lungs every 6 hours when necessary and start Mucinex DM 600-30 mg 1 tab by mouth every 12 hours  Hypertension - continue HCTZ 12.5 mg 1 tab by mouth daily and losartan 50 mg 1 tab by mouth daily; check CMP  Neuropathy - continue gabapentin 100 mg 1 capsule by mouth daily at bedtime  Insomnia - continue trazodone 150 mg 1/2 tab = 75 mg by mouth daily at bedtime  GERD - continue Nexium 40 mg 1 capsule by mouth daily @ noon; check CBC  Vertigo - continue meclizine 12.5 mg 1 caplet mouth 3 times a day  CAD - stable; continue Nitrostat 0.4 mg 1 tablet sublingual every 5 minutes when necessary max of 3 doses  Constipation - increase senna S to 2 tabs by  mouth twice a day and MiraLAX 17 g by mouth twice a day    Goals of care:  Short-term rehabilitation     Kenard GowerMonina Medina-Vargas - NP [facility] [phone]

## 2016-01-16 DIAGNOSIS — R278 Other lack of coordination: Secondary | ICD-10-CM | POA: Diagnosis not present

## 2016-01-16 DIAGNOSIS — M6281 Muscle weakness (generalized): Secondary | ICD-10-CM | POA: Diagnosis not present

## 2016-01-16 DIAGNOSIS — R2689 Other abnormalities of gait and mobility: Secondary | ICD-10-CM | POA: Diagnosis not present

## 2016-01-20 DIAGNOSIS — R278 Other lack of coordination: Secondary | ICD-10-CM | POA: Diagnosis not present

## 2016-01-20 DIAGNOSIS — M6281 Muscle weakness (generalized): Secondary | ICD-10-CM | POA: Diagnosis not present

## 2016-01-20 DIAGNOSIS — R2689 Other abnormalities of gait and mobility: Secondary | ICD-10-CM | POA: Diagnosis not present

## 2016-01-20 LAB — HEPATIC FUNCTION PANEL
ALK PHOS: 64 U/L (ref 25–125)
ALT: 7 U/L (ref 7–35)
AST: 12 U/L — AB (ref 13–35)
Bilirubin, Total: 0.4 mg/dL

## 2016-01-20 LAB — BASIC METABOLIC PANEL
BUN: 14 mg/dL (ref 4–21)
Creatinine: 0.8 mg/dL (ref 0.5–1.1)
GLUCOSE: 87 mg/dL
POTASSIUM: 4.2 mmol/L (ref 3.4–5.3)
SODIUM: 141 mmol/L (ref 137–147)

## 2016-01-20 LAB — CBC AND DIFFERENTIAL
HCT: 32 % — AB (ref 36–46)
HEMOGLOBIN: 10.8 g/dL — AB (ref 12.0–16.0)
Platelets: 216 10*3/uL (ref 150–399)
WBC: 6.3 10^3/mL

## 2016-01-21 ENCOUNTER — Encounter: Payer: Self-pay | Admitting: Internal Medicine

## 2016-01-21 ENCOUNTER — Non-Acute Institutional Stay (SKILLED_NURSING_FACILITY): Payer: Medicare Other | Admitting: Internal Medicine

## 2016-01-21 DIAGNOSIS — K219 Gastro-esophageal reflux disease without esophagitis: Secondary | ICD-10-CM

## 2016-01-21 DIAGNOSIS — M159 Polyosteoarthritis, unspecified: Secondary | ICD-10-CM | POA: Diagnosis not present

## 2016-01-21 DIAGNOSIS — I25708 Atherosclerosis of coronary artery bypass graft(s), unspecified, with other forms of angina pectoris: Secondary | ICD-10-CM

## 2016-01-21 DIAGNOSIS — G4709 Other insomnia: Secondary | ICD-10-CM

## 2016-01-21 DIAGNOSIS — I209 Angina pectoris, unspecified: Secondary | ICD-10-CM

## 2016-01-21 DIAGNOSIS — R278 Other lack of coordination: Secondary | ICD-10-CM | POA: Diagnosis not present

## 2016-01-21 DIAGNOSIS — M792 Neuralgia and neuritis, unspecified: Secondary | ICD-10-CM

## 2016-01-21 DIAGNOSIS — H8110 Benign paroxysmal vertigo, unspecified ear: Secondary | ICD-10-CM

## 2016-01-21 DIAGNOSIS — I10 Essential (primary) hypertension: Secondary | ICD-10-CM

## 2016-01-21 DIAGNOSIS — M6281 Muscle weakness (generalized): Secondary | ICD-10-CM | POA: Diagnosis not present

## 2016-01-21 DIAGNOSIS — K5909 Other constipation: Secondary | ICD-10-CM

## 2016-01-21 DIAGNOSIS — R2689 Other abnormalities of gait and mobility: Secondary | ICD-10-CM | POA: Diagnosis not present

## 2016-01-21 DIAGNOSIS — J410 Simple chronic bronchitis: Secondary | ICD-10-CM | POA: Diagnosis not present

## 2016-01-21 NOTE — Progress Notes (Signed)
LOCATION: [facility]  PCP: Marga MelnickWilliam Hopper, MD   Code Status: Full Code  Goals of care: Advanced Directive information Advanced Directives 01/14/2016  Does Patient Have a Medical Advance Directive? No  Would patient like information on creating a medical advance directive? -       Extended Emergency Contact Information Primary Emergency Contact: Guillaume,Nathalie Address: [address]          [place], [place] [address] [place] Home Phone: [phone] Mobile Phone: [phone] Relation: Daughter Secondary Emergency Contact: Cammie McgeeGuillaume,Winry          [place], [place] Home Phone: [phone] Mobile Phone: [phone] Relation: Daughter   No Known Allergies  Chief Complaint  Patient presents with  . New Admit To SNF    New Admission Visit     HPI:  Patient is a 82 y.o. female seen today for long term care from another facility. She is wheelchair bound and gets out of bed daily. She feeds herself. She needs assistance with her other ADLS. She is complaint with her medications.   Review of Systems:  Constitutional: Negative for fever, chills, diaphoresis.  HENT: Negative for headache, congestion, nasal discharge, sore throat, difficulty swallowing.   Eyes: Negative for blurred vision, double vision and discharge.  Respiratory: Negative for shortness of breath and wheezing. Positive for cough.  Cardiovascular: Negative for chest pain, palpitations, leg swelling.  Gastrointestinal: Negative for heartburn, nausea, vomiting, abdominal pain. She has chronic constipation. Her last bowel movement was 4 days back.  Genitourinary: Negative for dysuria and flank pain.  Musculoskeletal: Negative for back pain, fall. positive for chronic right shoulder pain.  Skin: Negative for itching, rash.  Neurological: Negative for dizziness. Psychiatric/Behavioral: Negative for depression.   Past Medical History:  Diagnosis Date  .  Arthritis   . Asthma   . COPD (chronic obstructive pulmonary disease) (HCC)   . GERD (gastroesophageal reflux disease)   . Hypertension    Past Surgical History:  Procedure Laterality Date  . CHOLECYSTECTOMY     Social History:   reports that she quit smoking about 44 years ago. She has never used smokeless tobacco. She reports that she does not drink alcohol or use drugs.  Family History  Problem Relation Age of Onset  . Cancer - Other Mother     Died of throat cancer  . Cancer - Prostate Father   . Diabetes Brother     Medications:   Medication List       Accurate as of 01/21/16  3:32 PM. Always use your most recent med list.          acetaminophen 500 MG tablet Commonly known as:  TYLENOL Take 1,000 mg by mouth every 6 (six) hours as needed for mild pain or moderate pain.   albuterol 108 (90 Base) MCG/ACT inhaler Commonly known as:  PROVENTIL HFA;VENTOLIN HFA Inhale 2 puffs into the lungs every 6 (six) hours as needed for wheezing or shortness of breath.   BIOFREEZE 4 % Gel Generic drug:  Menthol (Topical Analgesic) Apply to neck and right shoulder three times a day for pain.   budesonide-formoterol 160-4.5 MCG/ACT inhaler Commonly known as:  SYMBICORT Inhale 2 puffs into the lungs 2 (two) times daily.   Carboxymethylcellulose Sodium 0.25 % Soln Place 1 drop into each eye every morning.   dextromethorphan-guaiFENesin 30-600 MG 12hr tablet Commonly known as:  MUCINEX DM Take 1 tablet by mouth 2 (two) times daily.  esomeprazole 40 MG capsule Commonly known as:  NEXIUM Take 40 mg by mouth daily at 12 noon.   feeding supplement (PRO-STAT SUGAR FREE 64) Liqd Take 30 mLs by mouth daily.   gabapentin 100 MG capsule Commonly known as:  NEURONTIN Take 100 mg by mouth at bedtime.   hydrochlorothiazide 12.5 MG capsule Commonly known as:  MICROZIDE Take 12.5 mg by mouth daily.   losartan 50 MG tablet Commonly known as:  COZAAR Take 50 mg by mouth  daily.   meclizine 12.5 MG tablet Commonly known as:  ANTIVERT Take 12.5 mg by mouth 3 (three) times daily as needed for dizziness.   montelukast 10 MG tablet Commonly known as:  SINGULAIR Take 10 mg by mouth at bedtime.   multivitamin with minerals tablet Take 1 tablet by mouth daily.   nitroGLYCERIN 0.4 MG SL tablet Commonly known as:  NITROSTAT Place 0.4 mg under the tongue every 5 (five) minutes as needed for chest pain (Do not exceed 3 doses per episode.).   polyethylene glycol packet Commonly known as:  MIRALAX / GLYCOLAX Take 17 g by mouth daily.   sennosides-docusate sodium 8.6-50 MG tablet Commonly known as:  SENOKOT-S Take 2 tablets by mouth 2 (two) times daily.   traMADol 50 MG tablet Commonly known as:  ULTRAM Take by mouth every 6 (six) hours as needed.   traMADol 50 MG tablet Commonly known as:  ULTRAM Take one tablet by mouth at bedtime for pain   traZODone 150 MG tablet Commonly known as:  DESYREL Take 75 mg by mouth at bedtime. Take 1/2 tablet to = 75 mg qhs       Immunizations: Immunization History  Administered Date(s) Administered  . Influenza,inj,Quad PF,36+ Mos 10/31/2014  . Influenza-Unspecified 11/29/2015  . PPD Test 11/02/2014, 01/14/2016     Physical Exam:  Vitals:   01/21/16 1521  BP: (!) 132/54  Pulse: 66  Resp: 20  Temp: 98.6 F (37 C)  TempSrc: Oral  SpO2: 97%  Weight: 198 lb (89.8 kg)  Height: 5\' 5"  (1.651 m)   Body mass index is 32.95 kg/m.  General- elderly female, obese, in no acute distress Head- normocephalic, atraumatic Nose- no maxillary or frontal sinus tenderness, no nasal discharge Throat- moist mucus membrane Eyes- PERRLA, EOMI, no pallor, no icterus, no discharge, normal conjunctiva, normal sclera Neck- no cervical lymphadenopathy Cardiovascular- normal s1,s2, no murmur, trace leg edema Respiratory- bilateral clear to auscultation, no wheeze, no rhonchi, no crackles, no use of accessory  muscles Abdomen- bowel sounds present, soft, non tender, no guarding or rigidity, no CVA tenderness Musculoskeletal- able to move all 4 extremities, on wheelchair, RLE > LLE weakness, limited right shoulder ROM Neurological- alert and oriented to person, place and time Skin- warm and dry Psychiatry- normal mood and affect    Labs reviewed: Basic Metabolic Panel:  Recent Labs  16/10/96 11/02/15 01/20/16  NA 139 136* 141  K 4.0 3.8 4.2  BUN 14 14 14   CREATININE 0.9 0.8 0.8   Liver Function Tests:  Recent Labs  09/15/15 10/13/15 01/20/16  AST 14 14 12*  ALT 8 9 7   ALKPHOS 57 52 64   No results for input(s): LIPASE, AMYLASE in the last 8760 hours. No results for input(s): AMMONIA in the last 8760 hours. CBC:  Recent Labs  10/13/15 11/02/15 01/20/16  WBC 6.2 6.7 6.3  NEUTROABS 2,976  --   --   HGB 10.5* 11.0* 10.8*  HCT 32* 33* 32*  PLT 230 228  216   Cardiac Enzymes: No results for input(s): CKTOTAL, CKMB, CKMBINDEX, TROPONINI in the last 8760 hours. BNP: Invalid input(s): POCBNP CBG: No results for input(s): GLUCAP in the last 8760 hours.  Radiological Exams: No results found.   Assessment/Plan  Chronic bronchitis  With chronic cough. Continue mucinex and singulair. Continue symbicort and proventil current regimen. Breathing stable  CAD Stable, chest pain free. Continue losartan and prn NTG. Monitor  Generalized OA Continue tramadol 50 mg qhs and then q6h prn pain. Continue biofreeze gel. Monitor  Chronic constipation On senna s 2 tab bid and miralax bid. Hydration to be maintained. Monitor.   Neuropathic pain Continue gabapentin 100 mg qhs and monitor  Insomnia On trazodone, no changes made  gerd Stable, continue nexium 40 mg daily  HTN continue losartan 50 mg daily and HCTZ 12.5 mg daily, monitor bp and bmp  Vertigo Stable on meclizine 12.5 mg tid, monitor, slow position change encouraged   Goals of care: long term care   Labs/tests  ordered: cbc cmp  Family/ staff Communication: reviewed care plan with patient and nursing supervisor    Oneal GroutMAHIMA Laneta Guerin, MD Internal Medicine [facility] [address] [place], [place] [address] Cell Phone (Monday-Friday 8 am - 5 pm): [phone] On Call: [phone] and follow prompts after 5 pm and on weekends Office Phone: [phone] Office Fax: [phone]

## 2016-01-22 DIAGNOSIS — R278 Other lack of coordination: Secondary | ICD-10-CM | POA: Diagnosis not present

## 2016-01-22 DIAGNOSIS — R2689 Other abnormalities of gait and mobility: Secondary | ICD-10-CM | POA: Diagnosis not present

## 2016-01-22 DIAGNOSIS — M6281 Muscle weakness (generalized): Secondary | ICD-10-CM | POA: Diagnosis not present

## 2016-01-23 DIAGNOSIS — R2689 Other abnormalities of gait and mobility: Secondary | ICD-10-CM | POA: Diagnosis not present

## 2016-01-23 DIAGNOSIS — M6281 Muscle weakness (generalized): Secondary | ICD-10-CM | POA: Diagnosis not present

## 2016-01-23 DIAGNOSIS — R278 Other lack of coordination: Secondary | ICD-10-CM | POA: Diagnosis not present

## 2016-01-24 DIAGNOSIS — R2689 Other abnormalities of gait and mobility: Secondary | ICD-10-CM | POA: Diagnosis not present

## 2016-01-24 DIAGNOSIS — M6281 Muscle weakness (generalized): Secondary | ICD-10-CM | POA: Diagnosis not present

## 2016-01-24 DIAGNOSIS — R278 Other lack of coordination: Secondary | ICD-10-CM | POA: Diagnosis not present

## 2016-01-27 DIAGNOSIS — R278 Other lack of coordination: Secondary | ICD-10-CM | POA: Diagnosis not present

## 2016-01-27 DIAGNOSIS — M6281 Muscle weakness (generalized): Secondary | ICD-10-CM | POA: Diagnosis not present

## 2016-01-27 DIAGNOSIS — R2689 Other abnormalities of gait and mobility: Secondary | ICD-10-CM | POA: Diagnosis not present

## 2016-01-28 DIAGNOSIS — R2689 Other abnormalities of gait and mobility: Secondary | ICD-10-CM | POA: Diagnosis not present

## 2016-01-28 DIAGNOSIS — R278 Other lack of coordination: Secondary | ICD-10-CM | POA: Diagnosis not present

## 2016-01-28 DIAGNOSIS — M6281 Muscle weakness (generalized): Secondary | ICD-10-CM | POA: Diagnosis not present

## 2016-01-29 DIAGNOSIS — R2689 Other abnormalities of gait and mobility: Secondary | ICD-10-CM | POA: Diagnosis not present

## 2016-01-29 DIAGNOSIS — R278 Other lack of coordination: Secondary | ICD-10-CM | POA: Diagnosis not present

## 2016-01-29 DIAGNOSIS — M6281 Muscle weakness (generalized): Secondary | ICD-10-CM | POA: Diagnosis not present

## 2016-01-30 DIAGNOSIS — R278 Other lack of coordination: Secondary | ICD-10-CM | POA: Diagnosis not present

## 2016-01-30 DIAGNOSIS — M6281 Muscle weakness (generalized): Secondary | ICD-10-CM | POA: Diagnosis not present

## 2016-01-30 DIAGNOSIS — R2689 Other abnormalities of gait and mobility: Secondary | ICD-10-CM | POA: Diagnosis not present

## 2016-01-31 DIAGNOSIS — M6281 Muscle weakness (generalized): Secondary | ICD-10-CM | POA: Diagnosis not present

## 2016-01-31 DIAGNOSIS — R2689 Other abnormalities of gait and mobility: Secondary | ICD-10-CM | POA: Diagnosis not present

## 2016-01-31 DIAGNOSIS — R278 Other lack of coordination: Secondary | ICD-10-CM | POA: Diagnosis not present

## 2016-02-01 DIAGNOSIS — R2689 Other abnormalities of gait and mobility: Secondary | ICD-10-CM | POA: Diagnosis not present

## 2016-02-01 DIAGNOSIS — R278 Other lack of coordination: Secondary | ICD-10-CM | POA: Diagnosis not present

## 2016-02-01 DIAGNOSIS — M6281 Muscle weakness (generalized): Secondary | ICD-10-CM | POA: Diagnosis not present

## 2016-02-03 DIAGNOSIS — M6281 Muscle weakness (generalized): Secondary | ICD-10-CM | POA: Diagnosis not present

## 2016-02-03 DIAGNOSIS — R2689 Other abnormalities of gait and mobility: Secondary | ICD-10-CM | POA: Diagnosis not present

## 2016-02-03 DIAGNOSIS — R278 Other lack of coordination: Secondary | ICD-10-CM | POA: Diagnosis not present

## 2016-02-04 DIAGNOSIS — R278 Other lack of coordination: Secondary | ICD-10-CM | POA: Diagnosis not present

## 2016-02-04 DIAGNOSIS — R2689 Other abnormalities of gait and mobility: Secondary | ICD-10-CM | POA: Diagnosis not present

## 2016-02-04 DIAGNOSIS — M6281 Muscle weakness (generalized): Secondary | ICD-10-CM | POA: Diagnosis not present

## 2016-02-05 DIAGNOSIS — R2689 Other abnormalities of gait and mobility: Secondary | ICD-10-CM | POA: Diagnosis not present

## 2016-02-05 DIAGNOSIS — M6281 Muscle weakness (generalized): Secondary | ICD-10-CM | POA: Diagnosis not present

## 2016-02-05 DIAGNOSIS — R278 Other lack of coordination: Secondary | ICD-10-CM | POA: Diagnosis not present

## 2016-02-06 DIAGNOSIS — R278 Other lack of coordination: Secondary | ICD-10-CM | POA: Diagnosis not present

## 2016-02-06 DIAGNOSIS — M6281 Muscle weakness (generalized): Secondary | ICD-10-CM | POA: Diagnosis not present

## 2016-02-06 DIAGNOSIS — R2689 Other abnormalities of gait and mobility: Secondary | ICD-10-CM | POA: Diagnosis not present

## 2016-02-07 DIAGNOSIS — R2689 Other abnormalities of gait and mobility: Secondary | ICD-10-CM | POA: Diagnosis not present

## 2016-02-07 DIAGNOSIS — R278 Other lack of coordination: Secondary | ICD-10-CM | POA: Diagnosis not present

## 2016-02-07 DIAGNOSIS — M6281 Muscle weakness (generalized): Secondary | ICD-10-CM | POA: Diagnosis not present

## 2016-02-10 DIAGNOSIS — R278 Other lack of coordination: Secondary | ICD-10-CM | POA: Diagnosis not present

## 2016-02-10 DIAGNOSIS — M6281 Muscle weakness (generalized): Secondary | ICD-10-CM | POA: Diagnosis not present

## 2016-02-10 DIAGNOSIS — R2689 Other abnormalities of gait and mobility: Secondary | ICD-10-CM | POA: Diagnosis not present

## 2016-02-11 DIAGNOSIS — M6281 Muscle weakness (generalized): Secondary | ICD-10-CM | POA: Diagnosis not present

## 2016-02-11 DIAGNOSIS — R278 Other lack of coordination: Secondary | ICD-10-CM | POA: Diagnosis not present

## 2016-02-11 DIAGNOSIS — R2689 Other abnormalities of gait and mobility: Secondary | ICD-10-CM | POA: Diagnosis not present

## 2016-02-12 IMAGING — CT CT ANGIO CHEST
2 of 6 series · 18 of 36 positions shown · IV contrast (OMNIPAQUE 350)
Comparison: Chest radiograph from earlier today.

CLINICAL DATA: 83-year-old female with a reported history asthma,
wheezing and shortness of breath. Chest pain.

EXAM:
CT ANGIOGRAPHY CHEST WITH CONTRAST
TECHNIQUE: Multidetector CT imaging of the chest was performed using the
standard protocol during bolus administration of intravenous
contrast. Multiplanar CT image reconstructions and MIPs were
obtained to evaluate the vascular anatomy.
CONTRAST:  100mL OMNIPAQUE IOHEXOL 350 MG/ML SOLN

[Series 5: coronal mpr · coronal · 0.44mm/px · 1 of 108 slices shown]
[im 54/108  mediastinal]
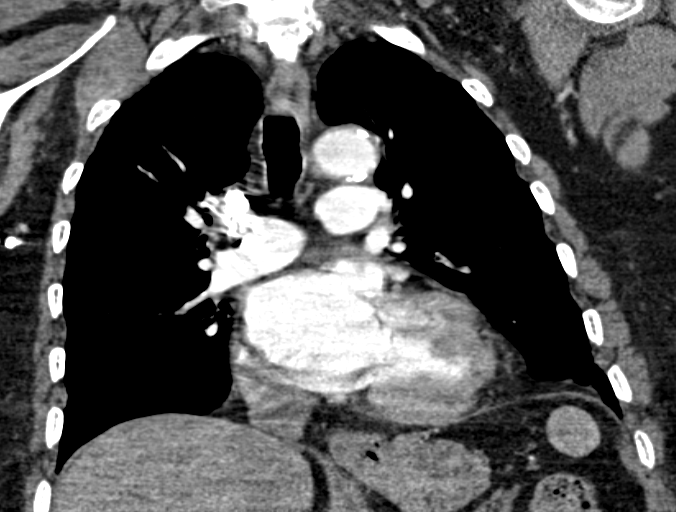

[Series 10: thins for pacs · axial · 0.65mm/px · z∈[-226,-24]mm · 17 of 227 slices shown]
[im 12/227  lung]
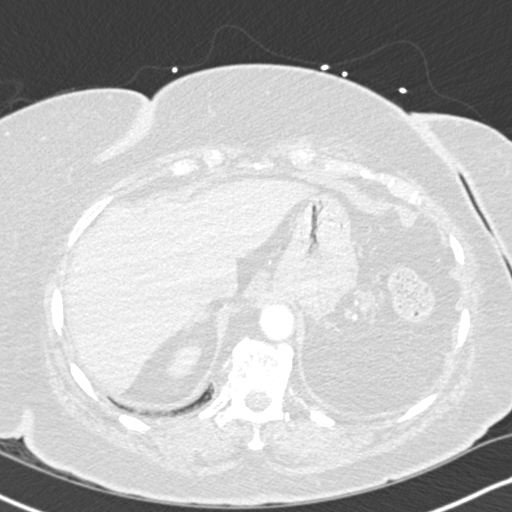
[im 23/227  mediastinal]
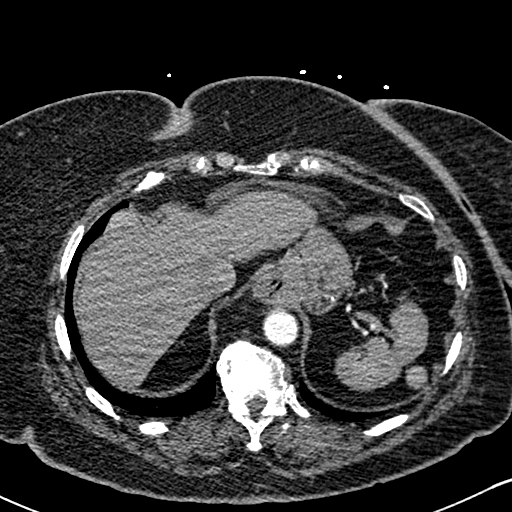
[im 34/227  lung]
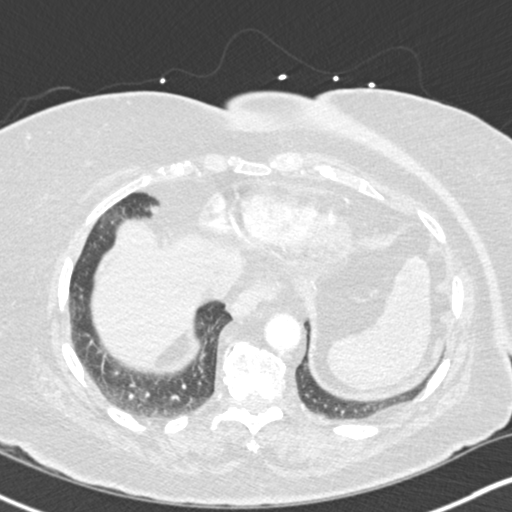
[im 46/227  mediastinal]
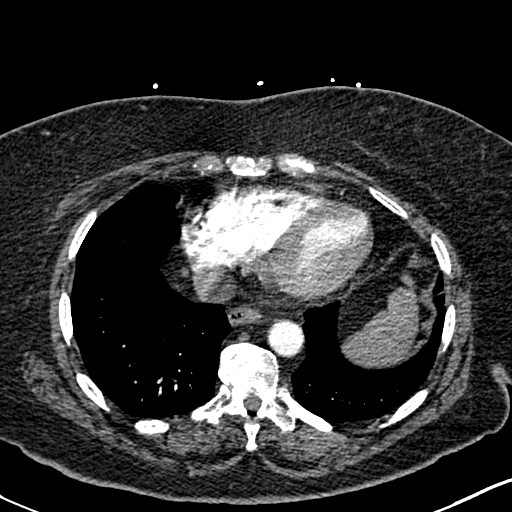
[im 68/227  lung]
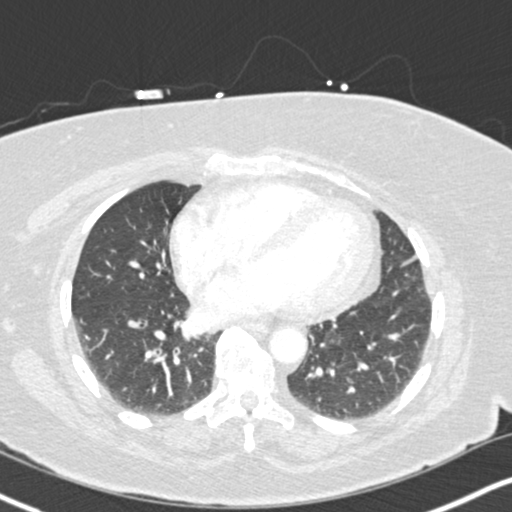
[im 80/227  mediastinal]
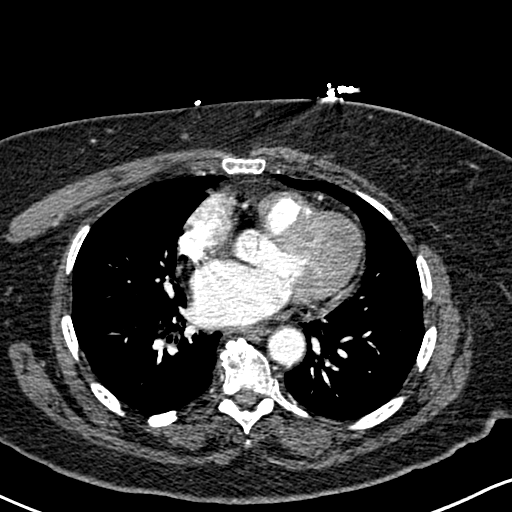
[im 91/227  lung]
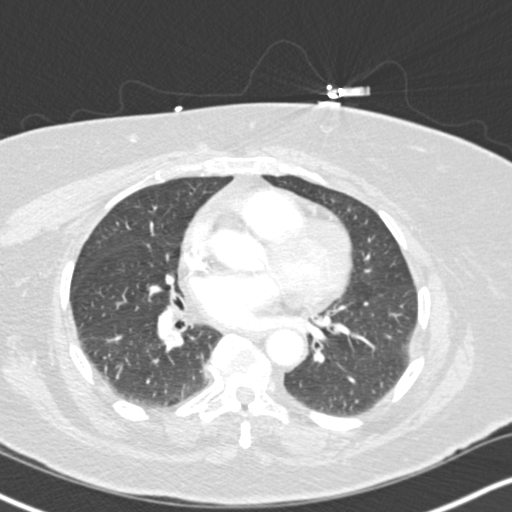
[im 102/227  mediastinal]
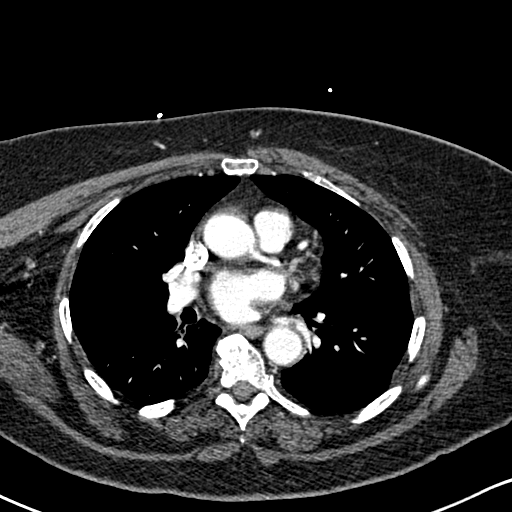
[im 114/227  lung]
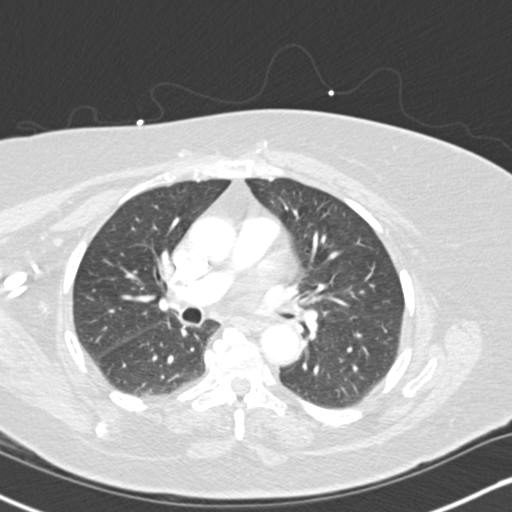
[im 125/227  mediastinal]
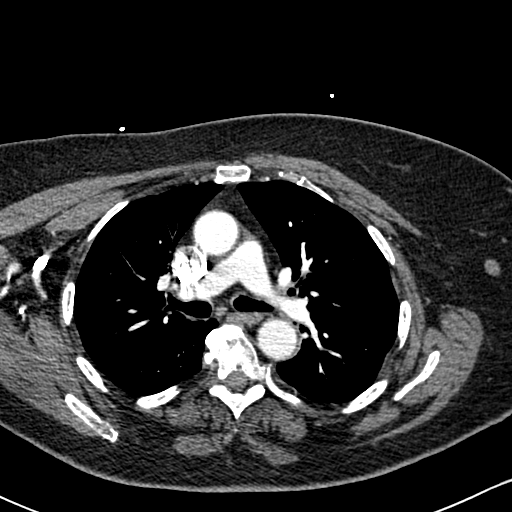
[im 136/227  lung]
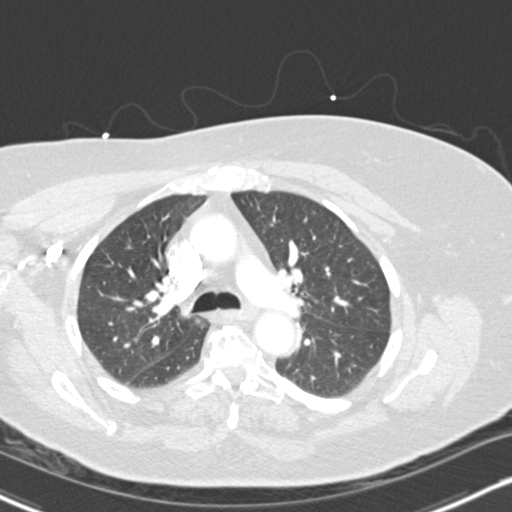
[im 147/227  mediastinal]
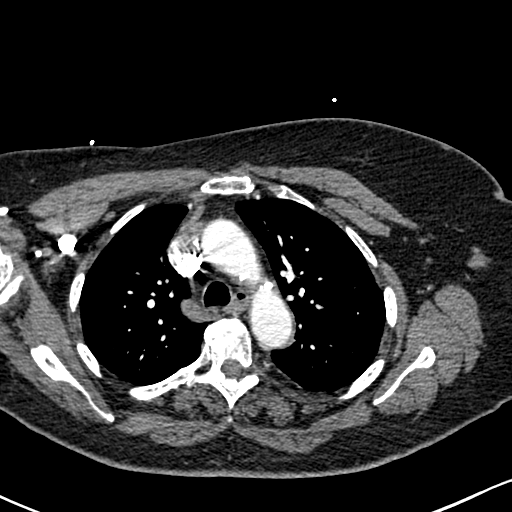
[im 159/227  lung]
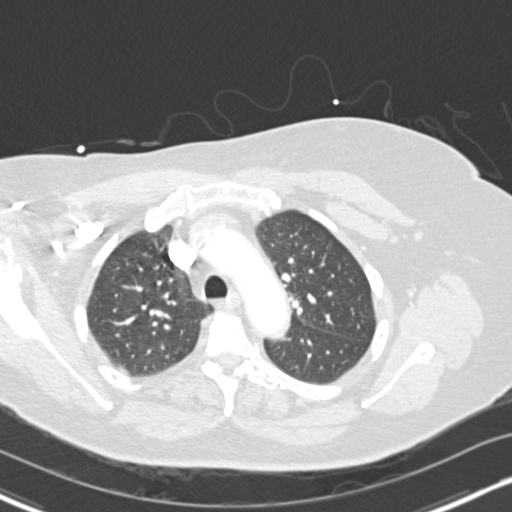
[im 181/227  mediastinal]
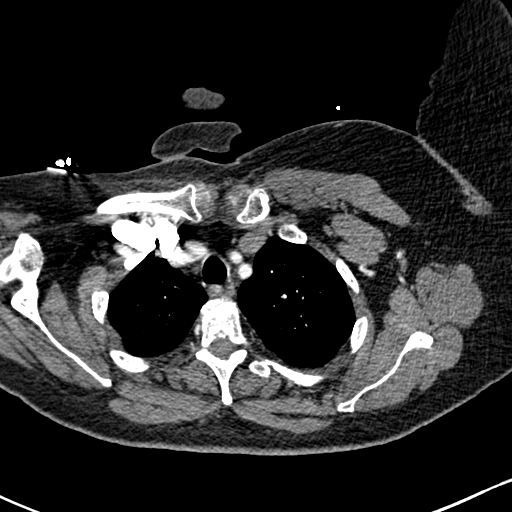
[im 193/227  lung]
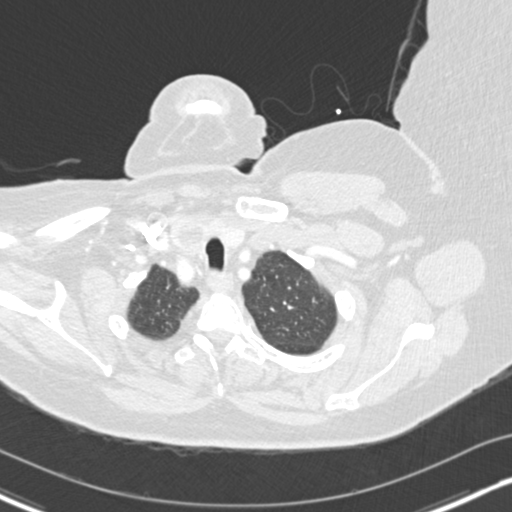
[im 204/227  mediastinal]
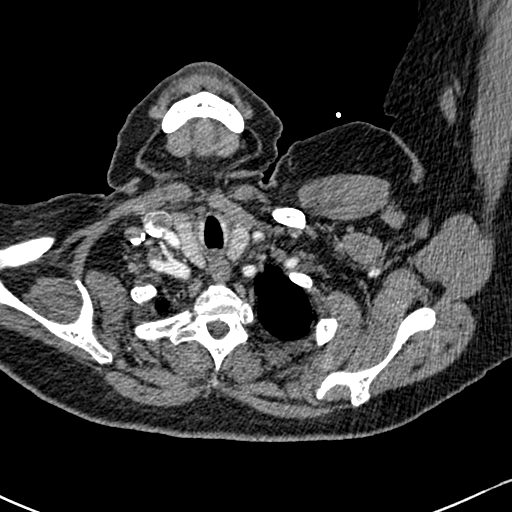
[im 215/227  lung]
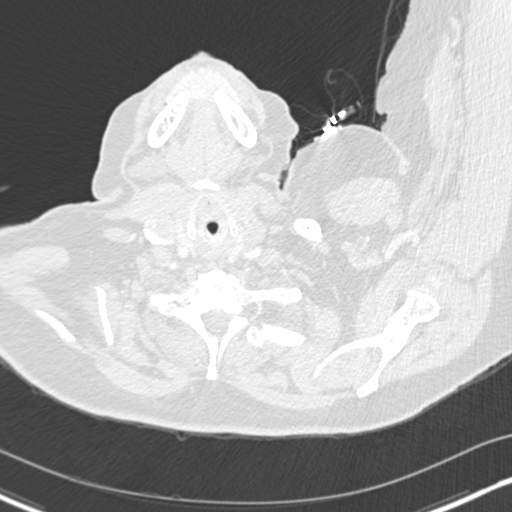

[18 of 36 positions shown; findings below may reference images not displayed]

FINDINGS: Mediastinum/Nodes: Normal heart size. No dilatation of the right
ventricle or right atrium. Normal position of the interventricular
septum. No contrast reflux into the IVC. No pericardial
fluid/thickening. Atherosclerotic nonaneurysmal thoracic aorta.
Normal caliber main pulmonary artery. There are subsegmental acute
pulmonary emboli in the right lower lobe (series 10/ image 162) and
possibly in the left upper lobe (10/77). No central pulmonary
emboli. Hypodense 1.6 cm right thyroid lobe nodule. Normal
esophagus. No axillary, mediastinal or hilar lymphadenopathy.

Lungs/Pleura: No pneumothorax. No pleural effusion. No significant
pulmonary nodules, lung masses or acute consolidative airspace
disease. Mild subpleural reticulation in the dependent lower lobes
is likely due to hypoventilation.

Upper abdomen: Tiny hiatal hernia.

Musculoskeletal: Moth-eaten appearance of the bones, with no focal
aggressive osseous lesion.

Review of the MIP images confirms the above findings.
IMPRESSION: 1. Low burden acute subsegmental pulmonary embolism. No CT evidence
of pulmonary arterial hypertension or right heart strain.
2. Moth-eaten appearance of the bones, a nonspecific finding that
could be due to osteopenia or infiltrative osseous process.
3. Tiny hiatal hernia.
Critical Value/emergent results were called by telephone at the time
of interpretation on 10/30/2014 at [DATE] to PA Xuexin Apel, who
verbally acknowledged these results.

## 2016-02-13 ENCOUNTER — Non-Acute Institutional Stay (SKILLED_NURSING_FACILITY): Payer: Medicare Other | Admitting: Adult Health

## 2016-02-13 ENCOUNTER — Encounter: Payer: Self-pay | Admitting: Adult Health

## 2016-02-13 DIAGNOSIS — I10 Essential (primary) hypertension: Secondary | ICD-10-CM | POA: Diagnosis not present

## 2016-02-13 DIAGNOSIS — G4700 Insomnia, unspecified: Secondary | ICD-10-CM | POA: Diagnosis not present

## 2016-02-13 DIAGNOSIS — K5909 Other constipation: Secondary | ICD-10-CM | POA: Diagnosis not present

## 2016-02-13 DIAGNOSIS — K219 Gastro-esophageal reflux disease without esophagitis: Secondary | ICD-10-CM

## 2016-02-13 DIAGNOSIS — I209 Angina pectoris, unspecified: Secondary | ICD-10-CM | POA: Diagnosis not present

## 2016-02-13 DIAGNOSIS — I25708 Atherosclerosis of coronary artery bypass graft(s), unspecified, with other forms of angina pectoris: Secondary | ICD-10-CM

## 2016-02-13 DIAGNOSIS — J410 Simple chronic bronchitis: Secondary | ICD-10-CM | POA: Diagnosis not present

## 2016-02-13 DIAGNOSIS — G629 Polyneuropathy, unspecified: Secondary | ICD-10-CM

## 2016-02-13 DIAGNOSIS — H8110 Benign paroxysmal vertigo, unspecified ear: Secondary | ICD-10-CM

## 2016-02-13 DIAGNOSIS — M159 Polyosteoarthritis, unspecified: Secondary | ICD-10-CM | POA: Diagnosis not present

## 2016-02-13 IMAGING — DX DG ANKLE COMPLETE 3+V*R*
1 series · 1 of 1 positions shown · non-contrast
Comparison: Radiograph dated 10/01/2014

CLINICAL DATA: 83-year-old female with right ankle fracture.

EXAM:
RIGHT ANKLE - COMPLETE 3+ VIEW

[ankle ap]
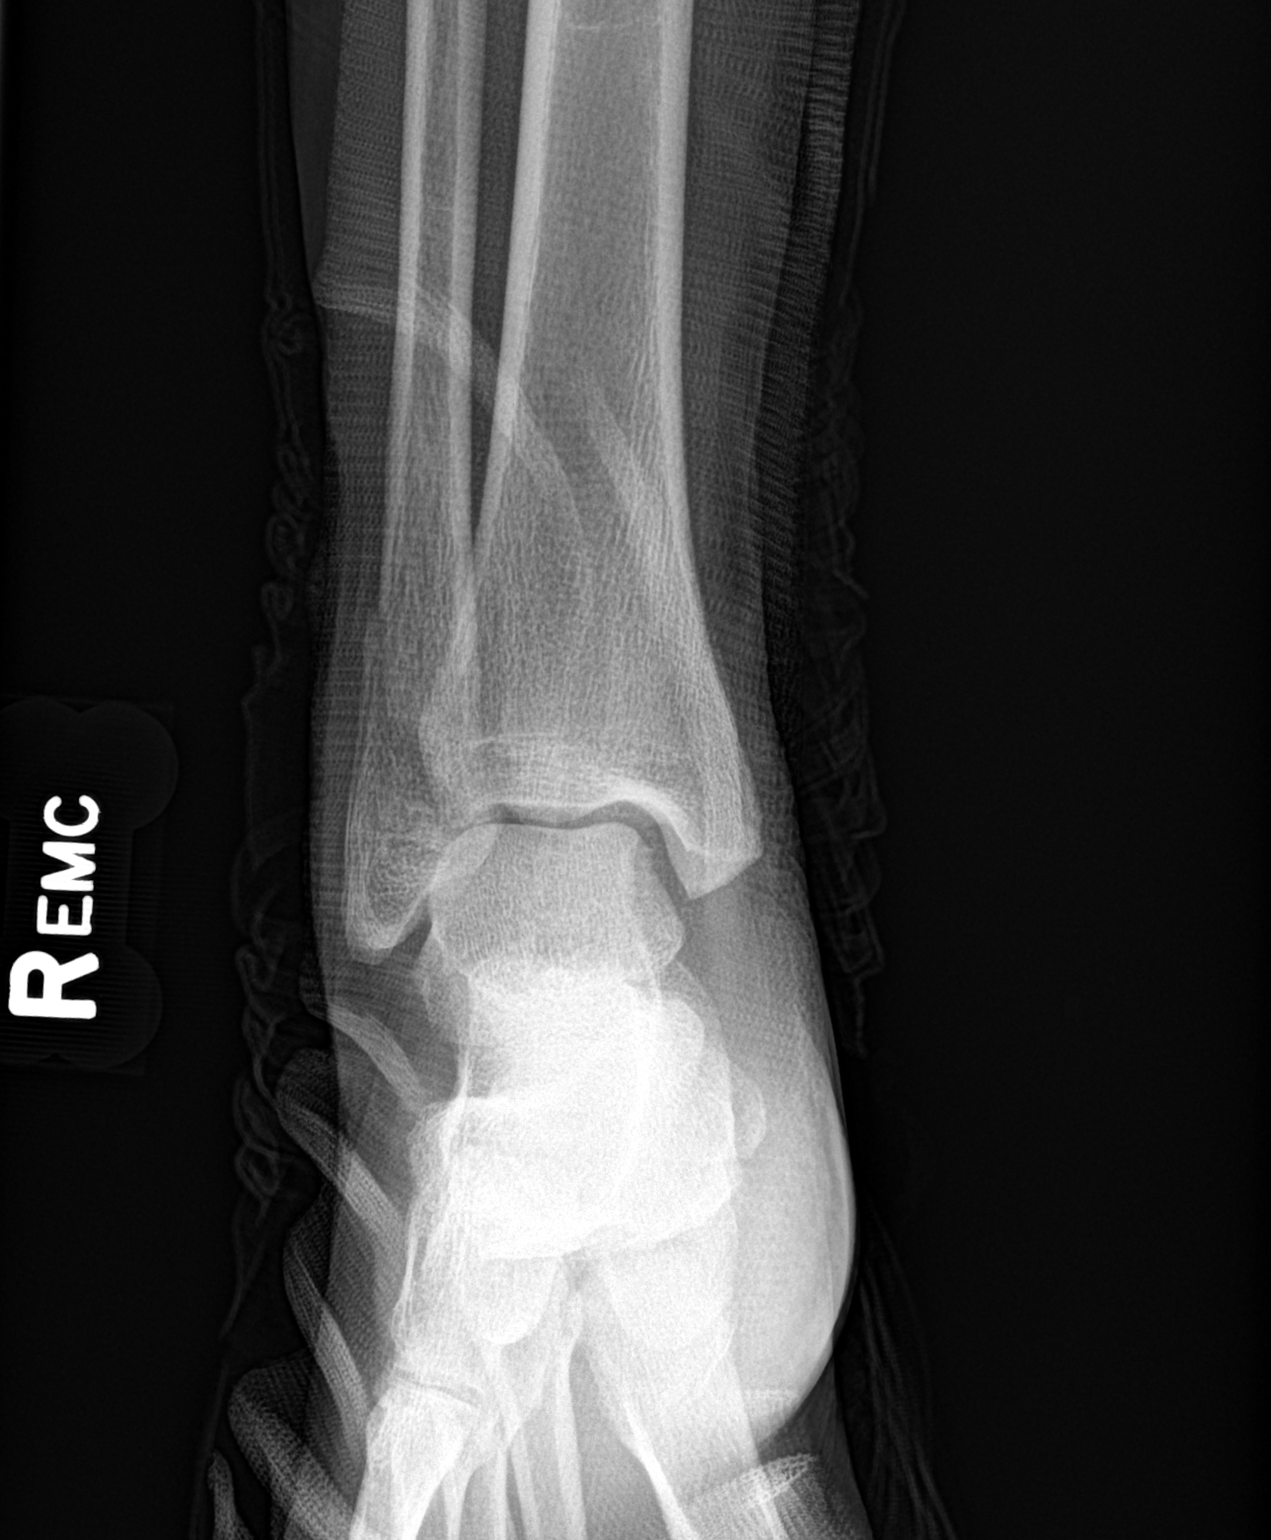

[1 of 1 positions shown; findings below may reference images not displayed]

FINDINGS: Evaluation of the study is limited due to chest overlying the ankle.
A minimally displaced oblique fracture of the distal fibula is again
seen. The tibiotalar articulation is preserved. The soft tissues are
unremarkable.
IMPRESSION: Minimally displaced oblique fracture of the distal fibula.

## 2016-02-13 NOTE — Progress Notes (Signed)
DATE:    02/13/16  MRN:  [id number]  BIRTHDAY: Aug 14, 1930  Facility:  Nursing Home Location:  [facility]  Nursing Home Room Number: 804-B  LEVEL OF CARE:  SNF [phone])  Contact Information    Name Relation Home Work [place] Daughter [phone]  [phone]   Laporscha, [place] Daughter [phone]  [phone]       Code Status History    Date Active Date Inactive Code Status Order ID Comments User Context   10/30/2014  9:41 PM 11/02/2014 10:05 PM Partial Code [id number]  Lorretta Harp, MD Inpatient    Questions for Most Recent Historical Code Status (Order [id number])    Question Answer Comment   In the event of cardiac or respiratory ARREST: Initiate Code Blue, Call Rapid Response Yes    In the event of cardiac or respiratory ARREST: Perform CPR Yes    In the event of cardiac or respiratory ARREST: Perform Intubation/Mechanical Ventilation No    In the event of cardiac or respiratory ARREST: Use NIPPV/BiPAp only if indicated Yes    In the event of cardiac or respiratory ARREST: Administer ACLS medications if indicated Yes    In the event of cardiac or respiratory ARREST: Perform Defibrillation or Cardioversion if indicated Yes        Chief Complaint  Patient presents with  . Medical Management of Chronic Issues    HISTORY OF PRESENT ILLNESS:  This is an 82 year old female who is being seen for a routine visit. She was recently discharged from OT.  She has been admitted to [facility] on 01/14/16 from [facility]. She has been admitted for long-term care. She has PMH of COPD, hypertension, GERD, PE and constipation.   PAST MEDICAL HISTORY:  Past Medical History:  Diagnosis Date  . Arthritis   . Asthma   . COPD (chronic obstructive pulmonary disease) (HCC)   . GERD (gastroesophageal reflux disease)   . Hypertension      CURRENT MEDICATIONS: Reviewed  Patient's Medications  New Prescriptions   No  medications on file  Previous Medications   ACETAMINOPHEN (TYLENOL) 500 MG TABLET    Take 1,000 mg by mouth every 6 (six) hours as needed for mild pain or moderate pain.    ALBUTEROL (PROVENTIL HFA;VENTOLIN HFA) 108 (90 BASE) MCG/ACT INHALER    Inhale 2 puffs into the lungs every 6 (six) hours as needed for wheezing or shortness of breath.    AMINO ACIDS-PROTEIN HYDROLYS (FEEDING SUPPLEMENT, PRO-STAT SUGAR FREE 64,) LIQD    Take 30 mLs by mouth daily.   BUDESONIDE-FORMOTEROL (SYMBICORT) 160-4.5 MCG/ACT INHALER    Inhale 2 puffs into the lungs 2 (two) times daily.   CARBOXYMETHYLCELLULOSE SODIUM 0.25 % SOLN    Place 1 drop into both eyes 2 (two) times daily.    DEXTROMETHORPHAN-GUAIFENESIN (MUCINEX DM) 30-600 MG 12HR TABLET    Take 1 tablet by mouth 2 (two) times daily.   ESOMEPRAZOLE (NEXIUM) 40 MG CAPSULE    Take 40 mg by mouth daily at 12 noon.    GABAPENTIN (NEURONTIN) 100 MG CAPSULE    Take 100 mg by mouth at bedtime.   HYDROCHLOROTHIAZIDE (MICROZIDE) 12.5 MG CAPSULE    Take 12.5 mg by mouth daily.    LOSARTAN (COZAAR) 50 MG TABLET    Take 50 mg by mouth daily.   MECLIZINE (ANTIVERT) 12.5 MG TABLET    Take 12.5 mg by mouth 3 (three) times daily as needed for dizziness.  MENTHOL, TOPICAL ANALGESIC, (BIOFREEZE) 4 % GEL    Apply to neck and right shoulder three times a day for pain.   MONTELUKAST (SINGULAIR) 10 MG TABLET    Take 10 mg by mouth at bedtime.   MULTIPLE VITAMINS-MINERALS (MULTIVITAMIN WITH MINERALS) TABLET    Take 1 tablet by mouth daily.   NITROGLYCERIN (NITROSTAT) 0.4 MG SL TABLET    Place 0.4 mg under the tongue every 5 (five) minutes as needed for chest pain (Do not exceed 3 doses per episode.).   POLYETHYLENE GLYCOL (MIRALAX / GLYCOLAX) PACKET    Take 17 g by mouth daily.   SENNOSIDES-DOCUSATE SODIUM (SENOKOT-S) 8.6-50 MG TABLET    Take 2 tablets by mouth 2 (two) times daily.    TRAMADOL (ULTRAM) 50 MG TABLET    Take one tablet by mouth at bedtime for pain   TRAMADOL (ULTRAM)  50 MG TABLET    Take by mouth every 6 (six) hours as needed.   TRAZODONE (DESYREL) 150 MG TABLET    Take 75 mg by mouth at bedtime. Take 1/2 tablet to = 75 mg qhs  Modified Medications   No medications on file  Discontinued Medications   No medications on file     No Known Allergies   REVIEW OF SYSTEMS:  GENERAL: no change in appetite, no fatigue, no weight changes, no fever, chills or weakness EYES: Denies change in vision, dry eyes, eye pain, itching or discharge EARS: Denies change in hearing, ringing in ears, or earache NOSE: Denies nasal congestion or epistaxis MOUTH and THROAT: Denies oral discomfort, gingival pain or bleeding, pain from teeth or hoarseness   RESPIRATORY: no cough, SOB, DOE, wheezing, hemoptysis CARDIAC: no chest pain, edema or palpitations GI: no abdominal pain, diarrhea, heart burn, nausea or vomiting, +constipation GU: Denies dysuria, frequency, hematuria, incontinence, or discharge PSYCHIATRIC: Denies feeling of depression or anxiety. No report of hallucinations, insomnia, paranoia, or agitation    PHYSICAL EXAMINATION  GENERAL APPEARANCE: Well nourished. In no acute distress. Obese SKIN:  Skin is warm and dry.  HEAD: Normal in size and contour. No evidence of trauma EYES: Lids open and close normally. No blepharitis, entropion or ectropion. PERRL. Conjunctivae are clear and sclerae are white. Lenses are without opacity EARS: Pinnae are normal. Patient hears normal voice tunes of the examiner MOUTH and THROAT: Lips are without lesions. Oral mucosa is moist and without lesions. Tongue is normal in shape, size, and color and without lesions NECK: supple, trachea midline, no neck masses, no thyroid tenderness, no thyromegaly LYMPHATICS: no LAN in the neck, no supraclavicular LAN RESPIRATORY: breathing is even & unlabored, BS CTAB CARDIAC: RRR, no murmur,no extra heart sounds, no edema GI: abdomen soft, normal BS, no masses, no tenderness, no  hepatomegaly, no splenomegaly EXTREMITIES:  Able to move 4 extremities; BLE with generalized weakness PSYCHIATRIC: Alert and oriented X 3. Affect and behavior are appropriate  LABS/RADIOLOGY: Labs reviewed: Basic Metabolic Panel:  Recent Labs  16/11/9606/20/17 11/02/15 01/20/16  NA 139 136* 141  K 4.0 3.8 4.2  BUN 14 14 14   CREATININE 0.9 0.8 0.8   Liver Function Tests:  Recent Labs  09/15/15 10/13/15 01/20/16  AST 14 14 12*  ALT 8 9 7   ALKPHOS 57 52 64    CBC:  Recent Labs  10/13/15 11/02/15 01/20/16  WBC 6.2 6.7 6.3  NEUTROABS 2,976  --   --   HGB 10.5* 11.0* 10.8*  HCT 32* 33* 32*  PLT 230 228 216  Lipid Panel:  Recent Labs  09/15/15 10/13/15  HDL 64 64      ASSESSMENT/PLAN:  Arthritis - right shoulder,neck  and bilateral knees -continue Biofreeze 4% gel 3 times a day, tramadol 50 mg 1 tab by mouth daily at bedtime, tramadol 50 mg 1 tab by mouth every 6 hours when necessary and Tylenol extra strength 500 mg 2 tabs by mouth every 6 hours when necessary  Chronic bronchitis - no wheezing nor SOB; continue Symbicort AER 160-4.5 mcg 2 puffs by mouth twice a day, Singulair 10 mg 1 tab by mouth daily at bedtime, Proventil HFA 90 g inhaler 2 puffs in 2 lungs every 6 hours when necessary and Mucinex DM 600-30 mg 1 tab by mouth every 12 hours  Hypertension - well-controlled; continue HCTZ 12.5 mg 1 tab by mouth daily and losartan 50 mg 1 tab by mouth daily  Neuropathy - continue gabapentin 100 mg 1 capsule by mouth daily at bedtime  Insomnia - continue trazodone 150 mg 1/2 tab = 75 mg by mouth daily at bedtime  GERD - continue Nexium 40 mg 1 capsule by mouth daily @ noon  Vertigo - continue meclizine 12.5 mg 1 caplet mouth 3 times a day  CAD - stable; continue Nitrostat 0.4 mg 1 tablet sublingual every 5 minutes when necessary max of 3 doses  Constipation - continue senna S to 2 tabs by mouth twice a day and MiraLAX 17 g by mouth twice a day    Goals of care:   Long-term care     Kenard GowerMonina Medina-Vargas - NP [facility] [phone]

## 2016-03-17 ENCOUNTER — Encounter: Payer: Self-pay | Admitting: Adult Health

## 2016-03-17 ENCOUNTER — Non-Acute Institutional Stay (SKILLED_NURSING_FACILITY): Payer: Medicare Other | Admitting: Adult Health

## 2016-03-17 DIAGNOSIS — I25708 Atherosclerosis of coronary artery bypass graft(s), unspecified, with other forms of angina pectoris: Secondary | ICD-10-CM

## 2016-03-17 DIAGNOSIS — I209 Angina pectoris, unspecified: Secondary | ICD-10-CM

## 2016-03-17 DIAGNOSIS — K5901 Slow transit constipation: Secondary | ICD-10-CM

## 2016-03-17 DIAGNOSIS — H8110 Benign paroxysmal vertigo, unspecified ear: Secondary | ICD-10-CM | POA: Diagnosis not present

## 2016-03-17 DIAGNOSIS — K219 Gastro-esophageal reflux disease without esophagitis: Secondary | ICD-10-CM

## 2016-03-17 DIAGNOSIS — I10 Essential (primary) hypertension: Secondary | ICD-10-CM | POA: Diagnosis not present

## 2016-03-17 NOTE — Progress Notes (Signed)
DATE:  03/17/2016   MRN:  [id number]  BIRTHDAY: 1930/11/11  Facility:  Nursing Home Location:  [facility]  Nursing Home Room Number: [id number]  LEVEL OF CARE:  SNF [phone])  Contact Information    Name Relation Home Work [place] Daughter [phone]  [phone]   [place], [place] Daughter [phone]  [phone]       Code Status History    Date Active Date Inactive Code Status Order ID Comments User Context   10/30/2014  9:41 PM 11/02/2014 10:05 PM Partial Code [id number]  Lorretta Harp, MD Inpatient    Questions for Most Recent Historical Code Status (Order [id number])    Question Answer Comment   In the event of cardiac or respiratory ARREST: Initiate Code Blue, Call Rapid Response Yes    In the event of cardiac or respiratory ARREST: Perform CPR Yes    In the event of cardiac or respiratory ARREST: Perform Intubation/Mechanical Ventilation No    In the event of cardiac or respiratory ARREST: Use NIPPV/BiPAp only if indicated Yes    In the event of cardiac or respiratory ARREST: Administer ACLS medications if indicated Yes    In the event of cardiac or respiratory ARREST: Perform Defibrillation or Cardioversion if indicated Yes        Chief Complaint  Patient presents with  . Medical Management of Chronic Issues    HISTORY OF PRESENT ILLNESS:  This is an 85-YO female seen for a routine visit.  She is a long-term care resident at [facility]. She was seen in the room and complained of constipation.     PAST MEDICAL HISTORY:  Past Medical History:  Diagnosis Date  . Arthritis   . Asthma   . COPD (chronic obstructive pulmonary disease) (HCC)   . GERD (gastroesophageal reflux disease)   . Hypertension      CURRENT MEDICATIONS: Reviewed  Patient's Medications  New Prescriptions   No medications on file  Previous Medications   ACETAMINOPHEN (TYLENOL) 500 MG TABLET    Take 1,000 mg by mouth every 6 (six) hours  as needed for mild pain or moderate pain.    ALBUTEROL (PROVENTIL HFA;VENTOLIN HFA) 108 (90 BASE) MCG/ACT INHALER    Inhale 2 puffs into the lungs every 6 (six) hours as needed for wheezing or shortness of breath.    AMINO ACIDS-PROTEIN HYDROLYS (FEEDING SUPPLEMENT, PRO-STAT SUGAR FREE 64,) LIQD    Take 30 mLs by mouth daily.   BUDESONIDE-FORMOTEROL (SYMBICORT) 160-4.5 MCG/ACT INHALER    Inhale 2 puffs into the lungs 2 (two) times daily.   CARBOXYMETHYLCELLULOSE SODIUM 0.25 % SOLN    Place 1 drop into both eyes 2 (two) times daily.    DEXTROMETHORPHAN-GUAIFENESIN (MUCINEX DM) 30-600 MG 12HR TABLET    Take 1 tablet by mouth 2 (two) times daily.   ESOMEPRAZOLE (NEXIUM) 40 MG CAPSULE    Take 40 mg by mouth daily at 12 noon.    GABAPENTIN (NEURONTIN) 100 MG CAPSULE    Take 100 mg by mouth at bedtime.   HYDROCHLOROTHIAZIDE (MICROZIDE) 12.5 MG CAPSULE    Take 12.5 mg by mouth daily.    LOSARTAN (COZAAR) 50 MG TABLET    Take 50 mg by mouth daily.   MECLIZINE (ANTIVERT) 12.5 MG TABLET    Take 12.5 mg by mouth 3 (three) times daily as needed for dizziness.   MENTHOL, TOPICAL ANALGESIC, (BIOFREEZE) 4 % GEL    Apply to neck and right shoulder three  times a day for pain.   MONTELUKAST (SINGULAIR) 10 MG TABLET    Take 10 mg by mouth at bedtime.    MULTIPLE VITAMINS-MINERALS (MULTIVITAMIN WITH MINERALS) TABLET    Take 1 tablet by mouth daily.   NITROGLYCERIN (NITROSTAT) 0.4 MG SL TABLET    Place 0.4 mg under the tongue every 5 (five) minutes as needed for chest pain (Do not exceed 3 doses per episode.).   POLYETHYLENE GLYCOL (MIRALAX / GLYCOLAX) PACKET    Take 17 g by mouth daily.   SENNOSIDES-DOCUSATE SODIUM (SENOKOT-S) 8.6-50 MG TABLET    Take 2 tablets by mouth 2 (two) times daily.    TRAMADOL (ULTRAM) 50 MG TABLET    Take one tablet by mouth at bedtime for pain   TRAMADOL (ULTRAM) 50 MG TABLET    Take by mouth every 6 (six) hours as needed.   TRAZODONE (DESYREL) 150 MG TABLET    Take 75 mg by mouth at  bedtime. Take 1/2 tablet to = 75 mg qhs  Modified Medications   No medications on file  Discontinued Medications   No medications on file     No Known Allergies   REVIEW OF SYSTEMS:  GENERAL: no change in appetite, no fatigue, no weight changes, no fever, chills or weakness EYES: Denies change in vision, dry eyes, eye pain, itching or discharge EARS: Denies change in hearing, ringing in ears, or earache NOSE: Denies nasal congestion or epistaxis MOUTH and THROAT: Denies oral discomfort, gingival pain or bleeding, pain from teeth or hoarseness   RESPIRATORY: no cough, SOB, DOE, wheezing, hemoptysis CARDIAC: no chest pain, edema or palpitations GI: no abdominal pain, diarrhea, heart burn, nausea or vomiting, +constipation GU: Denies dysuria, frequency, hematuria, incontinence, or discharge PSYCHIATRIC: Denies feeling of depression or anxiety. No report of hallucinations, insomnia, paranoia, or agitation    PHYSICAL EXAMINATION  GENERAL APPEARANCE: Well nourished. In no acute distress. Obese SKIN:  Skin is warm and dry.  HEAD: Normal in size and contour. No evidence of trauma EYES: Lids open and close normally. No blepharitis, entropion or ectropion. PERRL. Conjunctivae are clear and sclerae are white. Lenses are without opacity EARS: Pinnae are normal. Patient hears normal voice tunes of the examiner MOUTH and THROAT: Lips are without lesions. Oral mucosa is moist and without lesions. Tongue is normal in shape, size, and color and without lesions NECK: supple, trachea midline, no neck masses, no thyroid tenderness, no thyromegaly LYMPHATICS: no LAN in the neck, no supraclavicular LAN RESPIRATORY: breathing is even & unlabored, BS CTAB CARDIAC: RRR, no murmur,no extra heart sounds, no edema GI: abdomen soft, normal BS, no masses, no tenderness, no hepatomegaly, no splenomegaly EXTREMITIES:  Able to move X 4 extremities; BLE with generalized weakness PSYCHIATRIC: Alert and  oriented X 3. Affect and behavior are appropriate    LABS/RADIOLOGY: Labs reviewed: Basic Metabolic Panel:  Recent Labs  95/62/1308/20/17 11/02/15 01/20/16  NA 139 136* 141  K 4.0 3.8 4.2  BUN 14 14 14   CREATININE 0.9 0.8 0.8   Liver Function Tests:  Recent Labs  09/15/15 10/13/15 01/20/16  AST 14 14 12*  ALT 8 9 7   ALKPHOS 57 52 64   CBC:  Recent Labs  10/13/15 11/02/15 01/20/16  WBC 6.2 6.7 6.3  NEUTROABS 2,976  --   --   HGB 10.5* 11.0* 10.8*  HCT 32* 33* 32*  PLT 230 228 216   Lipid Panel:  Recent Labs  09/15/15 10/13/15  HDL 64 64  ASSESSMENT/PLAN:  CAD - No chest pain; continue NTG when necessary  Constipation - start Dulcolax suppository 10 mg 1 rectally daily 1 then when necessary   Hypertension - well controlled; continue HCTZ and losartan; BP twice a day 1 week   Vertigo - continue meclizine 12.5 mg by mouth 3 times a day  GERD - continue Nexium 40 mg 1 capsule by mouth every 12 noon     Goals of care:  Long-term care    Leslie Farley - NP [employer] [phone]

## 2016-04-08 DIAGNOSIS — M6281 Muscle weakness (generalized): Secondary | ICD-10-CM | POA: Diagnosis not present

## 2016-04-09 DIAGNOSIS — M6281 Muscle weakness (generalized): Secondary | ICD-10-CM | POA: Diagnosis not present

## 2016-04-10 DIAGNOSIS — M6281 Muscle weakness (generalized): Secondary | ICD-10-CM | POA: Diagnosis not present

## 2016-04-13 DIAGNOSIS — M6281 Muscle weakness (generalized): Secondary | ICD-10-CM | POA: Diagnosis not present

## 2016-04-14 DIAGNOSIS — M6281 Muscle weakness (generalized): Secondary | ICD-10-CM | POA: Diagnosis not present

## 2016-04-15 DIAGNOSIS — M6281 Muscle weakness (generalized): Secondary | ICD-10-CM | POA: Diagnosis not present

## 2016-04-16 ENCOUNTER — Encounter: Payer: Self-pay | Admitting: Adult Health

## 2016-04-16 ENCOUNTER — Non-Acute Institutional Stay (SKILLED_NURSING_FACILITY): Payer: Medicare Other | Admitting: Adult Health

## 2016-04-16 DIAGNOSIS — M1990 Unspecified osteoarthritis, unspecified site: Secondary | ICD-10-CM

## 2016-04-16 DIAGNOSIS — G4709 Other insomnia: Secondary | ICD-10-CM | POA: Diagnosis not present

## 2016-04-16 DIAGNOSIS — J42 Unspecified chronic bronchitis: Secondary | ICD-10-CM | POA: Diagnosis not present

## 2016-04-16 DIAGNOSIS — G629 Polyneuropathy, unspecified: Secondary | ICD-10-CM | POA: Diagnosis not present

## 2016-04-16 DIAGNOSIS — M6281 Muscle weakness (generalized): Secondary | ICD-10-CM | POA: Diagnosis not present

## 2016-04-16 NOTE — Progress Notes (Signed)
Patient ID: Leslie Farley, female   DOB: [birth year], 83 y.o.   MRN: [id number]    DATE:  04/16/2016   MRN:  [id number]  BIRTHDAY: 1931/01/18  Facility:  Nursing Home Location:  [facility]  Nursing Home Room Number: 804-B  LEVEL OF CARE:  SNF [phone])  Contact Information    Name Relation Home Work [place] Daughter [phone]  [phone]   Leslie, Farley Daughter [phone]  [phone]       Code Status History    Date Active Date Inactive Code Status Order ID Comments User Context   10/30/2014  9:41 PM 11/02/2014 10:05 PM Partial Code [id number]  Lorretta Harp, MD Inpatient    Questions for Most Recent Historical Code Status (Order [id number])    Question Answer Comment   In the event of cardiac or respiratory ARREST: Initiate Code Blue, Call Rapid Response Yes    In the event of cardiac or respiratory ARREST: Perform CPR Yes    In the event of cardiac or respiratory ARREST: Perform Intubation/Mechanical Ventilation No    In the event of cardiac or respiratory ARREST: Use NIPPV/BiPAp only if indicated Yes    In the event of cardiac or respiratory ARREST: Administer ACLS medications if indicated Yes    In the event of cardiac or respiratory ARREST: Perform Defibrillation or Cardioversion if indicated Yes        Chief Complaint  Patient presents with  . Medical Management of Chronic Issues    HISTORY OF PRESENT ILLNESS:  This is an 83-YO female seen for a routine visit.  She is a long-term care resident at [facility]. She was recently started on Icy hot patch to right arm for pain.    PAST MEDICAL HISTORY:  Past Medical History:  Diagnosis Date  . Arthritis   . Asthma   . COPD (chronic obstructive pulmonary disease) (HCC)   . GERD (gastroesophageal reflux disease)   . Hypertension      CURRENT MEDICATIONS: Reviewed  Patient's Medications  New Prescriptions   No medications on file  Previous  Medications   ACETAMINOPHEN (TYLENOL) 500 MG TABLET    Take 1,000 mg by mouth every 6 (six) hours as needed for mild pain or moderate pain.    ALBUTEROL (PROVENTIL HFA;VENTOLIN HFA) 108 (90 BASE) MCG/ACT INHALER    Inhale 2 puffs into the lungs every 6 (six) hours as needed for wheezing or shortness of breath.    AMINO ACIDS-PROTEIN HYDROLYS (FEEDING SUPPLEMENT, PRO-STAT SUGAR FREE 64,) LIQD    Take 30 mLs by mouth daily.   BISACODYL (DULCOLAX) 10 MG SUPPOSITORY    Place 10 mg rectally daily as needed for moderate constipation.   BUDESONIDE-FORMOTEROL (SYMBICORT) 160-4.5 MCG/ACT INHALER    Inhale 2 puffs into the lungs 2 (two) times daily.   CARBOXYMETHYLCELLULOSE SODIUM 0.25 % SOLN    Place 1 drop into both eyes 2 (two) times daily.    DEXTROMETHORPHAN-GUAIFENESIN (MUCINEX DM) 30-600 MG 12HR TABLET    Take 1 tablet by mouth 2 (two) times daily.   ESOMEPRAZOLE (NEXIUM) 40 MG CAPSULE    Take 40 mg by mouth daily at 12 noon.    GABAPENTIN (NEURONTIN) 100 MG CAPSULE    Take 100 mg by mouth at bedtime.   HYDROCHLOROTHIAZIDE (MICROZIDE) 12.5 MG CAPSULE    Take 12.5 mg by mouth daily.    LOSARTAN (COZAAR) 50 MG TABLET    Take 50 mg by mouth daily.   MECLIZINE (ANTIVERT)  12.5 MG TABLET    Take 12.5 mg by mouth 3 (three) times daily as needed for dizziness.   MENTHOL (ICY HOT) 5 % PTCH    Apply 1 patch topically every morning. Apply to right upper arm QAM and remove QHS   MENTHOL, TOPICAL ANALGESIC, (BIOFREEZE) 4 % GEL    Apply to neck and right shoulder three times a day for pain.   MONTELUKAST (SINGULAIR) 10 MG TABLET    Take 10 mg by mouth at bedtime.    MULTIPLE VITAMINS-MINERALS (MULTIVITAMIN WITH MINERALS) TABLET    Take 1 tablet by mouth daily.   NITROGLYCERIN (NITROSTAT) 0.4 MG SL TABLET    Place 0.4 mg under the tongue every 5 (five) minutes as needed for chest pain (Do not exceed 3 doses per episode.).   POLYETHYLENE GLYCOL (MIRALAX / GLYCOLAX) PACKET    Take 17 g by mouth daily.    SENNOSIDES-DOCUSATE SODIUM (SENOKOT-S) 8.6-50 MG TABLET    Take 2 tablets by mouth 2 (two) times daily.    TRAMADOL (ULTRAM) 50 MG TABLET    Take one tablet by mouth at bedtime for pain   TRAMADOL (ULTRAM) 50 MG TABLET    Take by mouth every 6 (six) hours as needed.   TRAZODONE (DESYREL) 150 MG TABLET    Take 75 mg by mouth at bedtime. Take 1/2 tablet to = 75 mg qhs  Modified Medications   No medications on file  Discontinued Medications   No medications on file     No Known Allergies   REVIEW OF SYSTEMS:  GENERAL: no change in appetite, no fatigue, no weight changes, no fever, chills or weakness EARS: Denies change in hearing, ringing in ears, or earache NOSE: Denies nasal congestion or epistaxis MOUTH and THROAT: Denies oral discomfort, gingival pain or bleeding, pain from teeth or hoarseness   RESPIRATORY: no cough, SOB, DOE, wheezing, hemoptysis CARDIAC: no chest pain, edema or palpitations GI: no abdominal pain, diarrhea, heart burn, nausea or vomiting, +constipation GU: Denies dysuria, frequency, hematuria, incontinence, or discharge PSYCHIATRIC: Denies feeling of depression or anxiety. No report of hallucinations, insomnia, paranoia, or agitation    PHYSICAL EXAMINATION  GENERAL APPEARANCE: Well nourished. In no acute distress. Obese SKIN:  Skin is warm and dry.  HEAD: Normal in size and contour. No evidence of trauma EYES: Lids open and close normally. No blepharitis, entropion or ectropion. PERRL. Conjunctivae are clear and sclerae are white. Lenses are without opacity EARS: Pinnae are normal. Patient hears normal voice tunes of the examiner MOUTH and THROAT: Lips are without lesions. Oral mucosa is moist and without lesions. Tongue is normal in shape, size, and color and without lesions NECK: supple, trachea midline, no neck masses, no thyroid tenderness, no thyromegaly LYMPHATICS: no LAN in the neck, no supraclavicular LAN RESPIRATORY: breathing is even & unlabored,  BS CTAB CARDIAC: RRR, no murmur,no extra heart sounds, no edema GI: abdomen soft, normal BS, no masses, no tenderness, no hepatomegaly, no splenomegaly EXTREMITIES:  Able to move X 4 extremities; BLE with generalized weakness PSYCHIATRIC: Alert and oriented X 3. Affect and behavior are appropriate    LABS/RADIOLOGY: Labs reviewed: Basic Metabolic Panel:  Recent Labs  16/10/96 11/02/15 01/20/16  NA 139 136* 141  K 4.0 3.8 4.2  BUN 14 14 14   CREATININE 0.9 0.8 0.8   Liver Function Tests:  Recent Labs  09/15/15 10/13/15 01/20/16  AST 14 14 12*  ALT 8 9 7   ALKPHOS 57 52 64   CBC:  Recent Labs  10/13/15 11/02/15 01/20/16  WBC 6.2 6.7 6.3  NEUTROABS 2,976  --   --   HGB 10.5* 11.0* 10.8*  HCT 32* 33* 32*  PLT 230 228 216   Lipid Panel:  Recent Labs  09/15/15 10/13/15  HDL 64 64     ASSESSMENT/PLAN:  Arthritis - right shoulder, right arm and bilateral knees; recently started on icy hot patch to right arm pain, Biofreeze 4% gel to neck and right shoulder twice a day and tramadol 50 mg 1 tab by mouth every 6 hours when necessary  Neuropathy - continue gabapentin 100 mg 1 capsule by mouth daily at bedtime  Insomnia - continue trazodone 150 mg give 1/2 tab = 75 mg by mouth daily at bedtime  Chronic bronchitis - continue Symbicort, Singulair, Proventil and Mucinex      Goals of care:  Long-term care    Monina C. Medina-Vargas - NP [facility] [phone]

## 2016-04-17 DIAGNOSIS — M6281 Muscle weakness (generalized): Secondary | ICD-10-CM | POA: Diagnosis not present

## 2016-04-20 DIAGNOSIS — M6281 Muscle weakness (generalized): Secondary | ICD-10-CM | POA: Diagnosis not present

## 2016-04-21 DIAGNOSIS — M6281 Muscle weakness (generalized): Secondary | ICD-10-CM | POA: Diagnosis not present

## 2016-04-22 DIAGNOSIS — Z7951 Long term (current) use of inhaled steroids: Secondary | ICD-10-CM | POA: Diagnosis not present

## 2016-04-22 DIAGNOSIS — H25813 Combined forms of age-related cataract, bilateral: Secondary | ICD-10-CM | POA: Diagnosis not present

## 2016-04-22 DIAGNOSIS — H04123 Dry eye syndrome of bilateral lacrimal glands: Secondary | ICD-10-CM | POA: Diagnosis not present

## 2016-04-22 DIAGNOSIS — M6281 Muscle weakness (generalized): Secondary | ICD-10-CM | POA: Diagnosis not present

## 2016-04-23 DIAGNOSIS — M6281 Muscle weakness (generalized): Secondary | ICD-10-CM | POA: Diagnosis not present

## 2016-04-24 DIAGNOSIS — M6281 Muscle weakness (generalized): Secondary | ICD-10-CM | POA: Diagnosis not present

## 2016-05-08 ENCOUNTER — Encounter: Payer: Self-pay | Admitting: Internal Medicine

## 2016-05-08 ENCOUNTER — Non-Acute Institutional Stay (SKILLED_NURSING_FACILITY): Payer: Medicare Other | Admitting: Internal Medicine

## 2016-05-08 DIAGNOSIS — K219 Gastro-esophageal reflux disease without esophagitis: Secondary | ICD-10-CM

## 2016-05-08 DIAGNOSIS — R21 Rash and other nonspecific skin eruption: Secondary | ICD-10-CM | POA: Diagnosis not present

## 2016-05-08 DIAGNOSIS — M19011 Primary osteoarthritis, right shoulder: Secondary | ICD-10-CM

## 2016-05-08 DIAGNOSIS — I10 Essential (primary) hypertension: Secondary | ICD-10-CM | POA: Diagnosis not present

## 2016-05-08 DIAGNOSIS — K5909 Other constipation: Secondary | ICD-10-CM

## 2016-05-08 NOTE — Progress Notes (Signed)
LOCATION: [facility]  PCP: Marga Melnick, MD   Code Status: Full Code  Goals of care: Advanced Directive information Advanced Directives 01/14/2016  Does Leslie Farley Have a Medical Advance Directive? No  Would Leslie Farley like information on creating a medical advance directive? -       Extended Emergency Contact Information Primary Emergency Contact: Guillaume,Nathalie Address: [address] [place], [place] [address] [place] Home Phone: [phone] Mobile Phone: [phone] Relation: Daughter Secondary Emergency Contact: Cammie Mcgee, [place] Home Phone: [phone] Mobile Phone: [phone] Relation: Daughter   No Known Allergies  Chief Complaint  Leslie Farley presents with  . Medical Management of Chronic Issues    Routine Visit      HPI:  Leslie Farley is a 83 y.o. female seen today for routine visit. She complaints of itching to right arm x 1 day. denies any new product in contact with this area. Denies pain. Denies itching or rash elsewhere. She has been at her baseline and denies any other concern this visit. She is out of bed daily. She feeds herself. She needs assistance with her other ADLS.   Review of Systems:  Constitutional: Negative for fever, chills, diaphoresis.  HENT: Negative for headache, congestion, nasal discharge, sore throat, difficulty swallowing.   Eyes: Negative for blurred vision, double vision and discharge.  Respiratory: Negative for shortness of breath, cough and wheezing.  Cardiovascular: Negative for chest pain, palpitations, leg swelling.  Gastrointestinal: Negative for heartburn, nausea, vomiting, abdominal pain. She has regular bowel movement Genitourinary: Negative for dysuria Musculoskeletal: Negative for back pain, fall. positive for chronic right shoulder pain.  Skin: Negative for itching, rash.  Neurological: Negative for dizziness. Psychiatric/Behavioral:  Negative for depression.   Past Medical History:  Diagnosis Date  . Arthritis   . Asthma   . COPD (chronic obstructive pulmonary disease) (HCC)   . GERD (gastroesophageal reflux disease)   . Hypertension    Past Surgical History:  Procedure Laterality Date  . CHOLECYSTECTOMY     Social History:   reports that she quit smoking about 45 years ago. She has never used smokeless tobacco. She reports that she does not drink alcohol or use drugs.  Family History  Problem Relation Age of Onset  . Cancer - Other Mother     Died of throat cancer  . Cancer - Prostate Father   . Diabetes Brother     Medications: Allergies as of 05/08/2016   No Known Allergies     Medication List       Accurate as of 05/08/16  3:10 PM. Always use your most recent med list.          acetaminophen 500 MG tablet Commonly known as:  TYLENOL Take 1,000 mg by mouth every 6 (six) hours as needed for mild pain or moderate pain.   albuterol 108 (90 Base) MCG/ACT inhaler Commonly known as:  PROVENTIL HFA;VENTOLIN HFA Inhale 2 puffs into the lungs every 6 (six) hours as needed for wheezing or shortness of breath.   BIOFREEZE 4 % Gel Generic drug:  Menthol (Topical Analgesic) Apply to neck and right shoulder three times a day for pain.   ICY HOT 5 % Ptch Generic drug:  Menthol Apply 1 patch topically every morning. Apply to right upper arm QAM and remove QHS   bisacodyl 10 MG suppository Commonly known as:  DULCOLAX  Place 10 mg rectally daily as needed for moderate constipation.   budesonide-formoterol 160-4.5 MCG/ACT inhaler Commonly known as:  SYMBICORT Inhale 2 puffs into the lungs 2 (two) times daily.   Carboxymethylcellulose Sodium 0.25 % Soln Place 1 drop into both eyes 2 (two) times daily.   dextromethorphan-guaiFENesin 30-600 MG 12hr tablet Commonly known as:  MUCINEX DM Take 1 tablet by mouth 2 (two) times daily.   esomeprazole 40 MG capsule Commonly known as:  NEXIUM Take 40 mg  by mouth daily at 12 noon.   feeding supplement (PRO-STAT SUGAR FREE 64) Liqd Take 30 mLs by mouth daily.   gabapentin 100 MG capsule Commonly known as:  NEURONTIN Take 100 mg by mouth at bedtime.   hydrochlorothiazide 12.5 MG capsule Commonly known as:  MICROZIDE Take 12.5 mg by mouth daily.   losartan 50 MG tablet Commonly known as:  COZAAR Take 50 mg by mouth daily.   meclizine 12.5 MG tablet Commonly known as:  ANTIVERT Take 12.5 mg by mouth 3 (three) times daily as needed for dizziness.   montelukast 10 MG tablet Commonly known as:  SINGULAIR Take 10 mg by mouth at bedtime.   multivitamin with minerals tablet Take 1 tablet by mouth daily.   nitroGLYCERIN 0.4 MG SL tablet Commonly known as:  NITROSTAT Place 0.4 mg under the tongue every 5 (five) minutes as needed for chest pain (Do not exceed 3 doses per episode.).   polyethylene glycol packet Commonly known as:  MIRALAX / GLYCOLAX Take 17 g by mouth 2 (two) times daily.   sennosides-docusate sodium 8.6-50 MG tablet Commonly known as:  SENOKOT-S Take 2 tablets by mouth 2 (two) times daily.   traMADol 50 MG tablet Commonly known as:  ULTRAM Take by mouth every 6 (six) hours as needed.   traMADol 50 MG tablet Commonly known as:  ULTRAM Take one tablet by mouth at bedtime for pain   traZODone 150 MG tablet Commonly known as:  DESYREL Take 75 mg by mouth at bedtime. Take 1/2 tablet to = 75 mg qhs       Immunizations: Immunization History  Administered Date(s) Administered  . Influenza,inj,Quad PF,36+ Mos 10/31/2014  . Influenza-Unspecified 11/29/2015  . PPD Test 11/02/2014, 01/14/2016, 01/28/2016     Physical Exam:  Vitals:   05/08/16 1505  BP: (!) 160/59  Pulse: 74  Resp: 16  Temp: 97.2 F (36.2 C)  TempSrc: Oral  SpO2: 100%  Weight: 205 lb 6.4 oz (93.2 kg)  Height: 5\' 5"  (1.651 m)   Body mass index is 34.18 kg/m.  General- elderly female, obese, in no acute distress Head-  normocephalic, atraumatic Nose- no nasal discharge Throat- moist mucus membrane Eyes- PERRLA, EOMI, no pallor, no icterus, no discharge, normal conjunctiva, normal sclera Neck- no cervical lymphadenopathy Cardiovascular- normal s1,s2, no murmur, trace leg edema Respiratory- bilateral clear to auscultation, no wheeze, no rhonchi, no crackles, no use of accessory muscles Abdomen- bowel sounds present, soft, non tender, no guarding or rigidity Musculoskeletal- able to move all 4 extremities, on wheelchair, limited right shoulder ROM right > left Neurological- alert and oriented to person, place and time Skin- warm and dry, right arm erythematous area with complaints of itching on exam, no vesicles or lesion noted, non tender, no signs of infection Psychiatry- normal mood and affect    Labs reviewed: Basic Metabolic Panel:  Recent Labs  16/10/96 11/02/15 01/20/16  NA 139 136* 141  K 4.0 3.8 4.2  BUN 14 14 14   CREATININE  0.9 0.8 0.8   Liver Function Tests:  Recent Labs  09/15/15 10/13/15 01/20/16  AST 14 14 12*  ALT 8 9 7   ALKPHOS 57 52 64   No results for input(s): LIPASE, AMYLASE in the last 8760 hours. No results for input(s): AMMONIA in the last 8760 hours. CBC:  Recent Labs  10/13/15 11/02/15 01/20/16  WBC 6.2 6.7 6.3  NEUTROABS 2,976  --   --   HGB 10.5* 11.0* 10.8*  HCT 32* 33* 32*  PLT 230 228 216      Assessment/Plan  Rash Appears to be contact dermatitis to right arm. No signs of infection noted. Clean area and apply triamcinolone 0.1% tid to affected area x 1 week.   HTN Currently on losartan 50 mg daily and HCTZ 12.5 mg daily, reviewed BP and BP reading at goal with few low reading. Discontinue hctz for now and monitor BP. check cmp  Chronic constipation On senna s 2 tab bid and miralax bid with prn dulcolax. Hydration to be maintained. Monitor.   Shoulder OA Continue biofreeze gel tid and icy hot patch. Continue tramadol 50 mg qhs and 50 mg q6h prn  pain  gerd Stable, continue nexium 40 mg daily    Labs/tests ordered: cbc cmp  Family/ staff Communication: reviewed care plan with Leslie Farley and nursing supervisor    Oneal GroutMAHIMA Paislei Dorval, MD Internal Medicine [facility] [address] [place], [place] [address] Cell Phone (Monday-Friday 8 am - 5 pm): [phone] On Call: [phone] and follow prompts after 5 pm and on weekends Office Phone: [phone] Office Fax: [phone]

## 2016-05-11 DIAGNOSIS — Z79899 Other long term (current) drug therapy: Secondary | ICD-10-CM | POA: Diagnosis not present

## 2016-05-11 DIAGNOSIS — D649 Anemia, unspecified: Secondary | ICD-10-CM | POA: Diagnosis not present

## 2016-05-15 DIAGNOSIS — M6281 Muscle weakness (generalized): Secondary | ICD-10-CM | POA: Diagnosis not present

## 2016-05-18 DIAGNOSIS — M6281 Muscle weakness (generalized): Secondary | ICD-10-CM | POA: Diagnosis not present

## 2016-05-19 DIAGNOSIS — M6281 Muscle weakness (generalized): Secondary | ICD-10-CM | POA: Diagnosis not present

## 2016-05-20 DIAGNOSIS — M6281 Muscle weakness (generalized): Secondary | ICD-10-CM | POA: Diagnosis not present

## 2016-05-21 DIAGNOSIS — M6281 Muscle weakness (generalized): Secondary | ICD-10-CM | POA: Diagnosis not present

## 2016-05-22 DIAGNOSIS — M6281 Muscle weakness (generalized): Secondary | ICD-10-CM | POA: Diagnosis not present

## 2016-05-25 DIAGNOSIS — I10 Essential (primary) hypertension: Secondary | ICD-10-CM | POA: Diagnosis not present

## 2016-05-25 LAB — BASIC METABOLIC PANEL
BUN: 12 mg/dL (ref 4–21)
CREATININE: 0.8 mg/dL (ref 0.5–1.1)
GLUCOSE: 86 mg/dL
Potassium: 4.1 mmol/L (ref 3.4–5.3)
SODIUM: 141 mmol/L (ref 137–147)

## 2016-05-26 DIAGNOSIS — M6281 Muscle weakness (generalized): Secondary | ICD-10-CM | POA: Diagnosis not present

## 2016-05-27 DIAGNOSIS — M6281 Muscle weakness (generalized): Secondary | ICD-10-CM | POA: Diagnosis not present

## 2016-05-28 DIAGNOSIS — M6281 Muscle weakness (generalized): Secondary | ICD-10-CM | POA: Diagnosis not present

## 2016-05-29 DIAGNOSIS — M6281 Muscle weakness (generalized): Secondary | ICD-10-CM | POA: Diagnosis not present

## 2016-06-01 DIAGNOSIS — M6281 Muscle weakness (generalized): Secondary | ICD-10-CM | POA: Diagnosis not present

## 2016-06-02 ENCOUNTER — Non-Acute Institutional Stay (SKILLED_NURSING_FACILITY): Payer: Medicare Other | Admitting: Adult Health

## 2016-06-02 ENCOUNTER — Encounter: Payer: Self-pay | Admitting: Adult Health

## 2016-06-02 DIAGNOSIS — I2510 Atherosclerotic heart disease of native coronary artery without angina pectoris: Secondary | ICD-10-CM | POA: Diagnosis not present

## 2016-06-02 DIAGNOSIS — K5909 Other constipation: Secondary | ICD-10-CM

## 2016-06-02 DIAGNOSIS — G629 Polyneuropathy, unspecified: Secondary | ICD-10-CM

## 2016-06-02 DIAGNOSIS — M6281 Muscle weakness (generalized): Secondary | ICD-10-CM | POA: Diagnosis not present

## 2016-06-02 DIAGNOSIS — I10 Essential (primary) hypertension: Secondary | ICD-10-CM | POA: Diagnosis not present

## 2016-06-02 NOTE — Progress Notes (Signed)
DATE:  06/02/2016   MRN:  [id number]  BIRTHDAY: Apr 03, 1930  Facility:  Nursing Home Location:  [facility]  Nursing Home Room Number: 1104-A  LEVEL OF CARE:  SNF [phone])  Contact Information    Name Relation Home Work [place] Daughter [phone]  [phone]   Ellicia, Alix Daughter [phone]  [phone]       Code Status History    Date Active Date Inactive Code Status Order ID Comments User Context   10/30/2014  9:41 PM 11/02/2014 10:05 PM Partial Code [id number]  Lorretta Harp, MD Inpatient    Questions for Most Recent Historical Code Status (Order [id number])    Question Answer Comment   In the event of cardiac or respiratory ARREST: Initiate Code Blue, Call Rapid Response Yes    In the event of cardiac or respiratory ARREST: Perform CPR Yes    In the event of cardiac or respiratory ARREST: Perform Intubation/Mechanical Ventilation No    In the event of cardiac or respiratory ARREST: Use NIPPV/BiPAp only if indicated Yes    In the event of cardiac or respiratory ARREST: Administer ACLS medications if indicated Yes    In the event of cardiac or respiratory ARREST: Perform Defibrillation or Cardioversion if indicated Yes        Chief Complaint  Patient presents with  . Medical Management of Chronic Issues    HISTORY OF PRESENT ILLNESS:  This is an 85-YO female seen for a routine visit.  She is a long-term care resident of [facility].  HCTZ dosage was recently increased from 12.5 mg to 25 mg daily. BPs has been stable - 137/58, 141/53, 141/77, 138/80, 145/80.    PAST MEDICAL HISTORY:  Past Medical History:  Diagnosis Date  . Arthritis   . Asthma   . COPD (chronic obstructive pulmonary disease) (HCC)   . GERD (gastroesophageal reflux disease)   . Hypertension      CURRENT MEDICATIONS: Reviewed  Patient's Medications  New Prescriptions   No medications on file  Previous Medications   ACETAMINOPHEN  (TYLENOL) 500 MG TABLET    Take 1,000 mg by mouth every 6 (six) hours as needed for mild pain or moderate pain.    ALBUTEROL (PROVENTIL HFA;VENTOLIN HFA) 108 (90 BASE) MCG/ACT INHALER    Inhale 2 puffs into the lungs every 6 (six) hours as needed for wheezing or shortness of breath.    BISACODYL (DULCOLAX) 10 MG SUPPOSITORY    Place 10 mg rectally daily as needed for moderate constipation.   BUDESONIDE-FORMOTEROL (SYMBICORT) 160-4.5 MCG/ACT INHALER    Inhale 2 puffs into the lungs 2 (two) times daily.   CARBOXYMETHYLCELLULOSE SODIUM 0.25 % SOLN    Place 1 drop into both eyes 2 (two) times daily.    DEXTROMETHORPHAN-GUAIFENESIN (MUCINEX DM) 30-600 MG 12HR TABLET    Take 1 tablet by mouth 2 (two) times daily.   ESOMEPRAZOLE (NEXIUM) 40 MG CAPSULE    Take 40 mg by mouth daily at 12 noon.    GABAPENTIN (NEURONTIN) 100 MG CAPSULE    Take 100 mg by mouth at bedtime.    HYDROCHLOROTHIAZIDE (HYDRODIURIL) 25 MG TABLET    Take 25 mg by mouth daily.   LOSARTAN (COZAAR) 50 MG TABLET    Take 50 mg by mouth daily.   MECLIZINE (ANTIVERT) 12.5 MG TABLET    Take 12.5 mg by mouth 3 (three) times daily as needed for dizziness.   MENTHOL (ICY HOT) 5 % PTCH  Apply 1 patch topically every morning. Apply to right upper arm QAM and remove QHS   MENTHOL, TOPICAL ANALGESIC, (BIOFREEZE) 4 % GEL    Apply to neck and right shoulder three times a day for pain.   MONTELUKAST (SINGULAIR) 10 MG TABLET    Take 10 mg by mouth at bedtime.    MULTIPLE VITAMINS-MINERALS (MULTIVITAMIN WITH MINERALS) TABLET    Take 1 tablet by mouth daily.   NITROGLYCERIN (NITROSTAT) 0.4 MG SL TABLET    Place 0.4 mg under the tongue every 5 (five) minutes as needed for chest pain (Do not exceed 3 doses per episode.).   POLYETHYLENE GLYCOL (MIRALAX / GLYCOLAX) PACKET    Take 17 g by mouth 2 (two) times daily.    SENNOSIDES-DOCUSATE SODIUM (SENOKOT-S) 8.6-50 MG TABLET    Take 2 tablets by mouth 2 (two) times daily as needed.    TRAMADOL (ULTRAM) 50 MG  TABLET    Take one tablet by mouth at bedtime for pain   TRAMADOL (ULTRAM) 50 MG TABLET    Take by mouth every 6 (six) hours as needed.   TRAZODONE (DESYREL) 150 MG TABLET    Take 75 mg by mouth at bedtime. Take 1/2 tablet to = 75 mg qhs  Modified Medications   No medications on file  Discontinued Medications   AMINO ACIDS-PROTEIN HYDROLYS (FEEDING SUPPLEMENT, PRO-STAT SUGAR FREE 64,) LIQD    Take 30 mLs by mouth daily.   HYDROCHLOROTHIAZIDE (MICROZIDE) 12.5 MG CAPSULE    Take 12.5 mg by mouth daily.      No Known Allergies   REVIEW OF SYSTEMS:  GENERAL: no change in appetite, no fatigue, no weight changes, no fever, chills or weakness EYES: Denies change in vision, dry eyes, eye pain, itching or discharge EARS: Denies change in hearing, ringing in ears, or earache NOSE: Denies nasal congestion or epistaxis MOUTH and THROAT: Denies oral discomfort, gingival pain or bleeding, pain from teeth or hoarseness   RESPIRATORY: no cough, SOB, DOE, wheezing, hemoptysis CARDIAC: no chest pain, edema or palpitations GI: no abdominal pain, diarrhea, constipation, heart burn, nausea or vomiting GU: Denies dysuria, frequency, hematuria, incontinence, or discharge PSYCHIATRIC: Denies feeling of depression or anxiety. No report of hallucinations, insomnia, paranoia, or agitation    PHYSICAL EXAMINATION  GENERAL APPEARANCE: Well nourished. In no acute distress. Obese SKIN:  Skin is warm and dry.  HEAD: Normal in size and contour. No evidence of trauma EYES: Lids open and close normally. No blepharitis, entropion or ectropion. PERRL. Conjunctivae are clear and sclerae are white. Lenses are without opacity EARS: Pinnae are normal. Patient hears normal voice tunes of the examiner MOUTH and THROAT: Lips are without lesions. Oral mucosa is moist and without lesions. Tongue is normal in shape, size, and color and without lesions NECK: supple, trachea midline, no neck masses, no thyroid tenderness, no  thyromegaly LYMPHATICS: no LAN in the neck, no supraclavicular LAN RESPIRATORY: breathing is even & unlabored, BS CTAB CARDIAC: RRR, no murmur,no extra heart sounds, no edema GI: abdomen soft, normal BS, no masses, no tenderness, no hepatomegaly, no splenomegaly EXTREMITIES:  Able to move X 4 extremities, BLE generalized weakness PSYCHIATRIC: Alert and oriented X 3. Affect and behavior are appropriate   LABS/RADIOLOGY: Labs reviewed: Basic Metabolic Panel:  Recent Labs  60/45/40 01/20/16 05/25/16  NA 136* 141 141  K 3.8 4.2 4.1  BUN CREATININE 0.8 0.8 0.8   Liver Function Tests:  Recent Labs  09/15/15 10/13/15 01/20/16  AST 14 14 12*  ALT ALKPHOS 57 52 64   CBC:  Recent Labs  10/13/15 11/02/15 01/20/16  WBC 6.2 6.7 6.3  NEUTROABS 2,976  --   --   HGB 10.5* 11.0* 10.8*  HCT 32* 33* 32*  PLT 230 228 216   Lipid Panel:  Recent Labs  09/15/15 10/13/15  HDL 64 64    ASSESSMENT/PLAN:  Hypertension - stable; recently increased dosage of HCTZ from 12.5 mg to 25 mg daily; check BMP  CAD - no complaints of chest pain; continue NTG PRN  Chronic constipation - continue Miralax 17 gm PO BID, Dulcolax 10 mg suppository rectally QD PRN and Senna-S 2 tabs PO BID was recently changed to PRN  Neuropathy - continue Gabapentin 100 mg 1 capsule PO Q HS     Goals of care:  Long-term care    Buelah Rennie C. Medina-Vargas - NP   [facility] [phone]

## 2016-06-03 DIAGNOSIS — I10 Essential (primary) hypertension: Secondary | ICD-10-CM | POA: Diagnosis not present

## 2016-06-03 DIAGNOSIS — M6281 Muscle weakness (generalized): Secondary | ICD-10-CM | POA: Diagnosis not present

## 2016-06-03 LAB — BASIC METABOLIC PANEL
BUN: 15 mg/dL (ref 4–21)
CREATININE: 0.8 mg/dL (ref 0.5–1.1)
GLUCOSE: 82 mg/dL
POTASSIUM: 4.4 mmol/L (ref 3.4–5.3)
Sodium: 138 mmol/L (ref 137–147)

## 2016-06-04 DIAGNOSIS — M6281 Muscle weakness (generalized): Secondary | ICD-10-CM | POA: Diagnosis not present

## 2016-06-05 DIAGNOSIS — M6281 Muscle weakness (generalized): Secondary | ICD-10-CM | POA: Diagnosis not present

## 2016-06-08 DIAGNOSIS — M6281 Muscle weakness (generalized): Secondary | ICD-10-CM | POA: Diagnosis not present

## 2016-06-09 DIAGNOSIS — M6281 Muscle weakness (generalized): Secondary | ICD-10-CM | POA: Diagnosis not present

## 2016-06-10 DIAGNOSIS — M6281 Muscle weakness (generalized): Secondary | ICD-10-CM | POA: Diagnosis not present

## 2016-06-11 DIAGNOSIS — M6281 Muscle weakness (generalized): Secondary | ICD-10-CM | POA: Diagnosis not present

## 2016-06-12 DIAGNOSIS — M6281 Muscle weakness (generalized): Secondary | ICD-10-CM | POA: Diagnosis not present

## 2016-06-15 DIAGNOSIS — M6281 Muscle weakness (generalized): Secondary | ICD-10-CM | POA: Diagnosis not present

## 2016-06-17 DIAGNOSIS — G4700 Insomnia, unspecified: Secondary | ICD-10-CM | POA: Diagnosis not present

## 2016-06-17 DIAGNOSIS — F339 Major depressive disorder, recurrent, unspecified: Secondary | ICD-10-CM | POA: Diagnosis not present

## 2016-07-01 ENCOUNTER — Encounter: Payer: Self-pay | Admitting: Adult Health

## 2016-07-01 ENCOUNTER — Non-Acute Institutional Stay (SKILLED_NURSING_FACILITY): Payer: Medicare Other | Admitting: Adult Health

## 2016-07-01 DIAGNOSIS — R42 Dizziness and giddiness: Secondary | ICD-10-CM

## 2016-07-01 DIAGNOSIS — L299 Pruritus, unspecified: Secondary | ICD-10-CM | POA: Diagnosis not present

## 2016-07-01 DIAGNOSIS — J42 Unspecified chronic bronchitis: Secondary | ICD-10-CM | POA: Diagnosis not present

## 2016-07-01 DIAGNOSIS — M79604 Pain in right leg: Secondary | ICD-10-CM

## 2016-07-01 DIAGNOSIS — G4700 Insomnia, unspecified: Secondary | ICD-10-CM | POA: Diagnosis not present

## 2016-07-01 DIAGNOSIS — K219 Gastro-esophageal reflux disease without esophagitis: Secondary | ICD-10-CM | POA: Diagnosis not present

## 2016-07-01 DIAGNOSIS — M79605 Pain in left leg: Secondary | ICD-10-CM

## 2016-07-01 NOTE — Progress Notes (Signed)
DATE:  07/01/2016   MRN:  [id number]  BIRTHDAY: 1930-05-19  Facility:  Nursing Home Location:  [facility]  Nursing Home Room Number: 1104-A  LEVEL OF CARE:  SNF [phone])  Contact Information    Name Relation Home Work [employer]   Guillaume,Nathalie Daughter [phone]  [phone]   Lane HackerGuillaume,Azalia Daughter [phone]  [phone]       Code Status History    Date Active Date Inactive Code Status Order ID Comments User Context   10/30/2014  9:41 PM 11/02/2014 10:05 PM Partial Code [id number]  Lorretta HarpNiu, Xilin, MD Inpatient    Questions for Most Recent Historical Code Status (Order [id number])    Question Answer Comment   In the event of cardiac or respiratory ARREST: Initiate Code Blue, Call Rapid Response Yes    In the event of cardiac or respiratory ARREST: Perform CPR Yes    In the event of cardiac or respiratory ARREST: Perform Intubation/Mechanical Ventilation No    In the event of cardiac or respiratory ARREST: Use NIPPV/BiPAp only if indicated Yes    In the event of cardiac or respiratory ARREST: Administer ACLS medications if indicated Yes    In the event of cardiac or respiratory ARREST: Perform Defibrillation or Cardioversion if indicated Yes        Chief Complaint  Patient presents with  . Medical Management of Chronic Issues    HISTORY OF PRESENT ILLNESS:  This is an 85-YO female seen for a routine visit.  She is a long-term care [facility]. She was seen in the room today and complained of bilateral feet hurting. No discoloration noted but has BLE edema 1+. She was recently started on Atarax for pruritus.    PAST MEDICAL HISTORY:  Past Medical History:  Diagnosis Date  . Arthritis   . Asthma   . COPD (chronic obstructive pulmonary disease) (HCC)   . GERD (gastroesophageal reflux disease)   . Hypertension      CURRENT MEDICATIONS: Reviewed  Patient's Medications  New Prescriptions   No medications on file   Previous Medications   ACETAMINOPHEN (TYLENOL) 500 MG TABLET    Take 1,000 mg by mouth every 6 (six) hours as needed for mild pain or moderate pain.    ALBUTEROL (PROVENTIL HFA;VENTOLIN HFA) 108 (90 BASE) MCG/ACT INHALER    Inhale 2 puffs into the lungs every 6 (six) hours as needed for wheezing or shortness of breath.    BISACODYL (DULCOLAX) 10 MG SUPPOSITORY    Place 10 mg rectally daily as needed for moderate constipation.   BUDESONIDE-FORMOTEROL (SYMBICORT) 160-4.5 MCG/ACT INHALER    Inhale 2 puffs into the lungs 2 (two) times daily.   CARBOXYMETHYLCELLULOSE SODIUM 0.25 % SOLN    Place 1 drop into both eyes 2 (two) times daily.    DEXTROMETHORPHAN-GUAIFENESIN (MUCINEX DM) 30-600 MG 12HR TABLET    Take 1 tablet by mouth 2 (two) times daily.   ESOMEPRAZOLE (NEXIUM) 40 MG CAPSULE    Take 40 mg by mouth daily at 12 noon.    GABAPENTIN (NEURONTIN) 100 MG CAPSULE    Take 100 mg by mouth at bedtime.    HYDROCHLOROTHIAZIDE (HYDRODIURIL) 25 MG TABLET    Take 25 mg by mouth daily.   HYDROXYZINE (ATARAX/VISTARIL) 25 MG TABLET    Take 25 mg by mouth 2 (two) times daily as needed for itching.   LOSARTAN (COZAAR) 50 MG TABLET    Take 50 mg by mouth daily.  MECLIZINE (ANTIVERT) 12.5 MG TABLET    Take 12.5 mg by mouth 3 (three) times daily as needed for dizziness.   MENTHOL (ICY HOT) 5 % PTCH    Apply 1 patch topically every morning. Apply to right upper arm and bilateral knees QAM and remove QHS   MENTHOL, TOPICAL ANALGESIC, (BIOFREEZE) 4 % GEL    Apply to neck and right shoulder three times a day for pain.   MONTELUKAST (SINGULAIR) 10 MG TABLET    Take 10 mg by mouth at bedtime.    MULTIPLE VITAMINS-MINERALS (MULTIVITAMIN WITH MINERALS) TABLET    Take 1 tablet by mouth daily.   NITROGLYCERIN (NITROSTAT) 0.4 MG SL TABLET    Place 0.4 mg under the tongue every 5 (five) minutes as needed for chest pain (Do not exceed 3 doses per episode.).   POLYETHYLENE GLYCOL (MIRALAX / GLYCOLAX) PACKET    Take 17 g by  mouth 2 (two) times daily.    SENNOSIDES-DOCUSATE SODIUM (SENOKOT-S) 8.6-50 MG TABLET    Take 2 tablets by mouth 2 (two) times daily as needed.    TRAMADOL (ULTRAM) 50 MG TABLET    Take one tablet by mouth at bedtime for pain   TRAMADOL (ULTRAM) 50 MG TABLET    Take by mouth every 6 (six) hours as needed.   TRAZODONE (DESYREL) 150 MG TABLET    Take 75 mg by mouth at bedtime. Take 1/2 tablet to = 75 mg qhs  Modified Medications   No medications on file  Discontinued Medications   No medications on file     No Known Allergies   REVIEW OF SYSTEMS:  GENERAL: no change in appetite, no fatigue, no weight changes, no fever, chills or weakness EYES: Denies change in vision, dry eyes, eye pain, itching or discharge EARS: Denies change in hearing, ringing in ears, or earache NOSE: Denies nasal congestion or epistaxis MOUTH and THROAT: Denies oral discomfort, gingival pain or bleeding, pain from teeth or hoarseness   RESPIRATORY: no cough, SOB, DOE, wheezing, hemoptysis CARDIAC: no chest pain or palpitations GI: no abdominal pain, diarrhea, constipation, heart burn, nausea or vomiting GU: Denies dysuria, frequency, hematuria, incontinence, or discharge PSYCHIATRIC: Denies feeling of depression or anxiety. No report of hallucinations, insomnia, paranoia, or agitation   PHYSICAL EXAMINATION  GENERAL APPEARANCE: Well nourished. In no acute distress. Obese SKIN:  Skin is warm and dry.  HEAD: Normal in size and contour. No evidence of trauma EYES: Lids open and close normally. No blepharitis, entropion or ectropion. PERRL. Conjunctivae are clear and sclerae are white. Lenses are without opacity EARS: Pinnae are normal. Patient hears normal voice tunes of the examiner MOUTH and THROAT: Lips are without lesions. Oral mucosa is moist and without lesions. Tongue is normal in shape, size, and color and without lesions NECK: supple, trachea midline, no neck masses, no thyroid tenderness, no  thyromegaly LYMPHATICS: no LAN in the neck, no supraclavicular LAN RESPIRATORY: breathing is even & unlabored, BS CTAB CARDIAC: RRR, no murmur,no extra heart sounds, no edema GI: abdomen soft, normal BS, no masses, no tenderness, no hepatomegaly, no splenomegaly EXTREMITIES:  Able to move X 4 extremities, BLE with generalized weakness, uses wheelchair for ambulation PSYCHIATRIC: Alert and oriented X 3. Affect and behavior are appropriate   LABS/RADIOLOGY: Labs reviewed: 05/11/16   WBC 5.9 hemoglobin 11.3 hematocrit 34.1 MCV 95.3 platelet 221 Basic Metabolic Panel:  Recent Labs  16/10/96 05/25/16 06/03/16  NA 141 141 138  K 4.2 4.1 4.4  BUN 14  12 15  CREATININE 0.8 0.8 0.8   Liver Function Tests:  Recent Labs  09/15/15 10/13/15 01/20/16  AST 14 14 12*  ALT 8 9 7   ALKPHOS 57 52 64   CBC:  Recent Labs  10/13/15 11/02/15 01/20/16  WBC 6.2 6.7 6.3  NEUTROABS 2,976  --   --   HGB 10.5* 11.0* 10.8*  HCT 32* 33* 32*  PLT 230 228 216   Lipid Panel:  Recent Labs  09/15/15 10/13/15  HDL 64 64    ASSESSMENT/PLAN:  Pruritus - recently started on Atarax 25 mg 1 tab by mouth twice a day when necessary, keep skin clean and dry  GERD - continue Nexium 40 mg 1 capsule by mouth daily @ noon  Insomnia - continue trazodone 150 mg by mouth daily at bedtime  Vertigo - continue meclizine 12.5 mg by mouth 3 times a day  COPD - no SOB; continue Symbicort 160-4.5 g inhaler 2 puffs by mouth twice a day and Proventil HFA 90 g inhaler 2 puffs interlocks every 6 hours when necessary  BLE pain - get venous ultrasound to rule out DVT     Goals of care:  Long-term care    Leslie Farley - NP    [facility] [phone]

## 2016-07-03 DIAGNOSIS — M79605 Pain in left leg: Secondary | ICD-10-CM | POA: Diagnosis not present

## 2016-07-03 DIAGNOSIS — R600 Localized edema: Secondary | ICD-10-CM | POA: Diagnosis not present

## 2016-07-03 DIAGNOSIS — M79604 Pain in right leg: Secondary | ICD-10-CM | POA: Diagnosis not present

## 2016-07-21 ENCOUNTER — Non-Acute Institutional Stay (SKILLED_NURSING_FACILITY): Payer: Medicare Other

## 2016-07-21 DIAGNOSIS — Z0000 Encounter for general adult medical examination without abnormal findings: Secondary | ICD-10-CM | POA: Diagnosis not present

## 2016-07-21 NOTE — Patient Instructions (Signed)
Leslie Farley , Thank you for taking time to come for your Medicare Wellness Visit. I appreciate your ongoing commitment to your health goals. Please review the following plan we discussed and let me know if I can assist you in the future.   Screening recommendations/referrals: Colonoscopy excluded, pt over age 81 Mammogram excluded, pt over age 81 Bone Density up to date Recommended yearly ophthalmology/optometry visit for glaucoma screening and checkup Recommended yearly dental visit for hygiene and checkup  Vaccinations: Influenza vaccine due 11/28/2016 Pneumococcal vaccine due, PNA 13 ordered Tdap vaccine not in records Shingles vaccine not in records  Advanced directives: Need a copy for the chart  Conditions/risks identified: None  Next appointment: None upcoming   Preventive Care 65 Years and Older, Female Preventive care refers to lifestyle choices and visits with your health care provider that can promote health and wellness. What does preventive care include?  A yearly physical exam. This is also called an annual well check.  Dental exams once or twice a year.  Routine eye exams. Ask your health care provider how often you should have your eyes checked.  Personal lifestyle choices, including:  Daily care of your teeth and gums.  Regular physical activity.  Eating a healthy diet.  Avoiding tobacco and drug use.  Limiting alcohol use.  Practicing safe sex.  Taking low-dose aspirin every day.  Taking vitamin and mineral supplements as recommended by your health care provider. What happens during an annual well check? The services and screenings done by your health care provider during your annual well check will depend on your age, overall health, lifestyle risk factors, and family history of disease. Counseling  Your health care provider may ask you questions about your:  Alcohol use.  Tobacco use.  Drug use.  Emotional well-being.  Home and  relationship well-being.  Sexual activity.  Eating habits.  History of falls.  Memory and ability to understand (cognition).  Work and work Astronomerenvironment.  Reproductive health. Screening  You may have the following tests or measurements:  Height, weight, and BMI.  Blood pressure.  Lipid and cholesterol levels. These may be checked every 5 years, or more frequently if you are over 81 years old.  Skin check.  Lung cancer screening. You may have this screening every year starting at age 455 if you have a 30-pack-year history of smoking and currently smoke or have quit within the past 15 years.  Fecal occult blood test (FOBT) of the stool. You may have this test every year starting at age 81.  Flexible sigmoidoscopy or colonoscopy. You may have a sigmoidoscopy every 5 years or a colonoscopy every 10 years starting at age 81.  Hepatitis C blood test.  Hepatitis B blood test.  Sexually transmitted disease (STD) testing.  Diabetes screening. This is done by checking your blood sugar (glucose) after you have not eaten for a while (fasting). You may have this done every 1-3 years.  Bone density scan. This is done to screen for osteoporosis. You may have this done starting at age 81.  Mammogram. This may be done every 1-2 years. Talk to your health care provider about how often you should have regular mammograms. Talk with your health care provider about your test results, treatment options, and if necessary, the need for more tests. Vaccines  Your health care provider may recommend certain vaccines, such as:  Influenza vaccine. This is recommended every year.  Tetanus, diphtheria, and acellular pertussis (Tdap, Td) vaccine. You may need a  Td booster every 10 years.  Zoster vaccine. You may need this after age 50.  Pneumococcal 13-valent conjugate (PCV13) vaccine. One dose is recommended after age 47.  Pneumococcal polysaccharide (PPSV23) vaccine. One dose is recommended after  age 46. Talk to your health care provider about which screenings and vaccines you need and how often you need them. This information is not intended to replace advice given to you by your health care provider. Make sure you discuss any questions you have with your health care provider. Document Released: 03/08/2015 Document Revised: 10/30/2015 Document Reviewed: 12/11/2014 Elsevier Interactive Patient Education  2017 [employer] Prevention in the Home Falls can cause injuries. They can happen to people of all ages. There are many things you can do to make your home safe and to help prevent falls. What can I do on the outside of my home?  Regularly fix the edges of walkways and driveways and fix any cracks.  Remove anything that might make you trip as you walk through a door, such as a raised step or threshold.  Trim any bushes or trees on the path to your home.  Use bright outdoor lighting.  Clear any walking paths of anything that might make someone trip, such as rocks or tools.  Regularly check to see if handrails are loose or broken. Make sure that both sides of any steps have handrails.  Any raised decks and porches should have guardrails on the edges.  Have any leaves, snow, or ice cleared regularly.  Use sand or salt on walking paths during winter.  Clean up any spills in your garage right away. This includes oil or grease spills. What can I do in the bathroom?  Use night lights.  Install grab bars by the toilet and in the tub and shower. Do not use towel bars as grab bars.  Use non-skid mats or decals in the tub or shower.  If you need to sit down in the shower, use a plastic, non-slip stool.  Keep the floor dry. Clean up any water that spills on the floor as soon as it happens.  Remove soap buildup in the tub or shower regularly.  Attach bath mats securely with double-sided non-slip rug tape.  Do not have throw rugs and other things on the floor that can  make you trip. What can I do in the bedroom?  Use night lights.  Make sure that you have a light by your bed that is easy to reach.  Do not use any sheets or blankets that are too big for your bed. They should not hang down onto the floor.  Have a firm chair that has side arms. You can use this for support while you get dressed.  Do not have throw rugs and other things on the floor that can make you trip. What can I do in the kitchen?  Clean up any spills right away.  Avoid walking on wet floors.  Keep items that you use a lot in easy-to-reach places.  If you need to reach something above you, use a strong step stool that has a grab bar.  Keep electrical cords out of the way.  Do not use floor polish or wax that makes floors slippery. If you must use wax, use non-skid floor wax.  Do not have throw rugs and other things on the floor that can make you trip. What can I do with my stairs?  Do not leave any items on the stairs.  Make sure that there are handrails on both sides of the stairs and use them. Fix handrails that are broken or loose. Make sure that handrails are as long as the stairways.  Check any carpeting to make sure that it is firmly attached to the stairs. Fix any carpet that is loose or worn.  Avoid having throw rugs at the top or bottom of the stairs. If you do have throw rugs, attach them to the floor with carpet tape.  Make sure that you have a light switch at the top of the stairs and the bottom of the stairs. If you do not have them, ask someone to add them for you. What else can I do to help prevent falls?  Wear shoes that:  Do not have high heels.  Have rubber bottoms.  Are comfortable and fit you well.  Are closed at the toe. Do not wear sandals.  If you use a stepladder:  Make sure that it is fully opened. Do not climb a closed stepladder.  Make sure that both sides of the stepladder are locked into place.  Ask someone to hold it for you,  if possible.  Clearly mark and make sure that you can see:  Any grab bars or handrails.  First and last steps.  Where the edge of each step is.  Use tools that help you move around (mobility aids) if they are needed. These include:  Canes.  Walkers.  Scooters.  Crutches.  Turn on the lights when you go into a dark area. Replace any light bulbs as soon as they burn out.  Set up your furniture so you have a clear path. Avoid moving your furniture around.  If any of your floors are uneven, fix them.  If there are any pets around you, be aware of where they are.  Review your medicines with your doctor. Some medicines can make you feel dizzy. This can increase your chance of falling. Ask your doctor what other things that you can do to help prevent falls. This information is not intended to replace advice given to you by your health care provider. Make sure you discuss any questions you have with your health care provider. Document Released: 12/06/2008 Document Revised: 07/18/2015 Document Reviewed: 03/16/2014 Elsevier Interactive Patient Education  2017 [employer].

## 2016-07-21 NOTE — Progress Notes (Signed)
Quick Notes   Health Maintenance: PNA 13 ordered.     Abnormal Screen: 6- CIT: 21     Patient Concerns: None     Nurse Concerns: None

## 2016-07-21 NOTE — Progress Notes (Signed)
Subjective:   Leslie Farley is a 83 y.o. female who presents for an Initial Medicare Annual Wellness Visit at [facility]     Objective:    Today's Vitals   07/21/16 1150  BP: 110/70  Pulse: 66  Temp: 97.5 F (36.4 C)  TempSrc: Oral  SpO2: 96%  Weight: 212 lb (96.2 kg)  Height: 5\' 5"  (1.651 m)   Body mass index is 35.28 kg/m.   Current Medications (verified) Outpatient Encounter Prescriptions as of 07/21/2016  Medication Sig  . acetaminophen (TYLENOL) 500 MG tablet Take 1,000 mg by mouth every 6 (six) hours as needed for mild pain or moderate pain.   Marland Kitchen albuterol (PROVENTIL HFA;VENTOLIN HFA) 108 (90 BASE) MCG/ACT inhaler Inhale 2 puffs into the lungs every 6 (six) hours as needed for wheezing or shortness of breath.   . bisacodyl (DULCOLAX) 10 MG suppository Place 10 mg rectally daily as needed for moderate constipation.  . budesonide-formoterol (SYMBICORT) 160-4.5 MCG/ACT inhaler Inhale 2 puffs into the lungs 2 (two) times daily.  . Carboxymethylcellulose Sodium 0.25 % SOLN Place 1 drop into both eyes 2 (two) times daily.   Marland Kitchen dextromethorphan-guaiFENesin (MUCINEX DM) 30-600 MG 12hr tablet Take 1 tablet by mouth 2 (two) times daily.  Marland Kitchen esomeprazole (NEXIUM) 40 MG capsule Take 40 mg by mouth daily at 12 noon.   . gabapentin (NEURONTIN) 100 MG capsule Take 100 mg by mouth at bedtime.   . hydrochlorothiazide (HYDRODIURIL) 25 MG tablet Take 25 mg by mouth daily.  . hydrOXYzine (ATARAX/VISTARIL) 25 MG tablet Take 25 mg by mouth 2 (two) times daily as needed for itching.  . losartan (COZAAR) 50 MG tablet Take 50 mg by mouth daily.  . meclizine (ANTIVERT) 12.5 MG tablet Take 12.5 mg by mouth 3 (three) times daily as needed for dizziness.  . Menthol (ICY HOT) 5 % PTCH Apply 1 patch topically every morning. Apply to right upper arm and bilateral knees QAM and remove QHS  . Menthol, Topical Analgesic, (BIOFREEZE) 4 % GEL Apply to neck and right shoulder three times a  day for pain.  . montelukast (SINGULAIR) 10 MG tablet Take 10 mg by mouth at bedtime.   . Multiple Vitamins-Minerals (MULTIVITAMIN WITH MINERALS) tablet Take 1 tablet by mouth daily.  . nitroGLYCERIN (NITROSTAT) 0.4 MG SL tablet Place 0.4 mg under the tongue every 5 (five) minutes as needed for chest pain (Do not exceed 3 doses per episode.).  Marland Kitchen polyethylene glycol (MIRALAX / GLYCOLAX) packet Take 17 g by mouth 2 (two) times daily.   . sennosides-docusate sodium (SENOKOT-S) 8.6-50 MG tablet Take 2 tablets by mouth 2 (two) times daily as needed.   . traMADol (ULTRAM) 50 MG tablet Take one tablet by mouth at bedtime for pain  . traMADol (ULTRAM) 50 MG tablet Take by mouth every 6 (six) hours as needed.  . traZODone (DESYREL) 150 MG tablet Take 75 mg by mouth at bedtime. Take 1/2 tablet to = 75 mg qhs   No facility-administered encounter medications on file as of 07/21/2016.     Allergies (verified) Patient has no known allergies.   History: Past Medical History:  Diagnosis Date  . Arthritis   . Asthma   . COPD (chronic obstructive pulmonary disease) (HCC)   . GERD (gastroesophageal reflux disease)   . Hypertension    Past Surgical History:  Procedure Laterality Date  . CHOLECYSTECTOMY     Family History  Problem Relation Age of Onset  . Cancer -  Other Mother        Died of throat cancer  . Cancer - Prostate Father   . Diabetes Brother    Social History   Occupational History  . Not on file.   Social History Main Topics  . Smoking status: Former Smoker    Quit date: 02/24/1971  . Smokeless tobacco: Never Used  . Alcohol use No  . Drug use: No  . Sexual activity: Not Currently    Tobacco Counseling Counseling given: Not Answered   Activities of Daily Living In your present state of health, do you have any difficulty performing the following activities: 07/21/2016  Hearing? N  Vision? N  Difficulty concentrating or making decisions? N  Walking or climbing stairs? Y    Dressing or bathing? Y  Doing errands, shopping? Y  Preparing Food and eating ? Y  Using the Toilet? Y  In the past six months, have you accidently leaked urine? N  Do you have problems with loss of bowel control? N  Managing your Medications? Y  Managing your Finances? Y  Housekeeping or managing your Housekeeping? Y  Some recent data might be hidden    Immunizations and Health Maintenance Immunization History  Administered Date(s) Administered  . Influenza,inj,Quad PF,36+ Mos 10/31/2014  . Influenza-Unspecified 11/29/2015  . PPD Test 11/02/2014, 01/14/2016, 01/28/2016   There are no preventive care reminders to display for this patient.  Patient Care Team: Pecola Lawless, MD as PCP - General (Internal Medicine)  Indicate any recent Medical Services you may have received from other than [facility] providers in the past year (date may be approximate).     Assessment:   This is a routine wellness examination for [name].   Hearing/Vision screen No exam data present  Dietary issues and exercise activities discussed: Current Exercise Habits: The patient does not participate in regular exercise at present, Exercise limited by: orthopedic condition(s)  Goals    . Maintain Lifestyle          Starting today pt will maintain lifestyle.       Depression Screen PHQ 2/9 Scores 07/21/2016  PHQ - 2 Score 0    Fall Risk Fall Risk  07/21/2016 12/30/2015 10/04/2015 09/13/2015  Falls in the past year? No No Yes Yes  Number falls in past yr: - - 1 1  Injury with Fall? - - Yes Yes  Risk Factor Category  - - High Fall Risk High Fall Risk  Follow up - - - Falls evaluation completed    Cognitive Function:     6CIT Screen 07/21/2016  What Year? 0 points  What month? 0 points  What time? 3 points  Count back from 20 4 points  Months in reverse 4 points  Repeat phrase 10 points  Total Score 21    Screening Tests Health Maintenance  Topic Date Due  . PNA vac Low Risk Adult (1 of  2 - PCV13) 02/23/2017 (Originally 02/19/1998)  . TETANUS/TDAP  04/12/2023 (Originally 02/20/1950)  . INFLUENZA VACCINE  09/23/2016  . DEXA SCAN  Completed      Plan:    I have personally reviewed and addressed the Medicare Annual Wellness questionnaire and have noted the following in the patient's chart:  A. Medical and social history B. Use of alcohol, tobacco or illicit drugs  C. Current medications and supplements D. Functional ability and status E.  Nutritional status F.  Physical activity G. Advance directives H. List of other physicians I.  Hospitalizations, surgeries, and  ER visits in previous 12 months J.  Vitals K. Screenings to include hearing, vision, cognitive, depression L. Referrals and appointments - none  In addition, I have reviewed and discussed with patient certain preventive protocols, quality metrics, and best practice recommendations. A written personalized care plan for preventive services as well as general preventive health recommendations were provided to patient.  See attached scanned questionnaire for additional information.   Signed,   Annetta MawSara Gonthier, RN Nurse Health Advisor

## 2016-07-22 DIAGNOSIS — R002 Palpitations: Secondary | ICD-10-CM | POA: Diagnosis not present

## 2016-08-24 ENCOUNTER — Encounter: Payer: Self-pay | Admitting: Podiatry

## 2016-08-24 ENCOUNTER — Ambulatory Visit (INDEPENDENT_AMBULATORY_CARE_PROVIDER_SITE_OTHER): Payer: Medicare Other | Admitting: Podiatry

## 2016-08-24 DIAGNOSIS — B351 Tinea unguium: Secondary | ICD-10-CM

## 2016-08-24 DIAGNOSIS — M79676 Pain in unspecified toe(s): Secondary | ICD-10-CM

## 2016-08-26 NOTE — Progress Notes (Signed)
   SUBJECTIVE Patient  presents to office today complaining of elongated, thickened nails. Pain while ambulating in shoes. Patient is unable to trim their own nails.   OBJECTIVE General Patient is awake, alert, and oriented x 3 and in no acute distress. Derm Skin is dry and supple bilateral. Negative open lesions or macerations. Remaining integument unremarkable. Nails are tender, long, thickened and dystrophic with subungual debris, consistent with onychomycosis, 1-5 bilateral. No signs of infection noted. Vasc  DP and PT pedal pulses palpable bilaterally. Temperature gradient within normal limits.  Neuro Epicritic and protective threshold sensation diminished bilaterally.  Musculoskeletal Exam No symptomatic pedal deformities noted bilateral. Muscular strength within normal limits.  ASSESSMENT 1. Onychodystrophic nails 1-5 bilateral with hyperkeratosis of nails.  2. Onychomycosis of nail due to dermatophyte bilateral 3. Pain in foot bilateral  PLAN OF CARE 1. Patient evaluated today.  2. Instructed to maintain good pedal hygiene and foot care.  3. Mechanical debridement of nails 1-5 bilaterally performed using a nail nipper. Filed with dremel without incident.  4. Return to clinic in 3 mos.    Avrom Robarts M. Versia Mignogna, DPM [facility]  Dr. Kirstie Larsen M. Arcelia Pals, DPM    [address]                                        [place], [place] [address]                Office [phone]  Fax [phone]      

## 2016-08-29 NOTE — Addendum Note (Signed)
Addended by: Felecia ShellingEVANS, Djimon Lundstrom M on: 08/29/2016 12:23 PM   Modules accepted: Level of Service

## 2016-09-16 DIAGNOSIS — G4700 Insomnia, unspecified: Secondary | ICD-10-CM | POA: Diagnosis not present

## 2016-09-16 DIAGNOSIS — F339 Major depressive disorder, recurrent, unspecified: Secondary | ICD-10-CM | POA: Diagnosis not present

## 2016-09-24 DIAGNOSIS — K5900 Constipation, unspecified: Secondary | ICD-10-CM | POA: Diagnosis not present

## 2016-09-24 DIAGNOSIS — Z0279 Encounter for issue of other medical certificate: Secondary | ICD-10-CM | POA: Diagnosis not present

## 2016-09-24 DIAGNOSIS — K219 Gastro-esophageal reflux disease without esophagitis: Secondary | ICD-10-CM | POA: Diagnosis not present

## 2016-09-24 DIAGNOSIS — F0390 Unspecified dementia without behavioral disturbance: Secondary | ICD-10-CM | POA: Diagnosis not present

## 2016-11-20 ENCOUNTER — Emergency Department (HOSPITAL_COMMUNITY)
Admission: EM | Admit: 2016-11-20 | Discharge: 2016-11-20 | Disposition: A | Discharge status: Home / Self Care | Payer: Medicare Other | Attending: Emergency Medicine | Admitting: Emergency Medicine

## 2016-11-20 ENCOUNTER — Encounter (HOSPITAL_COMMUNITY): Payer: Self-pay

## 2016-11-20 ENCOUNTER — Emergency Department (HOSPITAL_COMMUNITY): Payer: Medicare Other

## 2016-11-20 DIAGNOSIS — S40011A Contusion of right shoulder, initial encounter: Secondary | ICD-10-CM | POA: Insufficient documentation

## 2016-11-20 DIAGNOSIS — W19XXXA Unspecified fall, initial encounter: Secondary | ICD-10-CM | POA: Insufficient documentation

## 2016-11-20 DIAGNOSIS — Y999 Unspecified external cause status: Secondary | ICD-10-CM | POA: Insufficient documentation

## 2016-11-20 DIAGNOSIS — Z23 Encounter for immunization: Secondary | ICD-10-CM | POA: Insufficient documentation

## 2016-11-20 DIAGNOSIS — S40012A Contusion of left shoulder, initial encounter: Secondary | ICD-10-CM | POA: Diagnosis not present

## 2016-11-20 DIAGNOSIS — I10 Essential (primary) hypertension: Secondary | ICD-10-CM | POA: Diagnosis not present

## 2016-11-20 DIAGNOSIS — J45909 Unspecified asthma, uncomplicated: Secondary | ICD-10-CM | POA: Insufficient documentation

## 2016-11-20 DIAGNOSIS — M25561 Pain in right knee: Secondary | ICD-10-CM | POA: Diagnosis not present

## 2016-11-20 DIAGNOSIS — Z79899 Other long term (current) drug therapy: Secondary | ICD-10-CM | POA: Diagnosis not present

## 2016-11-20 DIAGNOSIS — S0181XA Laceration without foreign body of other part of head, initial encounter: Secondary | ICD-10-CM | POA: Diagnosis not present

## 2016-11-20 DIAGNOSIS — R2681 Unsteadiness on feet: Secondary | ICD-10-CM | POA: Insufficient documentation

## 2016-11-20 DIAGNOSIS — Y92002 Bathroom of unspecified non-institutional (private) residence single-family (private) house as the place of occurrence of the external cause: Secondary | ICD-10-CM | POA: Insufficient documentation

## 2016-11-20 DIAGNOSIS — S0450XA Injury of facial nerve, unspecified side, initial encounter: Secondary | ICD-10-CM | POA: Diagnosis not present

## 2016-11-20 DIAGNOSIS — S8001XA Contusion of right knee, initial encounter: Secondary | ICD-10-CM

## 2016-11-20 DIAGNOSIS — S0990XA Unspecified injury of head, initial encounter: Secondary | ICD-10-CM | POA: Diagnosis not present

## 2016-11-20 DIAGNOSIS — Z87891 Personal history of nicotine dependence: Secondary | ICD-10-CM | POA: Diagnosis not present

## 2016-11-20 DIAGNOSIS — S199XXA Unspecified injury of neck, initial encounter: Secondary | ICD-10-CM | POA: Diagnosis not present

## 2016-11-20 DIAGNOSIS — I2510 Atherosclerotic heart disease of native coronary artery without angina pectoris: Secondary | ICD-10-CM | POA: Insufficient documentation

## 2016-11-20 DIAGNOSIS — Y939 Activity, unspecified: Secondary | ICD-10-CM | POA: Diagnosis not present

## 2016-11-20 DIAGNOSIS — J449 Chronic obstructive pulmonary disease, unspecified: Secondary | ICD-10-CM | POA: Diagnosis not present

## 2016-11-20 DIAGNOSIS — M25511 Pain in right shoulder: Secondary | ICD-10-CM | POA: Diagnosis not present

## 2016-11-20 MED ORDER — IBUPROFEN 200 MG PO TABS
400.0000 mg | ORAL_TABLET | Freq: Once | ORAL | Status: AC
  Administered 2016-11-20: 400 mg via ORAL
  Filled 2016-11-20: qty 2

## 2016-11-20 MED ORDER — ACETAMINOPHEN 500 MG PO TABS: 1000.0000 mg | ORAL_TABLET | Freq: Four times a day (QID) | ORAL | 0 refills | Status: AC | PRN

## 2016-11-20 MED ORDER — TETANUS-DIPHTH-ACELL PERTUSSIS 5-2.5-18.5 LF-MCG/0.5 IM SUSP
0.5000 mL | Freq: Once | INTRAMUSCULAR | Status: AC
  Administered 2016-11-20: 0.5 mL via INTRAMUSCULAR
  Filled 2016-11-20: qty 0.5

## 2016-11-20 MED ORDER — LIDOCAINE HCL (PF) 2 % IJ SOLN
INTRAMUSCULAR | Status: AC
  Filled 2016-11-20: qty 10

## 2016-11-20 NOTE — ED Provider Notes (Signed)
[facility]-EMERGENCY DEPT Provider Note   CSN: [id number] Arrival date & time: 11/20/16  1414     History   Chief Complaint Chief Complaint  Patient presents with  . Fall  . Head Laceration    HPI Leslie Farley is a 83 y.o. female.  The history is provided by the patient. No language interpreter was used.  Fall  This is a new problem. The current episode started less than 1 hour ago. The problem occurs constantly. The problem has not changed since onset.Pertinent negatives include no headaches. Nothing aggravates the symptoms. Nothing relieves the symptoms. She has tried nothing for the symptoms. The treatment provided moderate relief.  Head Laceration  Pertinent negatives include no headaches.   Pt reports she went to the bathroom when she stood up she fell.  Pt reports she has problems with her balance.  Pt is at [facility].  Past Medical History:  Diagnosis Date  . Arthritis   . Asthma   . COPD (chronic obstructive pulmonary disease) (HCC)   . GERD (gastroesophageal reflux disease)   . Hypertension     Patient Active Problem List   Diagnosis Date Noted  . Chronic bronchitis (HCC) 01/15/2016  . Insomnia 01/15/2016  . Constipation 01/15/2016  . CAD (coronary artery disease) of artery bypass graft 01/15/2016  . Benign paroxysmal positional vertigo 01/15/2016  . History of pulmonary embolism 01/14/2016  . Peripheral neuropathy 06/26/2015  . Chronic constipation 05/10/2015  . COPD (chronic obstructive pulmonary disease) (HCC) 03/01/2015  . Acute encephalopathy 11/10/2014  . Right thyroid nodule 11/02/2014  . Hypertension   . GERD (gastroesophageal reflux disease)   . Arthritis     Past Surgical History:  Procedure Laterality Date  . CHOLECYSTECTOMY      OB History    No data available       Home Medications    Prior to Admission medications   Medication Sig Start Date End Date Taking? Authorizing Provider  acetaminophen (TYLENOL) 500 MG tablet Take  1,000 mg by mouth every 6 (six) hours as needed for mild pain or moderate pain.    Yes [provider]  albuterol (PROVENTIL HFA;VENTOLIN HFA) 108 (90 BASE) MCG/ACT inhaler Inhale 2 puffs into the lungs every 6 (six) hours as needed for wheezing or shortness of breath.    Yes [provider]  bisacodyl (DULCOLAX) 10 MG suppository Place 10 mg rectally daily as needed for moderate constipation.   Yes [provider]  budesonide-formoterol (SYMBICORT) 160-4.5 MCG/ACT inhaler Inhale 2 puffs into the lungs 2 (two) times daily.   Yes [provider]  Carboxymethylcellulose Sodium 0.25 % SOLN Place 1 drop into both eyes 2 (two) times daily.    Yes [provider]  dextromethorphan-guaiFENesin (MUCINEX DM) 30-600 MG 12hr tablet Take 1 tablet by mouth 2 (two) times daily.   Yes [provider]  gabapentin (NEURONTIN) 100 MG capsule Take 100 mg by mouth at bedtime.    Yes [provider]  hydrochlorothiazide (HYDRODIURIL) 25 MG tablet Take 25 mg by mouth daily.   Yes [provider]  losartan (COZAAR) 50 MG tablet Take 50 mg by mouth daily.   Yes [provider]  meclizine (ANTIVERT) 12.5 MG tablet Take 12.5 mg by mouth 3 (three) times daily as needed for dizziness.   Yes [provider]  Menthol (ICY HOT) 5 % PTCH Apply 1 patch topically every morning. Apply to right upper arm and bilateral knees QAM and remove QHS   Yes  [provider]  Menthol, Topical Analgesic, (BIOFREEZE) 4 % GEL Apply to neck and right shoulder three times a day for pain.   Yes [provider]  montelukast (SINGULAIR) 10 MG tablet Take 10 mg by mouth at bedtime.    Yes [provider]  Multiple Vitamins-Minerals (MULTIVITAMIN WITH MINERALS) tablet Take 1 tablet by mouth daily.   Yes [provider]  polyethylene glycol (MIRALAX / GLYCOLAX) packet Take 17 g by mouth 2 (two) times daily.    Yes [provider]  ranitidine (ZANTAC) 150 MG tablet Take 150 mg by mouth 2 (two) times daily. 11/09/16  Yes [provider]  senna (SENOKOT) 8.6 MG tablet Take 1 tablet by mouth 2 (two) times daily.   Yes [provider]  traMADol (ULTRAM) 50 MG tablet Take one tablet by mouth at bedtime for pain 11/18/15  Yes Sharon Seller, NP  traZODone (DESYREL) 100 MG tablet Take 100 mg by mouth at bedtime. Take 1/2 tablet to = 75 mg qhs    Yes [provider]  nitroGLYCERIN (NITROSTAT) 0.4 MG SL tablet Place 0.4 mg under the tongue every 5 (five) minutes as needed for chest pain (Do not exceed 3 doses per episode.).    [provider]    Family History Family History  Problem Relation Age of Onset  . Cancer - Other Mother        Died of throat cancer  . Cancer - Prostate Father   . Diabetes Brother     Social History Social History  Substance Use Topics  . Smoking status: Former Smoker    Quit date: 02/24/1971  . Smokeless tobacco: Never Used  . Alcohol use No     Allergies   Patient has no known allergies.   Review of Systems Review of Systems  Musculoskeletal: Positive for myalgias.  Skin: Positive for wound.  Neurological: Negative for headaches.  All other systems reviewed and are negative.    Physical Exam Updated Vital Signs BP 125/61 (BP Location: Left Arm)   Pulse 67   Temp 97.6 F (36.4 C) (Oral)   Resp 14   SpO2 100%   Physical Exam  Constitutional: She appears well-developed and well-nourished.  HENT:  Head: Normocephalic.  Right Ear: External ear normal.  Left Ear: External ear normal.  1cm laceration forehead.  Bruising forehead,  Abrasion face,   Eyes: Pupils are equal, round, and reactive to light. EOM are normal.  Neck: Normal range of motion.  Tender right shoulder, tender right knee  Cardiovascular: Normal rate and regular rhythm.   Pulmonary/Chest: Effort normal and breath sounds normal.  Abdominal: Soft.  Musculoskeletal:    Tender right shoulder to palpation,  Right knee tender to palpation   Neurological: She is alert.  Skin: Skin is warm.  Psychiatric: She has a normal mood and affect.  Nursing note and vitals reviewed.    ED Treatments / Results  Labs (all labs ordered are listed, but only abnormal results are displayed) Labs Reviewed - No data to display  EKG  EKG Interpretation None       Radiology Dg Shoulder Right  Result Date: 11/20/2016 CLINICAL DATA:  Fall.  Diffuse right shoulder pain. EXAM: RIGHT SHOULDER - 2+ VIEW COMPARISON:  Right shoulder series 09/21/2014. Chest x-ray 08/10/2013. FINDINGS: No acute bony or joint abnormality. Severe glenohumeral degenerative change again noted. Acromioclavicular degenerative change also present. Similar findings noted on prior exam. No periarticular osteopenia. No evidence of fracture  or dislocation. IMPRESSION: 1. No acute abnormality.  No evidence of fracture dislocation. 2. Severe degenerative changes right shoulder particular about the glenohumeral joint. Similar findings noted on prior exams. Electronically Signed   By: Maisie Fus  Register   On: 11/20/2016 15:44   Ct Head Wo Contrast  Result Date: 11/20/2016 CLINICAL DATA:  Per EMS, Pt, from [facility], c/o head lac, R shoulder and R knee pain r/t fall this morning. Abrasion to lt. forehead Pt reports she slipped and fell in bathroom EXAM: CT HEAD WITHOUT CONTRAST CT CERVICAL SPINE WITHOUT CONTRAST TECHNIQUE: Multidetector CT imaging of the head and cervical spine was performed following the standard protocol without intravenous contrast. Multiplanar CT image reconstructions of the cervical spine were also generated. COMPARISON:  10/01/2014 FINDINGS: CT HEAD FINDINGS Brain: There is central and cortical atrophy. Periventricular white matter changes are consistent with small vessel disease. There is no intra or extra-axial fluid collection or mass lesion. The basilar cisterns and ventricles  have a normal appearance. There is no CT evidence for acute infarction or hemorrhage. Vascular: There is atherosclerotic calcification of the internal carotid arteries. Skull: No calvarial fracture. Sinuses/Orbits: There is a small air-fluid level within the left sphenoid air cell. Laceration of the left supraorbital region, with air tracking into the preseptal soft tissues. The globe is intact. Other: There is scalp hematoma and laceration in the left supra orbital region. CT CERVICAL SPINE FINDINGS Alignment: There is degenerative anterolisthesis of C3 on C4 measuring 3 mm. There is degenerative anterolisthesis of C4 on C5 which measures 3 mm. Skull base and vertebrae: No acute fracture. Soft tissues and spinal canal: No prevertebral fluid or swelling. No visible canal hematoma. Disc levels: Significant mid cervical degenerative changes, with disc height loss and facet hypertrophy at all the mid cervical levels. Bilateral foraminal narrowing at C3-4, right foraminal narrowing at C4-5, bilateral foraminal narrowing at C5-6, C6-7. Upper chest: Unremarkable. Other: None IMPRESSION: 1. Atrophy and small vessel disease. 2.  No evidence for acute  abnormality. 3. Left frontal scalp laceration with air tracking into the preseptal soft tissues of the left orbit. Globe is intact. No orbital fracture. 4. Moderate mid cervical spondylosis without acute fracture. 5. Degenerative grade 1 anterolisthesis of C3 on C4 and C4 on C5. Electronically Signed   By: Norva Pavlov M.D.   On: 11/20/2016 16:19   Ct Cervical Spine Wo Contrast  Result Date: 11/20/2016 CLINICAL DATA:  Per EMS, Pt, from [facility], c/o head lac, R shoulder and R knee pain r/t fall this morning. Abrasion to lt. forehead Pt reports she slipped and fell in bathroom EXAM: CT HEAD WITHOUT CONTRAST CT CERVICAL SPINE WITHOUT CONTRAST TECHNIQUE: Multidetector CT imaging of the head and cervical spine was performed following the standard protocol  without intravenous contrast. Multiplanar CT image reconstructions of the cervical spine were also generated. COMPARISON:  10/01/2014 FINDINGS: CT HEAD FINDINGS Brain: There is central and cortical atrophy. Periventricular white matter changes are consistent with small vessel disease. There is no intra or extra-axial fluid collection or mass lesion. The basilar cisterns and ventricles have a normal appearance. There is no CT evidence for acute infarction or hemorrhage. Vascular: There is atherosclerotic calcification of the internal carotid arteries. Skull: No calvarial fracture. Sinuses/Orbits: There is a small air-fluid level within the left sphenoid air cell. Laceration of the left supraorbital region, with air tracking into the preseptal soft tissues. The globe is intact. Other: There is scalp hematoma and laceration in  the left supra orbital region. CT CERVICAL SPINE FINDINGS Alignment: There is degenerative anterolisthesis of C3 on C4 measuring 3 mm. There is degenerative anterolisthesis of C4 on C5 which measures 3 mm. Skull base and vertebrae: No acute fracture. Soft tissues and spinal canal: No prevertebral fluid or swelling. No visible canal hematoma. Disc levels: Significant mid cervical degenerative changes, with disc height loss and facet hypertrophy at all the mid cervical levels. Bilateral foraminal narrowing at C3-4, right foraminal narrowing at C4-5, bilateral foraminal narrowing at C5-6, C6-7. Upper chest: Unremarkable. Other: None IMPRESSION: 1. Atrophy and small vessel disease. 2.  No evidence for acute  abnormality. 3. Left frontal scalp laceration with air tracking into the preseptal soft tissues of the left orbit. Globe is intact. No orbital fracture. 4. Moderate mid cervical spondylosis without acute fracture. 5. Degenerative grade 1 anterolisthesis of C3 on C4 and C4 on C5. Electronically Signed   By: Norva Pavlov M.D.   On: 11/20/2016 16:19   Dg Knee Complete 4 Views Right  Result  Date: 11/20/2016 CLINICAL DATA:  Diffuse right knee pain after a fall earlier today. EXAM: RIGHT KNEE - COMPLETE 4+ VIEW COMPARISON:  None. FINDINGS: Bones are demineralized. No fracture. No evidence of subluxation. Loss of joint space noted medial compartment. Hypertrophic spurring is visible in all 3 compartments. Meniscal calcification is evident. No joint effusion. Hypertrophic spurring is visible in all 3 compartments. IMPRESSION: Degenerative changes without acute bony abnormality. Electronically Signed   By: Kennith Center M.D.   On: 11/20/2016 15:42    Procedures .Marland KitchenLaceration Repair Date/Time: 11/20/2016 5:17 PM Performed by: Elson Areas Authorized by: Elson Areas   Consent:    Consent obtained:  Verbal   Consent given by:  Patient   Risks discussed:  Infection Anesthesia (see MAR for exact dosages):    Anesthesia method:  Local infiltration Laceration details:    Location:  Face   Face location:  Forehead   Length (cm):  1   Depth (mm):  5 Repair type:    Repair type:  Simple Pre-procedure details:    Preparation:  Patient was prepped and draped in usual sterile fashion Exploration:    Contaminated: no   Treatment:    Area cleansed with:  Betadine   Irrigation method:  Syringe   Visualized foreign bodies/material removed: no   Skin repair:    Repair method:  Tissue adhesive Approximation:    Approximation:  Close   Vermilion border: well-aligned     (including critical care time)  Medications Ordered in ED Medications  Tdap (BOOSTRIX) injection 0.5 mL (not administered)     Initial Impression / Assessment and Plan / ED Course  I have reviewed the triage vital signs and the nursing notes.  Pertinent labs & imaging results that were available during my care of the patient were reviewed by me and considered in my medical decision making (see chart for details).  Clinical Course as of Nov 20 1721  Fri Nov 20, 2016  1548 DG Knee Complete 4 Views Right [LS]     Clinical Course User Index [LS] Elson Areas, [place]      Final Clinical Impressions(s) / ED Diagnoses   Final diagnoses:  Facial laceration, initial encounter  Contusion of right knee, initial encounter  Contusion of multiple sites of right shoulder, initial encounter    New Prescriptions New Prescriptions   No medications on file  Tylenol for pain    Elson Areas, PA-C 11/20/16  1723    Samuel Jester, DO 11/21/16 2320

## 2016-11-20 NOTE — ED Notes (Signed)
Bed: [facility] Expected date:  Expected time:  Means of arrival:  Comments: No bed 

## 2016-11-20 NOTE — ED Triage Notes (Addendum)
Per EMS, Pt, from [facility], c/o head lac, R shoulder and R knee pain r/t fall this morning.  No deformities noted.  A & Ox4.     Pt reports she slipped and fell in bathroom.

## 2016-11-20 NOTE — Discharge Instructions (Signed)
Return if any problems.

## 2016-11-23 DIAGNOSIS — M6281 Muscle weakness (generalized): Secondary | ICD-10-CM | POA: Diagnosis not present

## 2016-11-23 DIAGNOSIS — Z9181 History of falling: Secondary | ICD-10-CM | POA: Diagnosis not present

## 2016-11-25 ENCOUNTER — Ambulatory Visit: Payer: Medicare Other | Admitting: Podiatry

## 2016-11-25 DIAGNOSIS — Z9181 History of falling: Secondary | ICD-10-CM | POA: Diagnosis not present

## 2016-11-25 DIAGNOSIS — M6281 Muscle weakness (generalized): Secondary | ICD-10-CM | POA: Diagnosis not present

## 2016-11-26 DIAGNOSIS — M6281 Muscle weakness (generalized): Secondary | ICD-10-CM | POA: Diagnosis not present

## 2016-11-26 DIAGNOSIS — Z9181 History of falling: Secondary | ICD-10-CM | POA: Diagnosis not present

## 2016-11-27 DIAGNOSIS — Z9181 History of falling: Secondary | ICD-10-CM | POA: Diagnosis not present

## 2016-11-27 DIAGNOSIS — M6281 Muscle weakness (generalized): Secondary | ICD-10-CM | POA: Diagnosis not present

## 2016-11-30 DIAGNOSIS — Z9181 History of falling: Secondary | ICD-10-CM | POA: Diagnosis not present

## 2016-11-30 DIAGNOSIS — M6281 Muscle weakness (generalized): Secondary | ICD-10-CM | POA: Diagnosis not present

## 2016-12-01 DIAGNOSIS — M6281 Muscle weakness (generalized): Secondary | ICD-10-CM | POA: Diagnosis not present

## 2016-12-01 DIAGNOSIS — Z9181 History of falling: Secondary | ICD-10-CM | POA: Diagnosis not present

## 2016-12-02 ENCOUNTER — Ambulatory Visit: Payer: Medicare Other | Admitting: Podiatry

## 2016-12-02 DIAGNOSIS — M6281 Muscle weakness (generalized): Secondary | ICD-10-CM | POA: Diagnosis not present

## 2016-12-02 DIAGNOSIS — Z9181 History of falling: Secondary | ICD-10-CM | POA: Diagnosis not present

## 2016-12-07 DIAGNOSIS — M6281 Muscle weakness (generalized): Secondary | ICD-10-CM | POA: Diagnosis not present

## 2016-12-07 DIAGNOSIS — Z9181 History of falling: Secondary | ICD-10-CM | POA: Diagnosis not present

## 2016-12-10 DIAGNOSIS — Z9181 History of falling: Secondary | ICD-10-CM | POA: Diagnosis not present

## 2016-12-10 DIAGNOSIS — M6281 Muscle weakness (generalized): Secondary | ICD-10-CM | POA: Diagnosis not present

## 2016-12-11 DIAGNOSIS — Z9181 History of falling: Secondary | ICD-10-CM | POA: Diagnosis not present

## 2016-12-11 DIAGNOSIS — M6281 Muscle weakness (generalized): Secondary | ICD-10-CM | POA: Diagnosis not present

## 2016-12-14 ENCOUNTER — Encounter: Payer: Self-pay | Admitting: Podiatry

## 2016-12-14 ENCOUNTER — Ambulatory Visit (INDEPENDENT_AMBULATORY_CARE_PROVIDER_SITE_OTHER): Payer: Medicare Other | Admitting: Podiatry

## 2016-12-14 DIAGNOSIS — M79676 Pain in unspecified toe(s): Secondary | ICD-10-CM

## 2016-12-14 DIAGNOSIS — B351 Tinea unguium: Secondary | ICD-10-CM

## 2016-12-15 DIAGNOSIS — Z9181 History of falling: Secondary | ICD-10-CM | POA: Diagnosis not present

## 2016-12-15 DIAGNOSIS — M6281 Muscle weakness (generalized): Secondary | ICD-10-CM | POA: Diagnosis not present

## 2016-12-16 DIAGNOSIS — M6281 Muscle weakness (generalized): Secondary | ICD-10-CM | POA: Diagnosis not present

## 2016-12-16 DIAGNOSIS — Z9181 History of falling: Secondary | ICD-10-CM | POA: Diagnosis not present

## 2016-12-16 NOTE — Progress Notes (Signed)
   SUBJECTIVE Patient presents to office today complaining of elongated, thickened nails. Pain while ambulating in shoes. Patient is unable to trim their own nails.   Past Medical History:  Diagnosis Date  . Arthritis   . Asthma   . COPD (chronic obstructive pulmonary disease) (HCC)   . GERD (gastroesophageal reflux disease)   . Hypertension     OBJECTIVE General Patient is awake, alert, and oriented x 3 and in no acute distress. Derm Skin is dry and supple bilateral. Negative open lesions or macerations. Remaining integument unremarkable. Nails are tender, long, thickened and dystrophic with subungual debris, consistent with onychomycosis, 1-5 bilateral. No signs of infection noted. Vasc  DP and PT pedal pulses palpable bilaterally. Temperature gradient within normal limits.  Neuro Epicritic and protective threshold sensation diminished bilaterally.  Musculoskeletal Exam No symptomatic pedal deformities noted bilateral. Muscular strength within normal limits.  ASSESSMENT 1. Onychodystrophic nails 1-5 bilateral with hyperkeratosis of nails.  2. Onychomycosis of nail due to dermatophyte bilateral 3. Pain in foot bilateral  PLAN OF CARE 1. Patient evaluated today.  2. Instructed to maintain good pedal hygiene and foot care.  3. Mechanical debridement of nails 1-5 bilaterally performed using a nail nipper. Filed with dremel without incident.  4. Return to clinic in 3 mos.    Felecia ShellingBrent M. Sequoya Hogsett, DPM [facility]  Dr. Felecia ShellingBrent M. Kenly Henckel, DPM    [address]                                        [place], [place] [id number]                Office [phone]  Fax [phone]

## 2016-12-18 DIAGNOSIS — M6281 Muscle weakness (generalized): Secondary | ICD-10-CM | POA: Diagnosis not present

## 2016-12-18 DIAGNOSIS — Z9181 History of falling: Secondary | ICD-10-CM | POA: Diagnosis not present

## 2016-12-21 DIAGNOSIS — M6281 Muscle weakness (generalized): Secondary | ICD-10-CM | POA: Diagnosis not present

## 2016-12-21 DIAGNOSIS — Z9181 History of falling: Secondary | ICD-10-CM | POA: Diagnosis not present

## 2016-12-22 DIAGNOSIS — Z23 Encounter for immunization: Secondary | ICD-10-CM | POA: Diagnosis not present

## 2016-12-23 DIAGNOSIS — Z9181 History of falling: Secondary | ICD-10-CM | POA: Diagnosis not present

## 2016-12-23 DIAGNOSIS — M6281 Muscle weakness (generalized): Secondary | ICD-10-CM | POA: Diagnosis not present

## 2016-12-25 DIAGNOSIS — Z9181 History of falling: Secondary | ICD-10-CM | POA: Diagnosis not present

## 2016-12-25 DIAGNOSIS — M6281 Muscle weakness (generalized): Secondary | ICD-10-CM | POA: Diagnosis not present

## 2016-12-28 DIAGNOSIS — M6281 Muscle weakness (generalized): Secondary | ICD-10-CM | POA: Diagnosis not present

## 2016-12-28 DIAGNOSIS — Z9181 History of falling: Secondary | ICD-10-CM | POA: Diagnosis not present

## 2017-01-19 DIAGNOSIS — G4700 Insomnia, unspecified: Secondary | ICD-10-CM | POA: Diagnosis not present

## 2017-02-05 DIAGNOSIS — J449 Chronic obstructive pulmonary disease, unspecified: Secondary | ICD-10-CM | POA: Diagnosis not present

## 2017-02-05 DIAGNOSIS — K5900 Constipation, unspecified: Secondary | ICD-10-CM | POA: Diagnosis not present

## 2017-02-05 DIAGNOSIS — K219 Gastro-esophageal reflux disease without esophagitis: Secondary | ICD-10-CM | POA: Diagnosis not present

## 2017-02-18 DIAGNOSIS — G4700 Insomnia, unspecified: Secondary | ICD-10-CM | POA: Diagnosis not present

## 2017-03-15 ENCOUNTER — Encounter: Payer: Medicare Other | Admitting: Podiatry

## 2017-03-19 NOTE — Progress Notes (Signed)
This encounter was created in error - please disregard.

## 2017-03-26 DIAGNOSIS — R262 Difficulty in walking, not elsewhere classified: Secondary | ICD-10-CM | POA: Diagnosis not present

## 2017-03-26 DIAGNOSIS — B351 Tinea unguium: Secondary | ICD-10-CM | POA: Diagnosis not present

## 2017-03-26 DIAGNOSIS — I739 Peripheral vascular disease, unspecified: Secondary | ICD-10-CM | POA: Diagnosis not present

## 2017-03-26 DIAGNOSIS — L600 Ingrowing nail: Secondary | ICD-10-CM | POA: Diagnosis not present

## 2017-03-27 DIAGNOSIS — H2513 Age-related nuclear cataract, bilateral: Secondary | ICD-10-CM | POA: Diagnosis not present

## 2017-03-27 DIAGNOSIS — H40033 Anatomical narrow angle, bilateral: Secondary | ICD-10-CM | POA: Diagnosis not present

## 2017-04-09 DIAGNOSIS — E669 Obesity, unspecified: Secondary | ICD-10-CM | POA: Diagnosis not present

## 2017-04-09 DIAGNOSIS — I10 Essential (primary) hypertension: Secondary | ICD-10-CM | POA: Diagnosis not present

## 2017-04-09 DIAGNOSIS — J449 Chronic obstructive pulmonary disease, unspecified: Secondary | ICD-10-CM | POA: Diagnosis not present

## 2017-04-09 DIAGNOSIS — R5381 Other malaise: Secondary | ICD-10-CM | POA: Diagnosis not present

## 2017-04-12 DIAGNOSIS — Z79899 Other long term (current) drug therapy: Secondary | ICD-10-CM | POA: Diagnosis not present

## 2017-04-16 DIAGNOSIS — H25812 Combined forms of age-related cataract, left eye: Secondary | ICD-10-CM | POA: Diagnosis not present

## 2017-04-16 DIAGNOSIS — H2511 Age-related nuclear cataract, right eye: Secondary | ICD-10-CM | POA: Diagnosis not present

## 2017-04-16 DIAGNOSIS — H2513 Age-related nuclear cataract, bilateral: Secondary | ICD-10-CM | POA: Diagnosis not present

## 2017-04-16 DIAGNOSIS — Z961 Presence of intraocular lens: Secondary | ICD-10-CM | POA: Diagnosis not present

## 2017-04-16 DIAGNOSIS — H25811 Combined forms of age-related cataract, right eye: Secondary | ICD-10-CM | POA: Diagnosis not present

## 2017-04-16 DIAGNOSIS — H43811 Vitreous degeneration, right eye: Secondary | ICD-10-CM | POA: Diagnosis not present

## 2017-05-07 DIAGNOSIS — H2512 Age-related nuclear cataract, left eye: Secondary | ICD-10-CM | POA: Diagnosis not present

## 2017-05-07 DIAGNOSIS — H2513 Age-related nuclear cataract, bilateral: Secondary | ICD-10-CM | POA: Diagnosis not present

## 2017-05-07 DIAGNOSIS — Z961 Presence of intraocular lens: Secondary | ICD-10-CM | POA: Diagnosis not present

## 2017-05-07 DIAGNOSIS — H25812 Combined forms of age-related cataract, left eye: Secondary | ICD-10-CM | POA: Diagnosis not present

## 2017-05-11 DIAGNOSIS — M6281 Muscle weakness (generalized): Secondary | ICD-10-CM | POA: Diagnosis not present

## 2017-05-11 DIAGNOSIS — J449 Chronic obstructive pulmonary disease, unspecified: Secondary | ICD-10-CM | POA: Diagnosis not present

## 2017-05-12 DIAGNOSIS — M6281 Muscle weakness (generalized): Secondary | ICD-10-CM | POA: Diagnosis not present

## 2017-05-12 DIAGNOSIS — J449 Chronic obstructive pulmonary disease, unspecified: Secondary | ICD-10-CM | POA: Diagnosis not present

## 2017-05-13 DIAGNOSIS — J449 Chronic obstructive pulmonary disease, unspecified: Secondary | ICD-10-CM | POA: Diagnosis not present

## 2017-05-13 DIAGNOSIS — M6281 Muscle weakness (generalized): Secondary | ICD-10-CM | POA: Diagnosis not present

## 2017-05-14 DIAGNOSIS — J449 Chronic obstructive pulmonary disease, unspecified: Secondary | ICD-10-CM | POA: Diagnosis not present

## 2017-05-14 DIAGNOSIS — M6281 Muscle weakness (generalized): Secondary | ICD-10-CM | POA: Diagnosis not present

## 2017-05-17 DIAGNOSIS — J449 Chronic obstructive pulmonary disease, unspecified: Secondary | ICD-10-CM | POA: Diagnosis not present

## 2017-05-17 DIAGNOSIS — M6281 Muscle weakness (generalized): Secondary | ICD-10-CM | POA: Diagnosis not present

## 2017-05-18 DIAGNOSIS — M6281 Muscle weakness (generalized): Secondary | ICD-10-CM | POA: Diagnosis not present

## 2017-05-18 DIAGNOSIS — J449 Chronic obstructive pulmonary disease, unspecified: Secondary | ICD-10-CM | POA: Diagnosis not present

## 2017-05-19 DIAGNOSIS — M6281 Muscle weakness (generalized): Secondary | ICD-10-CM | POA: Diagnosis not present

## 2017-05-19 DIAGNOSIS — J449 Chronic obstructive pulmonary disease, unspecified: Secondary | ICD-10-CM | POA: Diagnosis not present

## 2017-05-20 DIAGNOSIS — M6281 Muscle weakness (generalized): Secondary | ICD-10-CM | POA: Diagnosis not present

## 2017-05-20 DIAGNOSIS — J449 Chronic obstructive pulmonary disease, unspecified: Secondary | ICD-10-CM | POA: Diagnosis not present

## 2017-05-21 DIAGNOSIS — J449 Chronic obstructive pulmonary disease, unspecified: Secondary | ICD-10-CM | POA: Diagnosis not present

## 2017-05-21 DIAGNOSIS — M6281 Muscle weakness (generalized): Secondary | ICD-10-CM | POA: Diagnosis not present

## 2017-05-24 DIAGNOSIS — M6281 Muscle weakness (generalized): Secondary | ICD-10-CM | POA: Diagnosis not present

## 2017-05-24 DIAGNOSIS — J449 Chronic obstructive pulmonary disease, unspecified: Secondary | ICD-10-CM | POA: Diagnosis not present

## 2017-05-24 DIAGNOSIS — R278 Other lack of coordination: Secondary | ICD-10-CM | POA: Diagnosis not present

## 2017-05-25 DIAGNOSIS — M6281 Muscle weakness (generalized): Secondary | ICD-10-CM | POA: Diagnosis not present

## 2017-05-25 DIAGNOSIS — R278 Other lack of coordination: Secondary | ICD-10-CM | POA: Diagnosis not present

## 2017-05-25 DIAGNOSIS — J449 Chronic obstructive pulmonary disease, unspecified: Secondary | ICD-10-CM | POA: Diagnosis not present

## 2017-05-26 DIAGNOSIS — R05 Cough: Secondary | ICD-10-CM | POA: Diagnosis not present

## 2017-05-26 DIAGNOSIS — J449 Chronic obstructive pulmonary disease, unspecified: Secondary | ICD-10-CM | POA: Diagnosis not present

## 2017-05-26 DIAGNOSIS — R278 Other lack of coordination: Secondary | ICD-10-CM | POA: Diagnosis not present

## 2017-05-26 DIAGNOSIS — M6281 Muscle weakness (generalized): Secondary | ICD-10-CM | POA: Diagnosis not present

## 2017-05-26 DIAGNOSIS — K219 Gastro-esophageal reflux disease without esophagitis: Secondary | ICD-10-CM | POA: Diagnosis not present

## 2017-05-27 DIAGNOSIS — J449 Chronic obstructive pulmonary disease, unspecified: Secondary | ICD-10-CM | POA: Diagnosis not present

## 2017-05-27 DIAGNOSIS — M6281 Muscle weakness (generalized): Secondary | ICD-10-CM | POA: Diagnosis not present

## 2017-05-27 DIAGNOSIS — R278 Other lack of coordination: Secondary | ICD-10-CM | POA: Diagnosis not present

## 2017-05-28 DIAGNOSIS — J449 Chronic obstructive pulmonary disease, unspecified: Secondary | ICD-10-CM | POA: Diagnosis not present

## 2017-05-28 DIAGNOSIS — M6281 Muscle weakness (generalized): Secondary | ICD-10-CM | POA: Diagnosis not present

## 2017-05-28 DIAGNOSIS — R278 Other lack of coordination: Secondary | ICD-10-CM | POA: Diagnosis not present

## 2017-05-31 DIAGNOSIS — J449 Chronic obstructive pulmonary disease, unspecified: Secondary | ICD-10-CM | POA: Diagnosis not present

## 2017-05-31 DIAGNOSIS — R278 Other lack of coordination: Secondary | ICD-10-CM | POA: Diagnosis not present

## 2017-05-31 DIAGNOSIS — M6281 Muscle weakness (generalized): Secondary | ICD-10-CM | POA: Diagnosis not present

## 2017-06-01 DIAGNOSIS — R278 Other lack of coordination: Secondary | ICD-10-CM | POA: Diagnosis not present

## 2017-06-01 DIAGNOSIS — J449 Chronic obstructive pulmonary disease, unspecified: Secondary | ICD-10-CM | POA: Diagnosis not present

## 2017-06-01 DIAGNOSIS — M6281 Muscle weakness (generalized): Secondary | ICD-10-CM | POA: Diagnosis not present

## 2017-06-02 DIAGNOSIS — J449 Chronic obstructive pulmonary disease, unspecified: Secondary | ICD-10-CM | POA: Diagnosis not present

## 2017-06-02 DIAGNOSIS — M6281 Muscle weakness (generalized): Secondary | ICD-10-CM | POA: Diagnosis not present

## 2017-06-02 DIAGNOSIS — R278 Other lack of coordination: Secondary | ICD-10-CM | POA: Diagnosis not present

## 2017-06-03 DIAGNOSIS — M6281 Muscle weakness (generalized): Secondary | ICD-10-CM | POA: Diagnosis not present

## 2017-06-03 DIAGNOSIS — R278 Other lack of coordination: Secondary | ICD-10-CM | POA: Diagnosis not present

## 2017-06-03 DIAGNOSIS — J449 Chronic obstructive pulmonary disease, unspecified: Secondary | ICD-10-CM | POA: Diagnosis not present

## 2017-06-05 DIAGNOSIS — R278 Other lack of coordination: Secondary | ICD-10-CM | POA: Diagnosis not present

## 2017-06-05 DIAGNOSIS — J449 Chronic obstructive pulmonary disease, unspecified: Secondary | ICD-10-CM | POA: Diagnosis not present

## 2017-06-05 DIAGNOSIS — M6281 Muscle weakness (generalized): Secondary | ICD-10-CM | POA: Diagnosis not present

## 2017-06-07 DIAGNOSIS — K5900 Constipation, unspecified: Secondary | ICD-10-CM | POA: Diagnosis not present

## 2017-06-07 DIAGNOSIS — J449 Chronic obstructive pulmonary disease, unspecified: Secondary | ICD-10-CM | POA: Diagnosis not present

## 2017-06-07 DIAGNOSIS — I10 Essential (primary) hypertension: Secondary | ICD-10-CM | POA: Diagnosis not present

## 2017-06-07 DIAGNOSIS — M6281 Muscle weakness (generalized): Secondary | ICD-10-CM | POA: Diagnosis not present

## 2017-06-07 DIAGNOSIS — R278 Other lack of coordination: Secondary | ICD-10-CM | POA: Diagnosis not present

## 2017-06-07 DIAGNOSIS — K219 Gastro-esophageal reflux disease without esophagitis: Secondary | ICD-10-CM | POA: Diagnosis not present

## 2017-06-08 DIAGNOSIS — R278 Other lack of coordination: Secondary | ICD-10-CM | POA: Diagnosis not present

## 2017-06-08 DIAGNOSIS — Z79899 Other long term (current) drug therapy: Secondary | ICD-10-CM | POA: Diagnosis not present

## 2017-06-08 DIAGNOSIS — J449 Chronic obstructive pulmonary disease, unspecified: Secondary | ICD-10-CM | POA: Diagnosis not present

## 2017-06-08 DIAGNOSIS — M6281 Muscle weakness (generalized): Secondary | ICD-10-CM | POA: Diagnosis not present

## 2017-06-09 DIAGNOSIS — R278 Other lack of coordination: Secondary | ICD-10-CM | POA: Diagnosis not present

## 2017-06-09 DIAGNOSIS — J449 Chronic obstructive pulmonary disease, unspecified: Secondary | ICD-10-CM | POA: Diagnosis not present

## 2017-06-09 DIAGNOSIS — M6281 Muscle weakness (generalized): Secondary | ICD-10-CM | POA: Diagnosis not present

## 2017-06-10 DIAGNOSIS — M6281 Muscle weakness (generalized): Secondary | ICD-10-CM | POA: Diagnosis not present

## 2017-06-10 DIAGNOSIS — J449 Chronic obstructive pulmonary disease, unspecified: Secondary | ICD-10-CM | POA: Diagnosis not present

## 2017-06-10 DIAGNOSIS — R278 Other lack of coordination: Secondary | ICD-10-CM | POA: Diagnosis not present

## 2017-06-11 DIAGNOSIS — R278 Other lack of coordination: Secondary | ICD-10-CM | POA: Diagnosis not present

## 2017-06-11 DIAGNOSIS — M6281 Muscle weakness (generalized): Secondary | ICD-10-CM | POA: Diagnosis not present

## 2017-06-11 DIAGNOSIS — B351 Tinea unguium: Secondary | ICD-10-CM | POA: Diagnosis not present

## 2017-06-11 DIAGNOSIS — J449 Chronic obstructive pulmonary disease, unspecified: Secondary | ICD-10-CM | POA: Diagnosis not present

## 2017-06-11 DIAGNOSIS — R262 Difficulty in walking, not elsewhere classified: Secondary | ICD-10-CM | POA: Diagnosis not present

## 2017-06-11 DIAGNOSIS — I739 Peripheral vascular disease, unspecified: Secondary | ICD-10-CM | POA: Diagnosis not present

## 2017-06-12 DIAGNOSIS — J449 Chronic obstructive pulmonary disease, unspecified: Secondary | ICD-10-CM | POA: Diagnosis not present

## 2017-06-12 DIAGNOSIS — M6281 Muscle weakness (generalized): Secondary | ICD-10-CM | POA: Diagnosis not present

## 2017-06-12 DIAGNOSIS — R278 Other lack of coordination: Secondary | ICD-10-CM | POA: Diagnosis not present

## 2017-06-14 DIAGNOSIS — J449 Chronic obstructive pulmonary disease, unspecified: Secondary | ICD-10-CM | POA: Diagnosis not present

## 2017-06-14 DIAGNOSIS — R278 Other lack of coordination: Secondary | ICD-10-CM | POA: Diagnosis not present

## 2017-06-14 DIAGNOSIS — M6281 Muscle weakness (generalized): Secondary | ICD-10-CM | POA: Diagnosis not present

## 2017-06-15 DIAGNOSIS — J449 Chronic obstructive pulmonary disease, unspecified: Secondary | ICD-10-CM | POA: Diagnosis not present

## 2017-06-15 DIAGNOSIS — R278 Other lack of coordination: Secondary | ICD-10-CM | POA: Diagnosis not present

## 2017-06-15 DIAGNOSIS — M6281 Muscle weakness (generalized): Secondary | ICD-10-CM | POA: Diagnosis not present

## 2017-06-16 DIAGNOSIS — M6281 Muscle weakness (generalized): Secondary | ICD-10-CM | POA: Diagnosis not present

## 2017-06-16 DIAGNOSIS — R278 Other lack of coordination: Secondary | ICD-10-CM | POA: Diagnosis not present

## 2017-06-16 DIAGNOSIS — J449 Chronic obstructive pulmonary disease, unspecified: Secondary | ICD-10-CM | POA: Diagnosis not present

## 2017-06-17 DIAGNOSIS — R278 Other lack of coordination: Secondary | ICD-10-CM | POA: Diagnosis not present

## 2017-06-17 DIAGNOSIS — M6281 Muscle weakness (generalized): Secondary | ICD-10-CM | POA: Diagnosis not present

## 2017-06-17 DIAGNOSIS — J449 Chronic obstructive pulmonary disease, unspecified: Secondary | ICD-10-CM | POA: Diagnosis not present

## 2017-06-18 DIAGNOSIS — R278 Other lack of coordination: Secondary | ICD-10-CM | POA: Diagnosis not present

## 2017-06-18 DIAGNOSIS — M6281 Muscle weakness (generalized): Secondary | ICD-10-CM | POA: Diagnosis not present

## 2017-06-18 DIAGNOSIS — J449 Chronic obstructive pulmonary disease, unspecified: Secondary | ICD-10-CM | POA: Diagnosis not present

## 2017-06-21 DIAGNOSIS — R278 Other lack of coordination: Secondary | ICD-10-CM | POA: Diagnosis not present

## 2017-06-21 DIAGNOSIS — M6281 Muscle weakness (generalized): Secondary | ICD-10-CM | POA: Diagnosis not present

## 2017-06-21 DIAGNOSIS — J449 Chronic obstructive pulmonary disease, unspecified: Secondary | ICD-10-CM | POA: Diagnosis not present

## 2017-06-22 DIAGNOSIS — M6281 Muscle weakness (generalized): Secondary | ICD-10-CM | POA: Diagnosis not present

## 2017-06-22 DIAGNOSIS — J449 Chronic obstructive pulmonary disease, unspecified: Secondary | ICD-10-CM | POA: Diagnosis not present

## 2017-06-22 DIAGNOSIS — R278 Other lack of coordination: Secondary | ICD-10-CM | POA: Diagnosis not present

## 2017-06-23 DIAGNOSIS — R278 Other lack of coordination: Secondary | ICD-10-CM | POA: Diagnosis not present

## 2017-06-23 DIAGNOSIS — J449 Chronic obstructive pulmonary disease, unspecified: Secondary | ICD-10-CM | POA: Diagnosis not present

## 2017-06-23 DIAGNOSIS — M6281 Muscle weakness (generalized): Secondary | ICD-10-CM | POA: Diagnosis not present

## 2017-06-24 DIAGNOSIS — M6281 Muscle weakness (generalized): Secondary | ICD-10-CM | POA: Diagnosis not present

## 2017-06-24 DIAGNOSIS — R278 Other lack of coordination: Secondary | ICD-10-CM | POA: Diagnosis not present

## 2017-06-24 DIAGNOSIS — J449 Chronic obstructive pulmonary disease, unspecified: Secondary | ICD-10-CM | POA: Diagnosis not present

## 2017-06-24 DIAGNOSIS — G4700 Insomnia, unspecified: Secondary | ICD-10-CM | POA: Diagnosis not present

## 2017-06-25 DIAGNOSIS — M6281 Muscle weakness (generalized): Secondary | ICD-10-CM | POA: Diagnosis not present

## 2017-06-25 DIAGNOSIS — J449 Chronic obstructive pulmonary disease, unspecified: Secondary | ICD-10-CM | POA: Diagnosis not present

## 2017-06-25 DIAGNOSIS — R278 Other lack of coordination: Secondary | ICD-10-CM | POA: Diagnosis not present

## 2017-06-26 DIAGNOSIS — J449 Chronic obstructive pulmonary disease, unspecified: Secondary | ICD-10-CM | POA: Diagnosis not present

## 2017-06-26 DIAGNOSIS — M6281 Muscle weakness (generalized): Secondary | ICD-10-CM | POA: Diagnosis not present

## 2017-06-26 DIAGNOSIS — R278 Other lack of coordination: Secondary | ICD-10-CM | POA: Diagnosis not present

## 2017-06-28 DIAGNOSIS — R278 Other lack of coordination: Secondary | ICD-10-CM | POA: Diagnosis not present

## 2017-06-28 DIAGNOSIS — M6281 Muscle weakness (generalized): Secondary | ICD-10-CM | POA: Diagnosis not present

## 2017-06-28 DIAGNOSIS — J449 Chronic obstructive pulmonary disease, unspecified: Secondary | ICD-10-CM | POA: Diagnosis not present

## 2017-06-29 DIAGNOSIS — M6281 Muscle weakness (generalized): Secondary | ICD-10-CM | POA: Diagnosis not present

## 2017-06-29 DIAGNOSIS — J449 Chronic obstructive pulmonary disease, unspecified: Secondary | ICD-10-CM | POA: Diagnosis not present

## 2017-06-29 DIAGNOSIS — R278 Other lack of coordination: Secondary | ICD-10-CM | POA: Diagnosis not present

## 2017-06-30 DIAGNOSIS — R278 Other lack of coordination: Secondary | ICD-10-CM | POA: Diagnosis not present

## 2017-06-30 DIAGNOSIS — M6281 Muscle weakness (generalized): Secondary | ICD-10-CM | POA: Diagnosis not present

## 2017-06-30 DIAGNOSIS — J449 Chronic obstructive pulmonary disease, unspecified: Secondary | ICD-10-CM | POA: Diagnosis not present

## 2017-07-01 DIAGNOSIS — J449 Chronic obstructive pulmonary disease, unspecified: Secondary | ICD-10-CM | POA: Diagnosis not present

## 2017-07-01 DIAGNOSIS — M6281 Muscle weakness (generalized): Secondary | ICD-10-CM | POA: Diagnosis not present

## 2017-07-01 DIAGNOSIS — R278 Other lack of coordination: Secondary | ICD-10-CM | POA: Diagnosis not present

## 2017-07-02 DIAGNOSIS — M6281 Muscle weakness (generalized): Secondary | ICD-10-CM | POA: Diagnosis not present

## 2017-07-02 DIAGNOSIS — R278 Other lack of coordination: Secondary | ICD-10-CM | POA: Diagnosis not present

## 2017-07-02 DIAGNOSIS — J449 Chronic obstructive pulmonary disease, unspecified: Secondary | ICD-10-CM | POA: Diagnosis not present

## 2017-07-05 DIAGNOSIS — M6281 Muscle weakness (generalized): Secondary | ICD-10-CM | POA: Diagnosis not present

## 2017-07-05 DIAGNOSIS — R278 Other lack of coordination: Secondary | ICD-10-CM | POA: Diagnosis not present

## 2017-07-05 DIAGNOSIS — J449 Chronic obstructive pulmonary disease, unspecified: Secondary | ICD-10-CM | POA: Diagnosis not present

## 2017-07-06 DIAGNOSIS — M6281 Muscle weakness (generalized): Secondary | ICD-10-CM | POA: Diagnosis not present

## 2017-07-06 DIAGNOSIS — J449 Chronic obstructive pulmonary disease, unspecified: Secondary | ICD-10-CM | POA: Diagnosis not present

## 2017-07-06 DIAGNOSIS — R278 Other lack of coordination: Secondary | ICD-10-CM | POA: Diagnosis not present

## 2017-07-07 DIAGNOSIS — M6281 Muscle weakness (generalized): Secondary | ICD-10-CM | POA: Diagnosis not present

## 2017-07-07 DIAGNOSIS — J449 Chronic obstructive pulmonary disease, unspecified: Secondary | ICD-10-CM | POA: Diagnosis not present

## 2017-07-07 DIAGNOSIS — R278 Other lack of coordination: Secondary | ICD-10-CM | POA: Diagnosis not present

## 2017-07-08 DIAGNOSIS — R278 Other lack of coordination: Secondary | ICD-10-CM | POA: Diagnosis not present

## 2017-07-08 DIAGNOSIS — J449 Chronic obstructive pulmonary disease, unspecified: Secondary | ICD-10-CM | POA: Diagnosis not present

## 2017-07-08 DIAGNOSIS — M6281 Muscle weakness (generalized): Secondary | ICD-10-CM | POA: Diagnosis not present

## 2017-07-09 DIAGNOSIS — M6281 Muscle weakness (generalized): Secondary | ICD-10-CM | POA: Diagnosis not present

## 2017-07-09 DIAGNOSIS — J449 Chronic obstructive pulmonary disease, unspecified: Secondary | ICD-10-CM | POA: Diagnosis not present

## 2017-07-09 DIAGNOSIS — R278 Other lack of coordination: Secondary | ICD-10-CM | POA: Diagnosis not present

## 2017-08-04 DIAGNOSIS — G4700 Insomnia, unspecified: Secondary | ICD-10-CM | POA: Diagnosis not present

## 2017-08-04 DIAGNOSIS — F339 Major depressive disorder, recurrent, unspecified: Secondary | ICD-10-CM | POA: Diagnosis not present

## 2017-08-16 DIAGNOSIS — E669 Obesity, unspecified: Secondary | ICD-10-CM | POA: Diagnosis not present

## 2017-08-16 DIAGNOSIS — K219 Gastro-esophageal reflux disease without esophagitis: Secondary | ICD-10-CM | POA: Diagnosis not present

## 2017-08-16 DIAGNOSIS — M25511 Pain in right shoulder: Secondary | ICD-10-CM | POA: Diagnosis not present

## 2017-08-16 DIAGNOSIS — J449 Chronic obstructive pulmonary disease, unspecified: Secondary | ICD-10-CM | POA: Diagnosis not present

## 2017-08-20 DIAGNOSIS — B351 Tinea unguium: Secondary | ICD-10-CM | POA: Diagnosis not present

## 2017-08-20 DIAGNOSIS — I739 Peripheral vascular disease, unspecified: Secondary | ICD-10-CM | POA: Diagnosis not present

## 2017-08-24 DIAGNOSIS — I10 Essential (primary) hypertension: Secondary | ICD-10-CM | POA: Diagnosis not present

## 2017-08-27 DIAGNOSIS — R296 Repeated falls: Secondary | ICD-10-CM | POA: Diagnosis not present

## 2017-08-27 DIAGNOSIS — R278 Other lack of coordination: Secondary | ICD-10-CM | POA: Diagnosis not present

## 2017-08-27 DIAGNOSIS — R2689 Other abnormalities of gait and mobility: Secondary | ICD-10-CM | POA: Diagnosis not present

## 2017-08-27 DIAGNOSIS — J449 Chronic obstructive pulmonary disease, unspecified: Secondary | ICD-10-CM | POA: Diagnosis not present

## 2017-08-27 DIAGNOSIS — Z9181 History of falling: Secondary | ICD-10-CM | POA: Diagnosis not present

## 2017-08-27 DIAGNOSIS — M6281 Muscle weakness (generalized): Secondary | ICD-10-CM | POA: Diagnosis not present

## 2017-08-30 DIAGNOSIS — J449 Chronic obstructive pulmonary disease, unspecified: Secondary | ICD-10-CM | POA: Diagnosis not present

## 2017-08-30 DIAGNOSIS — Z9181 History of falling: Secondary | ICD-10-CM | POA: Diagnosis not present

## 2017-08-30 DIAGNOSIS — R2689 Other abnormalities of gait and mobility: Secondary | ICD-10-CM | POA: Diagnosis not present

## 2017-08-30 DIAGNOSIS — R296 Repeated falls: Secondary | ICD-10-CM | POA: Diagnosis not present

## 2017-08-30 DIAGNOSIS — M6281 Muscle weakness (generalized): Secondary | ICD-10-CM | POA: Diagnosis not present

## 2017-08-30 DIAGNOSIS — R278 Other lack of coordination: Secondary | ICD-10-CM | POA: Diagnosis not present

## 2017-08-31 DIAGNOSIS — R278 Other lack of coordination: Secondary | ICD-10-CM | POA: Diagnosis not present

## 2017-08-31 DIAGNOSIS — M6281 Muscle weakness (generalized): Secondary | ICD-10-CM | POA: Diagnosis not present

## 2017-08-31 DIAGNOSIS — R2689 Other abnormalities of gait and mobility: Secondary | ICD-10-CM | POA: Diagnosis not present

## 2017-08-31 DIAGNOSIS — J449 Chronic obstructive pulmonary disease, unspecified: Secondary | ICD-10-CM | POA: Diagnosis not present

## 2017-08-31 DIAGNOSIS — Z9181 History of falling: Secondary | ICD-10-CM | POA: Diagnosis not present

## 2017-08-31 DIAGNOSIS — R296 Repeated falls: Secondary | ICD-10-CM | POA: Diagnosis not present

## 2017-09-02 DIAGNOSIS — R296 Repeated falls: Secondary | ICD-10-CM | POA: Diagnosis not present

## 2017-09-02 DIAGNOSIS — R278 Other lack of coordination: Secondary | ICD-10-CM | POA: Diagnosis not present

## 2017-09-02 DIAGNOSIS — Z9181 History of falling: Secondary | ICD-10-CM | POA: Diagnosis not present

## 2017-09-02 DIAGNOSIS — R2689 Other abnormalities of gait and mobility: Secondary | ICD-10-CM | POA: Diagnosis not present

## 2017-09-02 DIAGNOSIS — M6281 Muscle weakness (generalized): Secondary | ICD-10-CM | POA: Diagnosis not present

## 2017-09-02 DIAGNOSIS — J449 Chronic obstructive pulmonary disease, unspecified: Secondary | ICD-10-CM | POA: Diagnosis not present

## 2017-09-03 DIAGNOSIS — R278 Other lack of coordination: Secondary | ICD-10-CM | POA: Diagnosis not present

## 2017-09-03 DIAGNOSIS — Z9181 History of falling: Secondary | ICD-10-CM | POA: Diagnosis not present

## 2017-09-03 DIAGNOSIS — R296 Repeated falls: Secondary | ICD-10-CM | POA: Diagnosis not present

## 2017-09-03 DIAGNOSIS — R2689 Other abnormalities of gait and mobility: Secondary | ICD-10-CM | POA: Diagnosis not present

## 2017-09-03 DIAGNOSIS — J449 Chronic obstructive pulmonary disease, unspecified: Secondary | ICD-10-CM | POA: Diagnosis not present

## 2017-09-03 DIAGNOSIS — M6281 Muscle weakness (generalized): Secondary | ICD-10-CM | POA: Diagnosis not present

## 2017-09-06 DIAGNOSIS — M6281 Muscle weakness (generalized): Secondary | ICD-10-CM | POA: Diagnosis not present

## 2017-09-06 DIAGNOSIS — Z9181 History of falling: Secondary | ICD-10-CM | POA: Diagnosis not present

## 2017-09-06 DIAGNOSIS — R2689 Other abnormalities of gait and mobility: Secondary | ICD-10-CM | POA: Diagnosis not present

## 2017-09-06 DIAGNOSIS — R296 Repeated falls: Secondary | ICD-10-CM | POA: Diagnosis not present

## 2017-09-06 DIAGNOSIS — J449 Chronic obstructive pulmonary disease, unspecified: Secondary | ICD-10-CM | POA: Diagnosis not present

## 2017-09-06 DIAGNOSIS — R278 Other lack of coordination: Secondary | ICD-10-CM | POA: Diagnosis not present

## 2017-09-07 DIAGNOSIS — Z9181 History of falling: Secondary | ICD-10-CM | POA: Diagnosis not present

## 2017-09-07 DIAGNOSIS — J449 Chronic obstructive pulmonary disease, unspecified: Secondary | ICD-10-CM | POA: Diagnosis not present

## 2017-09-07 DIAGNOSIS — R2689 Other abnormalities of gait and mobility: Secondary | ICD-10-CM | POA: Diagnosis not present

## 2017-09-07 DIAGNOSIS — R296 Repeated falls: Secondary | ICD-10-CM | POA: Diagnosis not present

## 2017-09-07 DIAGNOSIS — M6281 Muscle weakness (generalized): Secondary | ICD-10-CM | POA: Diagnosis not present

## 2017-09-07 DIAGNOSIS — R278 Other lack of coordination: Secondary | ICD-10-CM | POA: Diagnosis not present

## 2017-09-08 DIAGNOSIS — Z9181 History of falling: Secondary | ICD-10-CM | POA: Diagnosis not present

## 2017-09-08 DIAGNOSIS — R2689 Other abnormalities of gait and mobility: Secondary | ICD-10-CM | POA: Diagnosis not present

## 2017-09-08 DIAGNOSIS — R278 Other lack of coordination: Secondary | ICD-10-CM | POA: Diagnosis not present

## 2017-09-08 DIAGNOSIS — R296 Repeated falls: Secondary | ICD-10-CM | POA: Diagnosis not present

## 2017-09-08 DIAGNOSIS — M6281 Muscle weakness (generalized): Secondary | ICD-10-CM | POA: Diagnosis not present

## 2017-09-08 DIAGNOSIS — J449 Chronic obstructive pulmonary disease, unspecified: Secondary | ICD-10-CM | POA: Diagnosis not present

## 2017-09-09 DIAGNOSIS — R278 Other lack of coordination: Secondary | ICD-10-CM | POA: Diagnosis not present

## 2017-09-09 DIAGNOSIS — M6281 Muscle weakness (generalized): Secondary | ICD-10-CM | POA: Diagnosis not present

## 2017-09-09 DIAGNOSIS — R296 Repeated falls: Secondary | ICD-10-CM | POA: Diagnosis not present

## 2017-09-09 DIAGNOSIS — R2689 Other abnormalities of gait and mobility: Secondary | ICD-10-CM | POA: Diagnosis not present

## 2017-09-09 DIAGNOSIS — J449 Chronic obstructive pulmonary disease, unspecified: Secondary | ICD-10-CM | POA: Diagnosis not present

## 2017-09-09 DIAGNOSIS — Z9181 History of falling: Secondary | ICD-10-CM | POA: Diagnosis not present

## 2017-09-10 DIAGNOSIS — R278 Other lack of coordination: Secondary | ICD-10-CM | POA: Diagnosis not present

## 2017-09-10 DIAGNOSIS — Z9181 History of falling: Secondary | ICD-10-CM | POA: Diagnosis not present

## 2017-09-10 DIAGNOSIS — R2689 Other abnormalities of gait and mobility: Secondary | ICD-10-CM | POA: Diagnosis not present

## 2017-09-10 DIAGNOSIS — J449 Chronic obstructive pulmonary disease, unspecified: Secondary | ICD-10-CM | POA: Diagnosis not present

## 2017-09-10 DIAGNOSIS — R296 Repeated falls: Secondary | ICD-10-CM | POA: Diagnosis not present

## 2017-09-10 DIAGNOSIS — M6281 Muscle weakness (generalized): Secondary | ICD-10-CM | POA: Diagnosis not present

## 2017-09-13 DIAGNOSIS — R296 Repeated falls: Secondary | ICD-10-CM | POA: Diagnosis not present

## 2017-09-13 DIAGNOSIS — J449 Chronic obstructive pulmonary disease, unspecified: Secondary | ICD-10-CM | POA: Diagnosis not present

## 2017-09-13 DIAGNOSIS — Z9181 History of falling: Secondary | ICD-10-CM | POA: Diagnosis not present

## 2017-09-13 DIAGNOSIS — R2689 Other abnormalities of gait and mobility: Secondary | ICD-10-CM | POA: Diagnosis not present

## 2017-09-13 DIAGNOSIS — M6281 Muscle weakness (generalized): Secondary | ICD-10-CM | POA: Diagnosis not present

## 2017-09-13 DIAGNOSIS — R278 Other lack of coordination: Secondary | ICD-10-CM | POA: Diagnosis not present

## 2017-09-14 DIAGNOSIS — M6281 Muscle weakness (generalized): Secondary | ICD-10-CM | POA: Diagnosis not present

## 2017-09-14 DIAGNOSIS — Z9181 History of falling: Secondary | ICD-10-CM | POA: Diagnosis not present

## 2017-09-14 DIAGNOSIS — R2689 Other abnormalities of gait and mobility: Secondary | ICD-10-CM | POA: Diagnosis not present

## 2017-09-14 DIAGNOSIS — J449 Chronic obstructive pulmonary disease, unspecified: Secondary | ICD-10-CM | POA: Diagnosis not present

## 2017-09-14 DIAGNOSIS — R278 Other lack of coordination: Secondary | ICD-10-CM | POA: Diagnosis not present

## 2017-09-14 DIAGNOSIS — R296 Repeated falls: Secondary | ICD-10-CM | POA: Diagnosis not present

## 2017-09-15 DIAGNOSIS — M6281 Muscle weakness (generalized): Secondary | ICD-10-CM | POA: Diagnosis not present

## 2017-09-15 DIAGNOSIS — R278 Other lack of coordination: Secondary | ICD-10-CM | POA: Diagnosis not present

## 2017-09-15 DIAGNOSIS — R296 Repeated falls: Secondary | ICD-10-CM | POA: Diagnosis not present

## 2017-09-15 DIAGNOSIS — Z9181 History of falling: Secondary | ICD-10-CM | POA: Diagnosis not present

## 2017-09-15 DIAGNOSIS — J449 Chronic obstructive pulmonary disease, unspecified: Secondary | ICD-10-CM | POA: Diagnosis not present

## 2017-09-15 DIAGNOSIS — R2689 Other abnormalities of gait and mobility: Secondary | ICD-10-CM | POA: Diagnosis not present

## 2017-09-16 DIAGNOSIS — J449 Chronic obstructive pulmonary disease, unspecified: Secondary | ICD-10-CM | POA: Diagnosis not present

## 2017-09-16 DIAGNOSIS — R296 Repeated falls: Secondary | ICD-10-CM | POA: Diagnosis not present

## 2017-09-16 DIAGNOSIS — R278 Other lack of coordination: Secondary | ICD-10-CM | POA: Diagnosis not present

## 2017-09-16 DIAGNOSIS — R2689 Other abnormalities of gait and mobility: Secondary | ICD-10-CM | POA: Diagnosis not present

## 2017-09-16 DIAGNOSIS — M6281 Muscle weakness (generalized): Secondary | ICD-10-CM | POA: Diagnosis not present

## 2017-09-16 DIAGNOSIS — Z9181 History of falling: Secondary | ICD-10-CM | POA: Diagnosis not present

## 2017-09-17 DIAGNOSIS — R278 Other lack of coordination: Secondary | ICD-10-CM | POA: Diagnosis not present

## 2017-09-17 DIAGNOSIS — R296 Repeated falls: Secondary | ICD-10-CM | POA: Diagnosis not present

## 2017-09-17 DIAGNOSIS — M6281 Muscle weakness (generalized): Secondary | ICD-10-CM | POA: Diagnosis not present

## 2017-09-17 DIAGNOSIS — J449 Chronic obstructive pulmonary disease, unspecified: Secondary | ICD-10-CM | POA: Diagnosis not present

## 2017-09-17 DIAGNOSIS — R2689 Other abnormalities of gait and mobility: Secondary | ICD-10-CM | POA: Diagnosis not present

## 2017-09-17 DIAGNOSIS — Z9181 History of falling: Secondary | ICD-10-CM | POA: Diagnosis not present

## 2017-09-20 DIAGNOSIS — J449 Chronic obstructive pulmonary disease, unspecified: Secondary | ICD-10-CM | POA: Diagnosis not present

## 2017-09-20 DIAGNOSIS — Z9181 History of falling: Secondary | ICD-10-CM | POA: Diagnosis not present

## 2017-09-20 DIAGNOSIS — R2689 Other abnormalities of gait and mobility: Secondary | ICD-10-CM | POA: Diagnosis not present

## 2017-09-20 DIAGNOSIS — R296 Repeated falls: Secondary | ICD-10-CM | POA: Diagnosis not present

## 2017-09-20 DIAGNOSIS — R278 Other lack of coordination: Secondary | ICD-10-CM | POA: Diagnosis not present

## 2017-09-20 DIAGNOSIS — M6281 Muscle weakness (generalized): Secondary | ICD-10-CM | POA: Diagnosis not present

## 2017-09-21 DIAGNOSIS — R296 Repeated falls: Secondary | ICD-10-CM | POA: Diagnosis not present

## 2017-09-21 DIAGNOSIS — J449 Chronic obstructive pulmonary disease, unspecified: Secondary | ICD-10-CM | POA: Diagnosis not present

## 2017-09-21 DIAGNOSIS — R278 Other lack of coordination: Secondary | ICD-10-CM | POA: Diagnosis not present

## 2017-09-21 DIAGNOSIS — R2689 Other abnormalities of gait and mobility: Secondary | ICD-10-CM | POA: Diagnosis not present

## 2017-09-21 DIAGNOSIS — Z9181 History of falling: Secondary | ICD-10-CM | POA: Diagnosis not present

## 2017-09-21 DIAGNOSIS — M6281 Muscle weakness (generalized): Secondary | ICD-10-CM | POA: Diagnosis not present

## 2017-09-22 DIAGNOSIS — R2689 Other abnormalities of gait and mobility: Secondary | ICD-10-CM | POA: Diagnosis not present

## 2017-09-22 DIAGNOSIS — G4700 Insomnia, unspecified: Secondary | ICD-10-CM | POA: Diagnosis not present

## 2017-09-22 DIAGNOSIS — Z9181 History of falling: Secondary | ICD-10-CM | POA: Diagnosis not present

## 2017-09-22 DIAGNOSIS — R296 Repeated falls: Secondary | ICD-10-CM | POA: Diagnosis not present

## 2017-09-22 DIAGNOSIS — M6281 Muscle weakness (generalized): Secondary | ICD-10-CM | POA: Diagnosis not present

## 2017-09-22 DIAGNOSIS — J449 Chronic obstructive pulmonary disease, unspecified: Secondary | ICD-10-CM | POA: Diagnosis not present

## 2017-09-22 DIAGNOSIS — F339 Major depressive disorder, recurrent, unspecified: Secondary | ICD-10-CM | POA: Diagnosis not present

## 2017-09-22 DIAGNOSIS — R278 Other lack of coordination: Secondary | ICD-10-CM | POA: Diagnosis not present

## 2017-09-23 DIAGNOSIS — Z9181 History of falling: Secondary | ICD-10-CM | POA: Diagnosis not present

## 2017-09-23 DIAGNOSIS — M6281 Muscle weakness (generalized): Secondary | ICD-10-CM | POA: Diagnosis not present

## 2017-09-23 DIAGNOSIS — J449 Chronic obstructive pulmonary disease, unspecified: Secondary | ICD-10-CM | POA: Diagnosis not present

## 2017-09-23 DIAGNOSIS — R278 Other lack of coordination: Secondary | ICD-10-CM | POA: Diagnosis not present

## 2017-09-23 DIAGNOSIS — R296 Repeated falls: Secondary | ICD-10-CM | POA: Diagnosis not present

## 2017-09-23 DIAGNOSIS — R2689 Other abnormalities of gait and mobility: Secondary | ICD-10-CM | POA: Diagnosis not present

## 2017-09-24 DIAGNOSIS — R278 Other lack of coordination: Secondary | ICD-10-CM | POA: Diagnosis not present

## 2017-09-24 DIAGNOSIS — R296 Repeated falls: Secondary | ICD-10-CM | POA: Diagnosis not present

## 2017-09-24 DIAGNOSIS — J449 Chronic obstructive pulmonary disease, unspecified: Secondary | ICD-10-CM | POA: Diagnosis not present

## 2017-09-24 DIAGNOSIS — M6281 Muscle weakness (generalized): Secondary | ICD-10-CM | POA: Diagnosis not present

## 2017-09-24 DIAGNOSIS — Z9181 History of falling: Secondary | ICD-10-CM | POA: Diagnosis not present

## 2017-09-24 DIAGNOSIS — R2689 Other abnormalities of gait and mobility: Secondary | ICD-10-CM | POA: Diagnosis not present

## 2017-09-27 DIAGNOSIS — R296 Repeated falls: Secondary | ICD-10-CM | POA: Diagnosis not present

## 2017-09-27 DIAGNOSIS — R278 Other lack of coordination: Secondary | ICD-10-CM | POA: Diagnosis not present

## 2017-09-27 DIAGNOSIS — R2689 Other abnormalities of gait and mobility: Secondary | ICD-10-CM | POA: Diagnosis not present

## 2017-09-27 DIAGNOSIS — Z9181 History of falling: Secondary | ICD-10-CM | POA: Diagnosis not present

## 2017-09-27 DIAGNOSIS — M6281 Muscle weakness (generalized): Secondary | ICD-10-CM | POA: Diagnosis not present

## 2017-09-27 DIAGNOSIS — J449 Chronic obstructive pulmonary disease, unspecified: Secondary | ICD-10-CM | POA: Diagnosis not present

## 2017-09-28 DIAGNOSIS — M6281 Muscle weakness (generalized): Secondary | ICD-10-CM | POA: Diagnosis not present

## 2017-09-28 DIAGNOSIS — R296 Repeated falls: Secondary | ICD-10-CM | POA: Diagnosis not present

## 2017-09-28 DIAGNOSIS — Z9181 History of falling: Secondary | ICD-10-CM | POA: Diagnosis not present

## 2017-09-28 DIAGNOSIS — R2689 Other abnormalities of gait and mobility: Secondary | ICD-10-CM | POA: Diagnosis not present

## 2017-09-28 DIAGNOSIS — R278 Other lack of coordination: Secondary | ICD-10-CM | POA: Diagnosis not present

## 2017-09-28 DIAGNOSIS — J449 Chronic obstructive pulmonary disease, unspecified: Secondary | ICD-10-CM | POA: Diagnosis not present

## 2017-09-29 DIAGNOSIS — R296 Repeated falls: Secondary | ICD-10-CM | POA: Diagnosis not present

## 2017-09-29 DIAGNOSIS — M6281 Muscle weakness (generalized): Secondary | ICD-10-CM | POA: Diagnosis not present

## 2017-09-29 DIAGNOSIS — R278 Other lack of coordination: Secondary | ICD-10-CM | POA: Diagnosis not present

## 2017-09-29 DIAGNOSIS — Z9181 History of falling: Secondary | ICD-10-CM | POA: Diagnosis not present

## 2017-09-29 DIAGNOSIS — J449 Chronic obstructive pulmonary disease, unspecified: Secondary | ICD-10-CM | POA: Diagnosis not present

## 2017-09-29 DIAGNOSIS — R2689 Other abnormalities of gait and mobility: Secondary | ICD-10-CM | POA: Diagnosis not present

## 2017-09-30 DIAGNOSIS — R278 Other lack of coordination: Secondary | ICD-10-CM | POA: Diagnosis not present

## 2017-09-30 DIAGNOSIS — M6281 Muscle weakness (generalized): Secondary | ICD-10-CM | POA: Diagnosis not present

## 2017-09-30 DIAGNOSIS — R296 Repeated falls: Secondary | ICD-10-CM | POA: Diagnosis not present

## 2017-09-30 DIAGNOSIS — R2689 Other abnormalities of gait and mobility: Secondary | ICD-10-CM | POA: Diagnosis not present

## 2017-09-30 DIAGNOSIS — Z9181 History of falling: Secondary | ICD-10-CM | POA: Diagnosis not present

## 2017-09-30 DIAGNOSIS — J449 Chronic obstructive pulmonary disease, unspecified: Secondary | ICD-10-CM | POA: Diagnosis not present

## 2017-10-01 DIAGNOSIS — R278 Other lack of coordination: Secondary | ICD-10-CM | POA: Diagnosis not present

## 2017-10-01 DIAGNOSIS — R2689 Other abnormalities of gait and mobility: Secondary | ICD-10-CM | POA: Diagnosis not present

## 2017-10-01 DIAGNOSIS — M6281 Muscle weakness (generalized): Secondary | ICD-10-CM | POA: Diagnosis not present

## 2017-10-01 DIAGNOSIS — J449 Chronic obstructive pulmonary disease, unspecified: Secondary | ICD-10-CM | POA: Diagnosis not present

## 2017-10-01 DIAGNOSIS — R296 Repeated falls: Secondary | ICD-10-CM | POA: Diagnosis not present

## 2017-10-01 DIAGNOSIS — Z9181 History of falling: Secondary | ICD-10-CM | POA: Diagnosis not present

## 2017-10-04 DIAGNOSIS — Z9181 History of falling: Secondary | ICD-10-CM | POA: Diagnosis not present

## 2017-10-04 DIAGNOSIS — R296 Repeated falls: Secondary | ICD-10-CM | POA: Diagnosis not present

## 2017-10-04 DIAGNOSIS — J449 Chronic obstructive pulmonary disease, unspecified: Secondary | ICD-10-CM | POA: Diagnosis not present

## 2017-10-04 DIAGNOSIS — M6281 Muscle weakness (generalized): Secondary | ICD-10-CM | POA: Diagnosis not present

## 2017-10-04 DIAGNOSIS — R2689 Other abnormalities of gait and mobility: Secondary | ICD-10-CM | POA: Diagnosis not present

## 2017-10-04 DIAGNOSIS — R278 Other lack of coordination: Secondary | ICD-10-CM | POA: Diagnosis not present

## 2017-10-05 DIAGNOSIS — M6281 Muscle weakness (generalized): Secondary | ICD-10-CM | POA: Diagnosis not present

## 2017-10-05 DIAGNOSIS — R2689 Other abnormalities of gait and mobility: Secondary | ICD-10-CM | POA: Diagnosis not present

## 2017-10-05 DIAGNOSIS — R278 Other lack of coordination: Secondary | ICD-10-CM | POA: Diagnosis not present

## 2017-10-05 DIAGNOSIS — R296 Repeated falls: Secondary | ICD-10-CM | POA: Diagnosis not present

## 2017-10-05 DIAGNOSIS — Z9181 History of falling: Secondary | ICD-10-CM | POA: Diagnosis not present

## 2017-10-05 DIAGNOSIS — J449 Chronic obstructive pulmonary disease, unspecified: Secondary | ICD-10-CM | POA: Diagnosis not present

## 2017-10-06 DIAGNOSIS — Z9181 History of falling: Secondary | ICD-10-CM | POA: Diagnosis not present

## 2017-10-06 DIAGNOSIS — R2689 Other abnormalities of gait and mobility: Secondary | ICD-10-CM | POA: Diagnosis not present

## 2017-10-06 DIAGNOSIS — M6281 Muscle weakness (generalized): Secondary | ICD-10-CM | POA: Diagnosis not present

## 2017-10-06 DIAGNOSIS — R296 Repeated falls: Secondary | ICD-10-CM | POA: Diagnosis not present

## 2017-10-06 DIAGNOSIS — J449 Chronic obstructive pulmonary disease, unspecified: Secondary | ICD-10-CM | POA: Diagnosis not present

## 2017-10-06 DIAGNOSIS — R278 Other lack of coordination: Secondary | ICD-10-CM | POA: Diagnosis not present

## 2017-10-07 DIAGNOSIS — M6281 Muscle weakness (generalized): Secondary | ICD-10-CM | POA: Diagnosis not present

## 2017-10-07 DIAGNOSIS — J449 Chronic obstructive pulmonary disease, unspecified: Secondary | ICD-10-CM | POA: Diagnosis not present

## 2017-10-07 DIAGNOSIS — R296 Repeated falls: Secondary | ICD-10-CM | POA: Diagnosis not present

## 2017-10-07 DIAGNOSIS — R2689 Other abnormalities of gait and mobility: Secondary | ICD-10-CM | POA: Diagnosis not present

## 2017-10-07 DIAGNOSIS — Z9181 History of falling: Secondary | ICD-10-CM | POA: Diagnosis not present

## 2017-10-07 DIAGNOSIS — R278 Other lack of coordination: Secondary | ICD-10-CM | POA: Diagnosis not present

## 2017-10-08 DIAGNOSIS — M6281 Muscle weakness (generalized): Secondary | ICD-10-CM | POA: Diagnosis not present

## 2017-10-08 DIAGNOSIS — R278 Other lack of coordination: Secondary | ICD-10-CM | POA: Diagnosis not present

## 2017-10-08 DIAGNOSIS — R2689 Other abnormalities of gait and mobility: Secondary | ICD-10-CM | POA: Diagnosis not present

## 2017-10-08 DIAGNOSIS — Z9181 History of falling: Secondary | ICD-10-CM | POA: Diagnosis not present

## 2017-10-08 DIAGNOSIS — J449 Chronic obstructive pulmonary disease, unspecified: Secondary | ICD-10-CM | POA: Diagnosis not present

## 2017-10-08 DIAGNOSIS — R296 Repeated falls: Secondary | ICD-10-CM | POA: Diagnosis not present

## 2017-10-11 DIAGNOSIS — G4700 Insomnia, unspecified: Secondary | ICD-10-CM | POA: Diagnosis not present

## 2017-10-11 DIAGNOSIS — R278 Other lack of coordination: Secondary | ICD-10-CM | POA: Diagnosis not present

## 2017-10-11 DIAGNOSIS — R296 Repeated falls: Secondary | ICD-10-CM | POA: Diagnosis not present

## 2017-10-11 DIAGNOSIS — J449 Chronic obstructive pulmonary disease, unspecified: Secondary | ICD-10-CM | POA: Diagnosis not present

## 2017-10-11 DIAGNOSIS — Z9181 History of falling: Secondary | ICD-10-CM | POA: Diagnosis not present

## 2017-10-11 DIAGNOSIS — M6281 Muscle weakness (generalized): Secondary | ICD-10-CM | POA: Diagnosis not present

## 2017-10-11 DIAGNOSIS — K219 Gastro-esophageal reflux disease without esophagitis: Secondary | ICD-10-CM | POA: Diagnosis not present

## 2017-10-11 DIAGNOSIS — I10 Essential (primary) hypertension: Secondary | ICD-10-CM | POA: Diagnosis not present

## 2017-10-11 DIAGNOSIS — R2689 Other abnormalities of gait and mobility: Secondary | ICD-10-CM | POA: Diagnosis not present

## 2017-10-12 DIAGNOSIS — R2689 Other abnormalities of gait and mobility: Secondary | ICD-10-CM | POA: Diagnosis not present

## 2017-10-12 DIAGNOSIS — Z9181 History of falling: Secondary | ICD-10-CM | POA: Diagnosis not present

## 2017-10-12 DIAGNOSIS — M25561 Pain in right knee: Secondary | ICD-10-CM | POA: Diagnosis not present

## 2017-10-12 DIAGNOSIS — J449 Chronic obstructive pulmonary disease, unspecified: Secondary | ICD-10-CM | POA: Diagnosis not present

## 2017-10-12 DIAGNOSIS — E039 Hypothyroidism, unspecified: Secondary | ICD-10-CM | POA: Diagnosis not present

## 2017-10-12 DIAGNOSIS — R278 Other lack of coordination: Secondary | ICD-10-CM | POA: Diagnosis not present

## 2017-10-12 DIAGNOSIS — M25511 Pain in right shoulder: Secondary | ICD-10-CM | POA: Diagnosis not present

## 2017-10-12 DIAGNOSIS — E559 Vitamin D deficiency, unspecified: Secondary | ICD-10-CM | POA: Diagnosis not present

## 2017-10-12 DIAGNOSIS — R296 Repeated falls: Secondary | ICD-10-CM | POA: Diagnosis not present

## 2017-10-12 DIAGNOSIS — M6281 Muscle weakness (generalized): Secondary | ICD-10-CM | POA: Diagnosis not present

## 2017-10-13 DIAGNOSIS — R296 Repeated falls: Secondary | ICD-10-CM | POA: Diagnosis not present

## 2017-10-13 DIAGNOSIS — Z9181 History of falling: Secondary | ICD-10-CM | POA: Diagnosis not present

## 2017-10-13 DIAGNOSIS — M6281 Muscle weakness (generalized): Secondary | ICD-10-CM | POA: Diagnosis not present

## 2017-10-13 DIAGNOSIS — R278 Other lack of coordination: Secondary | ICD-10-CM | POA: Diagnosis not present

## 2017-10-13 DIAGNOSIS — R2689 Other abnormalities of gait and mobility: Secondary | ICD-10-CM | POA: Diagnosis not present

## 2017-10-13 DIAGNOSIS — J449 Chronic obstructive pulmonary disease, unspecified: Secondary | ICD-10-CM | POA: Diagnosis not present

## 2017-10-14 DIAGNOSIS — M25511 Pain in right shoulder: Secondary | ICD-10-CM | POA: Diagnosis not present

## 2017-10-14 DIAGNOSIS — R296 Repeated falls: Secondary | ICD-10-CM | POA: Diagnosis not present

## 2017-10-14 DIAGNOSIS — R278 Other lack of coordination: Secondary | ICD-10-CM | POA: Diagnosis not present

## 2017-10-14 DIAGNOSIS — Z9181 History of falling: Secondary | ICD-10-CM | POA: Diagnosis not present

## 2017-10-14 DIAGNOSIS — R2689 Other abnormalities of gait and mobility: Secondary | ICD-10-CM | POA: Diagnosis not present

## 2017-10-14 DIAGNOSIS — J449 Chronic obstructive pulmonary disease, unspecified: Secondary | ICD-10-CM | POA: Diagnosis not present

## 2017-10-14 DIAGNOSIS — M25561 Pain in right knee: Secondary | ICD-10-CM | POA: Diagnosis not present

## 2017-10-14 DIAGNOSIS — M6281 Muscle weakness (generalized): Secondary | ICD-10-CM | POA: Diagnosis not present

## 2017-10-15 DIAGNOSIS — R278 Other lack of coordination: Secondary | ICD-10-CM | POA: Diagnosis not present

## 2017-10-15 DIAGNOSIS — R2689 Other abnormalities of gait and mobility: Secondary | ICD-10-CM | POA: Diagnosis not present

## 2017-10-15 DIAGNOSIS — M6281 Muscle weakness (generalized): Secondary | ICD-10-CM | POA: Diagnosis not present

## 2017-10-15 DIAGNOSIS — J449 Chronic obstructive pulmonary disease, unspecified: Secondary | ICD-10-CM | POA: Diagnosis not present

## 2017-10-15 DIAGNOSIS — Z9181 History of falling: Secondary | ICD-10-CM | POA: Diagnosis not present

## 2017-10-15 DIAGNOSIS — R296 Repeated falls: Secondary | ICD-10-CM | POA: Diagnosis not present

## 2017-10-18 DIAGNOSIS — Z9181 History of falling: Secondary | ICD-10-CM | POA: Diagnosis not present

## 2017-10-18 DIAGNOSIS — M25511 Pain in right shoulder: Secondary | ICD-10-CM | POA: Diagnosis not present

## 2017-10-18 DIAGNOSIS — R296 Repeated falls: Secondary | ICD-10-CM | POA: Diagnosis not present

## 2017-10-18 DIAGNOSIS — J449 Chronic obstructive pulmonary disease, unspecified: Secondary | ICD-10-CM | POA: Diagnosis not present

## 2017-10-18 DIAGNOSIS — R2689 Other abnormalities of gait and mobility: Secondary | ICD-10-CM | POA: Diagnosis not present

## 2017-10-18 DIAGNOSIS — M25561 Pain in right knee: Secondary | ICD-10-CM | POA: Diagnosis not present

## 2017-10-18 DIAGNOSIS — M6281 Muscle weakness (generalized): Secondary | ICD-10-CM | POA: Diagnosis not present

## 2017-10-18 DIAGNOSIS — R278 Other lack of coordination: Secondary | ICD-10-CM | POA: Diagnosis not present

## 2017-10-19 DIAGNOSIS — R2689 Other abnormalities of gait and mobility: Secondary | ICD-10-CM | POA: Diagnosis not present

## 2017-10-19 DIAGNOSIS — J449 Chronic obstructive pulmonary disease, unspecified: Secondary | ICD-10-CM | POA: Diagnosis not present

## 2017-10-19 DIAGNOSIS — R278 Other lack of coordination: Secondary | ICD-10-CM | POA: Diagnosis not present

## 2017-10-19 DIAGNOSIS — R296 Repeated falls: Secondary | ICD-10-CM | POA: Diagnosis not present

## 2017-10-19 DIAGNOSIS — M6281 Muscle weakness (generalized): Secondary | ICD-10-CM | POA: Diagnosis not present

## 2017-10-19 DIAGNOSIS — Z9181 History of falling: Secondary | ICD-10-CM | POA: Diagnosis not present

## 2017-10-20 DIAGNOSIS — Z9181 History of falling: Secondary | ICD-10-CM | POA: Diagnosis not present

## 2017-10-20 DIAGNOSIS — R278 Other lack of coordination: Secondary | ICD-10-CM | POA: Diagnosis not present

## 2017-10-20 DIAGNOSIS — R296 Repeated falls: Secondary | ICD-10-CM | POA: Diagnosis not present

## 2017-10-20 DIAGNOSIS — R2689 Other abnormalities of gait and mobility: Secondary | ICD-10-CM | POA: Diagnosis not present

## 2017-10-20 DIAGNOSIS — J449 Chronic obstructive pulmonary disease, unspecified: Secondary | ICD-10-CM | POA: Diagnosis not present

## 2017-10-20 DIAGNOSIS — M6281 Muscle weakness (generalized): Secondary | ICD-10-CM | POA: Diagnosis not present

## 2017-10-21 DIAGNOSIS — M6281 Muscle weakness (generalized): Secondary | ICD-10-CM | POA: Diagnosis not present

## 2017-10-21 DIAGNOSIS — Z9181 History of falling: Secondary | ICD-10-CM | POA: Diagnosis not present

## 2017-10-21 DIAGNOSIS — R278 Other lack of coordination: Secondary | ICD-10-CM | POA: Diagnosis not present

## 2017-10-21 DIAGNOSIS — R296 Repeated falls: Secondary | ICD-10-CM | POA: Diagnosis not present

## 2017-10-21 DIAGNOSIS — J449 Chronic obstructive pulmonary disease, unspecified: Secondary | ICD-10-CM | POA: Diagnosis not present

## 2017-10-21 DIAGNOSIS — R2689 Other abnormalities of gait and mobility: Secondary | ICD-10-CM | POA: Diagnosis not present

## 2017-10-22 DIAGNOSIS — R296 Repeated falls: Secondary | ICD-10-CM | POA: Diagnosis not present

## 2017-10-22 DIAGNOSIS — R278 Other lack of coordination: Secondary | ICD-10-CM | POA: Diagnosis not present

## 2017-10-22 DIAGNOSIS — Z9181 History of falling: Secondary | ICD-10-CM | POA: Diagnosis not present

## 2017-10-22 DIAGNOSIS — J449 Chronic obstructive pulmonary disease, unspecified: Secondary | ICD-10-CM | POA: Diagnosis not present

## 2017-10-22 DIAGNOSIS — M25561 Pain in right knee: Secondary | ICD-10-CM | POA: Diagnosis not present

## 2017-10-22 DIAGNOSIS — M6281 Muscle weakness (generalized): Secondary | ICD-10-CM | POA: Diagnosis not present

## 2017-10-22 DIAGNOSIS — R2689 Other abnormalities of gait and mobility: Secondary | ICD-10-CM | POA: Diagnosis not present

## 2017-10-22 DIAGNOSIS — M25511 Pain in right shoulder: Secondary | ICD-10-CM | POA: Diagnosis not present

## 2017-10-23 DIAGNOSIS — R296 Repeated falls: Secondary | ICD-10-CM | POA: Diagnosis not present

## 2017-10-23 DIAGNOSIS — M6281 Muscle weakness (generalized): Secondary | ICD-10-CM | POA: Diagnosis not present

## 2017-10-23 DIAGNOSIS — Z9181 History of falling: Secondary | ICD-10-CM | POA: Diagnosis not present

## 2017-10-23 DIAGNOSIS — R2689 Other abnormalities of gait and mobility: Secondary | ICD-10-CM | POA: Diagnosis not present

## 2017-10-23 DIAGNOSIS — J449 Chronic obstructive pulmonary disease, unspecified: Secondary | ICD-10-CM | POA: Diagnosis not present

## 2017-10-23 DIAGNOSIS — R278 Other lack of coordination: Secondary | ICD-10-CM | POA: Diagnosis not present

## 2017-10-26 DIAGNOSIS — R2689 Other abnormalities of gait and mobility: Secondary | ICD-10-CM | POA: Diagnosis not present

## 2017-10-26 DIAGNOSIS — Z9181 History of falling: Secondary | ICD-10-CM | POA: Diagnosis not present

## 2017-10-26 DIAGNOSIS — J449 Chronic obstructive pulmonary disease, unspecified: Secondary | ICD-10-CM | POA: Diagnosis not present

## 2017-10-26 DIAGNOSIS — M6281 Muscle weakness (generalized): Secondary | ICD-10-CM | POA: Diagnosis not present

## 2017-10-26 DIAGNOSIS — R296 Repeated falls: Secondary | ICD-10-CM | POA: Diagnosis not present

## 2017-10-26 DIAGNOSIS — R278 Other lack of coordination: Secondary | ICD-10-CM | POA: Diagnosis not present

## 2017-10-27 DIAGNOSIS — R296 Repeated falls: Secondary | ICD-10-CM | POA: Diagnosis not present

## 2017-10-27 DIAGNOSIS — J449 Chronic obstructive pulmonary disease, unspecified: Secondary | ICD-10-CM | POA: Diagnosis not present

## 2017-10-27 DIAGNOSIS — R278 Other lack of coordination: Secondary | ICD-10-CM | POA: Diagnosis not present

## 2017-10-27 DIAGNOSIS — R2689 Other abnormalities of gait and mobility: Secondary | ICD-10-CM | POA: Diagnosis not present

## 2017-10-27 DIAGNOSIS — M6281 Muscle weakness (generalized): Secondary | ICD-10-CM | POA: Diagnosis not present

## 2017-10-27 DIAGNOSIS — Z9181 History of falling: Secondary | ICD-10-CM | POA: Diagnosis not present

## 2017-10-28 DIAGNOSIS — R2689 Other abnormalities of gait and mobility: Secondary | ICD-10-CM | POA: Diagnosis not present

## 2017-10-28 DIAGNOSIS — R278 Other lack of coordination: Secondary | ICD-10-CM | POA: Diagnosis not present

## 2017-10-28 DIAGNOSIS — J449 Chronic obstructive pulmonary disease, unspecified: Secondary | ICD-10-CM | POA: Diagnosis not present

## 2017-10-28 DIAGNOSIS — M6281 Muscle weakness (generalized): Secondary | ICD-10-CM | POA: Diagnosis not present

## 2017-10-28 DIAGNOSIS — Z9181 History of falling: Secondary | ICD-10-CM | POA: Diagnosis not present

## 2017-10-28 DIAGNOSIS — R296 Repeated falls: Secondary | ICD-10-CM | POA: Diagnosis not present

## 2017-10-29 DIAGNOSIS — R278 Other lack of coordination: Secondary | ICD-10-CM | POA: Diagnosis not present

## 2017-10-29 DIAGNOSIS — R2689 Other abnormalities of gait and mobility: Secondary | ICD-10-CM | POA: Diagnosis not present

## 2017-10-29 DIAGNOSIS — Z9181 History of falling: Secondary | ICD-10-CM | POA: Diagnosis not present

## 2017-10-29 DIAGNOSIS — R296 Repeated falls: Secondary | ICD-10-CM | POA: Diagnosis not present

## 2017-10-29 DIAGNOSIS — M6281 Muscle weakness (generalized): Secondary | ICD-10-CM | POA: Diagnosis not present

## 2017-10-29 DIAGNOSIS — J449 Chronic obstructive pulmonary disease, unspecified: Secondary | ICD-10-CM | POA: Diagnosis not present

## 2017-11-01 DIAGNOSIS — Z9181 History of falling: Secondary | ICD-10-CM | POA: Diagnosis not present

## 2017-11-01 DIAGNOSIS — R2689 Other abnormalities of gait and mobility: Secondary | ICD-10-CM | POA: Diagnosis not present

## 2017-11-01 DIAGNOSIS — R278 Other lack of coordination: Secondary | ICD-10-CM | POA: Diagnosis not present

## 2017-11-01 DIAGNOSIS — J449 Chronic obstructive pulmonary disease, unspecified: Secondary | ICD-10-CM | POA: Diagnosis not present

## 2017-11-01 DIAGNOSIS — R296 Repeated falls: Secondary | ICD-10-CM | POA: Diagnosis not present

## 2017-11-01 DIAGNOSIS — M6281 Muscle weakness (generalized): Secondary | ICD-10-CM | POA: Diagnosis not present

## 2017-11-02 DIAGNOSIS — J449 Chronic obstructive pulmonary disease, unspecified: Secondary | ICD-10-CM | POA: Diagnosis not present

## 2017-11-02 DIAGNOSIS — R296 Repeated falls: Secondary | ICD-10-CM | POA: Diagnosis not present

## 2017-11-02 DIAGNOSIS — M6281 Muscle weakness (generalized): Secondary | ICD-10-CM | POA: Diagnosis not present

## 2017-11-02 DIAGNOSIS — Z9181 History of falling: Secondary | ICD-10-CM | POA: Diagnosis not present

## 2017-11-02 DIAGNOSIS — R278 Other lack of coordination: Secondary | ICD-10-CM | POA: Diagnosis not present

## 2017-11-02 DIAGNOSIS — R2689 Other abnormalities of gait and mobility: Secondary | ICD-10-CM | POA: Diagnosis not present

## 2017-11-03 DIAGNOSIS — M6281 Muscle weakness (generalized): Secondary | ICD-10-CM | POA: Diagnosis not present

## 2017-11-03 DIAGNOSIS — R2689 Other abnormalities of gait and mobility: Secondary | ICD-10-CM | POA: Diagnosis not present

## 2017-11-03 DIAGNOSIS — J449 Chronic obstructive pulmonary disease, unspecified: Secondary | ICD-10-CM | POA: Diagnosis not present

## 2017-11-03 DIAGNOSIS — Z9181 History of falling: Secondary | ICD-10-CM | POA: Diagnosis not present

## 2017-11-03 DIAGNOSIS — R296 Repeated falls: Secondary | ICD-10-CM | POA: Diagnosis not present

## 2017-11-03 DIAGNOSIS — R278 Other lack of coordination: Secondary | ICD-10-CM | POA: Diagnosis not present

## 2017-11-04 DIAGNOSIS — J449 Chronic obstructive pulmonary disease, unspecified: Secondary | ICD-10-CM | POA: Diagnosis not present

## 2017-11-04 DIAGNOSIS — M6281 Muscle weakness (generalized): Secondary | ICD-10-CM | POA: Diagnosis not present

## 2017-11-04 DIAGNOSIS — R296 Repeated falls: Secondary | ICD-10-CM | POA: Diagnosis not present

## 2017-11-04 DIAGNOSIS — R278 Other lack of coordination: Secondary | ICD-10-CM | POA: Diagnosis not present

## 2017-11-04 DIAGNOSIS — R2689 Other abnormalities of gait and mobility: Secondary | ICD-10-CM | POA: Diagnosis not present

## 2017-11-04 DIAGNOSIS — Z9181 History of falling: Secondary | ICD-10-CM | POA: Diagnosis not present

## 2017-11-05 DIAGNOSIS — M6281 Muscle weakness (generalized): Secondary | ICD-10-CM | POA: Diagnosis not present

## 2017-11-05 DIAGNOSIS — Z9181 History of falling: Secondary | ICD-10-CM | POA: Diagnosis not present

## 2017-11-05 DIAGNOSIS — R278 Other lack of coordination: Secondary | ICD-10-CM | POA: Diagnosis not present

## 2017-11-05 DIAGNOSIS — R2689 Other abnormalities of gait and mobility: Secondary | ICD-10-CM | POA: Diagnosis not present

## 2017-11-05 DIAGNOSIS — R296 Repeated falls: Secondary | ICD-10-CM | POA: Diagnosis not present

## 2017-11-05 DIAGNOSIS — J449 Chronic obstructive pulmonary disease, unspecified: Secondary | ICD-10-CM | POA: Diagnosis not present

## 2017-11-08 DIAGNOSIS — M6281 Muscle weakness (generalized): Secondary | ICD-10-CM | POA: Diagnosis not present

## 2017-11-08 DIAGNOSIS — J449 Chronic obstructive pulmonary disease, unspecified: Secondary | ICD-10-CM | POA: Diagnosis not present

## 2017-11-08 DIAGNOSIS — R296 Repeated falls: Secondary | ICD-10-CM | POA: Diagnosis not present

## 2017-11-08 DIAGNOSIS — R2689 Other abnormalities of gait and mobility: Secondary | ICD-10-CM | POA: Diagnosis not present

## 2017-11-08 DIAGNOSIS — R278 Other lack of coordination: Secondary | ICD-10-CM | POA: Diagnosis not present

## 2017-11-08 DIAGNOSIS — Z9181 History of falling: Secondary | ICD-10-CM | POA: Diagnosis not present

## 2017-11-09 DIAGNOSIS — M6281 Muscle weakness (generalized): Secondary | ICD-10-CM | POA: Diagnosis not present

## 2017-11-09 DIAGNOSIS — R2689 Other abnormalities of gait and mobility: Secondary | ICD-10-CM | POA: Diagnosis not present

## 2017-11-09 DIAGNOSIS — Z9181 History of falling: Secondary | ICD-10-CM | POA: Diagnosis not present

## 2017-11-09 DIAGNOSIS — R278 Other lack of coordination: Secondary | ICD-10-CM | POA: Diagnosis not present

## 2017-11-09 DIAGNOSIS — J449 Chronic obstructive pulmonary disease, unspecified: Secondary | ICD-10-CM | POA: Diagnosis not present

## 2017-11-09 DIAGNOSIS — R296 Repeated falls: Secondary | ICD-10-CM | POA: Diagnosis not present

## 2017-11-10 DIAGNOSIS — R2689 Other abnormalities of gait and mobility: Secondary | ICD-10-CM | POA: Diagnosis not present

## 2017-11-10 DIAGNOSIS — M6281 Muscle weakness (generalized): Secondary | ICD-10-CM | POA: Diagnosis not present

## 2017-11-10 DIAGNOSIS — J449 Chronic obstructive pulmonary disease, unspecified: Secondary | ICD-10-CM | POA: Diagnosis not present

## 2017-11-10 DIAGNOSIS — R278 Other lack of coordination: Secondary | ICD-10-CM | POA: Diagnosis not present

## 2017-11-10 DIAGNOSIS — F339 Major depressive disorder, recurrent, unspecified: Secondary | ICD-10-CM | POA: Diagnosis not present

## 2017-11-10 DIAGNOSIS — R296 Repeated falls: Secondary | ICD-10-CM | POA: Diagnosis not present

## 2017-11-10 DIAGNOSIS — F0390 Unspecified dementia without behavioral disturbance: Secondary | ICD-10-CM | POA: Diagnosis not present

## 2017-11-10 DIAGNOSIS — Z9181 History of falling: Secondary | ICD-10-CM | POA: Diagnosis not present

## 2017-11-10 DIAGNOSIS — G4700 Insomnia, unspecified: Secondary | ICD-10-CM | POA: Diagnosis not present

## 2017-11-11 DIAGNOSIS — R2689 Other abnormalities of gait and mobility: Secondary | ICD-10-CM | POA: Diagnosis not present

## 2017-11-11 DIAGNOSIS — M6281 Muscle weakness (generalized): Secondary | ICD-10-CM | POA: Diagnosis not present

## 2017-11-11 DIAGNOSIS — R278 Other lack of coordination: Secondary | ICD-10-CM | POA: Diagnosis not present

## 2017-11-11 DIAGNOSIS — Z9181 History of falling: Secondary | ICD-10-CM | POA: Diagnosis not present

## 2017-11-11 DIAGNOSIS — J449 Chronic obstructive pulmonary disease, unspecified: Secondary | ICD-10-CM | POA: Diagnosis not present

## 2017-11-11 DIAGNOSIS — R296 Repeated falls: Secondary | ICD-10-CM | POA: Diagnosis not present

## 2017-11-12 DIAGNOSIS — R278 Other lack of coordination: Secondary | ICD-10-CM | POA: Diagnosis not present

## 2017-11-12 DIAGNOSIS — M6281 Muscle weakness (generalized): Secondary | ICD-10-CM | POA: Diagnosis not present

## 2017-11-12 DIAGNOSIS — R296 Repeated falls: Secondary | ICD-10-CM | POA: Diagnosis not present

## 2017-11-12 DIAGNOSIS — Z9181 History of falling: Secondary | ICD-10-CM | POA: Diagnosis not present

## 2017-11-12 DIAGNOSIS — J449 Chronic obstructive pulmonary disease, unspecified: Secondary | ICD-10-CM | POA: Diagnosis not present

## 2017-11-12 DIAGNOSIS — R2689 Other abnormalities of gait and mobility: Secondary | ICD-10-CM | POA: Diagnosis not present

## 2017-11-15 DIAGNOSIS — Z9181 History of falling: Secondary | ICD-10-CM | POA: Diagnosis not present

## 2017-11-15 DIAGNOSIS — R2689 Other abnormalities of gait and mobility: Secondary | ICD-10-CM | POA: Diagnosis not present

## 2017-11-15 DIAGNOSIS — R296 Repeated falls: Secondary | ICD-10-CM | POA: Diagnosis not present

## 2017-11-15 DIAGNOSIS — J449 Chronic obstructive pulmonary disease, unspecified: Secondary | ICD-10-CM | POA: Diagnosis not present

## 2017-11-15 DIAGNOSIS — M6281 Muscle weakness (generalized): Secondary | ICD-10-CM | POA: Diagnosis not present

## 2017-11-15 DIAGNOSIS — R278 Other lack of coordination: Secondary | ICD-10-CM | POA: Diagnosis not present

## 2017-11-16 DIAGNOSIS — M6281 Muscle weakness (generalized): Secondary | ICD-10-CM | POA: Diagnosis not present

## 2017-11-16 DIAGNOSIS — Z9181 History of falling: Secondary | ICD-10-CM | POA: Diagnosis not present

## 2017-11-16 DIAGNOSIS — J449 Chronic obstructive pulmonary disease, unspecified: Secondary | ICD-10-CM | POA: Diagnosis not present

## 2017-11-16 DIAGNOSIS — R278 Other lack of coordination: Secondary | ICD-10-CM | POA: Diagnosis not present

## 2017-11-16 DIAGNOSIS — R2689 Other abnormalities of gait and mobility: Secondary | ICD-10-CM | POA: Diagnosis not present

## 2017-11-16 DIAGNOSIS — R296 Repeated falls: Secondary | ICD-10-CM | POA: Diagnosis not present

## 2017-11-17 DIAGNOSIS — Z9181 History of falling: Secondary | ICD-10-CM | POA: Diagnosis not present

## 2017-11-17 DIAGNOSIS — R2689 Other abnormalities of gait and mobility: Secondary | ICD-10-CM | POA: Diagnosis not present

## 2017-11-17 DIAGNOSIS — M6281 Muscle weakness (generalized): Secondary | ICD-10-CM | POA: Diagnosis not present

## 2017-11-17 DIAGNOSIS — R278 Other lack of coordination: Secondary | ICD-10-CM | POA: Diagnosis not present

## 2017-11-17 DIAGNOSIS — M25511 Pain in right shoulder: Secondary | ICD-10-CM | POA: Diagnosis not present

## 2017-11-17 DIAGNOSIS — R296 Repeated falls: Secondary | ICD-10-CM | POA: Diagnosis not present

## 2017-11-17 DIAGNOSIS — J449 Chronic obstructive pulmonary disease, unspecified: Secondary | ICD-10-CM | POA: Diagnosis not present

## 2017-11-18 DIAGNOSIS — Z9181 History of falling: Secondary | ICD-10-CM | POA: Diagnosis not present

## 2017-11-18 DIAGNOSIS — M6281 Muscle weakness (generalized): Secondary | ICD-10-CM | POA: Diagnosis not present

## 2017-11-18 DIAGNOSIS — R2689 Other abnormalities of gait and mobility: Secondary | ICD-10-CM | POA: Diagnosis not present

## 2017-11-18 DIAGNOSIS — R278 Other lack of coordination: Secondary | ICD-10-CM | POA: Diagnosis not present

## 2017-11-18 DIAGNOSIS — J449 Chronic obstructive pulmonary disease, unspecified: Secondary | ICD-10-CM | POA: Diagnosis not present

## 2017-11-18 DIAGNOSIS — R296 Repeated falls: Secondary | ICD-10-CM | POA: Diagnosis not present

## 2017-11-19 DIAGNOSIS — Z9181 History of falling: Secondary | ICD-10-CM | POA: Diagnosis not present

## 2017-11-19 DIAGNOSIS — R278 Other lack of coordination: Secondary | ICD-10-CM | POA: Diagnosis not present

## 2017-11-19 DIAGNOSIS — R2689 Other abnormalities of gait and mobility: Secondary | ICD-10-CM | POA: Diagnosis not present

## 2017-11-19 DIAGNOSIS — M6281 Muscle weakness (generalized): Secondary | ICD-10-CM | POA: Diagnosis not present

## 2017-11-19 DIAGNOSIS — R296 Repeated falls: Secondary | ICD-10-CM | POA: Diagnosis not present

## 2017-11-19 DIAGNOSIS — J449 Chronic obstructive pulmonary disease, unspecified: Secondary | ICD-10-CM | POA: Diagnosis not present

## 2017-11-21 DIAGNOSIS — Z9181 History of falling: Secondary | ICD-10-CM | POA: Diagnosis not present

## 2017-11-21 DIAGNOSIS — R2689 Other abnormalities of gait and mobility: Secondary | ICD-10-CM | POA: Diagnosis not present

## 2017-11-21 DIAGNOSIS — J449 Chronic obstructive pulmonary disease, unspecified: Secondary | ICD-10-CM | POA: Diagnosis not present

## 2017-11-21 DIAGNOSIS — R278 Other lack of coordination: Secondary | ICD-10-CM | POA: Diagnosis not present

## 2017-11-21 DIAGNOSIS — M6281 Muscle weakness (generalized): Secondary | ICD-10-CM | POA: Diagnosis not present

## 2017-11-21 DIAGNOSIS — R296 Repeated falls: Secondary | ICD-10-CM | POA: Diagnosis not present

## 2017-11-22 DIAGNOSIS — J449 Chronic obstructive pulmonary disease, unspecified: Secondary | ICD-10-CM | POA: Diagnosis not present

## 2017-11-22 DIAGNOSIS — R296 Repeated falls: Secondary | ICD-10-CM | POA: Diagnosis not present

## 2017-11-22 DIAGNOSIS — Z9181 History of falling: Secondary | ICD-10-CM | POA: Diagnosis not present

## 2017-11-22 DIAGNOSIS — R2689 Other abnormalities of gait and mobility: Secondary | ICD-10-CM | POA: Diagnosis not present

## 2017-11-22 DIAGNOSIS — M6281 Muscle weakness (generalized): Secondary | ICD-10-CM | POA: Diagnosis not present

## 2017-11-22 DIAGNOSIS — R278 Other lack of coordination: Secondary | ICD-10-CM | POA: Diagnosis not present

## 2017-11-24 DIAGNOSIS — R296 Repeated falls: Secondary | ICD-10-CM | POA: Diagnosis not present

## 2017-11-24 DIAGNOSIS — M6281 Muscle weakness (generalized): Secondary | ICD-10-CM | POA: Diagnosis not present

## 2017-11-24 DIAGNOSIS — R278 Other lack of coordination: Secondary | ICD-10-CM | POA: Diagnosis not present

## 2017-11-24 DIAGNOSIS — J449 Chronic obstructive pulmonary disease, unspecified: Secondary | ICD-10-CM | POA: Diagnosis not present

## 2017-11-24 DIAGNOSIS — R2689 Other abnormalities of gait and mobility: Secondary | ICD-10-CM | POA: Diagnosis not present

## 2017-11-24 DIAGNOSIS — B379 Candidiasis, unspecified: Secondary | ICD-10-CM | POA: Diagnosis not present

## 2017-11-24 DIAGNOSIS — Z9181 History of falling: Secondary | ICD-10-CM | POA: Diagnosis not present

## 2017-11-25 DIAGNOSIS — R296 Repeated falls: Secondary | ICD-10-CM | POA: Diagnosis not present

## 2017-11-25 DIAGNOSIS — J449 Chronic obstructive pulmonary disease, unspecified: Secondary | ICD-10-CM | POA: Diagnosis not present

## 2017-11-25 DIAGNOSIS — R2689 Other abnormalities of gait and mobility: Secondary | ICD-10-CM | POA: Diagnosis not present

## 2017-11-25 DIAGNOSIS — R278 Other lack of coordination: Secondary | ICD-10-CM | POA: Diagnosis not present

## 2017-11-25 DIAGNOSIS — Z9181 History of falling: Secondary | ICD-10-CM | POA: Diagnosis not present

## 2017-11-25 DIAGNOSIS — M6281 Muscle weakness (generalized): Secondary | ICD-10-CM | POA: Diagnosis not present

## 2017-11-26 DIAGNOSIS — R2689 Other abnormalities of gait and mobility: Secondary | ICD-10-CM | POA: Diagnosis not present

## 2017-11-26 DIAGNOSIS — M6281 Muscle weakness (generalized): Secondary | ICD-10-CM | POA: Diagnosis not present

## 2017-11-26 DIAGNOSIS — R278 Other lack of coordination: Secondary | ICD-10-CM | POA: Diagnosis not present

## 2017-11-26 DIAGNOSIS — R296 Repeated falls: Secondary | ICD-10-CM | POA: Diagnosis not present

## 2017-11-26 DIAGNOSIS — J449 Chronic obstructive pulmonary disease, unspecified: Secondary | ICD-10-CM | POA: Diagnosis not present

## 2017-11-26 DIAGNOSIS — Z9181 History of falling: Secondary | ICD-10-CM | POA: Diagnosis not present

## 2017-11-29 DIAGNOSIS — R278 Other lack of coordination: Secondary | ICD-10-CM | POA: Diagnosis not present

## 2017-11-29 DIAGNOSIS — R2689 Other abnormalities of gait and mobility: Secondary | ICD-10-CM | POA: Diagnosis not present

## 2017-11-29 DIAGNOSIS — M6281 Muscle weakness (generalized): Secondary | ICD-10-CM | POA: Diagnosis not present

## 2017-11-29 DIAGNOSIS — Z9181 History of falling: Secondary | ICD-10-CM | POA: Diagnosis not present

## 2017-11-29 DIAGNOSIS — R296 Repeated falls: Secondary | ICD-10-CM | POA: Diagnosis not present

## 2017-11-29 DIAGNOSIS — J449 Chronic obstructive pulmonary disease, unspecified: Secondary | ICD-10-CM | POA: Diagnosis not present

## 2017-11-30 DIAGNOSIS — I10 Essential (primary) hypertension: Secondary | ICD-10-CM | POA: Diagnosis not present

## 2017-11-30 DIAGNOSIS — R2689 Other abnormalities of gait and mobility: Secondary | ICD-10-CM | POA: Diagnosis not present

## 2017-11-30 DIAGNOSIS — R278 Other lack of coordination: Secondary | ICD-10-CM | POA: Diagnosis not present

## 2017-11-30 DIAGNOSIS — H04123 Dry eye syndrome of bilateral lacrimal glands: Secondary | ICD-10-CM | POA: Diagnosis not present

## 2017-11-30 DIAGNOSIS — J449 Chronic obstructive pulmonary disease, unspecified: Secondary | ICD-10-CM | POA: Diagnosis not present

## 2017-11-30 DIAGNOSIS — Z961 Presence of intraocular lens: Secondary | ICD-10-CM | POA: Diagnosis not present

## 2017-11-30 DIAGNOSIS — Z9181 History of falling: Secondary | ICD-10-CM | POA: Diagnosis not present

## 2017-11-30 DIAGNOSIS — H43813 Vitreous degeneration, bilateral: Secondary | ICD-10-CM | POA: Diagnosis not present

## 2017-11-30 DIAGNOSIS — M6281 Muscle weakness (generalized): Secondary | ICD-10-CM | POA: Diagnosis not present

## 2017-11-30 DIAGNOSIS — R296 Repeated falls: Secondary | ICD-10-CM | POA: Diagnosis not present

## 2017-12-01 DIAGNOSIS — M6281 Muscle weakness (generalized): Secondary | ICD-10-CM | POA: Diagnosis not present

## 2017-12-01 DIAGNOSIS — R296 Repeated falls: Secondary | ICD-10-CM | POA: Diagnosis not present

## 2017-12-01 DIAGNOSIS — Z9181 History of falling: Secondary | ICD-10-CM | POA: Diagnosis not present

## 2017-12-01 DIAGNOSIS — R2689 Other abnormalities of gait and mobility: Secondary | ICD-10-CM | POA: Diagnosis not present

## 2017-12-01 DIAGNOSIS — R278 Other lack of coordination: Secondary | ICD-10-CM | POA: Diagnosis not present

## 2017-12-01 DIAGNOSIS — J449 Chronic obstructive pulmonary disease, unspecified: Secondary | ICD-10-CM | POA: Diagnosis not present

## 2017-12-02 DIAGNOSIS — R296 Repeated falls: Secondary | ICD-10-CM | POA: Diagnosis not present

## 2017-12-02 DIAGNOSIS — R278 Other lack of coordination: Secondary | ICD-10-CM | POA: Diagnosis not present

## 2017-12-02 DIAGNOSIS — R2689 Other abnormalities of gait and mobility: Secondary | ICD-10-CM | POA: Diagnosis not present

## 2017-12-02 DIAGNOSIS — J449 Chronic obstructive pulmonary disease, unspecified: Secondary | ICD-10-CM | POA: Diagnosis not present

## 2017-12-02 DIAGNOSIS — M6281 Muscle weakness (generalized): Secondary | ICD-10-CM | POA: Diagnosis not present

## 2017-12-02 DIAGNOSIS — Z9181 History of falling: Secondary | ICD-10-CM | POA: Diagnosis not present

## 2017-12-03 DIAGNOSIS — J449 Chronic obstructive pulmonary disease, unspecified: Secondary | ICD-10-CM | POA: Diagnosis not present

## 2017-12-03 DIAGNOSIS — R278 Other lack of coordination: Secondary | ICD-10-CM | POA: Diagnosis not present

## 2017-12-03 DIAGNOSIS — R2689 Other abnormalities of gait and mobility: Secondary | ICD-10-CM | POA: Diagnosis not present

## 2017-12-03 DIAGNOSIS — Z9181 History of falling: Secondary | ICD-10-CM | POA: Diagnosis not present

## 2017-12-03 DIAGNOSIS — M6281 Muscle weakness (generalized): Secondary | ICD-10-CM | POA: Diagnosis not present

## 2017-12-03 DIAGNOSIS — R296 Repeated falls: Secondary | ICD-10-CM | POA: Diagnosis not present

## 2017-12-06 DIAGNOSIS — F339 Major depressive disorder, recurrent, unspecified: Secondary | ICD-10-CM | POA: Diagnosis not present

## 2017-12-06 DIAGNOSIS — M6281 Muscle weakness (generalized): Secondary | ICD-10-CM | POA: Diagnosis not present

## 2017-12-06 DIAGNOSIS — Z9181 History of falling: Secondary | ICD-10-CM | POA: Diagnosis not present

## 2017-12-06 DIAGNOSIS — J449 Chronic obstructive pulmonary disease, unspecified: Secondary | ICD-10-CM | POA: Diagnosis not present

## 2017-12-06 DIAGNOSIS — R2689 Other abnormalities of gait and mobility: Secondary | ICD-10-CM | POA: Diagnosis not present

## 2017-12-06 DIAGNOSIS — I10 Essential (primary) hypertension: Secondary | ICD-10-CM | POA: Diagnosis not present

## 2017-12-06 DIAGNOSIS — F0390 Unspecified dementia without behavioral disturbance: Secondary | ICD-10-CM | POA: Diagnosis not present

## 2017-12-06 DIAGNOSIS — R278 Other lack of coordination: Secondary | ICD-10-CM | POA: Diagnosis not present

## 2017-12-06 DIAGNOSIS — R296 Repeated falls: Secondary | ICD-10-CM | POA: Diagnosis not present

## 2017-12-07 DIAGNOSIS — Z9181 History of falling: Secondary | ICD-10-CM | POA: Diagnosis not present

## 2017-12-07 DIAGNOSIS — J449 Chronic obstructive pulmonary disease, unspecified: Secondary | ICD-10-CM | POA: Diagnosis not present

## 2017-12-07 DIAGNOSIS — R2689 Other abnormalities of gait and mobility: Secondary | ICD-10-CM | POA: Diagnosis not present

## 2017-12-07 DIAGNOSIS — R296 Repeated falls: Secondary | ICD-10-CM | POA: Diagnosis not present

## 2017-12-07 DIAGNOSIS — R278 Other lack of coordination: Secondary | ICD-10-CM | POA: Diagnosis not present

## 2017-12-07 DIAGNOSIS — M6281 Muscle weakness (generalized): Secondary | ICD-10-CM | POA: Diagnosis not present

## 2017-12-08 DIAGNOSIS — M6281 Muscle weakness (generalized): Secondary | ICD-10-CM | POA: Diagnosis not present

## 2017-12-08 DIAGNOSIS — R278 Other lack of coordination: Secondary | ICD-10-CM | POA: Diagnosis not present

## 2017-12-08 DIAGNOSIS — Z9181 History of falling: Secondary | ICD-10-CM | POA: Diagnosis not present

## 2017-12-08 DIAGNOSIS — R296 Repeated falls: Secondary | ICD-10-CM | POA: Diagnosis not present

## 2017-12-08 DIAGNOSIS — R2689 Other abnormalities of gait and mobility: Secondary | ICD-10-CM | POA: Diagnosis not present

## 2017-12-08 DIAGNOSIS — J449 Chronic obstructive pulmonary disease, unspecified: Secondary | ICD-10-CM | POA: Diagnosis not present

## 2017-12-09 DIAGNOSIS — R278 Other lack of coordination: Secondary | ICD-10-CM | POA: Diagnosis not present

## 2017-12-09 DIAGNOSIS — M6281 Muscle weakness (generalized): Secondary | ICD-10-CM | POA: Diagnosis not present

## 2017-12-09 DIAGNOSIS — Z9181 History of falling: Secondary | ICD-10-CM | POA: Diagnosis not present

## 2017-12-09 DIAGNOSIS — R296 Repeated falls: Secondary | ICD-10-CM | POA: Diagnosis not present

## 2017-12-09 DIAGNOSIS — R2689 Other abnormalities of gait and mobility: Secondary | ICD-10-CM | POA: Diagnosis not present

## 2017-12-09 DIAGNOSIS — J449 Chronic obstructive pulmonary disease, unspecified: Secondary | ICD-10-CM | POA: Diagnosis not present

## 2017-12-10 DIAGNOSIS — M6281 Muscle weakness (generalized): Secondary | ICD-10-CM | POA: Diagnosis not present

## 2017-12-10 DIAGNOSIS — I10 Essential (primary) hypertension: Secondary | ICD-10-CM | POA: Diagnosis not present

## 2017-12-10 DIAGNOSIS — B379 Candidiasis, unspecified: Secondary | ICD-10-CM | POA: Diagnosis not present

## 2017-12-10 DIAGNOSIS — R2689 Other abnormalities of gait and mobility: Secondary | ICD-10-CM | POA: Diagnosis not present

## 2017-12-10 DIAGNOSIS — R296 Repeated falls: Secondary | ICD-10-CM | POA: Diagnosis not present

## 2017-12-10 DIAGNOSIS — Z9181 History of falling: Secondary | ICD-10-CM | POA: Diagnosis not present

## 2017-12-10 DIAGNOSIS — R278 Other lack of coordination: Secondary | ICD-10-CM | POA: Diagnosis not present

## 2017-12-10 DIAGNOSIS — M25511 Pain in right shoulder: Secondary | ICD-10-CM | POA: Diagnosis not present

## 2017-12-10 DIAGNOSIS — J449 Chronic obstructive pulmonary disease, unspecified: Secondary | ICD-10-CM | POA: Diagnosis not present

## 2017-12-13 DIAGNOSIS — E559 Vitamin D deficiency, unspecified: Secondary | ICD-10-CM | POA: Diagnosis not present

## 2017-12-13 DIAGNOSIS — R296 Repeated falls: Secondary | ICD-10-CM | POA: Diagnosis not present

## 2017-12-13 DIAGNOSIS — J449 Chronic obstructive pulmonary disease, unspecified: Secondary | ICD-10-CM | POA: Diagnosis not present

## 2017-12-13 DIAGNOSIS — R278 Other lack of coordination: Secondary | ICD-10-CM | POA: Diagnosis not present

## 2017-12-13 DIAGNOSIS — M6281 Muscle weakness (generalized): Secondary | ICD-10-CM | POA: Diagnosis not present

## 2017-12-13 DIAGNOSIS — Z9181 History of falling: Secondary | ICD-10-CM | POA: Diagnosis not present

## 2017-12-13 DIAGNOSIS — R2689 Other abnormalities of gait and mobility: Secondary | ICD-10-CM | POA: Diagnosis not present

## 2017-12-14 DIAGNOSIS — R278 Other lack of coordination: Secondary | ICD-10-CM | POA: Diagnosis not present

## 2017-12-14 DIAGNOSIS — J449 Chronic obstructive pulmonary disease, unspecified: Secondary | ICD-10-CM | POA: Diagnosis not present

## 2017-12-14 DIAGNOSIS — R2689 Other abnormalities of gait and mobility: Secondary | ICD-10-CM | POA: Diagnosis not present

## 2017-12-14 DIAGNOSIS — M6281 Muscle weakness (generalized): Secondary | ICD-10-CM | POA: Diagnosis not present

## 2017-12-14 DIAGNOSIS — R296 Repeated falls: Secondary | ICD-10-CM | POA: Diagnosis not present

## 2017-12-14 DIAGNOSIS — Z9181 History of falling: Secondary | ICD-10-CM | POA: Diagnosis not present

## 2017-12-15 DIAGNOSIS — Z9181 History of falling: Secondary | ICD-10-CM | POA: Diagnosis not present

## 2017-12-15 DIAGNOSIS — R2689 Other abnormalities of gait and mobility: Secondary | ICD-10-CM | POA: Diagnosis not present

## 2017-12-15 DIAGNOSIS — R278 Other lack of coordination: Secondary | ICD-10-CM | POA: Diagnosis not present

## 2017-12-15 DIAGNOSIS — J449 Chronic obstructive pulmonary disease, unspecified: Secondary | ICD-10-CM | POA: Diagnosis not present

## 2017-12-15 DIAGNOSIS — R296 Repeated falls: Secondary | ICD-10-CM | POA: Diagnosis not present

## 2017-12-15 DIAGNOSIS — Z23 Encounter for immunization: Secondary | ICD-10-CM | POA: Diagnosis not present

## 2017-12-15 DIAGNOSIS — M6281 Muscle weakness (generalized): Secondary | ICD-10-CM | POA: Diagnosis not present

## 2017-12-16 DIAGNOSIS — R2689 Other abnormalities of gait and mobility: Secondary | ICD-10-CM | POA: Diagnosis not present

## 2017-12-16 DIAGNOSIS — R278 Other lack of coordination: Secondary | ICD-10-CM | POA: Diagnosis not present

## 2017-12-16 DIAGNOSIS — M6281 Muscle weakness (generalized): Secondary | ICD-10-CM | POA: Diagnosis not present

## 2017-12-16 DIAGNOSIS — R296 Repeated falls: Secondary | ICD-10-CM | POA: Diagnosis not present

## 2017-12-16 DIAGNOSIS — Z9181 History of falling: Secondary | ICD-10-CM | POA: Diagnosis not present

## 2017-12-16 DIAGNOSIS — J449 Chronic obstructive pulmonary disease, unspecified: Secondary | ICD-10-CM | POA: Diagnosis not present

## 2017-12-17 DIAGNOSIS — R296 Repeated falls: Secondary | ICD-10-CM | POA: Diagnosis not present

## 2017-12-17 DIAGNOSIS — R278 Other lack of coordination: Secondary | ICD-10-CM | POA: Diagnosis not present

## 2017-12-17 DIAGNOSIS — R2689 Other abnormalities of gait and mobility: Secondary | ICD-10-CM | POA: Diagnosis not present

## 2017-12-17 DIAGNOSIS — Z9181 History of falling: Secondary | ICD-10-CM | POA: Diagnosis not present

## 2017-12-17 DIAGNOSIS — J449 Chronic obstructive pulmonary disease, unspecified: Secondary | ICD-10-CM | POA: Diagnosis not present

## 2017-12-17 DIAGNOSIS — M6281 Muscle weakness (generalized): Secondary | ICD-10-CM | POA: Diagnosis not present

## 2017-12-20 DIAGNOSIS — R278 Other lack of coordination: Secondary | ICD-10-CM | POA: Diagnosis not present

## 2017-12-20 DIAGNOSIS — R296 Repeated falls: Secondary | ICD-10-CM | POA: Diagnosis not present

## 2017-12-20 DIAGNOSIS — J449 Chronic obstructive pulmonary disease, unspecified: Secondary | ICD-10-CM | POA: Diagnosis not present

## 2017-12-20 DIAGNOSIS — M6281 Muscle weakness (generalized): Secondary | ICD-10-CM | POA: Diagnosis not present

## 2017-12-20 DIAGNOSIS — Z9181 History of falling: Secondary | ICD-10-CM | POA: Diagnosis not present

## 2017-12-20 DIAGNOSIS — R2689 Other abnormalities of gait and mobility: Secondary | ICD-10-CM | POA: Diagnosis not present

## 2017-12-21 DIAGNOSIS — J449 Chronic obstructive pulmonary disease, unspecified: Secondary | ICD-10-CM | POA: Diagnosis not present

## 2017-12-21 DIAGNOSIS — R278 Other lack of coordination: Secondary | ICD-10-CM | POA: Diagnosis not present

## 2017-12-21 DIAGNOSIS — R296 Repeated falls: Secondary | ICD-10-CM | POA: Diagnosis not present

## 2017-12-21 DIAGNOSIS — Z9181 History of falling: Secondary | ICD-10-CM | POA: Diagnosis not present

## 2017-12-21 DIAGNOSIS — M6281 Muscle weakness (generalized): Secondary | ICD-10-CM | POA: Diagnosis not present

## 2017-12-21 DIAGNOSIS — R2689 Other abnormalities of gait and mobility: Secondary | ICD-10-CM | POA: Diagnosis not present

## 2017-12-22 DIAGNOSIS — J449 Chronic obstructive pulmonary disease, unspecified: Secondary | ICD-10-CM | POA: Diagnosis not present

## 2017-12-22 DIAGNOSIS — R2689 Other abnormalities of gait and mobility: Secondary | ICD-10-CM | POA: Diagnosis not present

## 2017-12-22 DIAGNOSIS — G4700 Insomnia, unspecified: Secondary | ICD-10-CM | POA: Diagnosis not present

## 2017-12-22 DIAGNOSIS — R296 Repeated falls: Secondary | ICD-10-CM | POA: Diagnosis not present

## 2017-12-22 DIAGNOSIS — M6281 Muscle weakness (generalized): Secondary | ICD-10-CM | POA: Diagnosis not present

## 2017-12-22 DIAGNOSIS — F339 Major depressive disorder, recurrent, unspecified: Secondary | ICD-10-CM | POA: Diagnosis not present

## 2017-12-22 DIAGNOSIS — R278 Other lack of coordination: Secondary | ICD-10-CM | POA: Diagnosis not present

## 2017-12-22 DIAGNOSIS — Z9181 History of falling: Secondary | ICD-10-CM | POA: Diagnosis not present

## 2017-12-23 DIAGNOSIS — Z9181 History of falling: Secondary | ICD-10-CM | POA: Diagnosis not present

## 2017-12-23 DIAGNOSIS — J449 Chronic obstructive pulmonary disease, unspecified: Secondary | ICD-10-CM | POA: Diagnosis not present

## 2017-12-23 DIAGNOSIS — R278 Other lack of coordination: Secondary | ICD-10-CM | POA: Diagnosis not present

## 2017-12-23 DIAGNOSIS — R2689 Other abnormalities of gait and mobility: Secondary | ICD-10-CM | POA: Diagnosis not present

## 2017-12-23 DIAGNOSIS — R296 Repeated falls: Secondary | ICD-10-CM | POA: Diagnosis not present

## 2017-12-23 DIAGNOSIS — M6281 Muscle weakness (generalized): Secondary | ICD-10-CM | POA: Diagnosis not present

## 2017-12-24 DIAGNOSIS — J449 Chronic obstructive pulmonary disease, unspecified: Secondary | ICD-10-CM | POA: Diagnosis not present

## 2017-12-24 DIAGNOSIS — Z9181 History of falling: Secondary | ICD-10-CM | POA: Diagnosis not present

## 2017-12-24 DIAGNOSIS — R296 Repeated falls: Secondary | ICD-10-CM | POA: Diagnosis not present

## 2017-12-24 DIAGNOSIS — M6281 Muscle weakness (generalized): Secondary | ICD-10-CM | POA: Diagnosis not present

## 2017-12-24 DIAGNOSIS — R2689 Other abnormalities of gait and mobility: Secondary | ICD-10-CM | POA: Diagnosis not present

## 2017-12-24 DIAGNOSIS — R278 Other lack of coordination: Secondary | ICD-10-CM | POA: Diagnosis not present

## 2017-12-27 DIAGNOSIS — J449 Chronic obstructive pulmonary disease, unspecified: Secondary | ICD-10-CM | POA: Diagnosis not present

## 2017-12-27 DIAGNOSIS — R278 Other lack of coordination: Secondary | ICD-10-CM | POA: Diagnosis not present

## 2017-12-27 DIAGNOSIS — R296 Repeated falls: Secondary | ICD-10-CM | POA: Diagnosis not present

## 2017-12-27 DIAGNOSIS — M6281 Muscle weakness (generalized): Secondary | ICD-10-CM | POA: Diagnosis not present

## 2017-12-27 DIAGNOSIS — R2689 Other abnormalities of gait and mobility: Secondary | ICD-10-CM | POA: Diagnosis not present

## 2017-12-27 DIAGNOSIS — Z9181 History of falling: Secondary | ICD-10-CM | POA: Diagnosis not present

## 2017-12-28 DIAGNOSIS — Z9181 History of falling: Secondary | ICD-10-CM | POA: Diagnosis not present

## 2017-12-28 DIAGNOSIS — R296 Repeated falls: Secondary | ICD-10-CM | POA: Diagnosis not present

## 2017-12-28 DIAGNOSIS — J449 Chronic obstructive pulmonary disease, unspecified: Secondary | ICD-10-CM | POA: Diagnosis not present

## 2017-12-28 DIAGNOSIS — R278 Other lack of coordination: Secondary | ICD-10-CM | POA: Diagnosis not present

## 2017-12-28 DIAGNOSIS — R2689 Other abnormalities of gait and mobility: Secondary | ICD-10-CM | POA: Diagnosis not present

## 2017-12-28 DIAGNOSIS — M6281 Muscle weakness (generalized): Secondary | ICD-10-CM | POA: Diagnosis not present

## 2017-12-30 DIAGNOSIS — R296 Repeated falls: Secondary | ICD-10-CM | POA: Diagnosis not present

## 2017-12-30 DIAGNOSIS — M6281 Muscle weakness (generalized): Secondary | ICD-10-CM | POA: Diagnosis not present

## 2017-12-30 DIAGNOSIS — J449 Chronic obstructive pulmonary disease, unspecified: Secondary | ICD-10-CM | POA: Diagnosis not present

## 2017-12-30 DIAGNOSIS — R2689 Other abnormalities of gait and mobility: Secondary | ICD-10-CM | POA: Diagnosis not present

## 2017-12-30 DIAGNOSIS — R278 Other lack of coordination: Secondary | ICD-10-CM | POA: Diagnosis not present

## 2017-12-30 DIAGNOSIS — Z9181 History of falling: Secondary | ICD-10-CM | POA: Diagnosis not present

## 2018-01-03 DIAGNOSIS — B351 Tinea unguium: Secondary | ICD-10-CM | POA: Diagnosis not present

## 2018-01-03 DIAGNOSIS — I739 Peripheral vascular disease, unspecified: Secondary | ICD-10-CM | POA: Diagnosis not present

## 2018-01-03 DIAGNOSIS — R262 Difficulty in walking, not elsewhere classified: Secondary | ICD-10-CM | POA: Diagnosis not present

## 2018-01-04 DIAGNOSIS — Z9181 History of falling: Secondary | ICD-10-CM | POA: Diagnosis not present

## 2018-01-04 DIAGNOSIS — R296 Repeated falls: Secondary | ICD-10-CM | POA: Diagnosis not present

## 2018-01-04 DIAGNOSIS — R278 Other lack of coordination: Secondary | ICD-10-CM | POA: Diagnosis not present

## 2018-01-04 DIAGNOSIS — J449 Chronic obstructive pulmonary disease, unspecified: Secondary | ICD-10-CM | POA: Diagnosis not present

## 2018-01-04 DIAGNOSIS — M6281 Muscle weakness (generalized): Secondary | ICD-10-CM | POA: Diagnosis not present

## 2018-01-04 DIAGNOSIS — R2689 Other abnormalities of gait and mobility: Secondary | ICD-10-CM | POA: Diagnosis not present

## 2018-01-05 DIAGNOSIS — R278 Other lack of coordination: Secondary | ICD-10-CM | POA: Diagnosis not present

## 2018-01-05 DIAGNOSIS — R2689 Other abnormalities of gait and mobility: Secondary | ICD-10-CM | POA: Diagnosis not present

## 2018-01-05 DIAGNOSIS — R296 Repeated falls: Secondary | ICD-10-CM | POA: Diagnosis not present

## 2018-01-05 DIAGNOSIS — J449 Chronic obstructive pulmonary disease, unspecified: Secondary | ICD-10-CM | POA: Diagnosis not present

## 2018-01-05 DIAGNOSIS — Z9181 History of falling: Secondary | ICD-10-CM | POA: Diagnosis not present

## 2018-01-05 DIAGNOSIS — M6281 Muscle weakness (generalized): Secondary | ICD-10-CM | POA: Diagnosis not present

## 2018-01-10 DIAGNOSIS — R296 Repeated falls: Secondary | ICD-10-CM | POA: Diagnosis not present

## 2018-01-10 DIAGNOSIS — R278 Other lack of coordination: Secondary | ICD-10-CM | POA: Diagnosis not present

## 2018-01-10 DIAGNOSIS — J449 Chronic obstructive pulmonary disease, unspecified: Secondary | ICD-10-CM | POA: Diagnosis not present

## 2018-01-10 DIAGNOSIS — M6281 Muscle weakness (generalized): Secondary | ICD-10-CM | POA: Diagnosis not present

## 2018-01-10 DIAGNOSIS — R2689 Other abnormalities of gait and mobility: Secondary | ICD-10-CM | POA: Diagnosis not present

## 2018-01-10 DIAGNOSIS — Z9181 History of falling: Secondary | ICD-10-CM | POA: Diagnosis not present

## 2018-01-11 DIAGNOSIS — M6281 Muscle weakness (generalized): Secondary | ICD-10-CM | POA: Diagnosis not present

## 2018-01-11 DIAGNOSIS — R296 Repeated falls: Secondary | ICD-10-CM | POA: Diagnosis not present

## 2018-01-11 DIAGNOSIS — Z9181 History of falling: Secondary | ICD-10-CM | POA: Diagnosis not present

## 2018-01-11 DIAGNOSIS — R278 Other lack of coordination: Secondary | ICD-10-CM | POA: Diagnosis not present

## 2018-01-11 DIAGNOSIS — J449 Chronic obstructive pulmonary disease, unspecified: Secondary | ICD-10-CM | POA: Diagnosis not present

## 2018-01-11 DIAGNOSIS — R2689 Other abnormalities of gait and mobility: Secondary | ICD-10-CM | POA: Diagnosis not present

## 2018-01-12 DIAGNOSIS — M6281 Muscle weakness (generalized): Secondary | ICD-10-CM | POA: Diagnosis not present

## 2018-01-12 DIAGNOSIS — Z9181 History of falling: Secondary | ICD-10-CM | POA: Diagnosis not present

## 2018-01-12 DIAGNOSIS — R2689 Other abnormalities of gait and mobility: Secondary | ICD-10-CM | POA: Diagnosis not present

## 2018-01-12 DIAGNOSIS — R296 Repeated falls: Secondary | ICD-10-CM | POA: Diagnosis not present

## 2018-01-12 DIAGNOSIS — J449 Chronic obstructive pulmonary disease, unspecified: Secondary | ICD-10-CM | POA: Diagnosis not present

## 2018-01-12 DIAGNOSIS — R278 Other lack of coordination: Secondary | ICD-10-CM | POA: Diagnosis not present

## 2018-01-13 DIAGNOSIS — R2689 Other abnormalities of gait and mobility: Secondary | ICD-10-CM | POA: Diagnosis not present

## 2018-01-13 DIAGNOSIS — J449 Chronic obstructive pulmonary disease, unspecified: Secondary | ICD-10-CM | POA: Diagnosis not present

## 2018-01-13 DIAGNOSIS — R278 Other lack of coordination: Secondary | ICD-10-CM | POA: Diagnosis not present

## 2018-01-13 DIAGNOSIS — R296 Repeated falls: Secondary | ICD-10-CM | POA: Diagnosis not present

## 2018-01-13 DIAGNOSIS — M6281 Muscle weakness (generalized): Secondary | ICD-10-CM | POA: Diagnosis not present

## 2018-01-13 DIAGNOSIS — Z9181 History of falling: Secondary | ICD-10-CM | POA: Diagnosis not present

## 2018-01-14 DIAGNOSIS — R278 Other lack of coordination: Secondary | ICD-10-CM | POA: Diagnosis not present

## 2018-01-14 DIAGNOSIS — M6281 Muscle weakness (generalized): Secondary | ICD-10-CM | POA: Diagnosis not present

## 2018-01-14 DIAGNOSIS — J449 Chronic obstructive pulmonary disease, unspecified: Secondary | ICD-10-CM | POA: Diagnosis not present

## 2018-01-14 DIAGNOSIS — Z9181 History of falling: Secondary | ICD-10-CM | POA: Diagnosis not present

## 2018-01-14 DIAGNOSIS — R296 Repeated falls: Secondary | ICD-10-CM | POA: Diagnosis not present

## 2018-01-14 DIAGNOSIS — R2689 Other abnormalities of gait and mobility: Secondary | ICD-10-CM | POA: Diagnosis not present

## 2018-01-18 DIAGNOSIS — R296 Repeated falls: Secondary | ICD-10-CM | POA: Diagnosis not present

## 2018-01-18 DIAGNOSIS — M6281 Muscle weakness (generalized): Secondary | ICD-10-CM | POA: Diagnosis not present

## 2018-01-18 DIAGNOSIS — J449 Chronic obstructive pulmonary disease, unspecified: Secondary | ICD-10-CM | POA: Diagnosis not present

## 2018-01-18 DIAGNOSIS — R2689 Other abnormalities of gait and mobility: Secondary | ICD-10-CM | POA: Diagnosis not present

## 2018-01-18 DIAGNOSIS — R278 Other lack of coordination: Secondary | ICD-10-CM | POA: Diagnosis not present

## 2018-01-18 DIAGNOSIS — Z9181 History of falling: Secondary | ICD-10-CM | POA: Diagnosis not present

## 2018-01-24 DIAGNOSIS — R2689 Other abnormalities of gait and mobility: Secondary | ICD-10-CM | POA: Diagnosis not present

## 2018-01-24 DIAGNOSIS — M6281 Muscle weakness (generalized): Secondary | ICD-10-CM | POA: Diagnosis not present

## 2018-01-24 DIAGNOSIS — J449 Chronic obstructive pulmonary disease, unspecified: Secondary | ICD-10-CM | POA: Diagnosis not present

## 2018-01-24 DIAGNOSIS — R296 Repeated falls: Secondary | ICD-10-CM | POA: Diagnosis not present

## 2018-01-24 DIAGNOSIS — Z9181 History of falling: Secondary | ICD-10-CM | POA: Diagnosis not present

## 2018-01-24 DIAGNOSIS — R278 Other lack of coordination: Secondary | ICD-10-CM | POA: Diagnosis not present

## 2018-01-25 DIAGNOSIS — Z9181 History of falling: Secondary | ICD-10-CM | POA: Diagnosis not present

## 2018-01-25 DIAGNOSIS — R296 Repeated falls: Secondary | ICD-10-CM | POA: Diagnosis not present

## 2018-01-25 DIAGNOSIS — R2689 Other abnormalities of gait and mobility: Secondary | ICD-10-CM | POA: Diagnosis not present

## 2018-01-25 DIAGNOSIS — R278 Other lack of coordination: Secondary | ICD-10-CM | POA: Diagnosis not present

## 2018-01-25 DIAGNOSIS — J449 Chronic obstructive pulmonary disease, unspecified: Secondary | ICD-10-CM | POA: Diagnosis not present

## 2018-01-25 DIAGNOSIS — M6281 Muscle weakness (generalized): Secondary | ICD-10-CM | POA: Diagnosis not present

## 2018-01-26 DIAGNOSIS — G4700 Insomnia, unspecified: Secondary | ICD-10-CM | POA: Diagnosis not present

## 2018-01-26 DIAGNOSIS — R2689 Other abnormalities of gait and mobility: Secondary | ICD-10-CM | POA: Diagnosis not present

## 2018-01-26 DIAGNOSIS — M6281 Muscle weakness (generalized): Secondary | ICD-10-CM | POA: Diagnosis not present

## 2018-01-26 DIAGNOSIS — F339 Major depressive disorder, recurrent, unspecified: Secondary | ICD-10-CM | POA: Diagnosis not present

## 2018-01-26 DIAGNOSIS — J449 Chronic obstructive pulmonary disease, unspecified: Secondary | ICD-10-CM | POA: Diagnosis not present

## 2018-01-26 DIAGNOSIS — R296 Repeated falls: Secondary | ICD-10-CM | POA: Diagnosis not present

## 2018-01-26 DIAGNOSIS — Z9181 History of falling: Secondary | ICD-10-CM | POA: Diagnosis not present

## 2018-01-26 DIAGNOSIS — R278 Other lack of coordination: Secondary | ICD-10-CM | POA: Diagnosis not present

## 2018-01-27 DIAGNOSIS — R278 Other lack of coordination: Secondary | ICD-10-CM | POA: Diagnosis not present

## 2018-01-27 DIAGNOSIS — R296 Repeated falls: Secondary | ICD-10-CM | POA: Diagnosis not present

## 2018-01-27 DIAGNOSIS — J449 Chronic obstructive pulmonary disease, unspecified: Secondary | ICD-10-CM | POA: Diagnosis not present

## 2018-01-27 DIAGNOSIS — R2689 Other abnormalities of gait and mobility: Secondary | ICD-10-CM | POA: Diagnosis not present

## 2018-01-27 DIAGNOSIS — M6281 Muscle weakness (generalized): Secondary | ICD-10-CM | POA: Diagnosis not present

## 2018-01-27 DIAGNOSIS — Z9181 History of falling: Secondary | ICD-10-CM | POA: Diagnosis not present

## 2018-02-01 DIAGNOSIS — R278 Other lack of coordination: Secondary | ICD-10-CM | POA: Diagnosis not present

## 2018-02-01 DIAGNOSIS — R2689 Other abnormalities of gait and mobility: Secondary | ICD-10-CM | POA: Diagnosis not present

## 2018-02-01 DIAGNOSIS — J449 Chronic obstructive pulmonary disease, unspecified: Secondary | ICD-10-CM | POA: Diagnosis not present

## 2018-02-01 DIAGNOSIS — Z9181 History of falling: Secondary | ICD-10-CM | POA: Diagnosis not present

## 2018-02-01 DIAGNOSIS — M6281 Muscle weakness (generalized): Secondary | ICD-10-CM | POA: Diagnosis not present

## 2018-02-01 DIAGNOSIS — R296 Repeated falls: Secondary | ICD-10-CM | POA: Diagnosis not present

## 2018-02-02 DIAGNOSIS — R278 Other lack of coordination: Secondary | ICD-10-CM | POA: Diagnosis not present

## 2018-02-02 DIAGNOSIS — Z9181 History of falling: Secondary | ICD-10-CM | POA: Diagnosis not present

## 2018-02-02 DIAGNOSIS — R296 Repeated falls: Secondary | ICD-10-CM | POA: Diagnosis not present

## 2018-02-02 DIAGNOSIS — J449 Chronic obstructive pulmonary disease, unspecified: Secondary | ICD-10-CM | POA: Diagnosis not present

## 2018-02-02 DIAGNOSIS — M6281 Muscle weakness (generalized): Secondary | ICD-10-CM | POA: Diagnosis not present

## 2018-02-02 DIAGNOSIS — R2689 Other abnormalities of gait and mobility: Secondary | ICD-10-CM | POA: Diagnosis not present

## 2018-02-03 DIAGNOSIS — M6281 Muscle weakness (generalized): Secondary | ICD-10-CM | POA: Diagnosis not present

## 2018-02-03 DIAGNOSIS — R296 Repeated falls: Secondary | ICD-10-CM | POA: Diagnosis not present

## 2018-02-03 DIAGNOSIS — R278 Other lack of coordination: Secondary | ICD-10-CM | POA: Diagnosis not present

## 2018-02-03 DIAGNOSIS — J449 Chronic obstructive pulmonary disease, unspecified: Secondary | ICD-10-CM | POA: Diagnosis not present

## 2018-02-03 DIAGNOSIS — Z9181 History of falling: Secondary | ICD-10-CM | POA: Diagnosis not present

## 2018-02-03 DIAGNOSIS — R2689 Other abnormalities of gait and mobility: Secondary | ICD-10-CM | POA: Diagnosis not present

## 2018-02-07 DIAGNOSIS — R296 Repeated falls: Secondary | ICD-10-CM | POA: Diagnosis not present

## 2018-02-07 DIAGNOSIS — K219 Gastro-esophageal reflux disease without esophagitis: Secondary | ICD-10-CM | POA: Diagnosis not present

## 2018-02-07 DIAGNOSIS — Z9181 History of falling: Secondary | ICD-10-CM | POA: Diagnosis not present

## 2018-02-07 DIAGNOSIS — J449 Chronic obstructive pulmonary disease, unspecified: Secondary | ICD-10-CM | POA: Diagnosis not present

## 2018-02-07 DIAGNOSIS — M6281 Muscle weakness (generalized): Secondary | ICD-10-CM | POA: Diagnosis not present

## 2018-02-07 DIAGNOSIS — R2689 Other abnormalities of gait and mobility: Secondary | ICD-10-CM | POA: Diagnosis not present

## 2018-02-07 DIAGNOSIS — F0390 Unspecified dementia without behavioral disturbance: Secondary | ICD-10-CM | POA: Diagnosis not present

## 2018-02-07 DIAGNOSIS — R278 Other lack of coordination: Secondary | ICD-10-CM | POA: Diagnosis not present

## 2018-02-07 DIAGNOSIS — I10 Essential (primary) hypertension: Secondary | ICD-10-CM | POA: Diagnosis not present

## 2018-02-08 DIAGNOSIS — H1013 Acute atopic conjunctivitis, bilateral: Secondary | ICD-10-CM | POA: Diagnosis not present

## 2018-02-09 DIAGNOSIS — R2689 Other abnormalities of gait and mobility: Secondary | ICD-10-CM | POA: Diagnosis not present

## 2018-02-09 DIAGNOSIS — Z9181 History of falling: Secondary | ICD-10-CM | POA: Diagnosis not present

## 2018-02-09 DIAGNOSIS — R278 Other lack of coordination: Secondary | ICD-10-CM | POA: Diagnosis not present

## 2018-02-09 DIAGNOSIS — R296 Repeated falls: Secondary | ICD-10-CM | POA: Diagnosis not present

## 2018-02-09 DIAGNOSIS — M6281 Muscle weakness (generalized): Secondary | ICD-10-CM | POA: Diagnosis not present

## 2018-02-09 DIAGNOSIS — J449 Chronic obstructive pulmonary disease, unspecified: Secondary | ICD-10-CM | POA: Diagnosis not present

## 2018-02-10 DIAGNOSIS — R278 Other lack of coordination: Secondary | ICD-10-CM | POA: Diagnosis not present

## 2018-02-10 DIAGNOSIS — Z9181 History of falling: Secondary | ICD-10-CM | POA: Diagnosis not present

## 2018-02-10 DIAGNOSIS — R296 Repeated falls: Secondary | ICD-10-CM | POA: Diagnosis not present

## 2018-02-10 DIAGNOSIS — J449 Chronic obstructive pulmonary disease, unspecified: Secondary | ICD-10-CM | POA: Diagnosis not present

## 2018-02-10 DIAGNOSIS — R2689 Other abnormalities of gait and mobility: Secondary | ICD-10-CM | POA: Diagnosis not present

## 2018-02-10 DIAGNOSIS — M6281 Muscle weakness (generalized): Secondary | ICD-10-CM | POA: Diagnosis not present

## 2018-02-11 DIAGNOSIS — R278 Other lack of coordination: Secondary | ICD-10-CM | POA: Diagnosis not present

## 2018-02-11 DIAGNOSIS — R296 Repeated falls: Secondary | ICD-10-CM | POA: Diagnosis not present

## 2018-02-11 DIAGNOSIS — R2689 Other abnormalities of gait and mobility: Secondary | ICD-10-CM | POA: Diagnosis not present

## 2018-02-11 DIAGNOSIS — M6281 Muscle weakness (generalized): Secondary | ICD-10-CM | POA: Diagnosis not present

## 2018-02-11 DIAGNOSIS — J449 Chronic obstructive pulmonary disease, unspecified: Secondary | ICD-10-CM | POA: Diagnosis not present

## 2018-02-11 DIAGNOSIS — Z9181 History of falling: Secondary | ICD-10-CM | POA: Diagnosis not present

## 2018-02-13 DIAGNOSIS — R278 Other lack of coordination: Secondary | ICD-10-CM | POA: Diagnosis not present

## 2018-02-13 DIAGNOSIS — R296 Repeated falls: Secondary | ICD-10-CM | POA: Diagnosis not present

## 2018-02-13 DIAGNOSIS — M6281 Muscle weakness (generalized): Secondary | ICD-10-CM | POA: Diagnosis not present

## 2018-02-13 DIAGNOSIS — J449 Chronic obstructive pulmonary disease, unspecified: Secondary | ICD-10-CM | POA: Diagnosis not present

## 2018-02-13 DIAGNOSIS — Z9181 History of falling: Secondary | ICD-10-CM | POA: Diagnosis not present

## 2018-02-13 DIAGNOSIS — R2689 Other abnormalities of gait and mobility: Secondary | ICD-10-CM | POA: Diagnosis not present

## 2018-02-17 DIAGNOSIS — M25561 Pain in right knee: Secondary | ICD-10-CM | POA: Diagnosis not present

## 2018-02-17 DIAGNOSIS — R278 Other lack of coordination: Secondary | ICD-10-CM | POA: Diagnosis not present

## 2018-02-17 DIAGNOSIS — R2689 Other abnormalities of gait and mobility: Secondary | ICD-10-CM | POA: Diagnosis not present

## 2018-02-17 DIAGNOSIS — J449 Chronic obstructive pulmonary disease, unspecified: Secondary | ICD-10-CM | POA: Diagnosis not present

## 2018-02-17 DIAGNOSIS — M6281 Muscle weakness (generalized): Secondary | ICD-10-CM | POA: Diagnosis not present

## 2018-02-17 DIAGNOSIS — K219 Gastro-esophageal reflux disease without esophagitis: Secondary | ICD-10-CM | POA: Diagnosis not present

## 2018-02-17 DIAGNOSIS — R296 Repeated falls: Secondary | ICD-10-CM | POA: Diagnosis not present

## 2018-02-17 DIAGNOSIS — Z9181 History of falling: Secondary | ICD-10-CM | POA: Diagnosis not present

## 2018-02-17 DIAGNOSIS — M25511 Pain in right shoulder: Secondary | ICD-10-CM | POA: Diagnosis not present

## 2018-02-22 DIAGNOSIS — R2689 Other abnormalities of gait and mobility: Secondary | ICD-10-CM | POA: Diagnosis not present

## 2018-02-22 DIAGNOSIS — Z9181 History of falling: Secondary | ICD-10-CM | POA: Diagnosis not present

## 2018-02-22 DIAGNOSIS — R278 Other lack of coordination: Secondary | ICD-10-CM | POA: Diagnosis not present

## 2018-02-22 DIAGNOSIS — R296 Repeated falls: Secondary | ICD-10-CM | POA: Diagnosis not present

## 2018-02-22 DIAGNOSIS — M6281 Muscle weakness (generalized): Secondary | ICD-10-CM | POA: Diagnosis not present

## 2018-02-22 DIAGNOSIS — J449 Chronic obstructive pulmonary disease, unspecified: Secondary | ICD-10-CM | POA: Diagnosis not present

## 2018-02-23 DIAGNOSIS — R2689 Other abnormalities of gait and mobility: Secondary | ICD-10-CM | POA: Diagnosis not present

## 2018-02-23 DIAGNOSIS — M6281 Muscle weakness (generalized): Secondary | ICD-10-CM | POA: Diagnosis not present

## 2018-02-23 DIAGNOSIS — Z9181 History of falling: Secondary | ICD-10-CM | POA: Diagnosis not present

## 2018-02-23 DIAGNOSIS — J449 Chronic obstructive pulmonary disease, unspecified: Secondary | ICD-10-CM | POA: Diagnosis not present

## 2018-02-23 DIAGNOSIS — R296 Repeated falls: Secondary | ICD-10-CM | POA: Diagnosis not present

## 2018-02-23 DIAGNOSIS — R278 Other lack of coordination: Secondary | ICD-10-CM | POA: Diagnosis not present

## 2018-02-28 DIAGNOSIS — M6281 Muscle weakness (generalized): Secondary | ICD-10-CM | POA: Diagnosis not present

## 2018-02-28 DIAGNOSIS — R278 Other lack of coordination: Secondary | ICD-10-CM | POA: Diagnosis not present

## 2018-02-28 DIAGNOSIS — Z9181 History of falling: Secondary | ICD-10-CM | POA: Diagnosis not present

## 2018-02-28 DIAGNOSIS — J449 Chronic obstructive pulmonary disease, unspecified: Secondary | ICD-10-CM | POA: Diagnosis not present

## 2018-02-28 DIAGNOSIS — R2689 Other abnormalities of gait and mobility: Secondary | ICD-10-CM | POA: Diagnosis not present

## 2018-02-28 DIAGNOSIS — R296 Repeated falls: Secondary | ICD-10-CM | POA: Diagnosis not present

## 2018-03-01 DIAGNOSIS — R296 Repeated falls: Secondary | ICD-10-CM | POA: Diagnosis not present

## 2018-03-01 DIAGNOSIS — R278 Other lack of coordination: Secondary | ICD-10-CM | POA: Diagnosis not present

## 2018-03-01 DIAGNOSIS — J449 Chronic obstructive pulmonary disease, unspecified: Secondary | ICD-10-CM | POA: Diagnosis not present

## 2018-03-01 DIAGNOSIS — M6281 Muscle weakness (generalized): Secondary | ICD-10-CM | POA: Diagnosis not present

## 2018-03-01 DIAGNOSIS — Z9181 History of falling: Secondary | ICD-10-CM | POA: Diagnosis not present

## 2018-03-01 DIAGNOSIS — R2689 Other abnormalities of gait and mobility: Secondary | ICD-10-CM | POA: Diagnosis not present

## 2018-03-02 DIAGNOSIS — R2689 Other abnormalities of gait and mobility: Secondary | ICD-10-CM | POA: Diagnosis not present

## 2018-03-02 DIAGNOSIS — R278 Other lack of coordination: Secondary | ICD-10-CM | POA: Diagnosis not present

## 2018-03-02 DIAGNOSIS — R296 Repeated falls: Secondary | ICD-10-CM | POA: Diagnosis not present

## 2018-03-02 DIAGNOSIS — Z9181 History of falling: Secondary | ICD-10-CM | POA: Diagnosis not present

## 2018-03-02 DIAGNOSIS — J449 Chronic obstructive pulmonary disease, unspecified: Secondary | ICD-10-CM | POA: Diagnosis not present

## 2018-03-02 DIAGNOSIS — M6281 Muscle weakness (generalized): Secondary | ICD-10-CM | POA: Diagnosis not present

## 2018-03-03 DIAGNOSIS — M6281 Muscle weakness (generalized): Secondary | ICD-10-CM | POA: Diagnosis not present

## 2018-03-03 DIAGNOSIS — R296 Repeated falls: Secondary | ICD-10-CM | POA: Diagnosis not present

## 2018-03-03 DIAGNOSIS — R2689 Other abnormalities of gait and mobility: Secondary | ICD-10-CM | POA: Diagnosis not present

## 2018-03-03 DIAGNOSIS — Z9181 History of falling: Secondary | ICD-10-CM | POA: Diagnosis not present

## 2018-03-03 DIAGNOSIS — R278 Other lack of coordination: Secondary | ICD-10-CM | POA: Diagnosis not present

## 2018-03-03 DIAGNOSIS — J449 Chronic obstructive pulmonary disease, unspecified: Secondary | ICD-10-CM | POA: Diagnosis not present

## 2018-03-04 DIAGNOSIS — M6281 Muscle weakness (generalized): Secondary | ICD-10-CM | POA: Diagnosis not present

## 2018-03-04 DIAGNOSIS — R296 Repeated falls: Secondary | ICD-10-CM | POA: Diagnosis not present

## 2018-03-04 DIAGNOSIS — Z9181 History of falling: Secondary | ICD-10-CM | POA: Diagnosis not present

## 2018-03-04 DIAGNOSIS — R2689 Other abnormalities of gait and mobility: Secondary | ICD-10-CM | POA: Diagnosis not present

## 2018-03-04 DIAGNOSIS — J449 Chronic obstructive pulmonary disease, unspecified: Secondary | ICD-10-CM | POA: Diagnosis not present

## 2018-03-04 DIAGNOSIS — R278 Other lack of coordination: Secondary | ICD-10-CM | POA: Diagnosis not present

## 2018-03-07 DIAGNOSIS — Z9181 History of falling: Secondary | ICD-10-CM | POA: Diagnosis not present

## 2018-03-07 DIAGNOSIS — M6281 Muscle weakness (generalized): Secondary | ICD-10-CM | POA: Diagnosis not present

## 2018-03-07 DIAGNOSIS — J449 Chronic obstructive pulmonary disease, unspecified: Secondary | ICD-10-CM | POA: Diagnosis not present

## 2018-03-07 DIAGNOSIS — R296 Repeated falls: Secondary | ICD-10-CM | POA: Diagnosis not present

## 2018-03-07 DIAGNOSIS — R2689 Other abnormalities of gait and mobility: Secondary | ICD-10-CM | POA: Diagnosis not present

## 2018-03-07 DIAGNOSIS — R278 Other lack of coordination: Secondary | ICD-10-CM | POA: Diagnosis not present

## 2018-03-09 ENCOUNTER — Other Ambulatory Visit: Payer: Self-pay | Admitting: Internal Medicine

## 2018-03-10 DIAGNOSIS — Z9181 History of falling: Secondary | ICD-10-CM | POA: Diagnosis not present

## 2018-03-10 DIAGNOSIS — M6281 Muscle weakness (generalized): Secondary | ICD-10-CM | POA: Diagnosis not present

## 2018-03-10 DIAGNOSIS — J449 Chronic obstructive pulmonary disease, unspecified: Secondary | ICD-10-CM | POA: Diagnosis not present

## 2018-03-10 DIAGNOSIS — R296 Repeated falls: Secondary | ICD-10-CM | POA: Diagnosis not present

## 2018-03-10 DIAGNOSIS — R2689 Other abnormalities of gait and mobility: Secondary | ICD-10-CM | POA: Diagnosis not present

## 2018-03-10 DIAGNOSIS — R278 Other lack of coordination: Secondary | ICD-10-CM | POA: Diagnosis not present

## 2018-03-14 ENCOUNTER — Other Ambulatory Visit: Payer: Self-pay | Admitting: Internal Medicine

## 2018-03-14 DIAGNOSIS — I6529 Occlusion and stenosis of unspecified carotid artery: Secondary | ICD-10-CM

## 2018-03-15 DIAGNOSIS — R2689 Other abnormalities of gait and mobility: Secondary | ICD-10-CM | POA: Diagnosis not present

## 2018-03-15 DIAGNOSIS — J449 Chronic obstructive pulmonary disease, unspecified: Secondary | ICD-10-CM | POA: Diagnosis not present

## 2018-03-15 DIAGNOSIS — R278 Other lack of coordination: Secondary | ICD-10-CM | POA: Diagnosis not present

## 2018-03-15 DIAGNOSIS — Z9181 History of falling: Secondary | ICD-10-CM | POA: Diagnosis not present

## 2018-03-15 DIAGNOSIS — R296 Repeated falls: Secondary | ICD-10-CM | POA: Diagnosis not present

## 2018-03-15 DIAGNOSIS — M6281 Muscle weakness (generalized): Secondary | ICD-10-CM | POA: Diagnosis not present

## 2018-03-16 DIAGNOSIS — R296 Repeated falls: Secondary | ICD-10-CM | POA: Diagnosis not present

## 2018-03-16 DIAGNOSIS — M6281 Muscle weakness (generalized): Secondary | ICD-10-CM | POA: Diagnosis not present

## 2018-03-16 DIAGNOSIS — J449 Chronic obstructive pulmonary disease, unspecified: Secondary | ICD-10-CM | POA: Diagnosis not present

## 2018-03-16 DIAGNOSIS — R278 Other lack of coordination: Secondary | ICD-10-CM | POA: Diagnosis not present

## 2018-03-16 DIAGNOSIS — Z9181 History of falling: Secondary | ICD-10-CM | POA: Diagnosis not present

## 2018-03-16 DIAGNOSIS — R2689 Other abnormalities of gait and mobility: Secondary | ICD-10-CM | POA: Diagnosis not present

## 2018-03-17 DIAGNOSIS — R278 Other lack of coordination: Secondary | ICD-10-CM | POA: Diagnosis not present

## 2018-03-17 DIAGNOSIS — Z9181 History of falling: Secondary | ICD-10-CM | POA: Diagnosis not present

## 2018-03-17 DIAGNOSIS — M6281 Muscle weakness (generalized): Secondary | ICD-10-CM | POA: Diagnosis not present

## 2018-03-17 DIAGNOSIS — R296 Repeated falls: Secondary | ICD-10-CM | POA: Diagnosis not present

## 2018-03-17 DIAGNOSIS — R2689 Other abnormalities of gait and mobility: Secondary | ICD-10-CM | POA: Diagnosis not present

## 2018-03-17 DIAGNOSIS — J449 Chronic obstructive pulmonary disease, unspecified: Secondary | ICD-10-CM | POA: Diagnosis not present

## 2018-03-18 ENCOUNTER — Ambulatory Visit
Admission: RE | Admit: 2018-03-18 | Discharge: 2018-03-18 | Disposition: A | Discharge status: Home / Self Care | Payer: Medicare Other | Source: Ambulatory Visit | Attending: Internal Medicine | Admitting: Internal Medicine

## 2018-03-18 DIAGNOSIS — I6523 Occlusion and stenosis of bilateral carotid arteries: Secondary | ICD-10-CM | POA: Diagnosis not present

## 2018-03-18 DIAGNOSIS — R2689 Other abnormalities of gait and mobility: Secondary | ICD-10-CM | POA: Diagnosis not present

## 2018-03-18 DIAGNOSIS — R296 Repeated falls: Secondary | ICD-10-CM | POA: Diagnosis not present

## 2018-03-18 DIAGNOSIS — Z9181 History of falling: Secondary | ICD-10-CM | POA: Diagnosis not present

## 2018-03-18 DIAGNOSIS — J449 Chronic obstructive pulmonary disease, unspecified: Secondary | ICD-10-CM | POA: Diagnosis not present

## 2018-03-18 DIAGNOSIS — I6529 Occlusion and stenosis of unspecified carotid artery: Secondary | ICD-10-CM

## 2018-03-18 DIAGNOSIS — R278 Other lack of coordination: Secondary | ICD-10-CM | POA: Diagnosis not present

## 2018-03-18 DIAGNOSIS — M6281 Muscle weakness (generalized): Secondary | ICD-10-CM | POA: Diagnosis not present

## 2018-03-23 DIAGNOSIS — Z9181 History of falling: Secondary | ICD-10-CM | POA: Diagnosis not present

## 2018-03-23 DIAGNOSIS — M6281 Muscle weakness (generalized): Secondary | ICD-10-CM | POA: Diagnosis not present

## 2018-03-23 DIAGNOSIS — R296 Repeated falls: Secondary | ICD-10-CM | POA: Diagnosis not present

## 2018-03-23 DIAGNOSIS — J449 Chronic obstructive pulmonary disease, unspecified: Secondary | ICD-10-CM | POA: Diagnosis not present

## 2018-03-23 DIAGNOSIS — R2689 Other abnormalities of gait and mobility: Secondary | ICD-10-CM | POA: Diagnosis not present

## 2018-03-23 DIAGNOSIS — R278 Other lack of coordination: Secondary | ICD-10-CM | POA: Diagnosis not present

## 2018-03-24 DIAGNOSIS — Z9181 History of falling: Secondary | ICD-10-CM | POA: Diagnosis not present

## 2018-03-24 DIAGNOSIS — R296 Repeated falls: Secondary | ICD-10-CM | POA: Diagnosis not present

## 2018-03-24 DIAGNOSIS — R2689 Other abnormalities of gait and mobility: Secondary | ICD-10-CM | POA: Diagnosis not present

## 2018-03-24 DIAGNOSIS — M6281 Muscle weakness (generalized): Secondary | ICD-10-CM | POA: Diagnosis not present

## 2018-03-24 DIAGNOSIS — J449 Chronic obstructive pulmonary disease, unspecified: Secondary | ICD-10-CM | POA: Diagnosis not present

## 2018-03-24 DIAGNOSIS — R278 Other lack of coordination: Secondary | ICD-10-CM | POA: Diagnosis not present

## 2018-03-25 DIAGNOSIS — R278 Other lack of coordination: Secondary | ICD-10-CM | POA: Diagnosis not present

## 2018-03-25 DIAGNOSIS — R296 Repeated falls: Secondary | ICD-10-CM | POA: Diagnosis not present

## 2018-03-25 DIAGNOSIS — R2689 Other abnormalities of gait and mobility: Secondary | ICD-10-CM | POA: Diagnosis not present

## 2018-03-25 DIAGNOSIS — M6281 Muscle weakness (generalized): Secondary | ICD-10-CM | POA: Diagnosis not present

## 2018-03-25 DIAGNOSIS — J449 Chronic obstructive pulmonary disease, unspecified: Secondary | ICD-10-CM | POA: Diagnosis not present

## 2018-03-25 DIAGNOSIS — Z9181 History of falling: Secondary | ICD-10-CM | POA: Diagnosis not present

## 2018-03-29 DIAGNOSIS — Z9181 History of falling: Secondary | ICD-10-CM | POA: Diagnosis not present

## 2018-03-29 DIAGNOSIS — R278 Other lack of coordination: Secondary | ICD-10-CM | POA: Diagnosis not present

## 2018-03-29 DIAGNOSIS — R2689 Other abnormalities of gait and mobility: Secondary | ICD-10-CM | POA: Diagnosis not present

## 2018-03-29 DIAGNOSIS — M6281 Muscle weakness (generalized): Secondary | ICD-10-CM | POA: Diagnosis not present

## 2018-03-29 DIAGNOSIS — R296 Repeated falls: Secondary | ICD-10-CM | POA: Diagnosis not present

## 2018-03-29 DIAGNOSIS — J449 Chronic obstructive pulmonary disease, unspecified: Secondary | ICD-10-CM | POA: Diagnosis not present

## 2018-03-31 DIAGNOSIS — R296 Repeated falls: Secondary | ICD-10-CM | POA: Diagnosis not present

## 2018-03-31 DIAGNOSIS — M6281 Muscle weakness (generalized): Secondary | ICD-10-CM | POA: Diagnosis not present

## 2018-03-31 DIAGNOSIS — Z9181 History of falling: Secondary | ICD-10-CM | POA: Diagnosis not present

## 2018-03-31 DIAGNOSIS — J449 Chronic obstructive pulmonary disease, unspecified: Secondary | ICD-10-CM | POA: Diagnosis not present

## 2018-03-31 DIAGNOSIS — R278 Other lack of coordination: Secondary | ICD-10-CM | POA: Diagnosis not present

## 2018-03-31 DIAGNOSIS — R2689 Other abnormalities of gait and mobility: Secondary | ICD-10-CM | POA: Diagnosis not present

## 2018-04-01 DIAGNOSIS — R2689 Other abnormalities of gait and mobility: Secondary | ICD-10-CM | POA: Diagnosis not present

## 2018-04-01 DIAGNOSIS — R278 Other lack of coordination: Secondary | ICD-10-CM | POA: Diagnosis not present

## 2018-04-01 DIAGNOSIS — M6281 Muscle weakness (generalized): Secondary | ICD-10-CM | POA: Diagnosis not present

## 2018-04-01 DIAGNOSIS — J449 Chronic obstructive pulmonary disease, unspecified: Secondary | ICD-10-CM | POA: Diagnosis not present

## 2018-04-01 DIAGNOSIS — Z9181 History of falling: Secondary | ICD-10-CM | POA: Diagnosis not present

## 2018-04-01 DIAGNOSIS — R296 Repeated falls: Secondary | ICD-10-CM | POA: Diagnosis not present

## 2018-04-04 DIAGNOSIS — R296 Repeated falls: Secondary | ICD-10-CM | POA: Diagnosis not present

## 2018-04-04 DIAGNOSIS — J449 Chronic obstructive pulmonary disease, unspecified: Secondary | ICD-10-CM | POA: Diagnosis not present

## 2018-04-04 DIAGNOSIS — M6281 Muscle weakness (generalized): Secondary | ICD-10-CM | POA: Diagnosis not present

## 2018-04-04 DIAGNOSIS — Z9181 History of falling: Secondary | ICD-10-CM | POA: Diagnosis not present

## 2018-04-04 DIAGNOSIS — R2689 Other abnormalities of gait and mobility: Secondary | ICD-10-CM | POA: Diagnosis not present

## 2018-04-04 DIAGNOSIS — R278 Other lack of coordination: Secondary | ICD-10-CM | POA: Diagnosis not present

## 2018-04-05 DIAGNOSIS — J438 Other emphysema: Secondary | ICD-10-CM | POA: Diagnosis not present

## 2018-04-05 DIAGNOSIS — I10 Essential (primary) hypertension: Secondary | ICD-10-CM | POA: Diagnosis not present

## 2018-04-05 DIAGNOSIS — R2689 Other abnormalities of gait and mobility: Secondary | ICD-10-CM | POA: Diagnosis not present

## 2018-04-05 DIAGNOSIS — F39 Unspecified mood [affective] disorder: Secondary | ICD-10-CM | POA: Diagnosis not present

## 2018-04-05 DIAGNOSIS — K219 Gastro-esophageal reflux disease without esophagitis: Secondary | ICD-10-CM | POA: Diagnosis not present

## 2018-04-05 DIAGNOSIS — D500 Iron deficiency anemia secondary to blood loss (chronic): Secondary | ICD-10-CM | POA: Diagnosis not present

## 2018-04-05 DIAGNOSIS — R143 Flatulence: Secondary | ICD-10-CM | POA: Diagnosis not present

## 2018-04-05 DIAGNOSIS — G478 Other sleep disorders: Secondary | ICD-10-CM | POA: Diagnosis not present

## 2018-04-08 DIAGNOSIS — R2689 Other abnormalities of gait and mobility: Secondary | ICD-10-CM | POA: Diagnosis not present

## 2018-04-08 DIAGNOSIS — M6281 Muscle weakness (generalized): Secondary | ICD-10-CM | POA: Diagnosis not present

## 2018-04-08 DIAGNOSIS — Z9181 History of falling: Secondary | ICD-10-CM | POA: Diagnosis not present

## 2018-04-08 DIAGNOSIS — R278 Other lack of coordination: Secondary | ICD-10-CM | POA: Diagnosis not present

## 2018-04-08 DIAGNOSIS — R296 Repeated falls: Secondary | ICD-10-CM | POA: Diagnosis not present

## 2018-04-08 DIAGNOSIS — J449 Chronic obstructive pulmonary disease, unspecified: Secondary | ICD-10-CM | POA: Diagnosis not present

## 2018-04-12 DIAGNOSIS — R2689 Other abnormalities of gait and mobility: Secondary | ICD-10-CM | POA: Diagnosis not present

## 2018-04-12 DIAGNOSIS — J449 Chronic obstructive pulmonary disease, unspecified: Secondary | ICD-10-CM | POA: Diagnosis not present

## 2018-04-12 DIAGNOSIS — R296 Repeated falls: Secondary | ICD-10-CM | POA: Diagnosis not present

## 2018-04-12 DIAGNOSIS — Z9181 History of falling: Secondary | ICD-10-CM | POA: Diagnosis not present

## 2018-04-12 DIAGNOSIS — R278 Other lack of coordination: Secondary | ICD-10-CM | POA: Diagnosis not present

## 2018-04-12 DIAGNOSIS — M6281 Muscle weakness (generalized): Secondary | ICD-10-CM | POA: Diagnosis not present

## 2018-04-13 DIAGNOSIS — R296 Repeated falls: Secondary | ICD-10-CM | POA: Diagnosis not present

## 2018-04-13 DIAGNOSIS — R278 Other lack of coordination: Secondary | ICD-10-CM | POA: Diagnosis not present

## 2018-04-13 DIAGNOSIS — M6281 Muscle weakness (generalized): Secondary | ICD-10-CM | POA: Diagnosis not present

## 2018-04-13 DIAGNOSIS — Z9181 History of falling: Secondary | ICD-10-CM | POA: Diagnosis not present

## 2018-04-13 DIAGNOSIS — R2689 Other abnormalities of gait and mobility: Secondary | ICD-10-CM | POA: Diagnosis not present

## 2018-04-13 DIAGNOSIS — J449 Chronic obstructive pulmonary disease, unspecified: Secondary | ICD-10-CM | POA: Diagnosis not present

## 2018-04-14 DIAGNOSIS — Z9181 History of falling: Secondary | ICD-10-CM | POA: Diagnosis not present

## 2018-04-14 DIAGNOSIS — M6281 Muscle weakness (generalized): Secondary | ICD-10-CM | POA: Diagnosis not present

## 2018-04-14 DIAGNOSIS — R2689 Other abnormalities of gait and mobility: Secondary | ICD-10-CM | POA: Diagnosis not present

## 2018-04-14 DIAGNOSIS — R296 Repeated falls: Secondary | ICD-10-CM | POA: Diagnosis not present

## 2018-04-14 DIAGNOSIS — J449 Chronic obstructive pulmonary disease, unspecified: Secondary | ICD-10-CM | POA: Diagnosis not present

## 2018-04-14 DIAGNOSIS — R278 Other lack of coordination: Secondary | ICD-10-CM | POA: Diagnosis not present

## 2018-04-20 DIAGNOSIS — Z20828 Contact with and (suspected) exposure to other viral communicable diseases: Secondary | ICD-10-CM | POA: Diagnosis not present

## 2018-04-20 DIAGNOSIS — I10 Essential (primary) hypertension: Secondary | ICD-10-CM | POA: Diagnosis not present

## 2018-06-08 DIAGNOSIS — F39 Unspecified mood [affective] disorder: Secondary | ICD-10-CM | POA: Diagnosis not present

## 2018-06-08 DIAGNOSIS — R143 Flatulence: Secondary | ICD-10-CM | POA: Diagnosis not present

## 2018-06-08 DIAGNOSIS — J438 Other emphysema: Secondary | ICD-10-CM | POA: Diagnosis not present

## 2018-06-08 DIAGNOSIS — I10 Essential (primary) hypertension: Secondary | ICD-10-CM | POA: Diagnosis not present

## 2018-06-08 DIAGNOSIS — D500 Iron deficiency anemia secondary to blood loss (chronic): Secondary | ICD-10-CM | POA: Diagnosis not present

## 2018-06-08 DIAGNOSIS — K219 Gastro-esophageal reflux disease without esophagitis: Secondary | ICD-10-CM | POA: Diagnosis not present

## 2018-06-08 DIAGNOSIS — E568 Deficiency of other vitamins: Secondary | ICD-10-CM | POA: Diagnosis not present

## 2018-06-20 DIAGNOSIS — B349 Viral infection, unspecified: Secondary | ICD-10-CM | POA: Diagnosis not present

## 2018-06-23 DIAGNOSIS — E568 Deficiency of other vitamins: Secondary | ICD-10-CM | POA: Diagnosis not present

## 2018-06-23 DIAGNOSIS — I10 Essential (primary) hypertension: Secondary | ICD-10-CM | POA: Diagnosis not present

## 2018-06-23 DIAGNOSIS — J45998 Other asthma: Secondary | ICD-10-CM | POA: Diagnosis not present

## 2018-06-23 DIAGNOSIS — J683 Other acute and subacute respiratory conditions due to chemicals, gases, fumes and vapors: Secondary | ICD-10-CM | POA: Diagnosis not present

## 2018-06-23 DIAGNOSIS — D6489 Other specified anemias: Secondary | ICD-10-CM | POA: Diagnosis not present

## 2018-06-23 DIAGNOSIS — M1389 Other specified arthritis, multiple sites: Secondary | ICD-10-CM | POA: Diagnosis not present

## 2018-07-14 DIAGNOSIS — R278 Other lack of coordination: Secondary | ICD-10-CM | POA: Diagnosis not present

## 2018-07-14 DIAGNOSIS — M6281 Muscle weakness (generalized): Secondary | ICD-10-CM | POA: Diagnosis not present

## 2018-07-14 DIAGNOSIS — Z9181 History of falling: Secondary | ICD-10-CM | POA: Diagnosis not present

## 2018-07-14 DIAGNOSIS — J449 Chronic obstructive pulmonary disease, unspecified: Secondary | ICD-10-CM | POA: Diagnosis not present

## 2018-07-14 DIAGNOSIS — R2689 Other abnormalities of gait and mobility: Secondary | ICD-10-CM | POA: Diagnosis not present

## 2018-07-14 DIAGNOSIS — R296 Repeated falls: Secondary | ICD-10-CM | POA: Diagnosis not present

## 2018-07-15 DIAGNOSIS — Z9181 History of falling: Secondary | ICD-10-CM | POA: Diagnosis not present

## 2018-07-15 DIAGNOSIS — J449 Chronic obstructive pulmonary disease, unspecified: Secondary | ICD-10-CM | POA: Diagnosis not present

## 2018-07-15 DIAGNOSIS — R278 Other lack of coordination: Secondary | ICD-10-CM | POA: Diagnosis not present

## 2018-07-15 DIAGNOSIS — R296 Repeated falls: Secondary | ICD-10-CM | POA: Diagnosis not present

## 2018-07-15 DIAGNOSIS — R2689 Other abnormalities of gait and mobility: Secondary | ICD-10-CM | POA: Diagnosis not present

## 2018-07-15 DIAGNOSIS — M6281 Muscle weakness (generalized): Secondary | ICD-10-CM | POA: Diagnosis not present

## 2018-07-18 DIAGNOSIS — R278 Other lack of coordination: Secondary | ICD-10-CM | POA: Diagnosis not present

## 2018-07-18 DIAGNOSIS — R296 Repeated falls: Secondary | ICD-10-CM | POA: Diagnosis not present

## 2018-07-18 DIAGNOSIS — Z9181 History of falling: Secondary | ICD-10-CM | POA: Diagnosis not present

## 2018-07-18 DIAGNOSIS — M6281 Muscle weakness (generalized): Secondary | ICD-10-CM | POA: Diagnosis not present

## 2018-07-18 DIAGNOSIS — J449 Chronic obstructive pulmonary disease, unspecified: Secondary | ICD-10-CM | POA: Diagnosis not present

## 2018-07-18 DIAGNOSIS — R2689 Other abnormalities of gait and mobility: Secondary | ICD-10-CM | POA: Diagnosis not present

## 2018-07-19 DIAGNOSIS — M6281 Muscle weakness (generalized): Secondary | ICD-10-CM | POA: Diagnosis not present

## 2018-07-19 DIAGNOSIS — R296 Repeated falls: Secondary | ICD-10-CM | POA: Diagnosis not present

## 2018-07-19 DIAGNOSIS — R2689 Other abnormalities of gait and mobility: Secondary | ICD-10-CM | POA: Diagnosis not present

## 2018-07-19 DIAGNOSIS — J449 Chronic obstructive pulmonary disease, unspecified: Secondary | ICD-10-CM | POA: Diagnosis not present

## 2018-07-19 DIAGNOSIS — Z9181 History of falling: Secondary | ICD-10-CM | POA: Diagnosis not present

## 2018-07-19 DIAGNOSIS — R278 Other lack of coordination: Secondary | ICD-10-CM | POA: Diagnosis not present

## 2018-07-20 DIAGNOSIS — M6281 Muscle weakness (generalized): Secondary | ICD-10-CM | POA: Diagnosis not present

## 2018-07-20 DIAGNOSIS — R2689 Other abnormalities of gait and mobility: Secondary | ICD-10-CM | POA: Diagnosis not present

## 2018-07-20 DIAGNOSIS — Z9181 History of falling: Secondary | ICD-10-CM | POA: Diagnosis not present

## 2018-07-20 DIAGNOSIS — R278 Other lack of coordination: Secondary | ICD-10-CM | POA: Diagnosis not present

## 2018-07-20 DIAGNOSIS — J449 Chronic obstructive pulmonary disease, unspecified: Secondary | ICD-10-CM | POA: Diagnosis not present

## 2018-07-20 DIAGNOSIS — R296 Repeated falls: Secondary | ICD-10-CM | POA: Diagnosis not present

## 2018-07-21 DIAGNOSIS — R143 Flatulence: Secondary | ICD-10-CM | POA: Diagnosis not present

## 2018-07-21 DIAGNOSIS — K219 Gastro-esophageal reflux disease without esophagitis: Secondary | ICD-10-CM | POA: Diagnosis not present

## 2018-07-21 DIAGNOSIS — J449 Chronic obstructive pulmonary disease, unspecified: Secondary | ICD-10-CM | POA: Diagnosis not present

## 2018-07-21 DIAGNOSIS — U071 COVID-19: Secondary | ICD-10-CM | POA: Diagnosis not present

## 2018-07-21 DIAGNOSIS — J45998 Other asthma: Secondary | ICD-10-CM | POA: Diagnosis not present

## 2018-07-21 DIAGNOSIS — R2689 Other abnormalities of gait and mobility: Secondary | ICD-10-CM | POA: Diagnosis not present

## 2018-07-21 DIAGNOSIS — M6281 Muscle weakness (generalized): Secondary | ICD-10-CM | POA: Diagnosis not present

## 2018-07-21 DIAGNOSIS — B349 Viral infection, unspecified: Secondary | ICD-10-CM | POA: Diagnosis not present

## 2018-07-21 DIAGNOSIS — R41841 Cognitive communication deficit: Secondary | ICD-10-CM | POA: Diagnosis not present

## 2018-07-21 DIAGNOSIS — R2681 Unsteadiness on feet: Secondary | ICD-10-CM | POA: Diagnosis not present

## 2018-07-26 DIAGNOSIS — K219 Gastro-esophageal reflux disease without esophagitis: Secondary | ICD-10-CM | POA: Diagnosis not present

## 2018-07-26 DIAGNOSIS — R143 Flatulence: Secondary | ICD-10-CM | POA: Diagnosis not present

## 2018-07-26 DIAGNOSIS — U071 COVID-19: Secondary | ICD-10-CM | POA: Diagnosis not present

## 2018-07-26 DIAGNOSIS — J45998 Other asthma: Secondary | ICD-10-CM | POA: Diagnosis not present

## 2018-08-04 DIAGNOSIS — M6281 Muscle weakness (generalized): Secondary | ICD-10-CM | POA: Diagnosis not present

## 2018-08-04 DIAGNOSIS — Z9181 History of falling: Secondary | ICD-10-CM | POA: Diagnosis not present

## 2018-08-04 DIAGNOSIS — U071 COVID-19: Secondary | ICD-10-CM | POA: Diagnosis not present

## 2018-08-04 DIAGNOSIS — I10 Essential (primary) hypertension: Secondary | ICD-10-CM | POA: Diagnosis not present

## 2018-08-04 DIAGNOSIS — R2689 Other abnormalities of gait and mobility: Secondary | ICD-10-CM | POA: Diagnosis not present

## 2018-08-04 DIAGNOSIS — R2681 Unsteadiness on feet: Secondary | ICD-10-CM | POA: Diagnosis not present

## 2018-08-04 DIAGNOSIS — R278 Other lack of coordination: Secondary | ICD-10-CM | POA: Diagnosis not present

## 2018-08-04 DIAGNOSIS — R296 Repeated falls: Secondary | ICD-10-CM | POA: Diagnosis not present

## 2018-08-04 DIAGNOSIS — J45909 Unspecified asthma, uncomplicated: Secondary | ICD-10-CM | POA: Diagnosis not present

## 2018-08-04 DIAGNOSIS — F39 Unspecified mood [affective] disorder: Secondary | ICD-10-CM | POA: Diagnosis not present

## 2018-08-05 DIAGNOSIS — R296 Repeated falls: Secondary | ICD-10-CM | POA: Diagnosis not present

## 2018-08-05 DIAGNOSIS — M6281 Muscle weakness (generalized): Secondary | ICD-10-CM | POA: Diagnosis not present

## 2018-08-05 DIAGNOSIS — R2681 Unsteadiness on feet: Secondary | ICD-10-CM | POA: Diagnosis not present

## 2018-08-05 DIAGNOSIS — R2689 Other abnormalities of gait and mobility: Secondary | ICD-10-CM | POA: Diagnosis not present

## 2018-08-05 DIAGNOSIS — Z9181 History of falling: Secondary | ICD-10-CM | POA: Diagnosis not present

## 2018-08-05 DIAGNOSIS — U071 COVID-19: Secondary | ICD-10-CM | POA: Diagnosis not present

## 2018-08-07 DIAGNOSIS — Z9181 History of falling: Secondary | ICD-10-CM | POA: Diagnosis not present

## 2018-08-07 DIAGNOSIS — R2689 Other abnormalities of gait and mobility: Secondary | ICD-10-CM | POA: Diagnosis not present

## 2018-08-07 DIAGNOSIS — M6281 Muscle weakness (generalized): Secondary | ICD-10-CM | POA: Diagnosis not present

## 2018-08-07 DIAGNOSIS — R2681 Unsteadiness on feet: Secondary | ICD-10-CM | POA: Diagnosis not present

## 2018-08-07 DIAGNOSIS — U071 COVID-19: Secondary | ICD-10-CM | POA: Diagnosis not present

## 2018-08-07 DIAGNOSIS — R296 Repeated falls: Secondary | ICD-10-CM | POA: Diagnosis not present

## 2018-08-08 DIAGNOSIS — Z9181 History of falling: Secondary | ICD-10-CM | POA: Diagnosis not present

## 2018-08-08 DIAGNOSIS — R2681 Unsteadiness on feet: Secondary | ICD-10-CM | POA: Diagnosis not present

## 2018-08-08 DIAGNOSIS — U071 COVID-19: Secondary | ICD-10-CM | POA: Diagnosis not present

## 2018-08-08 DIAGNOSIS — M6281 Muscle weakness (generalized): Secondary | ICD-10-CM | POA: Diagnosis not present

## 2018-08-08 DIAGNOSIS — R296 Repeated falls: Secondary | ICD-10-CM | POA: Diagnosis not present

## 2018-08-08 DIAGNOSIS — R2689 Other abnormalities of gait and mobility: Secondary | ICD-10-CM | POA: Diagnosis not present

## 2018-08-09 DIAGNOSIS — R2681 Unsteadiness on feet: Secondary | ICD-10-CM | POA: Diagnosis not present

## 2018-08-09 DIAGNOSIS — Z9181 History of falling: Secondary | ICD-10-CM | POA: Diagnosis not present

## 2018-08-09 DIAGNOSIS — M6281 Muscle weakness (generalized): Secondary | ICD-10-CM | POA: Diagnosis not present

## 2018-08-09 DIAGNOSIS — R296 Repeated falls: Secondary | ICD-10-CM | POA: Diagnosis not present

## 2018-08-09 DIAGNOSIS — U071 COVID-19: Secondary | ICD-10-CM | POA: Diagnosis not present

## 2018-08-09 DIAGNOSIS — R2689 Other abnormalities of gait and mobility: Secondary | ICD-10-CM | POA: Diagnosis not present

## 2018-08-10 DIAGNOSIS — R2689 Other abnormalities of gait and mobility: Secondary | ICD-10-CM | POA: Diagnosis not present

## 2018-08-10 DIAGNOSIS — Z9181 History of falling: Secondary | ICD-10-CM | POA: Diagnosis not present

## 2018-08-10 DIAGNOSIS — M6281 Muscle weakness (generalized): Secondary | ICD-10-CM | POA: Diagnosis not present

## 2018-08-10 DIAGNOSIS — R2681 Unsteadiness on feet: Secondary | ICD-10-CM | POA: Diagnosis not present

## 2018-08-10 DIAGNOSIS — R296 Repeated falls: Secondary | ICD-10-CM | POA: Diagnosis not present

## 2018-08-10 DIAGNOSIS — U071 COVID-19: Secondary | ICD-10-CM | POA: Diagnosis not present

## 2018-08-11 DIAGNOSIS — M6281 Muscle weakness (generalized): Secondary | ICD-10-CM | POA: Diagnosis not present

## 2018-08-11 DIAGNOSIS — R2689 Other abnormalities of gait and mobility: Secondary | ICD-10-CM | POA: Diagnosis not present

## 2018-08-11 DIAGNOSIS — R296 Repeated falls: Secondary | ICD-10-CM | POA: Diagnosis not present

## 2018-08-11 DIAGNOSIS — R2681 Unsteadiness on feet: Secondary | ICD-10-CM | POA: Diagnosis not present

## 2018-08-11 DIAGNOSIS — Z9181 History of falling: Secondary | ICD-10-CM | POA: Diagnosis not present

## 2018-08-11 DIAGNOSIS — U071 COVID-19: Secondary | ICD-10-CM | POA: Diagnosis not present

## 2018-08-12 DIAGNOSIS — R296 Repeated falls: Secondary | ICD-10-CM | POA: Diagnosis not present

## 2018-08-12 DIAGNOSIS — M6281 Muscle weakness (generalized): Secondary | ICD-10-CM | POA: Diagnosis not present

## 2018-08-12 DIAGNOSIS — R2689 Other abnormalities of gait and mobility: Secondary | ICD-10-CM | POA: Diagnosis not present

## 2018-08-12 DIAGNOSIS — U071 COVID-19: Secondary | ICD-10-CM | POA: Diagnosis not present

## 2018-08-12 DIAGNOSIS — R2681 Unsteadiness on feet: Secondary | ICD-10-CM | POA: Diagnosis not present

## 2018-08-12 DIAGNOSIS — Z9181 History of falling: Secondary | ICD-10-CM | POA: Diagnosis not present

## 2018-08-13 DIAGNOSIS — M6281 Muscle weakness (generalized): Secondary | ICD-10-CM | POA: Diagnosis not present

## 2018-08-13 DIAGNOSIS — R296 Repeated falls: Secondary | ICD-10-CM | POA: Diagnosis not present

## 2018-08-13 DIAGNOSIS — R2681 Unsteadiness on feet: Secondary | ICD-10-CM | POA: Diagnosis not present

## 2018-08-13 DIAGNOSIS — Z9181 History of falling: Secondary | ICD-10-CM | POA: Diagnosis not present

## 2018-08-13 DIAGNOSIS — R2689 Other abnormalities of gait and mobility: Secondary | ICD-10-CM | POA: Diagnosis not present

## 2018-08-13 DIAGNOSIS — U071 COVID-19: Secondary | ICD-10-CM | POA: Diagnosis not present

## 2018-08-15 DIAGNOSIS — R296 Repeated falls: Secondary | ICD-10-CM | POA: Diagnosis not present

## 2018-08-15 DIAGNOSIS — Z9181 History of falling: Secondary | ICD-10-CM | POA: Diagnosis not present

## 2018-08-15 DIAGNOSIS — R2689 Other abnormalities of gait and mobility: Secondary | ICD-10-CM | POA: Diagnosis not present

## 2018-08-15 DIAGNOSIS — R2681 Unsteadiness on feet: Secondary | ICD-10-CM | POA: Diagnosis not present

## 2018-08-15 DIAGNOSIS — M6281 Muscle weakness (generalized): Secondary | ICD-10-CM | POA: Diagnosis not present

## 2018-08-15 DIAGNOSIS — U071 COVID-19: Secondary | ICD-10-CM | POA: Diagnosis not present

## 2018-08-16 DIAGNOSIS — R296 Repeated falls: Secondary | ICD-10-CM | POA: Diagnosis not present

## 2018-08-16 DIAGNOSIS — M6281 Muscle weakness (generalized): Secondary | ICD-10-CM | POA: Diagnosis not present

## 2018-08-16 DIAGNOSIS — R2681 Unsteadiness on feet: Secondary | ICD-10-CM | POA: Diagnosis not present

## 2018-08-16 DIAGNOSIS — U071 COVID-19: Secondary | ICD-10-CM | POA: Diagnosis not present

## 2018-08-16 DIAGNOSIS — Z9181 History of falling: Secondary | ICD-10-CM | POA: Diagnosis not present

## 2018-08-16 DIAGNOSIS — R2689 Other abnormalities of gait and mobility: Secondary | ICD-10-CM | POA: Diagnosis not present

## 2018-08-17 DIAGNOSIS — R296 Repeated falls: Secondary | ICD-10-CM | POA: Diagnosis not present

## 2018-08-17 DIAGNOSIS — Z9181 History of falling: Secondary | ICD-10-CM | POA: Diagnosis not present

## 2018-08-17 DIAGNOSIS — R2681 Unsteadiness on feet: Secondary | ICD-10-CM | POA: Diagnosis not present

## 2018-08-17 DIAGNOSIS — R2689 Other abnormalities of gait and mobility: Secondary | ICD-10-CM | POA: Diagnosis not present

## 2018-08-17 DIAGNOSIS — M6281 Muscle weakness (generalized): Secondary | ICD-10-CM | POA: Diagnosis not present

## 2018-08-17 DIAGNOSIS — U071 COVID-19: Secondary | ICD-10-CM | POA: Diagnosis not present

## 2018-08-18 DIAGNOSIS — R2689 Other abnormalities of gait and mobility: Secondary | ICD-10-CM | POA: Diagnosis not present

## 2018-08-18 DIAGNOSIS — U071 COVID-19: Secondary | ICD-10-CM | POA: Diagnosis not present

## 2018-08-18 DIAGNOSIS — R2681 Unsteadiness on feet: Secondary | ICD-10-CM | POA: Diagnosis not present

## 2018-08-18 DIAGNOSIS — R296 Repeated falls: Secondary | ICD-10-CM | POA: Diagnosis not present

## 2018-08-18 DIAGNOSIS — Z9181 History of falling: Secondary | ICD-10-CM | POA: Diagnosis not present

## 2018-08-18 DIAGNOSIS — M6281 Muscle weakness (generalized): Secondary | ICD-10-CM | POA: Diagnosis not present

## 2018-08-19 DIAGNOSIS — Z9181 History of falling: Secondary | ICD-10-CM | POA: Diagnosis not present

## 2018-08-19 DIAGNOSIS — J4530 Mild persistent asthma, uncomplicated: Secondary | ICD-10-CM | POA: Diagnosis not present

## 2018-08-19 DIAGNOSIS — R296 Repeated falls: Secondary | ICD-10-CM | POA: Diagnosis not present

## 2018-08-19 DIAGNOSIS — M6281 Muscle weakness (generalized): Secondary | ICD-10-CM | POA: Diagnosis not present

## 2018-08-19 DIAGNOSIS — U071 COVID-19: Secondary | ICD-10-CM | POA: Diagnosis not present

## 2018-08-19 DIAGNOSIS — R2681 Unsteadiness on feet: Secondary | ICD-10-CM | POA: Diagnosis not present

## 2018-08-19 DIAGNOSIS — R2689 Other abnormalities of gait and mobility: Secondary | ICD-10-CM | POA: Diagnosis not present

## 2018-08-22 DIAGNOSIS — R2681 Unsteadiness on feet: Secondary | ICD-10-CM | POA: Diagnosis not present

## 2018-08-22 DIAGNOSIS — R2689 Other abnormalities of gait and mobility: Secondary | ICD-10-CM | POA: Diagnosis not present

## 2018-08-22 DIAGNOSIS — M6281 Muscle weakness (generalized): Secondary | ICD-10-CM | POA: Diagnosis not present

## 2018-08-22 DIAGNOSIS — R296 Repeated falls: Secondary | ICD-10-CM | POA: Diagnosis not present

## 2018-08-22 DIAGNOSIS — U071 COVID-19: Secondary | ICD-10-CM | POA: Diagnosis not present

## 2018-08-22 DIAGNOSIS — Z9181 History of falling: Secondary | ICD-10-CM | POA: Diagnosis not present

## 2018-08-22 DIAGNOSIS — B342 Coronavirus infection, unspecified: Secondary | ICD-10-CM | POA: Diagnosis not present

## 2018-08-23 DIAGNOSIS — Z9181 History of falling: Secondary | ICD-10-CM | POA: Diagnosis not present

## 2018-08-23 DIAGNOSIS — R2681 Unsteadiness on feet: Secondary | ICD-10-CM | POA: Diagnosis not present

## 2018-08-23 DIAGNOSIS — R2689 Other abnormalities of gait and mobility: Secondary | ICD-10-CM | POA: Diagnosis not present

## 2018-08-23 DIAGNOSIS — M6281 Muscle weakness (generalized): Secondary | ICD-10-CM | POA: Diagnosis not present

## 2018-08-23 DIAGNOSIS — R296 Repeated falls: Secondary | ICD-10-CM | POA: Diagnosis not present

## 2018-08-23 DIAGNOSIS — U071 COVID-19: Secondary | ICD-10-CM | POA: Diagnosis not present

## 2018-08-24 DIAGNOSIS — R296 Repeated falls: Secondary | ICD-10-CM | POA: Diagnosis not present

## 2018-08-24 DIAGNOSIS — M6281 Muscle weakness (generalized): Secondary | ICD-10-CM | POA: Diagnosis not present

## 2018-08-24 DIAGNOSIS — R2689 Other abnormalities of gait and mobility: Secondary | ICD-10-CM | POA: Diagnosis not present

## 2018-08-24 DIAGNOSIS — U071 COVID-19: Secondary | ICD-10-CM | POA: Diagnosis not present

## 2018-08-24 DIAGNOSIS — R2681 Unsteadiness on feet: Secondary | ICD-10-CM | POA: Diagnosis not present

## 2018-08-24 DIAGNOSIS — R278 Other lack of coordination: Secondary | ICD-10-CM | POA: Diagnosis not present

## 2018-08-24 DIAGNOSIS — Z9181 History of falling: Secondary | ICD-10-CM | POA: Diagnosis not present

## 2018-08-25 DIAGNOSIS — R296 Repeated falls: Secondary | ICD-10-CM | POA: Diagnosis not present

## 2018-08-25 DIAGNOSIS — R2689 Other abnormalities of gait and mobility: Secondary | ICD-10-CM | POA: Diagnosis not present

## 2018-08-25 DIAGNOSIS — Z9181 History of falling: Secondary | ICD-10-CM | POA: Diagnosis not present

## 2018-08-25 DIAGNOSIS — U071 COVID-19: Secondary | ICD-10-CM | POA: Diagnosis not present

## 2018-08-25 DIAGNOSIS — M6281 Muscle weakness (generalized): Secondary | ICD-10-CM | POA: Diagnosis not present

## 2018-08-25 DIAGNOSIS — R2681 Unsteadiness on feet: Secondary | ICD-10-CM | POA: Diagnosis not present

## 2018-08-26 DIAGNOSIS — U071 COVID-19: Secondary | ICD-10-CM | POA: Diagnosis not present

## 2018-08-26 DIAGNOSIS — M6281 Muscle weakness (generalized): Secondary | ICD-10-CM | POA: Diagnosis not present

## 2018-08-26 DIAGNOSIS — R2689 Other abnormalities of gait and mobility: Secondary | ICD-10-CM | POA: Diagnosis not present

## 2018-08-26 DIAGNOSIS — Z9181 History of falling: Secondary | ICD-10-CM | POA: Diagnosis not present

## 2018-08-26 DIAGNOSIS — R2681 Unsteadiness on feet: Secondary | ICD-10-CM | POA: Diagnosis not present

## 2018-08-26 DIAGNOSIS — R296 Repeated falls: Secondary | ICD-10-CM | POA: Diagnosis not present

## 2018-08-27 DIAGNOSIS — U071 COVID-19: Secondary | ICD-10-CM | POA: Diagnosis not present

## 2018-08-27 DIAGNOSIS — M6281 Muscle weakness (generalized): Secondary | ICD-10-CM | POA: Diagnosis not present

## 2018-08-27 DIAGNOSIS — R2689 Other abnormalities of gait and mobility: Secondary | ICD-10-CM | POA: Diagnosis not present

## 2018-08-27 DIAGNOSIS — R296 Repeated falls: Secondary | ICD-10-CM | POA: Diagnosis not present

## 2018-08-27 DIAGNOSIS — R2681 Unsteadiness on feet: Secondary | ICD-10-CM | POA: Diagnosis not present

## 2018-08-27 DIAGNOSIS — Z9181 History of falling: Secondary | ICD-10-CM | POA: Diagnosis not present

## 2018-08-28 DIAGNOSIS — R296 Repeated falls: Secondary | ICD-10-CM | POA: Diagnosis not present

## 2018-08-28 DIAGNOSIS — U071 COVID-19: Secondary | ICD-10-CM | POA: Diagnosis not present

## 2018-08-28 DIAGNOSIS — M6281 Muscle weakness (generalized): Secondary | ICD-10-CM | POA: Diagnosis not present

## 2018-08-28 DIAGNOSIS — R2681 Unsteadiness on feet: Secondary | ICD-10-CM | POA: Diagnosis not present

## 2018-08-28 DIAGNOSIS — R2689 Other abnormalities of gait and mobility: Secondary | ICD-10-CM | POA: Diagnosis not present

## 2018-08-28 DIAGNOSIS — Z9181 History of falling: Secondary | ICD-10-CM | POA: Diagnosis not present

## 2018-08-29 DIAGNOSIS — M6281 Muscle weakness (generalized): Secondary | ICD-10-CM | POA: Diagnosis not present

## 2018-08-29 DIAGNOSIS — R2681 Unsteadiness on feet: Secondary | ICD-10-CM | POA: Diagnosis not present

## 2018-08-29 DIAGNOSIS — U071 COVID-19: Secondary | ICD-10-CM | POA: Diagnosis not present

## 2018-08-29 DIAGNOSIS — R2689 Other abnormalities of gait and mobility: Secondary | ICD-10-CM | POA: Diagnosis not present

## 2018-08-29 DIAGNOSIS — Z9181 History of falling: Secondary | ICD-10-CM | POA: Diagnosis not present

## 2018-08-29 DIAGNOSIS — R296 Repeated falls: Secondary | ICD-10-CM | POA: Diagnosis not present

## 2018-08-30 DIAGNOSIS — M6281 Muscle weakness (generalized): Secondary | ICD-10-CM | POA: Diagnosis not present

## 2018-08-30 DIAGNOSIS — R296 Repeated falls: Secondary | ICD-10-CM | POA: Diagnosis not present

## 2018-08-30 DIAGNOSIS — U071 COVID-19: Secondary | ICD-10-CM | POA: Diagnosis not present

## 2018-08-30 DIAGNOSIS — Z9181 History of falling: Secondary | ICD-10-CM | POA: Diagnosis not present

## 2018-08-30 DIAGNOSIS — R2689 Other abnormalities of gait and mobility: Secondary | ICD-10-CM | POA: Diagnosis not present

## 2018-08-30 DIAGNOSIS — R2681 Unsteadiness on feet: Secondary | ICD-10-CM | POA: Diagnosis not present

## 2018-08-31 DIAGNOSIS — M6281 Muscle weakness (generalized): Secondary | ICD-10-CM | POA: Diagnosis not present

## 2018-08-31 DIAGNOSIS — R2681 Unsteadiness on feet: Secondary | ICD-10-CM | POA: Diagnosis not present

## 2018-08-31 DIAGNOSIS — U071 COVID-19: Secondary | ICD-10-CM | POA: Diagnosis not present

## 2018-08-31 DIAGNOSIS — R296 Repeated falls: Secondary | ICD-10-CM | POA: Diagnosis not present

## 2018-08-31 DIAGNOSIS — R2689 Other abnormalities of gait and mobility: Secondary | ICD-10-CM | POA: Diagnosis not present

## 2018-08-31 DIAGNOSIS — Z9181 History of falling: Secondary | ICD-10-CM | POA: Diagnosis not present

## 2018-09-01 DIAGNOSIS — M6281 Muscle weakness (generalized): Secondary | ICD-10-CM | POA: Diagnosis not present

## 2018-09-01 DIAGNOSIS — R2689 Other abnormalities of gait and mobility: Secondary | ICD-10-CM | POA: Diagnosis not present

## 2018-09-01 DIAGNOSIS — R2681 Unsteadiness on feet: Secondary | ICD-10-CM | POA: Diagnosis not present

## 2018-09-01 DIAGNOSIS — R296 Repeated falls: Secondary | ICD-10-CM | POA: Diagnosis not present

## 2018-09-01 DIAGNOSIS — U071 COVID-19: Secondary | ICD-10-CM | POA: Diagnosis not present

## 2018-09-01 DIAGNOSIS — Z9181 History of falling: Secondary | ICD-10-CM | POA: Diagnosis not present

## 2018-09-02 DIAGNOSIS — R296 Repeated falls: Secondary | ICD-10-CM | POA: Diagnosis not present

## 2018-09-02 DIAGNOSIS — R2681 Unsteadiness on feet: Secondary | ICD-10-CM | POA: Diagnosis not present

## 2018-09-02 DIAGNOSIS — M6281 Muscle weakness (generalized): Secondary | ICD-10-CM | POA: Diagnosis not present

## 2018-09-02 DIAGNOSIS — U071 COVID-19: Secondary | ICD-10-CM | POA: Diagnosis not present

## 2018-09-02 DIAGNOSIS — Z9181 History of falling: Secondary | ICD-10-CM | POA: Diagnosis not present

## 2018-09-02 DIAGNOSIS — R2689 Other abnormalities of gait and mobility: Secondary | ICD-10-CM | POA: Diagnosis not present

## 2018-09-03 DIAGNOSIS — R296 Repeated falls: Secondary | ICD-10-CM | POA: Diagnosis not present

## 2018-09-03 DIAGNOSIS — M6281 Muscle weakness (generalized): Secondary | ICD-10-CM | POA: Diagnosis not present

## 2018-09-03 DIAGNOSIS — Z9181 History of falling: Secondary | ICD-10-CM | POA: Diagnosis not present

## 2018-09-03 DIAGNOSIS — R2681 Unsteadiness on feet: Secondary | ICD-10-CM | POA: Diagnosis not present

## 2018-09-03 DIAGNOSIS — U071 COVID-19: Secondary | ICD-10-CM | POA: Diagnosis not present

## 2018-09-03 DIAGNOSIS — R2689 Other abnormalities of gait and mobility: Secondary | ICD-10-CM | POA: Diagnosis not present

## 2018-09-04 DIAGNOSIS — M6281 Muscle weakness (generalized): Secondary | ICD-10-CM | POA: Diagnosis not present

## 2018-09-04 DIAGNOSIS — R2681 Unsteadiness on feet: Secondary | ICD-10-CM | POA: Diagnosis not present

## 2018-09-04 DIAGNOSIS — R296 Repeated falls: Secondary | ICD-10-CM | POA: Diagnosis not present

## 2018-09-04 DIAGNOSIS — R2689 Other abnormalities of gait and mobility: Secondary | ICD-10-CM | POA: Diagnosis not present

## 2018-09-04 DIAGNOSIS — U071 COVID-19: Secondary | ICD-10-CM | POA: Diagnosis not present

## 2018-09-04 DIAGNOSIS — Z9181 History of falling: Secondary | ICD-10-CM | POA: Diagnosis not present

## 2018-09-05 DIAGNOSIS — R2689 Other abnormalities of gait and mobility: Secondary | ICD-10-CM | POA: Diagnosis not present

## 2018-09-05 DIAGNOSIS — M6281 Muscle weakness (generalized): Secondary | ICD-10-CM | POA: Diagnosis not present

## 2018-09-05 DIAGNOSIS — R296 Repeated falls: Secondary | ICD-10-CM | POA: Diagnosis not present

## 2018-09-05 DIAGNOSIS — R2681 Unsteadiness on feet: Secondary | ICD-10-CM | POA: Diagnosis not present

## 2018-09-05 DIAGNOSIS — U071 COVID-19: Secondary | ICD-10-CM | POA: Diagnosis not present

## 2018-09-05 DIAGNOSIS — Z9181 History of falling: Secondary | ICD-10-CM | POA: Diagnosis not present

## 2018-09-06 DIAGNOSIS — M6281 Muscle weakness (generalized): Secondary | ICD-10-CM | POA: Diagnosis not present

## 2018-09-06 DIAGNOSIS — Z9181 History of falling: Secondary | ICD-10-CM | POA: Diagnosis not present

## 2018-09-06 DIAGNOSIS — R296 Repeated falls: Secondary | ICD-10-CM | POA: Diagnosis not present

## 2018-09-06 DIAGNOSIS — U071 COVID-19: Secondary | ICD-10-CM | POA: Diagnosis not present

## 2018-09-06 DIAGNOSIS — R2681 Unsteadiness on feet: Secondary | ICD-10-CM | POA: Diagnosis not present

## 2018-09-06 DIAGNOSIS — R2689 Other abnormalities of gait and mobility: Secondary | ICD-10-CM | POA: Diagnosis not present

## 2018-09-08 DIAGNOSIS — U071 COVID-19: Secondary | ICD-10-CM | POA: Diagnosis not present

## 2018-09-08 DIAGNOSIS — R296 Repeated falls: Secondary | ICD-10-CM | POA: Diagnosis not present

## 2018-09-08 DIAGNOSIS — Z9181 History of falling: Secondary | ICD-10-CM | POA: Diagnosis not present

## 2018-09-08 DIAGNOSIS — M6281 Muscle weakness (generalized): Secondary | ICD-10-CM | POA: Diagnosis not present

## 2018-09-08 DIAGNOSIS — R2681 Unsteadiness on feet: Secondary | ICD-10-CM | POA: Diagnosis not present

## 2018-09-08 DIAGNOSIS — R2689 Other abnormalities of gait and mobility: Secondary | ICD-10-CM | POA: Diagnosis not present

## 2018-09-09 DIAGNOSIS — R296 Repeated falls: Secondary | ICD-10-CM | POA: Diagnosis not present

## 2018-09-09 DIAGNOSIS — M6281 Muscle weakness (generalized): Secondary | ICD-10-CM | POA: Diagnosis not present

## 2018-09-09 DIAGNOSIS — Z9181 History of falling: Secondary | ICD-10-CM | POA: Diagnosis not present

## 2018-09-09 DIAGNOSIS — U071 COVID-19: Secondary | ICD-10-CM | POA: Diagnosis not present

## 2018-09-09 DIAGNOSIS — R2689 Other abnormalities of gait and mobility: Secondary | ICD-10-CM | POA: Diagnosis not present

## 2018-09-09 DIAGNOSIS — R2681 Unsteadiness on feet: Secondary | ICD-10-CM | POA: Diagnosis not present

## 2018-09-10 DIAGNOSIS — U071 COVID-19: Secondary | ICD-10-CM | POA: Diagnosis not present

## 2018-09-10 DIAGNOSIS — R2689 Other abnormalities of gait and mobility: Secondary | ICD-10-CM | POA: Diagnosis not present

## 2018-09-10 DIAGNOSIS — R296 Repeated falls: Secondary | ICD-10-CM | POA: Diagnosis not present

## 2018-09-10 DIAGNOSIS — R2681 Unsteadiness on feet: Secondary | ICD-10-CM | POA: Diagnosis not present

## 2018-09-10 DIAGNOSIS — M6281 Muscle weakness (generalized): Secondary | ICD-10-CM | POA: Diagnosis not present

## 2018-09-10 DIAGNOSIS — Z9181 History of falling: Secondary | ICD-10-CM | POA: Diagnosis not present

## 2018-09-13 DIAGNOSIS — U071 COVID-19: Secondary | ICD-10-CM | POA: Diagnosis not present

## 2018-09-13 DIAGNOSIS — R2689 Other abnormalities of gait and mobility: Secondary | ICD-10-CM | POA: Diagnosis not present

## 2018-09-13 DIAGNOSIS — R2681 Unsteadiness on feet: Secondary | ICD-10-CM | POA: Diagnosis not present

## 2018-09-13 DIAGNOSIS — Z9181 History of falling: Secondary | ICD-10-CM | POA: Diagnosis not present

## 2018-09-13 DIAGNOSIS — R296 Repeated falls: Secondary | ICD-10-CM | POA: Diagnosis not present

## 2018-09-13 DIAGNOSIS — M6281 Muscle weakness (generalized): Secondary | ICD-10-CM | POA: Diagnosis not present

## 2018-09-14 DIAGNOSIS — R2681 Unsteadiness on feet: Secondary | ICD-10-CM | POA: Diagnosis not present

## 2018-09-14 DIAGNOSIS — Z9181 History of falling: Secondary | ICD-10-CM | POA: Diagnosis not present

## 2018-09-14 DIAGNOSIS — M6281 Muscle weakness (generalized): Secondary | ICD-10-CM | POA: Diagnosis not present

## 2018-09-14 DIAGNOSIS — R296 Repeated falls: Secondary | ICD-10-CM | POA: Diagnosis not present

## 2018-09-14 DIAGNOSIS — U071 COVID-19: Secondary | ICD-10-CM | POA: Diagnosis not present

## 2018-09-14 DIAGNOSIS — R2689 Other abnormalities of gait and mobility: Secondary | ICD-10-CM | POA: Diagnosis not present

## 2018-09-15 DIAGNOSIS — M6281 Muscle weakness (generalized): Secondary | ICD-10-CM | POA: Diagnosis not present

## 2018-09-15 DIAGNOSIS — R296 Repeated falls: Secondary | ICD-10-CM | POA: Diagnosis not present

## 2018-09-15 DIAGNOSIS — R2689 Other abnormalities of gait and mobility: Secondary | ICD-10-CM | POA: Diagnosis not present

## 2018-09-15 DIAGNOSIS — Z9181 History of falling: Secondary | ICD-10-CM | POA: Diagnosis not present

## 2018-09-15 DIAGNOSIS — U071 COVID-19: Secondary | ICD-10-CM | POA: Diagnosis not present

## 2018-09-15 DIAGNOSIS — R2681 Unsteadiness on feet: Secondary | ICD-10-CM | POA: Diagnosis not present

## 2018-09-16 DIAGNOSIS — Z9181 History of falling: Secondary | ICD-10-CM | POA: Diagnosis not present

## 2018-09-16 DIAGNOSIS — R296 Repeated falls: Secondary | ICD-10-CM | POA: Diagnosis not present

## 2018-09-16 DIAGNOSIS — U071 COVID-19: Secondary | ICD-10-CM | POA: Diagnosis not present

## 2018-09-16 DIAGNOSIS — R2689 Other abnormalities of gait and mobility: Secondary | ICD-10-CM | POA: Diagnosis not present

## 2018-09-16 DIAGNOSIS — R2681 Unsteadiness on feet: Secondary | ICD-10-CM | POA: Diagnosis not present

## 2018-09-16 DIAGNOSIS — M6281 Muscle weakness (generalized): Secondary | ICD-10-CM | POA: Diagnosis not present

## 2018-09-17 DIAGNOSIS — R296 Repeated falls: Secondary | ICD-10-CM | POA: Diagnosis not present

## 2018-09-17 DIAGNOSIS — R2681 Unsteadiness on feet: Secondary | ICD-10-CM | POA: Diagnosis not present

## 2018-09-17 DIAGNOSIS — U071 COVID-19: Secondary | ICD-10-CM | POA: Diagnosis not present

## 2018-09-17 DIAGNOSIS — M6281 Muscle weakness (generalized): Secondary | ICD-10-CM | POA: Diagnosis not present

## 2018-09-17 DIAGNOSIS — R2689 Other abnormalities of gait and mobility: Secondary | ICD-10-CM | POA: Diagnosis not present

## 2018-09-17 DIAGNOSIS — Z9181 History of falling: Secondary | ICD-10-CM | POA: Diagnosis not present

## 2018-09-18 DIAGNOSIS — R296 Repeated falls: Secondary | ICD-10-CM | POA: Diagnosis not present

## 2018-09-18 DIAGNOSIS — R2689 Other abnormalities of gait and mobility: Secondary | ICD-10-CM | POA: Diagnosis not present

## 2018-09-18 DIAGNOSIS — R2681 Unsteadiness on feet: Secondary | ICD-10-CM | POA: Diagnosis not present

## 2018-09-18 DIAGNOSIS — Z9181 History of falling: Secondary | ICD-10-CM | POA: Diagnosis not present

## 2018-09-18 DIAGNOSIS — M6281 Muscle weakness (generalized): Secondary | ICD-10-CM | POA: Diagnosis not present

## 2018-09-18 DIAGNOSIS — U071 COVID-19: Secondary | ICD-10-CM | POA: Diagnosis not present

## 2018-09-19 DIAGNOSIS — R296 Repeated falls: Secondary | ICD-10-CM | POA: Diagnosis not present

## 2018-09-19 DIAGNOSIS — U071 COVID-19: Secondary | ICD-10-CM | POA: Diagnosis not present

## 2018-09-19 DIAGNOSIS — Z9181 History of falling: Secondary | ICD-10-CM | POA: Diagnosis not present

## 2018-09-19 DIAGNOSIS — M6281 Muscle weakness (generalized): Secondary | ICD-10-CM | POA: Diagnosis not present

## 2018-09-19 DIAGNOSIS — R2681 Unsteadiness on feet: Secondary | ICD-10-CM | POA: Diagnosis not present

## 2018-09-19 DIAGNOSIS — R2689 Other abnormalities of gait and mobility: Secondary | ICD-10-CM | POA: Diagnosis not present

## 2018-09-20 DIAGNOSIS — U071 COVID-19: Secondary | ICD-10-CM | POA: Diagnosis not present

## 2018-09-20 DIAGNOSIS — R296 Repeated falls: Secondary | ICD-10-CM | POA: Diagnosis not present

## 2018-09-20 DIAGNOSIS — R2681 Unsteadiness on feet: Secondary | ICD-10-CM | POA: Diagnosis not present

## 2018-09-20 DIAGNOSIS — M6281 Muscle weakness (generalized): Secondary | ICD-10-CM | POA: Diagnosis not present

## 2018-09-20 DIAGNOSIS — Z9181 History of falling: Secondary | ICD-10-CM | POA: Diagnosis not present

## 2018-09-20 DIAGNOSIS — R2689 Other abnormalities of gait and mobility: Secondary | ICD-10-CM | POA: Diagnosis not present

## 2018-09-21 DIAGNOSIS — R2681 Unsteadiness on feet: Secondary | ICD-10-CM | POA: Diagnosis not present

## 2018-09-21 DIAGNOSIS — Z9181 History of falling: Secondary | ICD-10-CM | POA: Diagnosis not present

## 2018-09-21 DIAGNOSIS — R296 Repeated falls: Secondary | ICD-10-CM | POA: Diagnosis not present

## 2018-09-21 DIAGNOSIS — U071 COVID-19: Secondary | ICD-10-CM | POA: Diagnosis not present

## 2018-09-21 DIAGNOSIS — R2689 Other abnormalities of gait and mobility: Secondary | ICD-10-CM | POA: Diagnosis not present

## 2018-09-21 DIAGNOSIS — M6281 Muscle weakness (generalized): Secondary | ICD-10-CM | POA: Diagnosis not present

## 2018-09-22 DIAGNOSIS — M6281 Muscle weakness (generalized): Secondary | ICD-10-CM | POA: Diagnosis not present

## 2018-09-22 DIAGNOSIS — R296 Repeated falls: Secondary | ICD-10-CM | POA: Diagnosis not present

## 2018-09-22 DIAGNOSIS — U071 COVID-19: Secondary | ICD-10-CM | POA: Diagnosis not present

## 2018-09-22 DIAGNOSIS — Z9181 History of falling: Secondary | ICD-10-CM | POA: Diagnosis not present

## 2018-09-22 DIAGNOSIS — R2681 Unsteadiness on feet: Secondary | ICD-10-CM | POA: Diagnosis not present

## 2018-09-22 DIAGNOSIS — R2689 Other abnormalities of gait and mobility: Secondary | ICD-10-CM | POA: Diagnosis not present

## 2018-09-23 ENCOUNTER — Other Ambulatory Visit: Payer: Self-pay

## 2018-09-24 DIAGNOSIS — Z9181 History of falling: Secondary | ICD-10-CM | POA: Diagnosis not present

## 2018-09-24 DIAGNOSIS — M6281 Muscle weakness (generalized): Secondary | ICD-10-CM | POA: Diagnosis not present

## 2018-09-24 DIAGNOSIS — U071 COVID-19: Secondary | ICD-10-CM | POA: Diagnosis not present

## 2018-09-24 DIAGNOSIS — R296 Repeated falls: Secondary | ICD-10-CM | POA: Diagnosis not present

## 2018-09-24 DIAGNOSIS — R2689 Other abnormalities of gait and mobility: Secondary | ICD-10-CM | POA: Diagnosis not present

## 2018-09-24 DIAGNOSIS — R2681 Unsteadiness on feet: Secondary | ICD-10-CM | POA: Diagnosis not present

## 2018-09-24 DIAGNOSIS — R278 Other lack of coordination: Secondary | ICD-10-CM | POA: Diagnosis not present

## 2018-09-26 DIAGNOSIS — B349 Viral infection, unspecified: Secondary | ICD-10-CM | POA: Diagnosis not present

## 2018-09-27 DIAGNOSIS — Z9181 History of falling: Secondary | ICD-10-CM | POA: Diagnosis not present

## 2018-09-27 DIAGNOSIS — U071 COVID-19: Secondary | ICD-10-CM | POA: Diagnosis not present

## 2018-09-27 DIAGNOSIS — R296 Repeated falls: Secondary | ICD-10-CM | POA: Diagnosis not present

## 2018-09-27 DIAGNOSIS — R2689 Other abnormalities of gait and mobility: Secondary | ICD-10-CM | POA: Diagnosis not present

## 2018-09-27 DIAGNOSIS — M6281 Muscle weakness (generalized): Secondary | ICD-10-CM | POA: Diagnosis not present

## 2018-09-27 DIAGNOSIS — R2681 Unsteadiness on feet: Secondary | ICD-10-CM | POA: Diagnosis not present

## 2018-09-28 DIAGNOSIS — R2689 Other abnormalities of gait and mobility: Secondary | ICD-10-CM | POA: Diagnosis not present

## 2018-09-28 DIAGNOSIS — Z9181 History of falling: Secondary | ICD-10-CM | POA: Diagnosis not present

## 2018-09-28 DIAGNOSIS — R296 Repeated falls: Secondary | ICD-10-CM | POA: Diagnosis not present

## 2018-09-28 DIAGNOSIS — U071 COVID-19: Secondary | ICD-10-CM | POA: Diagnosis not present

## 2018-09-28 DIAGNOSIS — M6281 Muscle weakness (generalized): Secondary | ICD-10-CM | POA: Diagnosis not present

## 2018-09-28 DIAGNOSIS — R2681 Unsteadiness on feet: Secondary | ICD-10-CM | POA: Diagnosis not present

## 2018-09-29 DIAGNOSIS — R296 Repeated falls: Secondary | ICD-10-CM | POA: Diagnosis not present

## 2018-09-29 DIAGNOSIS — M6281 Muscle weakness (generalized): Secondary | ICD-10-CM | POA: Diagnosis not present

## 2018-09-29 DIAGNOSIS — Z9181 History of falling: Secondary | ICD-10-CM | POA: Diagnosis not present

## 2018-09-29 DIAGNOSIS — U071 COVID-19: Secondary | ICD-10-CM | POA: Diagnosis not present

## 2018-09-29 DIAGNOSIS — R2689 Other abnormalities of gait and mobility: Secondary | ICD-10-CM | POA: Diagnosis not present

## 2018-09-29 DIAGNOSIS — R2681 Unsteadiness on feet: Secondary | ICD-10-CM | POA: Diagnosis not present

## 2018-09-30 DIAGNOSIS — Z9181 History of falling: Secondary | ICD-10-CM | POA: Diagnosis not present

## 2018-09-30 DIAGNOSIS — R296 Repeated falls: Secondary | ICD-10-CM | POA: Diagnosis not present

## 2018-09-30 DIAGNOSIS — M6281 Muscle weakness (generalized): Secondary | ICD-10-CM | POA: Diagnosis not present

## 2018-09-30 DIAGNOSIS — R2689 Other abnormalities of gait and mobility: Secondary | ICD-10-CM | POA: Diagnosis not present

## 2018-09-30 DIAGNOSIS — R2681 Unsteadiness on feet: Secondary | ICD-10-CM | POA: Diagnosis not present

## 2018-09-30 DIAGNOSIS — U071 COVID-19: Secondary | ICD-10-CM | POA: Diagnosis not present

## 2018-10-02 DIAGNOSIS — R2689 Other abnormalities of gait and mobility: Secondary | ICD-10-CM | POA: Diagnosis not present

## 2018-10-02 DIAGNOSIS — R2681 Unsteadiness on feet: Secondary | ICD-10-CM | POA: Diagnosis not present

## 2018-10-02 DIAGNOSIS — R296 Repeated falls: Secondary | ICD-10-CM | POA: Diagnosis not present

## 2018-10-02 DIAGNOSIS — Z9181 History of falling: Secondary | ICD-10-CM | POA: Diagnosis not present

## 2018-10-02 DIAGNOSIS — M6281 Muscle weakness (generalized): Secondary | ICD-10-CM | POA: Diagnosis not present

## 2018-10-02 DIAGNOSIS — U071 COVID-19: Secondary | ICD-10-CM | POA: Diagnosis not present

## 2018-10-03 DIAGNOSIS — R2681 Unsteadiness on feet: Secondary | ICD-10-CM | POA: Diagnosis not present

## 2018-10-03 DIAGNOSIS — Z9181 History of falling: Secondary | ICD-10-CM | POA: Diagnosis not present

## 2018-10-03 DIAGNOSIS — M6281 Muscle weakness (generalized): Secondary | ICD-10-CM | POA: Diagnosis not present

## 2018-10-03 DIAGNOSIS — R2689 Other abnormalities of gait and mobility: Secondary | ICD-10-CM | POA: Diagnosis not present

## 2018-10-03 DIAGNOSIS — U071 COVID-19: Secondary | ICD-10-CM | POA: Diagnosis not present

## 2018-10-03 DIAGNOSIS — R296 Repeated falls: Secondary | ICD-10-CM | POA: Diagnosis not present

## 2018-10-04 DIAGNOSIS — M6281 Muscle weakness (generalized): Secondary | ICD-10-CM | POA: Diagnosis not present

## 2018-10-04 DIAGNOSIS — U071 COVID-19: Secondary | ICD-10-CM | POA: Diagnosis not present

## 2018-10-04 DIAGNOSIS — I10 Essential (primary) hypertension: Secondary | ICD-10-CM | POA: Diagnosis not present

## 2018-10-04 DIAGNOSIS — F39 Unspecified mood [affective] disorder: Secondary | ICD-10-CM | POA: Diagnosis not present

## 2018-10-04 DIAGNOSIS — R296 Repeated falls: Secondary | ICD-10-CM | POA: Diagnosis not present

## 2018-10-04 DIAGNOSIS — R2689 Other abnormalities of gait and mobility: Secondary | ICD-10-CM | POA: Diagnosis not present

## 2018-10-04 DIAGNOSIS — D500 Iron deficiency anemia secondary to blood loss (chronic): Secondary | ICD-10-CM | POA: Diagnosis not present

## 2018-10-04 DIAGNOSIS — R2681 Unsteadiness on feet: Secondary | ICD-10-CM | POA: Diagnosis not present

## 2018-10-04 DIAGNOSIS — Z9181 History of falling: Secondary | ICD-10-CM | POA: Diagnosis not present

## 2018-10-04 DIAGNOSIS — J4530 Mild persistent asthma, uncomplicated: Secondary | ICD-10-CM | POA: Diagnosis not present

## 2018-10-04 DIAGNOSIS — K219 Gastro-esophageal reflux disease without esophagitis: Secondary | ICD-10-CM | POA: Diagnosis not present

## 2018-10-05 DIAGNOSIS — Z9181 History of falling: Secondary | ICD-10-CM | POA: Diagnosis not present

## 2018-10-05 DIAGNOSIS — R296 Repeated falls: Secondary | ICD-10-CM | POA: Diagnosis not present

## 2018-10-05 DIAGNOSIS — R2681 Unsteadiness on feet: Secondary | ICD-10-CM | POA: Diagnosis not present

## 2018-10-05 DIAGNOSIS — M6281 Muscle weakness (generalized): Secondary | ICD-10-CM | POA: Diagnosis not present

## 2018-10-05 DIAGNOSIS — R2689 Other abnormalities of gait and mobility: Secondary | ICD-10-CM | POA: Diagnosis not present

## 2018-10-05 DIAGNOSIS — U071 COVID-19: Secondary | ICD-10-CM | POA: Diagnosis not present

## 2018-10-06 DIAGNOSIS — R296 Repeated falls: Secondary | ICD-10-CM | POA: Diagnosis not present

## 2018-10-06 DIAGNOSIS — Z9181 History of falling: Secondary | ICD-10-CM | POA: Diagnosis not present

## 2018-10-06 DIAGNOSIS — U071 COVID-19: Secondary | ICD-10-CM | POA: Diagnosis not present

## 2018-10-06 DIAGNOSIS — R2681 Unsteadiness on feet: Secondary | ICD-10-CM | POA: Diagnosis not present

## 2018-10-06 DIAGNOSIS — M6281 Muscle weakness (generalized): Secondary | ICD-10-CM | POA: Diagnosis not present

## 2018-10-06 DIAGNOSIS — R2689 Other abnormalities of gait and mobility: Secondary | ICD-10-CM | POA: Diagnosis not present

## 2018-10-11 DIAGNOSIS — R296 Repeated falls: Secondary | ICD-10-CM | POA: Diagnosis not present

## 2018-10-11 DIAGNOSIS — R2689 Other abnormalities of gait and mobility: Secondary | ICD-10-CM | POA: Diagnosis not present

## 2018-10-11 DIAGNOSIS — U071 COVID-19: Secondary | ICD-10-CM | POA: Diagnosis not present

## 2018-10-11 DIAGNOSIS — Z9181 History of falling: Secondary | ICD-10-CM | POA: Diagnosis not present

## 2018-10-11 DIAGNOSIS — R2681 Unsteadiness on feet: Secondary | ICD-10-CM | POA: Diagnosis not present

## 2018-10-11 DIAGNOSIS — M6281 Muscle weakness (generalized): Secondary | ICD-10-CM | POA: Diagnosis not present

## 2018-10-12 DIAGNOSIS — M6281 Muscle weakness (generalized): Secondary | ICD-10-CM | POA: Diagnosis not present

## 2018-10-12 DIAGNOSIS — R2681 Unsteadiness on feet: Secondary | ICD-10-CM | POA: Diagnosis not present

## 2018-10-12 DIAGNOSIS — U071 COVID-19: Secondary | ICD-10-CM | POA: Diagnosis not present

## 2018-10-12 DIAGNOSIS — R2689 Other abnormalities of gait and mobility: Secondary | ICD-10-CM | POA: Diagnosis not present

## 2018-10-12 DIAGNOSIS — R296 Repeated falls: Secondary | ICD-10-CM | POA: Diagnosis not present

## 2018-10-12 DIAGNOSIS — Z9181 History of falling: Secondary | ICD-10-CM | POA: Diagnosis not present

## 2018-10-13 DIAGNOSIS — Z9181 History of falling: Secondary | ICD-10-CM | POA: Diagnosis not present

## 2018-10-13 DIAGNOSIS — R2689 Other abnormalities of gait and mobility: Secondary | ICD-10-CM | POA: Diagnosis not present

## 2018-10-13 DIAGNOSIS — U071 COVID-19: Secondary | ICD-10-CM | POA: Diagnosis not present

## 2018-10-13 DIAGNOSIS — R2681 Unsteadiness on feet: Secondary | ICD-10-CM | POA: Diagnosis not present

## 2018-10-13 DIAGNOSIS — R296 Repeated falls: Secondary | ICD-10-CM | POA: Diagnosis not present

## 2018-10-13 DIAGNOSIS — M6281 Muscle weakness (generalized): Secondary | ICD-10-CM | POA: Diagnosis not present

## 2018-10-18 DIAGNOSIS — M6281 Muscle weakness (generalized): Secondary | ICD-10-CM | POA: Diagnosis not present

## 2018-10-18 DIAGNOSIS — Z9181 History of falling: Secondary | ICD-10-CM | POA: Diagnosis not present

## 2018-10-18 DIAGNOSIS — R296 Repeated falls: Secondary | ICD-10-CM | POA: Diagnosis not present

## 2018-10-18 DIAGNOSIS — U071 COVID-19: Secondary | ICD-10-CM | POA: Diagnosis not present

## 2018-10-18 DIAGNOSIS — R2681 Unsteadiness on feet: Secondary | ICD-10-CM | POA: Diagnosis not present

## 2018-10-18 DIAGNOSIS — R2689 Other abnormalities of gait and mobility: Secondary | ICD-10-CM | POA: Diagnosis not present

## 2018-10-19 DIAGNOSIS — R2681 Unsteadiness on feet: Secondary | ICD-10-CM | POA: Diagnosis not present

## 2018-10-19 DIAGNOSIS — E669 Obesity, unspecified: Secondary | ICD-10-CM | POA: Diagnosis not present

## 2018-10-19 DIAGNOSIS — Z9181 History of falling: Secondary | ICD-10-CM | POA: Diagnosis not present

## 2018-10-19 DIAGNOSIS — R2689 Other abnormalities of gait and mobility: Secondary | ICD-10-CM | POA: Diagnosis not present

## 2018-10-19 DIAGNOSIS — R296 Repeated falls: Secondary | ICD-10-CM | POA: Diagnosis not present

## 2018-10-19 DIAGNOSIS — M6281 Muscle weakness (generalized): Secondary | ICD-10-CM | POA: Diagnosis not present

## 2018-10-19 DIAGNOSIS — U071 COVID-19: Secondary | ICD-10-CM | POA: Diagnosis not present

## 2018-10-19 DIAGNOSIS — I10 Essential (primary) hypertension: Secondary | ICD-10-CM | POA: Diagnosis not present

## 2018-10-19 DIAGNOSIS — L304 Erythema intertrigo: Secondary | ICD-10-CM | POA: Diagnosis not present

## 2018-10-20 DIAGNOSIS — R2681 Unsteadiness on feet: Secondary | ICD-10-CM | POA: Diagnosis not present

## 2018-10-20 DIAGNOSIS — R296 Repeated falls: Secondary | ICD-10-CM | POA: Diagnosis not present

## 2018-10-20 DIAGNOSIS — R2689 Other abnormalities of gait and mobility: Secondary | ICD-10-CM | POA: Diagnosis not present

## 2018-10-20 DIAGNOSIS — I739 Peripheral vascular disease, unspecified: Secondary | ICD-10-CM | POA: Diagnosis not present

## 2018-10-20 DIAGNOSIS — M6281 Muscle weakness (generalized): Secondary | ICD-10-CM | POA: Diagnosis not present

## 2018-10-20 DIAGNOSIS — B351 Tinea unguium: Secondary | ICD-10-CM | POA: Diagnosis not present

## 2018-10-20 DIAGNOSIS — U071 COVID-19: Secondary | ICD-10-CM | POA: Diagnosis not present

## 2018-10-20 DIAGNOSIS — Z9181 History of falling: Secondary | ICD-10-CM | POA: Diagnosis not present

## 2018-10-25 DIAGNOSIS — Z9181 History of falling: Secondary | ICD-10-CM | POA: Diagnosis not present

## 2018-10-25 DIAGNOSIS — M6281 Muscle weakness (generalized): Secondary | ICD-10-CM | POA: Diagnosis not present

## 2018-10-25 DIAGNOSIS — R2689 Other abnormalities of gait and mobility: Secondary | ICD-10-CM | POA: Diagnosis not present

## 2018-10-25 DIAGNOSIS — R2681 Unsteadiness on feet: Secondary | ICD-10-CM | POA: Diagnosis not present

## 2018-10-25 DIAGNOSIS — J449 Chronic obstructive pulmonary disease, unspecified: Secondary | ICD-10-CM | POA: Diagnosis not present

## 2018-10-25 DIAGNOSIS — R278 Other lack of coordination: Secondary | ICD-10-CM | POA: Diagnosis not present

## 2018-10-25 DIAGNOSIS — R296 Repeated falls: Secondary | ICD-10-CM | POA: Diagnosis not present

## 2018-10-26 DIAGNOSIS — J449 Chronic obstructive pulmonary disease, unspecified: Secondary | ICD-10-CM | POA: Diagnosis not present

## 2018-10-26 DIAGNOSIS — R2689 Other abnormalities of gait and mobility: Secondary | ICD-10-CM | POA: Diagnosis not present

## 2018-10-26 DIAGNOSIS — R2681 Unsteadiness on feet: Secondary | ICD-10-CM | POA: Diagnosis not present

## 2018-10-26 DIAGNOSIS — M6281 Muscle weakness (generalized): Secondary | ICD-10-CM | POA: Diagnosis not present

## 2018-10-26 DIAGNOSIS — R296 Repeated falls: Secondary | ICD-10-CM | POA: Diagnosis not present

## 2018-10-26 DIAGNOSIS — Z9181 History of falling: Secondary | ICD-10-CM | POA: Diagnosis not present

## 2018-10-27 DIAGNOSIS — R2681 Unsteadiness on feet: Secondary | ICD-10-CM | POA: Diagnosis not present

## 2018-10-27 DIAGNOSIS — M6281 Muscle weakness (generalized): Secondary | ICD-10-CM | POA: Diagnosis not present

## 2018-10-27 DIAGNOSIS — Z9181 History of falling: Secondary | ICD-10-CM | POA: Diagnosis not present

## 2018-10-27 DIAGNOSIS — R296 Repeated falls: Secondary | ICD-10-CM | POA: Diagnosis not present

## 2018-10-27 DIAGNOSIS — J449 Chronic obstructive pulmonary disease, unspecified: Secondary | ICD-10-CM | POA: Diagnosis not present

## 2018-10-27 DIAGNOSIS — R2689 Other abnormalities of gait and mobility: Secondary | ICD-10-CM | POA: Diagnosis not present

## 2018-11-01 DIAGNOSIS — Z9181 History of falling: Secondary | ICD-10-CM | POA: Diagnosis not present

## 2018-11-01 DIAGNOSIS — R2689 Other abnormalities of gait and mobility: Secondary | ICD-10-CM | POA: Diagnosis not present

## 2018-11-01 DIAGNOSIS — J449 Chronic obstructive pulmonary disease, unspecified: Secondary | ICD-10-CM | POA: Diagnosis not present

## 2018-11-01 DIAGNOSIS — R296 Repeated falls: Secondary | ICD-10-CM | POA: Diagnosis not present

## 2018-11-01 DIAGNOSIS — R2681 Unsteadiness on feet: Secondary | ICD-10-CM | POA: Diagnosis not present

## 2018-11-01 DIAGNOSIS — M6281 Muscle weakness (generalized): Secondary | ICD-10-CM | POA: Diagnosis not present

## 2018-11-02 DIAGNOSIS — J449 Chronic obstructive pulmonary disease, unspecified: Secondary | ICD-10-CM | POA: Diagnosis not present

## 2018-11-02 DIAGNOSIS — R2681 Unsteadiness on feet: Secondary | ICD-10-CM | POA: Diagnosis not present

## 2018-11-02 DIAGNOSIS — R2689 Other abnormalities of gait and mobility: Secondary | ICD-10-CM | POA: Diagnosis not present

## 2018-11-02 DIAGNOSIS — R296 Repeated falls: Secondary | ICD-10-CM | POA: Diagnosis not present

## 2018-11-02 DIAGNOSIS — Z9181 History of falling: Secondary | ICD-10-CM | POA: Diagnosis not present

## 2018-11-02 DIAGNOSIS — M6281 Muscle weakness (generalized): Secondary | ICD-10-CM | POA: Diagnosis not present

## 2018-11-03 DIAGNOSIS — R296 Repeated falls: Secondary | ICD-10-CM | POA: Diagnosis not present

## 2018-11-03 DIAGNOSIS — R2689 Other abnormalities of gait and mobility: Secondary | ICD-10-CM | POA: Diagnosis not present

## 2018-11-03 DIAGNOSIS — M6281 Muscle weakness (generalized): Secondary | ICD-10-CM | POA: Diagnosis not present

## 2018-11-03 DIAGNOSIS — R2681 Unsteadiness on feet: Secondary | ICD-10-CM | POA: Diagnosis not present

## 2018-11-03 DIAGNOSIS — J449 Chronic obstructive pulmonary disease, unspecified: Secondary | ICD-10-CM | POA: Diagnosis not present

## 2018-11-03 DIAGNOSIS — Z9181 History of falling: Secondary | ICD-10-CM | POA: Diagnosis not present

## 2018-11-08 DIAGNOSIS — G478 Other sleep disorders: Secondary | ICD-10-CM | POA: Diagnosis not present

## 2018-11-08 DIAGNOSIS — R2689 Other abnormalities of gait and mobility: Secondary | ICD-10-CM | POA: Diagnosis not present

## 2018-11-08 DIAGNOSIS — R296 Repeated falls: Secondary | ICD-10-CM | POA: Diagnosis not present

## 2018-11-08 DIAGNOSIS — M6281 Muscle weakness (generalized): Secondary | ICD-10-CM | POA: Diagnosis not present

## 2018-11-08 DIAGNOSIS — F3489 Other specified persistent mood disorders: Secondary | ICD-10-CM | POA: Diagnosis not present

## 2018-11-08 DIAGNOSIS — I10 Essential (primary) hypertension: Secondary | ICD-10-CM | POA: Diagnosis not present

## 2018-11-08 DIAGNOSIS — J449 Chronic obstructive pulmonary disease, unspecified: Secondary | ICD-10-CM | POA: Diagnosis not present

## 2018-11-08 DIAGNOSIS — Z9181 History of falling: Secondary | ICD-10-CM | POA: Diagnosis not present

## 2018-11-08 DIAGNOSIS — R2681 Unsteadiness on feet: Secondary | ICD-10-CM | POA: Diagnosis not present

## 2018-11-09 DIAGNOSIS — M6281 Muscle weakness (generalized): Secondary | ICD-10-CM | POA: Diagnosis not present

## 2018-11-09 DIAGNOSIS — Z9181 History of falling: Secondary | ICD-10-CM | POA: Diagnosis not present

## 2018-11-09 DIAGNOSIS — R2681 Unsteadiness on feet: Secondary | ICD-10-CM | POA: Diagnosis not present

## 2018-11-09 DIAGNOSIS — R296 Repeated falls: Secondary | ICD-10-CM | POA: Diagnosis not present

## 2018-11-09 DIAGNOSIS — R2689 Other abnormalities of gait and mobility: Secondary | ICD-10-CM | POA: Diagnosis not present

## 2018-11-09 DIAGNOSIS — J449 Chronic obstructive pulmonary disease, unspecified: Secondary | ICD-10-CM | POA: Diagnosis not present

## 2018-11-11 DIAGNOSIS — M6281 Muscle weakness (generalized): Secondary | ICD-10-CM | POA: Diagnosis not present

## 2018-11-11 DIAGNOSIS — R2689 Other abnormalities of gait and mobility: Secondary | ICD-10-CM | POA: Diagnosis not present

## 2018-11-11 DIAGNOSIS — R2681 Unsteadiness on feet: Secondary | ICD-10-CM | POA: Diagnosis not present

## 2018-11-11 DIAGNOSIS — J449 Chronic obstructive pulmonary disease, unspecified: Secondary | ICD-10-CM | POA: Diagnosis not present

## 2018-11-11 DIAGNOSIS — Z9181 History of falling: Secondary | ICD-10-CM | POA: Diagnosis not present

## 2018-11-11 DIAGNOSIS — R296 Repeated falls: Secondary | ICD-10-CM | POA: Diagnosis not present

## 2018-11-17 DIAGNOSIS — J449 Chronic obstructive pulmonary disease, unspecified: Secondary | ICD-10-CM | POA: Diagnosis not present

## 2018-11-17 DIAGNOSIS — M6281 Muscle weakness (generalized): Secondary | ICD-10-CM | POA: Diagnosis not present

## 2018-11-17 DIAGNOSIS — R2689 Other abnormalities of gait and mobility: Secondary | ICD-10-CM | POA: Diagnosis not present

## 2018-11-17 DIAGNOSIS — Z9181 History of falling: Secondary | ICD-10-CM | POA: Diagnosis not present

## 2018-11-17 DIAGNOSIS — R2681 Unsteadiness on feet: Secondary | ICD-10-CM | POA: Diagnosis not present

## 2018-11-17 DIAGNOSIS — R296 Repeated falls: Secondary | ICD-10-CM | POA: Diagnosis not present

## 2018-11-19 DIAGNOSIS — R2681 Unsteadiness on feet: Secondary | ICD-10-CM | POA: Diagnosis not present

## 2018-11-19 DIAGNOSIS — R2689 Other abnormalities of gait and mobility: Secondary | ICD-10-CM | POA: Diagnosis not present

## 2018-11-19 DIAGNOSIS — Z9181 History of falling: Secondary | ICD-10-CM | POA: Diagnosis not present

## 2018-11-19 DIAGNOSIS — J449 Chronic obstructive pulmonary disease, unspecified: Secondary | ICD-10-CM | POA: Diagnosis not present

## 2018-11-19 DIAGNOSIS — M6281 Muscle weakness (generalized): Secondary | ICD-10-CM | POA: Diagnosis not present

## 2018-11-19 DIAGNOSIS — R296 Repeated falls: Secondary | ICD-10-CM | POA: Diagnosis not present

## 2018-11-20 DIAGNOSIS — R296 Repeated falls: Secondary | ICD-10-CM | POA: Diagnosis not present

## 2018-11-20 DIAGNOSIS — R2681 Unsteadiness on feet: Secondary | ICD-10-CM | POA: Diagnosis not present

## 2018-11-20 DIAGNOSIS — M6281 Muscle weakness (generalized): Secondary | ICD-10-CM | POA: Diagnosis not present

## 2018-11-20 DIAGNOSIS — Z9181 History of falling: Secondary | ICD-10-CM | POA: Diagnosis not present

## 2018-11-20 DIAGNOSIS — R2689 Other abnormalities of gait and mobility: Secondary | ICD-10-CM | POA: Diagnosis not present

## 2018-11-20 DIAGNOSIS — J449 Chronic obstructive pulmonary disease, unspecified: Secondary | ICD-10-CM | POA: Diagnosis not present

## 2018-11-22 DIAGNOSIS — J449 Chronic obstructive pulmonary disease, unspecified: Secondary | ICD-10-CM | POA: Diagnosis not present

## 2018-11-22 DIAGNOSIS — Z9181 History of falling: Secondary | ICD-10-CM | POA: Diagnosis not present

## 2018-11-22 DIAGNOSIS — M6281 Muscle weakness (generalized): Secondary | ICD-10-CM | POA: Diagnosis not present

## 2018-11-22 DIAGNOSIS — R2689 Other abnormalities of gait and mobility: Secondary | ICD-10-CM | POA: Diagnosis not present

## 2018-11-22 DIAGNOSIS — R296 Repeated falls: Secondary | ICD-10-CM | POA: Diagnosis not present

## 2018-11-22 DIAGNOSIS — R2681 Unsteadiness on feet: Secondary | ICD-10-CM | POA: Diagnosis not present

## 2018-11-23 DIAGNOSIS — M6281 Muscle weakness (generalized): Secondary | ICD-10-CM | POA: Diagnosis not present

## 2018-11-23 DIAGNOSIS — J449 Chronic obstructive pulmonary disease, unspecified: Secondary | ICD-10-CM | POA: Diagnosis not present

## 2018-11-23 DIAGNOSIS — Z9181 History of falling: Secondary | ICD-10-CM | POA: Diagnosis not present

## 2018-11-23 DIAGNOSIS — R2689 Other abnormalities of gait and mobility: Secondary | ICD-10-CM | POA: Diagnosis not present

## 2018-11-23 DIAGNOSIS — R296 Repeated falls: Secondary | ICD-10-CM | POA: Diagnosis not present

## 2018-11-23 DIAGNOSIS — R2681 Unsteadiness on feet: Secondary | ICD-10-CM | POA: Diagnosis not present

## 2018-11-24 DIAGNOSIS — K219 Gastro-esophageal reflux disease without esophagitis: Secondary | ICD-10-CM | POA: Diagnosis not present

## 2018-11-24 DIAGNOSIS — R2681 Unsteadiness on feet: Secondary | ICD-10-CM | POA: Diagnosis not present

## 2018-11-24 DIAGNOSIS — J189 Pneumonia, unspecified organism: Secondary | ICD-10-CM | POA: Diagnosis not present

## 2018-11-24 DIAGNOSIS — R296 Repeated falls: Secondary | ICD-10-CM | POA: Diagnosis not present

## 2018-11-24 DIAGNOSIS — U071 COVID-19: Secondary | ICD-10-CM | POA: Diagnosis not present

## 2018-11-24 DIAGNOSIS — J449 Chronic obstructive pulmonary disease, unspecified: Secondary | ICD-10-CM | POA: Diagnosis not present

## 2018-11-24 DIAGNOSIS — R2689 Other abnormalities of gait and mobility: Secondary | ICD-10-CM | POA: Diagnosis not present

## 2018-11-24 DIAGNOSIS — Z9181 History of falling: Secondary | ICD-10-CM | POA: Diagnosis not present

## 2018-11-24 DIAGNOSIS — R278 Other lack of coordination: Secondary | ICD-10-CM | POA: Diagnosis not present

## 2018-11-24 DIAGNOSIS — M6281 Muscle weakness (generalized): Secondary | ICD-10-CM | POA: Diagnosis not present

## 2018-11-24 DIAGNOSIS — R1312 Dysphagia, oropharyngeal phase: Secondary | ICD-10-CM | POA: Diagnosis not present

## 2018-11-28 DIAGNOSIS — M6281 Muscle weakness (generalized): Secondary | ICD-10-CM | POA: Diagnosis not present

## 2018-11-28 DIAGNOSIS — R2681 Unsteadiness on feet: Secondary | ICD-10-CM | POA: Diagnosis not present

## 2018-11-28 DIAGNOSIS — J449 Chronic obstructive pulmonary disease, unspecified: Secondary | ICD-10-CM | POA: Diagnosis not present

## 2018-11-28 DIAGNOSIS — R2689 Other abnormalities of gait and mobility: Secondary | ICD-10-CM | POA: Diagnosis not present

## 2018-11-28 DIAGNOSIS — R296 Repeated falls: Secondary | ICD-10-CM | POA: Diagnosis not present

## 2018-11-28 DIAGNOSIS — Z9181 History of falling: Secondary | ICD-10-CM | POA: Diagnosis not present

## 2018-11-29 DIAGNOSIS — U071 COVID-19: Secondary | ICD-10-CM | POA: Diagnosis not present

## 2018-11-30 DIAGNOSIS — Z23 Encounter for immunization: Secondary | ICD-10-CM | POA: Diagnosis not present

## 2018-12-01 DIAGNOSIS — Z9181 History of falling: Secondary | ICD-10-CM | POA: Diagnosis not present

## 2018-12-01 DIAGNOSIS — R296 Repeated falls: Secondary | ICD-10-CM | POA: Diagnosis not present

## 2018-12-01 DIAGNOSIS — R2681 Unsteadiness on feet: Secondary | ICD-10-CM | POA: Diagnosis not present

## 2018-12-01 DIAGNOSIS — R2689 Other abnormalities of gait and mobility: Secondary | ICD-10-CM | POA: Diagnosis not present

## 2018-12-01 DIAGNOSIS — M6281 Muscle weakness (generalized): Secondary | ICD-10-CM | POA: Diagnosis not present

## 2018-12-01 DIAGNOSIS — J449 Chronic obstructive pulmonary disease, unspecified: Secondary | ICD-10-CM | POA: Diagnosis not present

## 2018-12-03 DIAGNOSIS — R2689 Other abnormalities of gait and mobility: Secondary | ICD-10-CM | POA: Diagnosis not present

## 2018-12-03 DIAGNOSIS — M6281 Muscle weakness (generalized): Secondary | ICD-10-CM | POA: Diagnosis not present

## 2018-12-03 DIAGNOSIS — J449 Chronic obstructive pulmonary disease, unspecified: Secondary | ICD-10-CM | POA: Diagnosis not present

## 2018-12-03 DIAGNOSIS — Z9181 History of falling: Secondary | ICD-10-CM | POA: Diagnosis not present

## 2018-12-03 DIAGNOSIS — R2681 Unsteadiness on feet: Secondary | ICD-10-CM | POA: Diagnosis not present

## 2018-12-03 DIAGNOSIS — R296 Repeated falls: Secondary | ICD-10-CM | POA: Diagnosis not present

## 2018-12-06 DIAGNOSIS — U071 COVID-19: Secondary | ICD-10-CM | POA: Diagnosis not present

## 2018-12-06 DIAGNOSIS — F39 Unspecified mood [affective] disorder: Secondary | ICD-10-CM | POA: Diagnosis not present

## 2018-12-06 DIAGNOSIS — J45909 Unspecified asthma, uncomplicated: Secondary | ICD-10-CM | POA: Diagnosis not present

## 2018-12-06 DIAGNOSIS — K5901 Slow transit constipation: Secondary | ICD-10-CM | POA: Diagnosis not present

## 2018-12-06 DIAGNOSIS — R269 Unspecified abnormalities of gait and mobility: Secondary | ICD-10-CM | POA: Diagnosis not present

## 2018-12-08 DIAGNOSIS — J449 Chronic obstructive pulmonary disease, unspecified: Secondary | ICD-10-CM | POA: Diagnosis not present

## 2018-12-08 DIAGNOSIS — Z9181 History of falling: Secondary | ICD-10-CM | POA: Diagnosis not present

## 2018-12-08 DIAGNOSIS — R2681 Unsteadiness on feet: Secondary | ICD-10-CM | POA: Diagnosis not present

## 2018-12-08 DIAGNOSIS — R2689 Other abnormalities of gait and mobility: Secondary | ICD-10-CM | POA: Diagnosis not present

## 2018-12-08 DIAGNOSIS — M6281 Muscle weakness (generalized): Secondary | ICD-10-CM | POA: Diagnosis not present

## 2018-12-08 DIAGNOSIS — R296 Repeated falls: Secondary | ICD-10-CM | POA: Diagnosis not present

## 2018-12-09 DIAGNOSIS — J449 Chronic obstructive pulmonary disease, unspecified: Secondary | ICD-10-CM | POA: Diagnosis not present

## 2018-12-09 DIAGNOSIS — Z9181 History of falling: Secondary | ICD-10-CM | POA: Diagnosis not present

## 2018-12-09 DIAGNOSIS — R296 Repeated falls: Secondary | ICD-10-CM | POA: Diagnosis not present

## 2018-12-09 DIAGNOSIS — M6281 Muscle weakness (generalized): Secondary | ICD-10-CM | POA: Diagnosis not present

## 2018-12-09 DIAGNOSIS — R2681 Unsteadiness on feet: Secondary | ICD-10-CM | POA: Diagnosis not present

## 2018-12-09 DIAGNOSIS — R2689 Other abnormalities of gait and mobility: Secondary | ICD-10-CM | POA: Diagnosis not present

## 2018-12-11 DIAGNOSIS — R296 Repeated falls: Secondary | ICD-10-CM | POA: Diagnosis not present

## 2018-12-11 DIAGNOSIS — Z9181 History of falling: Secondary | ICD-10-CM | POA: Diagnosis not present

## 2018-12-11 DIAGNOSIS — R2681 Unsteadiness on feet: Secondary | ICD-10-CM | POA: Diagnosis not present

## 2018-12-11 DIAGNOSIS — J449 Chronic obstructive pulmonary disease, unspecified: Secondary | ICD-10-CM | POA: Diagnosis not present

## 2018-12-11 DIAGNOSIS — R2689 Other abnormalities of gait and mobility: Secondary | ICD-10-CM | POA: Diagnosis not present

## 2018-12-11 DIAGNOSIS — M6281 Muscle weakness (generalized): Secondary | ICD-10-CM | POA: Diagnosis not present

## 2018-12-14 DIAGNOSIS — R296 Repeated falls: Secondary | ICD-10-CM | POA: Diagnosis not present

## 2018-12-14 DIAGNOSIS — M6281 Muscle weakness (generalized): Secondary | ICD-10-CM | POA: Diagnosis not present

## 2018-12-14 DIAGNOSIS — J449 Chronic obstructive pulmonary disease, unspecified: Secondary | ICD-10-CM | POA: Diagnosis not present

## 2018-12-14 DIAGNOSIS — U071 COVID-19: Secondary | ICD-10-CM | POA: Diagnosis not present

## 2018-12-14 DIAGNOSIS — Z9181 History of falling: Secondary | ICD-10-CM | POA: Diagnosis not present

## 2018-12-14 DIAGNOSIS — R2681 Unsteadiness on feet: Secondary | ICD-10-CM | POA: Diagnosis not present

## 2018-12-14 DIAGNOSIS — R2689 Other abnormalities of gait and mobility: Secondary | ICD-10-CM | POA: Diagnosis not present

## 2018-12-15 DIAGNOSIS — J449 Chronic obstructive pulmonary disease, unspecified: Secondary | ICD-10-CM | POA: Diagnosis not present

## 2018-12-15 DIAGNOSIS — R2689 Other abnormalities of gait and mobility: Secondary | ICD-10-CM | POA: Diagnosis not present

## 2018-12-15 DIAGNOSIS — R296 Repeated falls: Secondary | ICD-10-CM | POA: Diagnosis not present

## 2018-12-15 DIAGNOSIS — M6281 Muscle weakness (generalized): Secondary | ICD-10-CM | POA: Diagnosis not present

## 2018-12-15 DIAGNOSIS — Z9181 History of falling: Secondary | ICD-10-CM | POA: Diagnosis not present

## 2018-12-15 DIAGNOSIS — R2681 Unsteadiness on feet: Secondary | ICD-10-CM | POA: Diagnosis not present

## 2018-12-16 DIAGNOSIS — J449 Chronic obstructive pulmonary disease, unspecified: Secondary | ICD-10-CM | POA: Diagnosis not present

## 2018-12-16 DIAGNOSIS — R2681 Unsteadiness on feet: Secondary | ICD-10-CM | POA: Diagnosis not present

## 2018-12-16 DIAGNOSIS — Z9181 History of falling: Secondary | ICD-10-CM | POA: Diagnosis not present

## 2018-12-16 DIAGNOSIS — M6281 Muscle weakness (generalized): Secondary | ICD-10-CM | POA: Diagnosis not present

## 2018-12-16 DIAGNOSIS — R296 Repeated falls: Secondary | ICD-10-CM | POA: Diagnosis not present

## 2018-12-16 DIAGNOSIS — R2689 Other abnormalities of gait and mobility: Secondary | ICD-10-CM | POA: Diagnosis not present

## 2018-12-19 DIAGNOSIS — R2681 Unsteadiness on feet: Secondary | ICD-10-CM | POA: Diagnosis not present

## 2018-12-19 DIAGNOSIS — R296 Repeated falls: Secondary | ICD-10-CM | POA: Diagnosis not present

## 2018-12-19 DIAGNOSIS — R2689 Other abnormalities of gait and mobility: Secondary | ICD-10-CM | POA: Diagnosis not present

## 2018-12-19 DIAGNOSIS — Z9181 History of falling: Secondary | ICD-10-CM | POA: Diagnosis not present

## 2018-12-19 DIAGNOSIS — J449 Chronic obstructive pulmonary disease, unspecified: Secondary | ICD-10-CM | POA: Diagnosis not present

## 2018-12-19 DIAGNOSIS — M6281 Muscle weakness (generalized): Secondary | ICD-10-CM | POA: Diagnosis not present

## 2018-12-20 DIAGNOSIS — R2681 Unsteadiness on feet: Secondary | ICD-10-CM | POA: Diagnosis not present

## 2018-12-20 DIAGNOSIS — R296 Repeated falls: Secondary | ICD-10-CM | POA: Diagnosis not present

## 2018-12-20 DIAGNOSIS — R2689 Other abnormalities of gait and mobility: Secondary | ICD-10-CM | POA: Diagnosis not present

## 2018-12-20 DIAGNOSIS — Z9181 History of falling: Secondary | ICD-10-CM | POA: Diagnosis not present

## 2018-12-20 DIAGNOSIS — M6281 Muscle weakness (generalized): Secondary | ICD-10-CM | POA: Diagnosis not present

## 2018-12-20 DIAGNOSIS — J449 Chronic obstructive pulmonary disease, unspecified: Secondary | ICD-10-CM | POA: Diagnosis not present

## 2018-12-20 DIAGNOSIS — U071 COVID-19: Secondary | ICD-10-CM | POA: Diagnosis not present

## 2018-12-21 DIAGNOSIS — M6281 Muscle weakness (generalized): Secondary | ICD-10-CM | POA: Diagnosis not present

## 2018-12-21 DIAGNOSIS — R2689 Other abnormalities of gait and mobility: Secondary | ICD-10-CM | POA: Diagnosis not present

## 2018-12-21 DIAGNOSIS — Z9181 History of falling: Secondary | ICD-10-CM | POA: Diagnosis not present

## 2018-12-21 DIAGNOSIS — R2681 Unsteadiness on feet: Secondary | ICD-10-CM | POA: Diagnosis not present

## 2018-12-21 DIAGNOSIS — J449 Chronic obstructive pulmonary disease, unspecified: Secondary | ICD-10-CM | POA: Diagnosis not present

## 2018-12-21 DIAGNOSIS — R296 Repeated falls: Secondary | ICD-10-CM | POA: Diagnosis not present

## 2018-12-22 DIAGNOSIS — J449 Chronic obstructive pulmonary disease, unspecified: Secondary | ICD-10-CM | POA: Diagnosis not present

## 2018-12-22 DIAGNOSIS — R2689 Other abnormalities of gait and mobility: Secondary | ICD-10-CM | POA: Diagnosis not present

## 2018-12-22 DIAGNOSIS — M6281 Muscle weakness (generalized): Secondary | ICD-10-CM | POA: Diagnosis not present

## 2018-12-22 DIAGNOSIS — Z9181 History of falling: Secondary | ICD-10-CM | POA: Diagnosis not present

## 2018-12-22 DIAGNOSIS — R296 Repeated falls: Secondary | ICD-10-CM | POA: Diagnosis not present

## 2018-12-22 DIAGNOSIS — R2681 Unsteadiness on feet: Secondary | ICD-10-CM | POA: Diagnosis not present

## 2018-12-23 DIAGNOSIS — J449 Chronic obstructive pulmonary disease, unspecified: Secondary | ICD-10-CM | POA: Diagnosis not present

## 2018-12-23 DIAGNOSIS — Z9181 History of falling: Secondary | ICD-10-CM | POA: Diagnosis not present

## 2018-12-23 DIAGNOSIS — M6281 Muscle weakness (generalized): Secondary | ICD-10-CM | POA: Diagnosis not present

## 2018-12-23 DIAGNOSIS — R2689 Other abnormalities of gait and mobility: Secondary | ICD-10-CM | POA: Diagnosis not present

## 2018-12-23 DIAGNOSIS — R2681 Unsteadiness on feet: Secondary | ICD-10-CM | POA: Diagnosis not present

## 2018-12-23 DIAGNOSIS — R296 Repeated falls: Secondary | ICD-10-CM | POA: Diagnosis not present

## 2018-12-24 DIAGNOSIS — R2681 Unsteadiness on feet: Secondary | ICD-10-CM | POA: Diagnosis not present

## 2018-12-24 DIAGNOSIS — R296 Repeated falls: Secondary | ICD-10-CM | POA: Diagnosis not present

## 2018-12-24 DIAGNOSIS — Z9181 History of falling: Secondary | ICD-10-CM | POA: Diagnosis not present

## 2018-12-24 DIAGNOSIS — R2689 Other abnormalities of gait and mobility: Secondary | ICD-10-CM | POA: Diagnosis not present

## 2018-12-24 DIAGNOSIS — M6281 Muscle weakness (generalized): Secondary | ICD-10-CM | POA: Diagnosis not present

## 2018-12-24 DIAGNOSIS — J449 Chronic obstructive pulmonary disease, unspecified: Secondary | ICD-10-CM | POA: Diagnosis not present

## 2018-12-27 DIAGNOSIS — R2681 Unsteadiness on feet: Secondary | ICD-10-CM | POA: Diagnosis not present

## 2018-12-27 DIAGNOSIS — L600 Ingrowing nail: Secondary | ICD-10-CM | POA: Diagnosis not present

## 2018-12-27 DIAGNOSIS — R278 Other lack of coordination: Secondary | ICD-10-CM | POA: Diagnosis not present

## 2018-12-27 DIAGNOSIS — I739 Peripheral vascular disease, unspecified: Secondary | ICD-10-CM | POA: Diagnosis not present

## 2018-12-27 DIAGNOSIS — Z9181 History of falling: Secondary | ICD-10-CM | POA: Diagnosis not present

## 2018-12-27 DIAGNOSIS — R1312 Dysphagia, oropharyngeal phase: Secondary | ICD-10-CM | POA: Diagnosis not present

## 2018-12-27 DIAGNOSIS — M6281 Muscle weakness (generalized): Secondary | ICD-10-CM | POA: Diagnosis not present

## 2018-12-27 DIAGNOSIS — U071 COVID-19: Secondary | ICD-10-CM | POA: Diagnosis not present

## 2018-12-27 DIAGNOSIS — J449 Chronic obstructive pulmonary disease, unspecified: Secondary | ICD-10-CM | POA: Diagnosis not present

## 2018-12-27 DIAGNOSIS — R296 Repeated falls: Secondary | ICD-10-CM | POA: Diagnosis not present

## 2018-12-27 DIAGNOSIS — L602 Onychogryphosis: Secondary | ICD-10-CM | POA: Diagnosis not present

## 2018-12-27 DIAGNOSIS — R2689 Other abnormalities of gait and mobility: Secondary | ICD-10-CM | POA: Diagnosis not present

## 2018-12-28 DIAGNOSIS — R2681 Unsteadiness on feet: Secondary | ICD-10-CM | POA: Diagnosis not present

## 2018-12-28 DIAGNOSIS — R296 Repeated falls: Secondary | ICD-10-CM | POA: Diagnosis not present

## 2018-12-28 DIAGNOSIS — R2689 Other abnormalities of gait and mobility: Secondary | ICD-10-CM | POA: Diagnosis not present

## 2018-12-28 DIAGNOSIS — M6281 Muscle weakness (generalized): Secondary | ICD-10-CM | POA: Diagnosis not present

## 2018-12-28 DIAGNOSIS — Z9181 History of falling: Secondary | ICD-10-CM | POA: Diagnosis not present

## 2018-12-28 DIAGNOSIS — J449 Chronic obstructive pulmonary disease, unspecified: Secondary | ICD-10-CM | POA: Diagnosis not present

## 2018-12-29 DIAGNOSIS — R2689 Other abnormalities of gait and mobility: Secondary | ICD-10-CM | POA: Diagnosis not present

## 2018-12-29 DIAGNOSIS — Z9181 History of falling: Secondary | ICD-10-CM | POA: Diagnosis not present

## 2018-12-29 DIAGNOSIS — R296 Repeated falls: Secondary | ICD-10-CM | POA: Diagnosis not present

## 2018-12-29 DIAGNOSIS — M6281 Muscle weakness (generalized): Secondary | ICD-10-CM | POA: Diagnosis not present

## 2018-12-29 DIAGNOSIS — J449 Chronic obstructive pulmonary disease, unspecified: Secondary | ICD-10-CM | POA: Diagnosis not present

## 2018-12-29 DIAGNOSIS — R2681 Unsteadiness on feet: Secondary | ICD-10-CM | POA: Diagnosis not present

## 2018-12-30 DIAGNOSIS — M6281 Muscle weakness (generalized): Secondary | ICD-10-CM | POA: Diagnosis not present

## 2018-12-30 DIAGNOSIS — Z9181 History of falling: Secondary | ICD-10-CM | POA: Diagnosis not present

## 2018-12-30 DIAGNOSIS — R2681 Unsteadiness on feet: Secondary | ICD-10-CM | POA: Diagnosis not present

## 2018-12-30 DIAGNOSIS — R2689 Other abnormalities of gait and mobility: Secondary | ICD-10-CM | POA: Diagnosis not present

## 2018-12-30 DIAGNOSIS — R296 Repeated falls: Secondary | ICD-10-CM | POA: Diagnosis not present

## 2018-12-30 DIAGNOSIS — J449 Chronic obstructive pulmonary disease, unspecified: Secondary | ICD-10-CM | POA: Diagnosis not present

## 2019-01-02 DIAGNOSIS — R296 Repeated falls: Secondary | ICD-10-CM | POA: Diagnosis not present

## 2019-01-02 DIAGNOSIS — J449 Chronic obstructive pulmonary disease, unspecified: Secondary | ICD-10-CM | POA: Diagnosis not present

## 2019-01-02 DIAGNOSIS — Z9181 History of falling: Secondary | ICD-10-CM | POA: Diagnosis not present

## 2019-01-02 DIAGNOSIS — R2681 Unsteadiness on feet: Secondary | ICD-10-CM | POA: Diagnosis not present

## 2019-01-02 DIAGNOSIS — R2689 Other abnormalities of gait and mobility: Secondary | ICD-10-CM | POA: Diagnosis not present

## 2019-01-02 DIAGNOSIS — M6281 Muscle weakness (generalized): Secondary | ICD-10-CM | POA: Diagnosis not present

## 2019-01-03 DIAGNOSIS — Z91018 Allergy to other foods: Secondary | ICD-10-CM | POA: Diagnosis not present

## 2019-01-03 DIAGNOSIS — I10 Essential (primary) hypertension: Secondary | ICD-10-CM | POA: Diagnosis not present

## 2019-01-03 DIAGNOSIS — J449 Chronic obstructive pulmonary disease, unspecified: Secondary | ICD-10-CM | POA: Diagnosis not present

## 2019-01-03 DIAGNOSIS — R2689 Other abnormalities of gait and mobility: Secondary | ICD-10-CM | POA: Diagnosis not present

## 2019-01-03 DIAGNOSIS — R296 Repeated falls: Secondary | ICD-10-CM | POA: Diagnosis not present

## 2019-01-03 DIAGNOSIS — R2681 Unsteadiness on feet: Secondary | ICD-10-CM | POA: Diagnosis not present

## 2019-01-03 DIAGNOSIS — M6281 Muscle weakness (generalized): Secondary | ICD-10-CM | POA: Diagnosis not present

## 2019-01-03 DIAGNOSIS — Z9181 History of falling: Secondary | ICD-10-CM | POA: Diagnosis not present

## 2019-01-03 DIAGNOSIS — T783XXA Angioneurotic edema, initial encounter: Secondary | ICD-10-CM | POA: Diagnosis not present

## 2019-01-03 DIAGNOSIS — U071 COVID-19: Secondary | ICD-10-CM | POA: Diagnosis not present

## 2019-01-04 DIAGNOSIS — R296 Repeated falls: Secondary | ICD-10-CM | POA: Diagnosis not present

## 2019-01-04 DIAGNOSIS — Z9181 History of falling: Secondary | ICD-10-CM | POA: Diagnosis not present

## 2019-01-04 DIAGNOSIS — J449 Chronic obstructive pulmonary disease, unspecified: Secondary | ICD-10-CM | POA: Diagnosis not present

## 2019-01-04 DIAGNOSIS — R2681 Unsteadiness on feet: Secondary | ICD-10-CM | POA: Diagnosis not present

## 2019-01-04 DIAGNOSIS — R2689 Other abnormalities of gait and mobility: Secondary | ICD-10-CM | POA: Diagnosis not present

## 2019-01-04 DIAGNOSIS — M6281 Muscle weakness (generalized): Secondary | ICD-10-CM | POA: Diagnosis not present

## 2019-01-05 DIAGNOSIS — R2681 Unsteadiness on feet: Secondary | ICD-10-CM | POA: Diagnosis not present

## 2019-01-05 DIAGNOSIS — J449 Chronic obstructive pulmonary disease, unspecified: Secondary | ICD-10-CM | POA: Diagnosis not present

## 2019-01-05 DIAGNOSIS — R2689 Other abnormalities of gait and mobility: Secondary | ICD-10-CM | POA: Diagnosis not present

## 2019-01-05 DIAGNOSIS — Z9181 History of falling: Secondary | ICD-10-CM | POA: Diagnosis not present

## 2019-01-05 DIAGNOSIS — R296 Repeated falls: Secondary | ICD-10-CM | POA: Diagnosis not present

## 2019-01-05 DIAGNOSIS — M6281 Muscle weakness (generalized): Secondary | ICD-10-CM | POA: Diagnosis not present

## 2019-01-06 DIAGNOSIS — J449 Chronic obstructive pulmonary disease, unspecified: Secondary | ICD-10-CM | POA: Diagnosis not present

## 2019-01-06 DIAGNOSIS — M6281 Muscle weakness (generalized): Secondary | ICD-10-CM | POA: Diagnosis not present

## 2019-01-06 DIAGNOSIS — R296 Repeated falls: Secondary | ICD-10-CM | POA: Diagnosis not present

## 2019-01-06 DIAGNOSIS — R2689 Other abnormalities of gait and mobility: Secondary | ICD-10-CM | POA: Diagnosis not present

## 2019-01-06 DIAGNOSIS — R2681 Unsteadiness on feet: Secondary | ICD-10-CM | POA: Diagnosis not present

## 2019-01-06 DIAGNOSIS — Z9181 History of falling: Secondary | ICD-10-CM | POA: Diagnosis not present

## 2019-01-07 DIAGNOSIS — R2681 Unsteadiness on feet: Secondary | ICD-10-CM | POA: Diagnosis not present

## 2019-01-07 DIAGNOSIS — M6281 Muscle weakness (generalized): Secondary | ICD-10-CM | POA: Diagnosis not present

## 2019-01-07 DIAGNOSIS — R2689 Other abnormalities of gait and mobility: Secondary | ICD-10-CM | POA: Diagnosis not present

## 2019-01-07 DIAGNOSIS — R296 Repeated falls: Secondary | ICD-10-CM | POA: Diagnosis not present

## 2019-01-07 DIAGNOSIS — J449 Chronic obstructive pulmonary disease, unspecified: Secondary | ICD-10-CM | POA: Diagnosis not present

## 2019-01-07 DIAGNOSIS — Z9181 History of falling: Secondary | ICD-10-CM | POA: Diagnosis not present

## 2019-01-09 DIAGNOSIS — R296 Repeated falls: Secondary | ICD-10-CM | POA: Diagnosis not present

## 2019-01-09 DIAGNOSIS — M6281 Muscle weakness (generalized): Secondary | ICD-10-CM | POA: Diagnosis not present

## 2019-01-09 DIAGNOSIS — Z9181 History of falling: Secondary | ICD-10-CM | POA: Diagnosis not present

## 2019-01-09 DIAGNOSIS — J449 Chronic obstructive pulmonary disease, unspecified: Secondary | ICD-10-CM | POA: Diagnosis not present

## 2019-01-09 DIAGNOSIS — R2681 Unsteadiness on feet: Secondary | ICD-10-CM | POA: Diagnosis not present

## 2019-01-09 DIAGNOSIS — U071 COVID-19: Secondary | ICD-10-CM | POA: Diagnosis not present

## 2019-01-09 DIAGNOSIS — R2689 Other abnormalities of gait and mobility: Secondary | ICD-10-CM | POA: Diagnosis not present

## 2019-01-10 DIAGNOSIS — R2681 Unsteadiness on feet: Secondary | ICD-10-CM | POA: Diagnosis not present

## 2019-01-10 DIAGNOSIS — J449 Chronic obstructive pulmonary disease, unspecified: Secondary | ICD-10-CM | POA: Diagnosis not present

## 2019-01-10 DIAGNOSIS — R2689 Other abnormalities of gait and mobility: Secondary | ICD-10-CM | POA: Diagnosis not present

## 2019-01-10 DIAGNOSIS — M6281 Muscle weakness (generalized): Secondary | ICD-10-CM | POA: Diagnosis not present

## 2019-01-10 DIAGNOSIS — R296 Repeated falls: Secondary | ICD-10-CM | POA: Diagnosis not present

## 2019-01-10 DIAGNOSIS — Z9181 History of falling: Secondary | ICD-10-CM | POA: Diagnosis not present

## 2019-01-11 DIAGNOSIS — Z9181 History of falling: Secondary | ICD-10-CM | POA: Diagnosis not present

## 2019-01-11 DIAGNOSIS — R2689 Other abnormalities of gait and mobility: Secondary | ICD-10-CM | POA: Diagnosis not present

## 2019-01-11 DIAGNOSIS — J449 Chronic obstructive pulmonary disease, unspecified: Secondary | ICD-10-CM | POA: Diagnosis not present

## 2019-01-11 DIAGNOSIS — M6281 Muscle weakness (generalized): Secondary | ICD-10-CM | POA: Diagnosis not present

## 2019-01-11 DIAGNOSIS — R296 Repeated falls: Secondary | ICD-10-CM | POA: Diagnosis not present

## 2019-01-11 DIAGNOSIS — R2681 Unsteadiness on feet: Secondary | ICD-10-CM | POA: Diagnosis not present

## 2019-01-13 DIAGNOSIS — R296 Repeated falls: Secondary | ICD-10-CM | POA: Diagnosis not present

## 2019-01-13 DIAGNOSIS — R2689 Other abnormalities of gait and mobility: Secondary | ICD-10-CM | POA: Diagnosis not present

## 2019-01-13 DIAGNOSIS — Z9181 History of falling: Secondary | ICD-10-CM | POA: Diagnosis not present

## 2019-01-13 DIAGNOSIS — R2681 Unsteadiness on feet: Secondary | ICD-10-CM | POA: Diagnosis not present

## 2019-01-13 DIAGNOSIS — M6281 Muscle weakness (generalized): Secondary | ICD-10-CM | POA: Diagnosis not present

## 2019-01-13 DIAGNOSIS — J449 Chronic obstructive pulmonary disease, unspecified: Secondary | ICD-10-CM | POA: Diagnosis not present

## 2019-01-16 DIAGNOSIS — R296 Repeated falls: Secondary | ICD-10-CM | POA: Diagnosis not present

## 2019-01-16 DIAGNOSIS — J449 Chronic obstructive pulmonary disease, unspecified: Secondary | ICD-10-CM | POA: Diagnosis not present

## 2019-01-16 DIAGNOSIS — R2681 Unsteadiness on feet: Secondary | ICD-10-CM | POA: Diagnosis not present

## 2019-01-16 DIAGNOSIS — M6281 Muscle weakness (generalized): Secondary | ICD-10-CM | POA: Diagnosis not present

## 2019-01-16 DIAGNOSIS — Z9181 History of falling: Secondary | ICD-10-CM | POA: Diagnosis not present

## 2019-01-16 DIAGNOSIS — R2689 Other abnormalities of gait and mobility: Secondary | ICD-10-CM | POA: Diagnosis not present

## 2019-01-17 DIAGNOSIS — R2681 Unsteadiness on feet: Secondary | ICD-10-CM | POA: Diagnosis not present

## 2019-01-17 DIAGNOSIS — G478 Other sleep disorders: Secondary | ICD-10-CM | POA: Diagnosis not present

## 2019-01-17 DIAGNOSIS — J449 Chronic obstructive pulmonary disease, unspecified: Secondary | ICD-10-CM | POA: Diagnosis not present

## 2019-01-17 DIAGNOSIS — H00015 Hordeolum externum left lower eyelid: Secondary | ICD-10-CM | POA: Diagnosis not present

## 2019-01-17 DIAGNOSIS — M6281 Muscle weakness (generalized): Secondary | ICD-10-CM | POA: Diagnosis not present

## 2019-01-17 DIAGNOSIS — R296 Repeated falls: Secondary | ICD-10-CM | POA: Diagnosis not present

## 2019-01-17 DIAGNOSIS — Z9181 History of falling: Secondary | ICD-10-CM | POA: Diagnosis not present

## 2019-01-17 DIAGNOSIS — R2689 Other abnormalities of gait and mobility: Secondary | ICD-10-CM | POA: Diagnosis not present

## 2019-01-17 DIAGNOSIS — I10 Essential (primary) hypertension: Secondary | ICD-10-CM | POA: Diagnosis not present

## 2019-01-18 DIAGNOSIS — M6281 Muscle weakness (generalized): Secondary | ICD-10-CM | POA: Diagnosis not present

## 2019-01-18 DIAGNOSIS — R296 Repeated falls: Secondary | ICD-10-CM | POA: Diagnosis not present

## 2019-01-18 DIAGNOSIS — R2681 Unsteadiness on feet: Secondary | ICD-10-CM | POA: Diagnosis not present

## 2019-01-18 DIAGNOSIS — R2689 Other abnormalities of gait and mobility: Secondary | ICD-10-CM | POA: Diagnosis not present

## 2019-01-18 DIAGNOSIS — J449 Chronic obstructive pulmonary disease, unspecified: Secondary | ICD-10-CM | POA: Diagnosis not present

## 2019-01-18 DIAGNOSIS — Z9181 History of falling: Secondary | ICD-10-CM | POA: Diagnosis not present

## 2019-01-23 DIAGNOSIS — R296 Repeated falls: Secondary | ICD-10-CM | POA: Diagnosis not present

## 2019-01-23 DIAGNOSIS — M6281 Muscle weakness (generalized): Secondary | ICD-10-CM | POA: Diagnosis not present

## 2019-01-23 DIAGNOSIS — R2689 Other abnormalities of gait and mobility: Secondary | ICD-10-CM | POA: Diagnosis not present

## 2019-01-23 DIAGNOSIS — J449 Chronic obstructive pulmonary disease, unspecified: Secondary | ICD-10-CM | POA: Diagnosis not present

## 2019-01-23 DIAGNOSIS — Z9181 History of falling: Secondary | ICD-10-CM | POA: Diagnosis not present

## 2019-01-23 DIAGNOSIS — R2681 Unsteadiness on feet: Secondary | ICD-10-CM | POA: Diagnosis not present

## 2022-03-29 ENCOUNTER — Emergency Department (HOSPITAL_COMMUNITY): Payer: Medicare Other

## 2022-03-29 ENCOUNTER — Encounter (HOSPITAL_COMMUNITY): Payer: Self-pay | Admitting: Osteopathic Medicine

## 2022-03-29 ENCOUNTER — Inpatient Hospital Stay (HOSPITAL_COMMUNITY)
Admission: EM | Admit: 2022-03-29 | Discharge: 2022-04-01 | DRG: 392 | Disposition: A | Discharge status: Skilled Nursing Facility | Payer: Medicare Other | Source: Skilled Nursing Facility | Attending: Internal Medicine | Admitting: Internal Medicine

## 2022-03-29 ENCOUNTER — Other Ambulatory Visit: Payer: Self-pay

## 2022-03-29 DIAGNOSIS — Z1152 Encounter for screening for COVID-19: Secondary | ICD-10-CM

## 2022-03-29 DIAGNOSIS — K5900 Constipation, unspecified: Secondary | ICD-10-CM

## 2022-03-29 DIAGNOSIS — Z9049 Acquired absence of other specified parts of digestive tract: Secondary | ICD-10-CM

## 2022-03-29 DIAGNOSIS — R109 Unspecified abdominal pain: Secondary | ICD-10-CM | POA: Diagnosis not present

## 2022-03-29 DIAGNOSIS — R4189 Other symptoms and signs involving cognitive functions and awareness: Secondary | ICD-10-CM | POA: Insufficient documentation

## 2022-03-29 DIAGNOSIS — E876 Hypokalemia: Secondary | ICD-10-CM | POA: Diagnosis not present

## 2022-03-29 DIAGNOSIS — I10 Essential (primary) hypertension: Secondary | ICD-10-CM | POA: Diagnosis present

## 2022-03-29 DIAGNOSIS — R051 Acute cough: Secondary | ICD-10-CM

## 2022-03-29 DIAGNOSIS — K5641 Fecal impaction: Secondary | ICD-10-CM | POA: Diagnosis present

## 2022-03-29 DIAGNOSIS — Z603 Acculturation difficulty: Secondary | ICD-10-CM | POA: Diagnosis present

## 2022-03-29 DIAGNOSIS — G3184 Mild cognitive impairment, so stated: Secondary | ICD-10-CM | POA: Diagnosis present

## 2022-03-29 DIAGNOSIS — K5909 Other constipation: Secondary | ICD-10-CM | POA: Diagnosis present

## 2022-03-29 DIAGNOSIS — J4489 Other specified chronic obstructive pulmonary disease: Secondary | ICD-10-CM | POA: Diagnosis present

## 2022-03-29 DIAGNOSIS — I25810 Atherosclerosis of coronary artery bypass graft(s) without angina pectoris: Secondary | ICD-10-CM | POA: Diagnosis present

## 2022-03-29 DIAGNOSIS — K5289 Other specified noninfective gastroenteritis and colitis: Secondary | ICD-10-CM | POA: Diagnosis not present

## 2022-03-29 DIAGNOSIS — I2510 Atherosclerotic heart disease of native coronary artery without angina pectoris: Secondary | ICD-10-CM | POA: Diagnosis present

## 2022-03-29 DIAGNOSIS — K219 Gastro-esophageal reflux disease without esophagitis: Secondary | ICD-10-CM | POA: Diagnosis present

## 2022-03-29 DIAGNOSIS — J449 Chronic obstructive pulmonary disease, unspecified: Secondary | ICD-10-CM | POA: Diagnosis present

## 2022-03-29 DIAGNOSIS — R7989 Other specified abnormal findings of blood chemistry: Secondary | ICD-10-CM | POA: Diagnosis present

## 2022-03-29 DIAGNOSIS — Z7951 Long term (current) use of inhaled steroids: Secondary | ICD-10-CM

## 2022-03-29 DIAGNOSIS — Z833 Family history of diabetes mellitus: Secondary | ICD-10-CM

## 2022-03-29 DIAGNOSIS — D72829 Elevated white blood cell count, unspecified: Secondary | ICD-10-CM | POA: Diagnosis not present

## 2022-03-29 DIAGNOSIS — Z87440 Personal history of urinary (tract) infections: Secondary | ICD-10-CM

## 2022-03-29 LAB — CBC WITH DIFFERENTIAL/PLATELET
Abs Immature Granulocytes: 0.12 10*3/uL — ABNORMAL HIGH (ref 0.00–0.07)
Basophils Absolute: 0 10*3/uL (ref 0.0–0.1)
Basophils Relative: 0 %
Eosinophils Absolute: 0 10*3/uL (ref 0.0–0.5)
Eosinophils Relative: 0 %
HCT: 33.4 % — ABNORMAL LOW (ref 36.0–46.0)
Hemoglobin: 10.7 g/dL — ABNORMAL LOW (ref 12.0–15.0)
Immature Granulocytes: 1 %
Lymphocytes Relative: 4 %
Lymphs Abs: 0.9 10*3/uL (ref 0.7–4.0)
MCH: 31.5 pg (ref 26.0–34.0)
MCHC: 32 g/dL (ref 30.0–36.0)
MCV: 98.2 fL (ref 80.0–100.0)
Monocytes Absolute: 1.4 10*3/uL — ABNORMAL HIGH (ref 0.1–1.0)
Monocytes Relative: 6 %
Neutro Abs: 21.5 10*3/uL — ABNORMAL HIGH (ref 1.7–7.7)
Neutrophils Relative %: 89 %
Platelets: 433 10*3/uL — ABNORMAL HIGH (ref 150–400)
RBC: 3.4 MIL/uL — ABNORMAL LOW (ref 3.87–5.11)
RDW: 14 % (ref 11.5–15.5)
WBC: 24 10*3/uL — ABNORMAL HIGH (ref 4.0–10.5)
nRBC: 0 % (ref 0.0–0.2)

## 2022-03-29 LAB — LACTIC ACID, PLASMA: Lactic Acid, Venous: 2 mmol/L (ref 0.5–1.9)

## 2022-03-29 LAB — COMPREHENSIVE METABOLIC PANEL
ALT: 20 U/L (ref 0–44)
AST: 28 U/L (ref 15–41)
Albumin: 3.4 g/dL — ABNORMAL LOW (ref 3.5–5.0)
Alkaline Phosphatase: 53 U/L (ref 38–126)
Anion gap: 12 (ref 5–15)
BUN: 22 mg/dL (ref 8–23)
CO2: 26 mmol/L (ref 22–32)
Calcium: 9.2 mg/dL (ref 8.9–10.3)
Chloride: 97 mmol/L — ABNORMAL LOW (ref 98–111)
Creatinine, Ser: 0.77 mg/dL (ref 0.44–1.00)
GFR, Estimated: >60 mL/min (ref >60)
Glucose, Bld: 166 mg/dL — ABNORMAL HIGH (ref 70–99)
Potassium: 3.6 mmol/L (ref 3.5–5.1)
Sodium: 135 mmol/L (ref 135–145)
Total Bilirubin: 1 mg/dL (ref 0.3–1.2)
Total Protein: 7.4 g/dL (ref 6.5–8.1)

## 2022-03-29 LAB — CULTURE, BLOOD (ROUTINE X 2)

## 2022-03-29 LAB — RESP PANEL BY RT-PCR (RSV, FLU A&B, COVID)  RVPGX2
Influenza A by PCR: NEGATIVE
Influenza B by PCR: NEGATIVE
Resp Syncytial Virus by PCR: NEGATIVE
SARS Coronavirus 2 by RT PCR: NEGATIVE

## 2022-03-29 LAB — LIPASE, BLOOD: Lipase: 31 U/L (ref 11–51)

## 2022-03-29 MED ORDER — BISACODYL 10 MG RE SUPP
10.0000 mg | Freq: Once | RECTAL | Status: AC
  Administered 2022-03-29: 10 mg via RECTAL
  Filled 2022-03-29: qty 1

## 2022-03-29 MED ORDER — MOMETASONE FURO-FORMOTEROL FUM 200-5 MCG/ACT IN AERO
2.0000 | INHALATION_SPRAY | Freq: Two times a day (BID) | RESPIRATORY_TRACT | Status: DC
  Administered 2022-03-31: 2 via RESPIRATORY_TRACT
  Filled 2022-03-29: qty 8.8

## 2022-03-29 MED ORDER — MELATONIN 5 MG PO TABS
5.0000 mg | ORAL_TABLET | Freq: Every day | ORAL | Status: DC
  Administered 2022-03-29 – 2022-03-31 (×3): 5 mg via ORAL
  Filled 2022-03-29 (×3): qty 1

## 2022-03-29 MED ORDER — FLEET ENEMA 7-19 GM/118ML RE ENEM
1.0000 | ENEMA | Freq: Once | RECTAL | Status: AC | PRN
  Administered 2022-03-31: 1 via RECTAL
  Filled 2022-03-29: qty 1

## 2022-03-29 MED ORDER — ALBUTEROL SULFATE HFA 108 (90 BASE) MCG/ACT IN AERS: 2.0000 | INHALATION_SPRAY | Freq: Four times a day (QID) | RESPIRATORY_TRACT | Status: DC | PRN

## 2022-03-29 MED ORDER — MONTELUKAST SODIUM 10 MG PO TABS: 10.0000 mg | ORAL_TABLET | Freq: Every day | ORAL | Status: DC

## 2022-03-29 MED ORDER — SODIUM CHLORIDE 0.9 % IV SOLN
500.0000 mg | Freq: Once | INTRAVENOUS | Status: AC
  Administered 2022-03-29: 500 mg via INTRAVENOUS
  Filled 2022-03-29: qty 5

## 2022-03-29 MED ORDER — BISACODYL 10 MG RE SUPP
10.0000 mg | Freq: Every day | RECTAL | Status: DC
  Administered 2022-03-30 – 2022-03-31 (×2): 10 mg via RECTAL
  Filled 2022-03-29 (×3): qty 1

## 2022-03-29 MED ORDER — ACETAMINOPHEN 650 MG RE SUPP: 650.0000 mg | Freq: Four times a day (QID) | RECTAL | Status: DC | PRN

## 2022-03-29 MED ORDER — IOHEXOL 300 MG/ML  SOLN
100.0000 mL | Freq: Once | INTRAMUSCULAR | Status: AC | PRN
  Administered 2022-03-29: 100 mL via INTRAVENOUS

## 2022-03-29 MED ORDER — ACETAMINOPHEN 325 MG PO TABS: 650.0000 mg | ORAL_TABLET | Freq: Four times a day (QID) | ORAL | Status: DC | PRN

## 2022-03-29 MED ORDER — HYDRALAZINE HCL 20 MG/ML IJ SOLN: 5.0000 mg | Freq: Four times a day (QID) | INTRAMUSCULAR | Status: DC | PRN

## 2022-03-29 MED ORDER — CARBOXYMETHYLCELLULOSE SODIUM 0.25 % OP SOLN: 1.0000 [drp] | Freq: Two times a day (BID) | OPHTHALMIC | Status: DC

## 2022-03-29 MED ORDER — SENNOSIDES-DOCUSATE SODIUM 8.6-50 MG PO TABS
2.0000 | ORAL_TABLET | Freq: Two times a day (BID) | ORAL | Status: AC
  Administered 2022-03-29 – 2022-03-30 (×3): 2 via ORAL
  Filled 2022-03-29 (×4): qty 2

## 2022-03-29 MED ORDER — HYDROCHLOROTHIAZIDE 12.5 MG PO TABS
12.5000 mg | ORAL_TABLET | Freq: Every day | ORAL | Status: DC
  Administered 2022-03-30: 12.5 mg via ORAL
  Filled 2022-03-29: qty 1

## 2022-03-29 MED ORDER — CEFTRIAXONE SODIUM 1 G IJ SOLR
1.0000 g | Freq: Once | INTRAMUSCULAR | Status: AC
  Administered 2022-03-29: 1 g via INTRAVENOUS
  Filled 2022-03-29: qty 10

## 2022-03-29 MED ORDER — CIPROFLOXACIN IN D5W 200 MG/100ML IV SOLN: 200.0000 mg | Freq: Two times a day (BID) | INTRAVENOUS | Status: DC

## 2022-03-29 MED ORDER — POLYETHYLENE GLYCOL 3350 17 G PO PACK
17.0000 g | PACK | Freq: Two times a day (BID) | ORAL | Status: DC
  Administered 2022-03-30 – 2022-03-31 (×4): 17 g via ORAL
  Filled 2022-03-29 (×5): qty 1

## 2022-03-29 MED ORDER — METRONIDAZOLE 500 MG/100ML IV SOLN
500.0000 mg | Freq: Two times a day (BID) | INTRAVENOUS | Status: DC
  Administered 2022-03-30 – 2022-04-01 (×6): 500 mg via INTRAVENOUS
  Filled 2022-03-29 (×6): qty 100

## 2022-03-29 MED ORDER — TRAZODONE HCL 100 MG PO TABS: 100.0000 mg | ORAL_TABLET | Freq: Every day | ORAL | Status: DC

## 2022-03-29 MED ORDER — LOSARTAN POTASSIUM 50 MG PO TABS
100.0000 mg | ORAL_TABLET | Freq: Every day | ORAL | Status: DC
  Administered 2022-03-30 – 2022-04-01 (×3): 100 mg via ORAL
  Filled 2022-03-29 (×3): qty 2

## 2022-03-29 MED ORDER — QUETIAPINE FUMARATE 25 MG PO TABS
25.0000 mg | ORAL_TABLET | Freq: Every day | ORAL | Status: DC
  Administered 2022-03-29 – 2022-03-31 (×3): 25 mg via ORAL
  Filled 2022-03-29 (×4): qty 1

## 2022-03-29 MED ORDER — FAMOTIDINE 20 MG PO TABS
20.0000 mg | ORAL_TABLET | Freq: Every day | ORAL | Status: DC
  Administered 2022-03-30 – 2022-04-01 (×3): 20 mg via ORAL
  Filled 2022-03-29 (×3): qty 1

## 2022-03-29 MED ORDER — ADULT MULTIVITAMIN W/MINERALS CH
1.0000 | ORAL_TABLET | Freq: Every day | ORAL | Status: DC
  Administered 2022-03-30 – 2022-04-01 (×3): 1 via ORAL
  Filled 2022-03-29 (×3): qty 1

## 2022-03-29 MED ORDER — LACTATED RINGERS IV SOLN: INTRAVENOUS | Status: DC

## 2022-03-29 MED ORDER — ENOXAPARIN SODIUM 40 MG/0.4ML IJ SOSY
40.0000 mg | PREFILLED_SYRINGE | INTRAMUSCULAR | Status: DC
  Administered 2022-04-01: 40 mg via SUBCUTANEOUS
  Filled 2022-03-29 (×2): qty 0.4

## 2022-03-29 MED ORDER — LACTATED RINGERS IV BOLUS
1000.0000 mL | Freq: Once | INTRAVENOUS | Status: AC
  Administered 2022-03-29: 1000 mL via INTRAVENOUS

## 2022-03-29 NOTE — ED Provider Notes (Signed)
[place] EMERGENCY DEPARTMENT AT [facility] Provider Note   CSN: [id number] Arrival date & time: 03/29/22  1417     History  Chief Complaint  Patient presents with   Abdominal Pain    Leslie Farley is a 89 y.o. female.  Patient c/o recent constipation and also left sided abdominal pain. Symptoms present in past week, persistent. States they gave her something at facility and had small bm yesterday. Last normal bm 3-4 days ago. Hx intermittent constipation in past. Left abd pain constant, dull, non radiating. No hx diverticulitis.  No gu c/o. Patient also with recent new/increased non prod cough. No sore throat. No specific known ill contacts. No fever or chills.  The history is provided by the patient and a relative. A language interpreter was used.  Abdominal Pain Associated symptoms: constipation and cough   Associated symptoms: no chest pain, no diarrhea, no dysuria, no fever, no shortness of breath, no sore throat and no vomiting        Home Medications Prior to Admission medications   Medication Sig Start Date End Date Taking? Authorizing Provider  acetaminophen (TYLENOL) 500 MG tablet Take 2 tablets (1,000 mg total) by mouth every 6 (six) hours as needed for mild pain or moderate pain. 11/20/16   Elson Areas, PA-C  albuterol (PROVENTIL HFA;VENTOLIN HFA) 108 (90 BASE) MCG/ACT inhaler Inhale 2 puffs into the lungs every 6 (six) hours as needed for wheezing or shortness of breath.     [provider]  bisacodyl (DULCOLAX) 10 MG suppository Place 10 mg rectally daily as needed for moderate constipation.    [provider]  budesonide-formoterol (SYMBICORT) 160-4.5 MCG/ACT inhaler Inhale 2 puffs into the lungs 2 (two) times daily.    [provider]  Carboxymethylcellulose Sodium 0.25 % SOLN Place 1 drop into both eyes 2 (two) times daily.     [provider]  dextromethorphan-guaiFENesin (MUCINEX DM) 30-600 MG 12hr tablet  Take 1 tablet by mouth 2 (two) times daily.    [provider]  gabapentin (NEURONTIN) 100 MG capsule Take 100 mg by mouth at bedtime.     [provider]  hydrochlorothiazide (HYDRODIURIL) 25 MG tablet Take 25 mg by mouth daily.    [provider]  losartan (COZAAR) 50 MG tablet Take 50 mg by mouth daily.    [provider]  meclizine (ANTIVERT) 12.5 MG tablet Take 12.5 mg by mouth 3 (three) times daily as needed for dizziness.    [provider]  Menthol (ICY HOT) 5 % PTCH Apply 1 patch topically every morning. Apply to right upper arm and bilateral knees QAM and remove QHS    [provider]  Menthol, Topical Analgesic, (BIOFREEZE) 4 % GEL Apply to neck and right shoulder three times a day for pain.    [provider]  montelukast (SINGULAIR) 10 MG tablet Take 10 mg by mouth at bedtime.     [provider]  Multiple Vitamins-Minerals (MULTIVITAMIN WITH MINERALS) tablet Take 1 tablet by mouth daily.    [provider]  nitroGLYCERIN (NITROSTAT) 0.4 MG SL tablet Place 0.4 mg under the tongue every 5 (five) minutes as needed for chest pain (Do not exceed 3 doses per episode.).    [provider]  polyethylene glycol (MIRALAX / GLYCOLAX) packet Take 17 g by mouth 2 (two) times daily.     [provider]  ranitidine (ZANTAC) 150 MG tablet Take 150 mg by mouth 2 (two) times  daily. 11/09/16   [provider]  senna (SENOKOT) 8.6 MG tablet Take 1 tablet by mouth 2 (two) times daily.    [provider]  traMADol Veatrice Bourbon) 50 MG tablet Take one tablet by mouth at bedtime for pain 11/18/15   Lauree Chandler, NP  traZODone (DESYREL) 100 MG tablet Take 100 mg by mouth at bedtime. Take 1/2 tablet to = 75 mg qhs     [provider]      Allergies    Cucumber extract    Review of Systems   Review of Systems  Constitutional:  Negative for fever.  HENT:  Negative for sore throat.    Eyes:  Negative for redness.  Respiratory:  Positive for cough. Negative for shortness of breath.   Cardiovascular:  Negative for chest pain.  Gastrointestinal:  Positive for abdominal pain and constipation. Negative for diarrhea and vomiting.  Genitourinary:  Negative for dysuria and flank pain.  Musculoskeletal:  Negative for back pain.  Skin:  Negative for rash.  Neurological:  Negative for headaches.  Hematological:  Does not bruise/bleed easily.  Psychiatric/Behavioral:  Negative for confusion.     Physical Exam Updated Vital Signs BP (!) 143/63   Pulse 88   Temp 98.6 F (37 C) (Oral)   Resp 18   SpO2 98%  Physical Exam Vitals and nursing note reviewed.  Constitutional:      Appearance: Normal appearance. She is well-developed.  HENT:     Head: Atraumatic.     Nose: Nose normal.     Mouth/Throat:     Mouth: Mucous membranes are moist.  Eyes:     General: No scleral icterus.    Conjunctiva/sclera: Conjunctivae normal.  Neck:     Trachea: No tracheal deviation.  Cardiovascular:     Rate and Rhythm: Normal rate and regular rhythm.     Pulses: Normal pulses.     Heart sounds: Normal heart sounds. No murmur heard.    No friction rub. No gallop.  Pulmonary:     Effort: Pulmonary effort is normal. No respiratory distress.     Breath sounds: Normal breath sounds.  Abdominal:     General: Bowel sounds are normal. There is distension.     Palpations: Abdomen is soft.     Tenderness: There is abdominal tenderness.     Comments: Left abd tenderness.   Genitourinary:    Comments: No cva tenderness. Chaperoned exam - rectal w large amount soft, formed stool, no mass felt, stool light brown.  Musculoskeletal:        General: No swelling or tenderness.     Cervical back: Normal range of motion and neck supple. No rigidity. No muscular tenderness.  Skin:    General: Skin is warm and dry.     Findings: No rash.  Neurological:     Mental Status: She is alert.      Comments: Alert, motor/sens grossly intact bil.   Psychiatric:        Mood and Affect: Mood normal.     ED Results / Procedures / Treatments   Labs (all labs ordered are listed, but only abnormal results are displayed) Results for orders placed or performed during the hospital encounter of 03/29/22  Resp panel by RT-PCR (RSV, Flu A&B, Covid) Anterior Nasal Swab   Specimen: Anterior Nasal Swab  Result Value Ref Range   SARS Coronavirus 2 by RT PCR NEGATIVE NEGATIVE   Influenza A by PCR NEGATIVE NEGATIVE   Influenza B  by PCR NEGATIVE NEGATIVE   Resp Syncytial Virus by PCR NEGATIVE NEGATIVE  CBC  Result Value Ref Range   WBC 24.3 (H) 4.0 - 10.5 K/uL   RBC 3.44 (L) 3.87 - 5.11 MIL/uL   Hemoglobin 10.7 (L) 12.0 - 15.0 g/dL   HCT 33.0 (L) 36.0 - 46.0 %   MCV 95.9 80.0 - 100.0 fL   MCH 31.1 26.0 - 34.0 pg   MCHC 32.4 30.0 - 36.0 g/dL   RDW 13.8 11.5 - 15.5 %   Platelets 408 (H) 150 - 400 K/uL   nRBC 0.0 0.0 - 0.2 %  Comprehensive metabolic panel  Result Value Ref Range   Sodium 135 135 - 145 mmol/L   Potassium 3.6 3.5 - 5.1 mmol/L   Chloride 97 (L) 98 - 111 mmol/L   CO2 26 22 - 32 mmol/L   Glucose, Bld 166 (H) 70 - 99 mg/dL   BUN 22 8 - 23 mg/dL   Creatinine, Ser 0.77 0.44 - 1.00 mg/dL   Calcium 9.2 8.9 - 10.3 mg/dL   Total Protein 7.4 6.5 - 8.1 g/dL   Albumin 3.4 (L) 3.5 - 5.0 g/dL   AST 28 15 - 41 U/L   ALT 20 0 - 44 U/L   Alkaline Phosphatase 53 38 - 126 U/L   Total Bilirubin 1.0 0.3 - 1.2 mg/dL   GFR, Estimated >60 >60 mL/min   Anion gap 12 5 - 15  Lipase, blood  Result Value Ref Range   Lipase 31 11 - 51 U/L  Lactic acid, plasma  Result Value Ref Range   Lactic Acid, Venous 2.0 (HH) 0.5 - 1.9 mmol/L     EKG None  Radiology DG Chest Port 1 View  Result Date: 03/29/2022 CLINICAL DATA:  Cough.  Tender abdomen. EXAM: PORTABLE CHEST 1 VIEW COMPARISON:  10/30/2014 and overlapping portions CT abdomen 03/29/2022. FINDINGS: The retro diaphragmatic bandlike densities  shown on CT from earlier today are not readily apparent on portable chest radiography. The visualized portions of the lungs appear clear. Thoracic spondylosis. Atherosclerotic calcification of the aortic arch. Heart size within normal limits. Severe right and moderate to severe left degenerative glenohumeral arthropathy. IMPRESSION: 1. No acute findings. 2. Thoracic spondylosis. 3. Severe right and moderate to severe left degenerative glenohumeral arthropathy. 4.  Aortic Atherosclerosis (ICD10-I70.0). Electronically Signed   By: Van Clines M.D.   On: 03/29/2022 18:34   CT Abdomen Pelvis W Contrast  Result Date: 03/29/2022 CLINICAL DATA:  Acute nonlocalized abdominal pain EXAM: CT ABDOMEN AND PELVIS WITH CONTRAST TECHNIQUE: Multidetector CT imaging of the abdomen and pelvis was performed using the standard protocol following bolus administration of intravenous contrast. RADIATION DOSE REDUCTION: This exam was performed according to the departmental dose-optimization program which includes automated exposure control, adjustment of the mA and/or kV according to patient size and/or use of iterative reconstruction technique. CONTRAST:  11mL OMNIPAQUE IOHEXOL 300 MG/ML  SOLN COMPARISON:  None Available. FINDINGS: Lower chest: Patchy infiltrates in the lung bases may indicate pneumonia. Small esophageal hiatal hernia. Hepatobiliary: No focal liver abnormality is seen. Status post cholecystectomy. No biliary dilatation. Pancreas: Unremarkable. No pancreatic ductal dilatation or surrounding inflammatory changes. Spleen: Normal in size without focal abnormality. Adrenals/Urinary Tract: Adrenal glands are unremarkable. Kidneys are normal, without renal calculi, focal lesion, or hydronephrosis. Bladder is unremarkable. Stomach/Bowel: Stomach is decompressed. Scattered gas and fluid in the small bowel without abnormal distention. Large amount of stool demonstrated in the rectosigmoid colon with rectosigmoid colonic  wall  thickening and stranding in the surrounding fat suggesting stercoral colitis. Scattered colonic diverticula without evidence of acute diverticulitis. Liquid stool is demonstrated in the right colon. Appendix is normal. Vascular/Lymphatic: Aortic atherosclerosis. No enlarged abdominal or pelvic lymph nodes. Reproductive: Uterus and bilateral adnexa are unremarkable. Other: No free air or free fluid in the abdomen. Mesenteric stranding is likely reactive edema. Broad-based anterior abdominal wall hernia along the midline containing a portion of the colon but without proximal obstruction. Calcified granulomas in the subcutaneous soft tissues over the gluteal region consistent with injection granulomas. Musculoskeletal: Degenerative changes in the spine and hips. IMPRESSION: 1. Patchy infiltrates in the lung bases, possibly pneumonia. 2. Large amount of stool in the rectosigmoid colon with associated wall thickening and fat stranding suggesting stercoral colitis. 3. Small esophageal hiatal hernia. 4. Aortic atherosclerosis. Electronically Signed   By: Lucienne Capers M.D.   On: 03/29/2022 17:59    Procedures Procedures    Medications Ordered in ED Medications  lactated ringers bolus 1,000 mL (has no administration in time range)  cefTRIAXone (ROCEPHIN) 1 g in sodium chloride 0.9 % 100 mL IVPB (has no administration in time range)  azithromycin (ZITHROMAX) 500 mg in sodium chloride 0.9 % 250 mL IVPB (has no administration in time range)  bisacodyl (DULCOLAX) suppository 10 mg (10 mg Rectal Given 03/29/22 1609)  iohexol (OMNIPAQUE) 300 MG/ML solution 100 mL (100 mLs Intravenous Contrast Given 03/29/22 1733)    ED Course/ Medical Decision Making/ A&P                             Medical Decision Making Problems Addressed: Acute cough: acute illness or injury with systemic symptoms Constipation, unspecified constipation type: acute illness or injury with systemic symptoms Elevated lactic acid level:  acute illness or injury Leukocytosis, unspecified type: acute illness or injury Stercoral colitis: acute illness or injury with systemic symptoms that poses a threat to life or bodily functions  Amount and/or Complexity of Data Reviewed Independent Historian:     Details: Family, hx External Data Reviewed: notes. Labs: ordered. Decision-making details documented in ED Course. Radiology: ordered and independent interpretation performed. Decision-making details documented in ED Course.  Risk OTC drugs. Prescription drug management. Decision regarding hospitalization.   Iv ns. Continuous pulse ox and cardiac monitoring. Labs ordered/sent. Imaging ordered.   Differential diagnosis includes constipation, sbo, dehydration, diverticulitis, etc . Dispo decision including potential need for admission considered - will get labs and imaging and reassess.   Reviewed nursing notes and prior charts for additional history. External reports reviewed. Additional history from: family.  Dulcolax suppository.   Cardiac monitor: sinus rhythm, rate 84.  Labs reviewed/interpreted by me - wbc elev. Lactate is elevated - LR iv bolus. Cultures sent. Infiltrate ?pneumonia noted on ct imaging, pt does endorse new/worsening cough - given above, elevated wbc, etc - will tx with iv abx.   CT reviewed/interpreted by me - large amount stool. On rectal exam, pt tolerated only manual disimpaction of v small amount of stool.  Enema ordered.   Given significant abd pain/distension, cough, elevated wbc, ?pna, will admit to medicine.   Hospitalist consulted for admission.             Final Clinical Impression(s) / ED Diagnoses Final diagnoses:  None    Rx / DC Orders ED Discharge Orders     None         Lajean Saver, MD 03/29/22 2006

## 2022-03-29 NOTE — ED Triage Notes (Signed)
Pt BIBA from [facility]. Pt has firm, distended abdomen w/tenderness.  Began yesterday. Given enema by [facility] at 1300.  [place] speaking  Aox4, hx dementia  BP: 106/60 HR: 88 SPO2: 95 RA

## 2022-03-29 NOTE — ED Notes (Signed)
Pt given soap suds enema, given instruction to hold it in for several minutes.

## 2022-03-29 NOTE — H&P (Addendum)
HISTORY AND PHYSICAL    Leslie Farley   HAL:[id number] DOB: Apr 30, 1930   Date of Service: 03/29/22 Requesting physician/APP from ED: Treatment Team:  Attending Provider: Sunnie Nielsen, DO  PCP: Pcp, No     HPI: Leslie Farley is a 89 y.o. female residing in nursing home, minimally ambulatory at baseline, brought into [facility], ED via EMS for chief complaint of abdominal pain.  Patient speaks [place], daughter translates, declined interpreter.  Reports chronic constipation, last bowel movement 3 to 4 days ago, developed significant abdominal pain/distention today along with abdominal tenderness, was given an enema at her facility without effect, brought to the ED.  No fever/chills, reports mild cough, daughter states she had a cold like illness maybe a week or so ago.  Has not had anything to eat in the past day or so due to abdominal pain.  Recently treated for UTI.  Daughter reports due to ankle fracture few years ago, patient just never got up walking much after that but she can typically transfer from bed to wheelchair, wheelchair to toilet.  In ED, vital signs stable, no significant concerns on CMP, lactate 2.0, WBC significantly elevated at 24, differential pending.  Hgb 10.7, HCT 33, PLT 408, flu/COVID-negative, blood cultures collected.  CT abdomen/pelvis concerning for potential pneumonia at lung bases, significant stool burden with stercoral colitis in the rectosigmoid colon, chest x-ray was nonacute.  Treated with suppository and enema.  EDP attempted disimpaction, minimally successful.  Patient also received soapsuds enema, minimal stool passed.  Continues to have abdominal pain.Hospitalist was consulted for admission. Urinalysis and differential ordered and pending.      Consultants:  none  Procedures: none      ASSESSMENT & PLAN:   Principal Problem:   Stercoral colitis Active Problems:   Hypertension   GERD (gastroesophageal reflux disease)   COPD  (chronic obstructive pulmonary disease) (HCC)   Chronic constipation   CAD (coronary artery disease) of artery bypass graft   Fecal impaction (HCC)   Cognitive impairment  Stercoral colitis Severe constipation Fecal impaction without obstruction Clear liquid diet Gentle IV fluids Bowel regimen Cipro/Flagyl Pain control limiting opiates Close monitoring, risk for obstruction, if pain worsening would get CTA for ischemia evaluation but this seems unlikely at this point  Leukocytosis Significant WBC elevation, uncertain baseline Uncertain if due to colitis inflammation, possible pneumonia but seems unlikely based on CXR and symptoms, possible UTI with UA pending Differential pending Urinalysis pending, Cipro as above  GERD (gastroesophageal reflux disease) Not on home meds Treat prn  COPD (chronic obstructive pulmonary disease) (HCC) No exacerbation inhaler as needed  Essential hypertension CAD (coronary artery disease) of artery bypass graft  Cognitive impairment Per daughter, she is at baseline, tends to get a bit confused in the evenings and when sick/in pain Continue Seroquel qhs   DVT prophylaxis: lovenox Pertinent IV fluids/nutrition: LR 76mL/h Central lines / invasive devices: none  Code Status: FULL CODE confirmed w/ patient and daughter on admission Family Communication: daughter at bedside  Disposition: observation TOC needs: none at this time Barriers to discharge / significant pending items: expect d/c back to long term care once (+)BM, abd pain improved, taking po                   Review of Systems:  Review of Systems  Constitutional:  Negative for chills, fever, malaise/fatigue and weight loss.  Respiratory:  Positive for cough. Negative for hemoptysis, sputum production and shortness of breath.   Cardiovascular:  Negative for chest pain, palpitations and leg swelling.  Gastrointestinal:  Positive for abdominal pain, constipation and  nausea. Negative for blood in stool, diarrhea, heartburn and vomiting.  Genitourinary:  Negative for frequency.  Musculoskeletal:  Positive for back pain. Negative for falls, joint pain and myalgias.  Skin:  Negative for rash.  Neurological:  Negative for dizziness and focal weakness.  Psychiatric/Behavioral:  Positive for memory loss (chronic). The patient has insomnia.        has a past medical history of Arthritis, Asthma, COPD (chronic obstructive pulmonary disease) ([place]), GERD (gastroesophageal reflux disease), and Hypertension.  Home meds reviewed on NH paperwork -    Allergies  Allergen Reactions   Cucumber Extract       family history includes Cancer - Other in her mother; Cancer - Prostate in her father; Diabetes in her brother. Past Surgical History:  Procedure Laterality Date   CHOLECYSTECTOMY            Objective Findings:  Vitals:   03/29/22 1700 03/29/22 1830 03/29/22 2000 03/29/22 2035  BP: (!) 143/63  (!) 147/60 (!) 156/70  Pulse: 84 88 88 87  Resp: 18  18 18   Temp:  98.6 F (37 C)    TempSrc:  Oral    SpO2: 97% 98% 94% 93%   No intake or output data in the 24 hours ending 03/29/22 2103 There were no vitals filed for this visit.  Examination:  Physical Exam Constitutional:      General: She is not in acute distress.    Appearance: She is well-developed. She is obese.  HENT:     Head: Normocephalic and atraumatic.  Cardiovascular:     Rate and Rhythm: Normal rate and regular rhythm.     Heart sounds: Normal heart sounds.  Pulmonary:     Effort: Pulmonary effort is normal. No respiratory distress.     Breath sounds: Normal breath sounds.  Abdominal:     General: Abdomen is protuberant. Bowel sounds are decreased. There is distension.     Palpations: There is no fluid wave or mass.     Tenderness: There is abdominal tenderness in the left lower quadrant. There is no guarding. Negative signs include Murphy's sign.  Skin:    General: Skin  is warm and dry.  Neurological:     General: No focal deficit present.     Mental Status: She is alert.  Psychiatric:        Mood and Affect: Mood normal.        Behavior: Behavior normal.          Scheduled Medications:   bisacodyl  10 mg Rectal Daily   enoxaparin (LOVENOX) injection  40 mg Subcutaneous Q24H   melatonin  5 mg Oral QHS   QUEtiapine  25 mg Oral QHS   senna-docusate  2 tablet Oral BID    Continuous Infusions:  azithromycin     cefTRIAXone (ROCEPHIN)  IV     lactated ringers     lactated ringers      PRN Medications:  acetaminophen **OR** acetaminophen, hydrALAZINE, sodium phosphate  Antimicrobials:  Anti-infectives (From admission, onward)    Start     Dose/Rate Route Frequency Ordered Stop   03/29/22 2015  cefTRIAXone (ROCEPHIN) 1 g in sodium chloride 0.9 % 100 mL IVPB        1 g 200 mL/hr over 30 Minutes Intravenous  Once 03/29/22 2002     03/29/22 2015  azithromycin (ZITHROMAX) 500 mg in sodium  chloride 0.9 % 250 mL IVPB        500 mg 250 mL/hr over 60 Minutes Intravenous  Once 03/29/22 2002             Data Reviewed: I have personally reviewed following labs and imaging studies  CBC: Recent Labs  Lab 03/29/22 1530  WBC 24.0*  NEUTROABS 21.5*  HGB 10.7*  HCT 33.4*  MCV 98.2  PLT 536*   Basic Metabolic Panel: Recent Labs  Lab 03/29/22 1530  NA 135  K 3.6  CL 97*  CO2 26  GLUCOSE 166*  BUN 22  CREATININE 0.77  CALCIUM 9.2   GFR: CrCl cannot be calculated (Unknown ideal weight.). Liver Function Tests: Recent Labs  Lab 03/29/22 1530  AST 28  ALT 20  ALKPHOS 53  BILITOT 1.0  PROT 7.4  ALBUMIN 3.4*   Recent Labs  Lab 03/29/22 1530  LIPASE 31   No results for input(s): "AMMONIA" in the last 168 hours. Coagulation Profile: No results for input(s): "INR", "PROTIME" in the last 168 hours. Cardiac Enzymes: No results for input(s): "CKTOTAL", "CKMB", "CKMBINDEX", "TROPONINI" in the last 168 hours. BNP (last 3  results) No results for input(s): "PROBNP" in the last 8760 hours. HbA1C: No results for input(s): "HGBA1C" in the last 72 hours. CBG: No results for input(s): "GLUCAP" in the last 168 hours. Lipid Profile: No results for input(s): "CHOL", "HDL", "LDLCALC", "TRIG", "CHOLHDL", "LDLDIRECT" in the last 72 hours. Thyroid Function Tests: No results for input(s): "TSH", "T4TOTAL", "FREET4", "T3FREE", "THYROIDAB" in the last 72 hours. Anemia Panel: No results for input(s): "VITAMINB12", "FOLATE", "FERRITIN", "TIBC", "IRON", "RETICCTPCT" in the last 72 hours. Most Recent Urinalysis On File:     Component Value Date/Time   COLORURINE STRAW (A) 11/02/2014 0832   APPEARANCEUR CLEAR 11/02/2014 0832   LABSPEC 1.010 11/02/2014 0832   PHURINE 6.0 11/02/2014 0832   GLUCOSEU NEGATIVE 11/02/2014 0832   HGBUR NEGATIVE 11/02/2014 0832   BILIRUBINUR NEGATIVE 11/02/2014 0832   KETONESUR NEGATIVE 11/02/2014 0832   PROTEINUR NEGATIVE 11/02/2014 0832   UROBILINOGEN 0.2 11/02/2014 0832   NITRITE NEGATIVE 11/02/2014 0832   LEUKOCYTESUR NEGATIVE 11/02/2014 0832   Sepsis Labs: @LABRCNTIP (procalcitonin:4,lacticidven:4)  Recent Results (from the past 240 hour(s))  Resp panel by RT-PCR (RSV, Flu A&B, Covid) Anterior Nasal Swab     Status: None   Collection Time: 03/29/22  6:40 PM   Specimen: Anterior Nasal Swab  Result Value Ref Range Status   SARS Coronavirus 2 by RT PCR NEGATIVE NEGATIVE Final    Comment: (NOTE) SARS-CoV-2 target nucleic acids are NOT DETECTED.  The SARS-CoV-2 RNA is generally detectable in upper respiratory specimens during the acute phase of infection. The lowest concentration of SARS-CoV-2 viral copies this assay can detect is 138 copies/mL. A negative result does not preclude SARS-Cov-2 infection and should not be used as the sole basis for treatment or other patient management decisions. A negative result may occur with  improper specimen collection/handling, submission of  specimen other than nasopharyngeal swab, presence of viral mutation(s) within the areas targeted by this assay, and inadequate number of viral copies(<138 copies/mL). A negative result must be combined with clinical observations, patient history, and epidemiological information. The expected result is Negative.  Fact Sheet for Patients:  EntrepreneurPulse.com.au  Fact Sheet for Healthcare Providers:  IncredibleEmployment.be  This test is no t yet approved or cleared by the [place] FDA and  has been authorized for detection and/or diagnosis of SARS-CoV-2 by FDA under an Emergency  Use Authorization (EUA). This EUA will remain  in effect (meaning this test can be used) for the duration of the COVID-19 declaration under Section 564(b)(1) of the Act, 21 U.S.C.section 360bbb-3(b)(1), unless the authorization is terminated  or revoked sooner.       Influenza A by PCR NEGATIVE NEGATIVE Final   Influenza B by PCR NEGATIVE NEGATIVE Final    Comment: (NOTE) The Xpert Xpress SARS-CoV-2/FLU/RSV plus assay is intended as an aid in the diagnosis of influenza from Nasopharyngeal swab specimens and should not be used as a sole basis for treatment. Nasal washings and aspirates are unacceptable for Xpert Xpress SARS-CoV-2/FLU/RSV testing.  Fact Sheet for Patients: BloggerCourse.com  Fact Sheet for Healthcare Providers: SeriousBroker.it  This test is not yet approved or cleared by the [place] FDA and has been authorized for detection and/or diagnosis of SARS-CoV-2 by FDA under an Emergency Use Authorization (EUA). This EUA will remain in effect (meaning this test can be used) for the duration of the COVID-19 declaration under Section 564(b)(1) of the Act, 21 U.S.C. section 360bbb-3(b)(1), unless the authorization is terminated or revoked.     Resp Syncytial Virus by PCR NEGATIVE NEGATIVE Final     Comment: (NOTE) Fact Sheet for Patients: BloggerCourse.com  Fact Sheet for Healthcare Providers: SeriousBroker.it  This test is not yet approved or cleared by the [place] FDA and has been authorized for detection and/or diagnosis of SARS-CoV-2 by FDA under an Emergency Use Authorization (EUA). This EUA will remain in effect (meaning this test can be used) for the duration of the COVID-19 declaration under Section 564(b)(1) of the Act, 21 U.S.C. section 360bbb-3(b)(1), unless the authorization is terminated or revoked.  Performed at [facility], [address], [place], [place] [address]          Radiology Studies: [facility] Chest Port 1 View  Result Date: 03/29/2022 CLINICAL DATA:  Cough.  Tender abdomen. EXAM: PORTABLE CHEST 1 VIEW COMPARISON:  10/30/2014 and overlapping portions CT abdomen 03/29/2022. FINDINGS: The retro diaphragmatic bandlike densities shown on CT from earlier today are not readily apparent on portable chest radiography. The visualized portions of the lungs appear clear. Thoracic spondylosis. Atherosclerotic calcification of the aortic arch. Heart size within normal limits. Severe right and moderate to severe left degenerative glenohumeral arthropathy. IMPRESSION: 1. No acute findings. 2. Thoracic spondylosis. 3. Severe right and moderate to severe left degenerative glenohumeral arthropathy. 4.  Aortic Atherosclerosis (ICD10-I70.0). Electronically Signed   By: Gaylyn Rong M.D.   On: 03/29/2022 18:34   CT Abdomen Pelvis W Contrast  Result Date: 03/29/2022 CLINICAL DATA:  Acute nonlocalized abdominal pain EXAM: CT ABDOMEN AND PELVIS WITH CONTRAST TECHNIQUE: Multidetector CT imaging of the abdomen and pelvis was performed using the standard protocol following bolus administration of intravenous contrast. RADIATION DOSE REDUCTION: This exam was performed according to the departmental  dose-optimization program which includes automated exposure control, adjustment of the mA and/or kV according to patient size and/or use of iterative reconstruction technique. CONTRAST:  OMNIPAQUE IOHEXOL 300 MG/ML  SOLN COMPARISON:  None Available. FINDINGS: Lower chest: Patchy infiltrates in the lung bases may indicate pneumonia. Small esophageal hiatal hernia. Hepatobiliary: No focal liver abnormality is seen. Status post cholecystectomy. No biliary dilatation. Pancreas: Unremarkable. No pancreatic ductal dilatation or surrounding inflammatory changes. Spleen: Normal in size without focal abnormality. Adrenals/Urinary Tract: Adrenal glands are unremarkable. Kidneys are normal, without renal calculi, focal lesion, or hydronephrosis. Bladder is unremarkable. Stomach/Bowel: Stomach is decompressed. Scattered gas and fluid  in the small bowel without abnormal distention. Large amount of stool demonstrated in the rectosigmoid colon with rectosigmoid colonic wall thickening and stranding in the surrounding fat suggesting stercoral colitis. Scattered colonic diverticula without evidence of acute diverticulitis. Liquid stool is demonstrated in the right colon. Appendix is normal. Vascular/Lymphatic: Aortic atherosclerosis. No enlarged abdominal or pelvic lymph nodes. Reproductive: Uterus and bilateral adnexa are unremarkable. Other: No free air or free fluid in the abdomen. Mesenteric stranding is likely reactive edema. Broad-based anterior abdominal wall hernia along the midline containing a portion of the colon but without proximal obstruction. Calcified granulomas in the subcutaneous soft tissues over the gluteal region consistent with injection granulomas. Musculoskeletal: Degenerative changes in the spine and hips. IMPRESSION: 1. Patchy infiltrates in the lung bases, possibly pneumonia. 2. Large amount of stool in the rectosigmoid colon with associated wall thickening and fat stranding suggesting stercoral  colitis. 3. Small esophageal hiatal hernia. 4. Aortic atherosclerosis. Electronically Signed   By: Lucienne Capers M.D.   On: 03/29/2022 17:59             LOS: 0 days    Time spent: 55 min    Emeterio Reeve, DO [facility] 03/29/2022, 9:03 PM    Dictation software may have been used to generate the above note. Typos may occur and escape review in typed/dictated notes. Please contact Dr Sheppard Coil directly for clarity if needed.  Staff may message me via secure chat in [place]  but this may not receive an immediate response,  please page me for urgent matters!  If 7PM-7AM, please contact night coverage [email or web address]

## 2022-03-30 DIAGNOSIS — I2510 Atherosclerotic heart disease of native coronary artery without angina pectoris: Secondary | ICD-10-CM | POA: Diagnosis present

## 2022-03-30 DIAGNOSIS — R4189 Other symptoms and signs involving cognitive functions and awareness: Secondary | ICD-10-CM | POA: Diagnosis not present

## 2022-03-30 DIAGNOSIS — D72829 Elevated white blood cell count, unspecified: Secondary | ICD-10-CM | POA: Diagnosis not present

## 2022-03-30 DIAGNOSIS — Z87440 Personal history of urinary (tract) infections: Secondary | ICD-10-CM | POA: Diagnosis not present

## 2022-03-30 DIAGNOSIS — E876 Hypokalemia: Secondary | ICD-10-CM | POA: Diagnosis not present

## 2022-03-30 DIAGNOSIS — K5641 Fecal impaction: Secondary | ICD-10-CM | POA: Diagnosis present

## 2022-03-30 DIAGNOSIS — Z603 Acculturation difficulty: Secondary | ICD-10-CM | POA: Diagnosis present

## 2022-03-30 DIAGNOSIS — J410 Simple chronic bronchitis: Secondary | ICD-10-CM

## 2022-03-30 DIAGNOSIS — Z1152 Encounter for screening for COVID-19: Secondary | ICD-10-CM | POA: Diagnosis not present

## 2022-03-30 DIAGNOSIS — K5909 Other constipation: Secondary | ICD-10-CM | POA: Diagnosis not present

## 2022-03-30 DIAGNOSIS — Z7951 Long term (current) use of inhaled steroids: Secondary | ICD-10-CM | POA: Diagnosis not present

## 2022-03-30 DIAGNOSIS — I25708 Atherosclerosis of coronary artery bypass graft(s), unspecified, with other forms of angina pectoris: Secondary | ICD-10-CM | POA: Diagnosis not present

## 2022-03-30 DIAGNOSIS — Z833 Family history of diabetes mellitus: Secondary | ICD-10-CM | POA: Diagnosis not present

## 2022-03-30 DIAGNOSIS — I10 Essential (primary) hypertension: Secondary | ICD-10-CM

## 2022-03-30 DIAGNOSIS — I25810 Atherosclerosis of coronary artery bypass graft(s) without angina pectoris: Secondary | ICD-10-CM | POA: Diagnosis present

## 2022-03-30 DIAGNOSIS — K219 Gastro-esophageal reflux disease without esophagitis: Secondary | ICD-10-CM

## 2022-03-30 DIAGNOSIS — J4489 Other specified chronic obstructive pulmonary disease: Secondary | ICD-10-CM | POA: Diagnosis present

## 2022-03-30 DIAGNOSIS — R7989 Other specified abnormal findings of blood chemistry: Secondary | ICD-10-CM | POA: Diagnosis present

## 2022-03-30 DIAGNOSIS — G3184 Mild cognitive impairment, so stated: Secondary | ICD-10-CM | POA: Diagnosis present

## 2022-03-30 DIAGNOSIS — Z9049 Acquired absence of other specified parts of digestive tract: Secondary | ICD-10-CM | POA: Diagnosis not present

## 2022-03-30 DIAGNOSIS — K5289 Other specified noninfective gastroenteritis and colitis: Secondary | ICD-10-CM | POA: Diagnosis present

## 2022-03-30 DIAGNOSIS — R109 Unspecified abdominal pain: Secondary | ICD-10-CM | POA: Diagnosis present

## 2022-03-30 LAB — BASIC METABOLIC PANEL
Anion gap: 12 (ref 5–15)
BUN: 21 mg/dL (ref 8–23)
CO2: 25 mmol/L (ref 22–32)
Calcium: 9.2 mg/dL (ref 8.9–10.3)
Chloride: 96 mmol/L — ABNORMAL LOW (ref 98–111)
Creatinine, Ser: 0.73 mg/dL (ref 0.44–1.00)
GFR, Estimated: >60 mL/min (ref >60)
Glucose, Bld: 136 mg/dL — ABNORMAL HIGH (ref 70–99)
Potassium: 3.4 mmol/L — ABNORMAL LOW (ref 3.5–5.1)
Sodium: 133 mmol/L — ABNORMAL LOW (ref 135–145)

## 2022-03-30 LAB — CBC
HCT: 30.2 % — ABNORMAL LOW (ref 36.0–46.0)
Hemoglobin: 9.9 g/dL — ABNORMAL LOW (ref 12.0–15.0)
MCH: 31.4 pg (ref 26.0–34.0)
MCHC: 32.8 g/dL (ref 30.0–36.0)
MCV: 95.9 fL (ref 80.0–100.0)
Platelets: 360 10*3/uL (ref 150–400)
RBC: 3.15 MIL/uL — ABNORMAL LOW (ref 3.87–5.11)
RDW: 14.2 % (ref 11.5–15.5)
WBC: 22.3 10*3/uL — ABNORMAL HIGH (ref 4.0–10.5)
nRBC: 0 % (ref 0.0–0.2)

## 2022-03-30 MED ORDER — TIZANIDINE HCL 4 MG PO TABS
2.0000 mg | ORAL_TABLET | Freq: Every day | ORAL | Status: DC
  Administered 2022-03-30 – 2022-03-31 (×2): 2 mg via ORAL
  Filled 2022-03-30 (×2): qty 1

## 2022-03-30 MED ORDER — MAGNESIUM HYDROXIDE 400 MG/5ML PO SUSP: 30.0000 mL | Freq: Every day | ORAL | Status: DC | PRN

## 2022-03-30 MED ORDER — MOMETASONE FURO-FORMOTEROL FUM 100-5 MCG/ACT IN AERO: 2.0000 | INHALATION_SPRAY | Freq: Two times a day (BID) | RESPIRATORY_TRACT | Status: DC

## 2022-03-30 MED ORDER — ALBUTEROL SULFATE (2.5 MG/3ML) 0.083% IN NEBU: 2.5000 mg | INHALATION_SOLUTION | Freq: Four times a day (QID) | RESPIRATORY_TRACT | Status: DC | PRN

## 2022-03-30 MED ORDER — ACETAMINOPHEN 500 MG PO TABS: 1000.0000 mg | ORAL_TABLET | Freq: Four times a day (QID) | ORAL | Status: DC | PRN

## 2022-03-30 MED ORDER — SODIUM CHLORIDE 0.9 % IV SOLN
2.0000 g | INTRAVENOUS | Status: DC
  Administered 2022-03-30 – 2022-04-01 (×3): 2 g via INTRAVENOUS
  Filled 2022-03-30 (×3): qty 20

## 2022-03-30 MED ORDER — VITAMIN D 25 MCG (1000 UNIT) PO TABS
2000.0000 [IU] | ORAL_TABLET | Freq: Every day | ORAL | Status: DC
  Administered 2022-03-30 – 2022-04-01 (×3): 2000 [IU] via ORAL
  Filled 2022-03-30 (×3): qty 2

## 2022-03-30 MED ORDER — CERTAVITE/ANTIOXIDANTS PO TABS: ORAL_TABLET | Freq: Every day | ORAL | Status: DC

## 2022-03-30 MED ORDER — POTASSIUM CHLORIDE CRYS ER 20 MEQ PO TBCR
40.0000 meq | EXTENDED_RELEASE_TABLET | Freq: Once | ORAL | Status: AC
  Administered 2022-03-30: 40 meq via ORAL
  Filled 2022-03-30: qty 2

## 2022-03-30 MED ORDER — CYCLOSPORINE 0.05 % OP EMUL
1.0000 [drp] | Freq: Two times a day (BID) | OPHTHALMIC | Status: DC
  Administered 2022-03-30 – 2022-04-01 (×5): 1 [drp] via OPHTHALMIC
  Filled 2022-03-30 (×5): qty 30

## 2022-03-30 MED ORDER — SIMETHICONE 80 MG PO CHEW: 120.0000 mg | CHEWABLE_TABLET | Freq: Four times a day (QID) | ORAL | Status: DC | PRN

## 2022-03-30 MED ORDER — NUTRISOURCE FIBER PO PACK
1.0000 | PACK | Freq: Two times a day (BID) | ORAL | Status: DC
  Administered 2022-03-30 – 2022-04-01 (×5): 1
  Filled 2022-03-30 (×5): qty 1

## 2022-03-30 MED ORDER — POLYVINYL ALCOHOL 1.4 % OP SOLN
1.0000 [drp] | Freq: Two times a day (BID) | OPHTHALMIC | Status: DC
  Administered 2022-03-30 – 2022-04-01 (×5): 1 [drp] via OPHTHALMIC
  Filled 2022-03-30: qty 15

## 2022-03-30 NOTE — Progress Notes (Signed)
Patient refused Lovenox injection, patient educated via interpreter but continues to refuse. MD made aware.

## 2022-03-30 NOTE — Progress Notes (Signed)
PROGRESS NOTE    Leslie Farley  GHW:[id number] DOB: 19-Dec-1930 DOA: 03/29/2022 PCP: Merryl Hacker, No    Brief Narrative:  Leslie Farley is a 89 y.o female with past medical history of asthma, COPD, GERD, hypertension from skilled nursing facility who is minimally ambulatory at baseline presented to hospital with abdominal pain.  Patient had been having  constipation for the last 3 to 4 days and developed abdominal pain and distention.  Patient was given enema at the facility without any effect.  Patient also had decreased appetite and recently was treated for UTI.  In the ED, patient had mild elevation of lactate but WBC was significantly elevated at 24 K.  Flu/COVID-negative, blood cultures collected.  CT abdomen/pelvis concerning for potential pneumonia at lung bases, significant stool burden with stercoral colitis in the rectosigmoid colon, chest x-ray was nonacute.  Patient was then treated with suppository and enema.  EDP attempted disimpaction, minimally successful.  Patient also received soapsuds enema, minimal stool passed.  Patient did continue to have abdominal pain so patient was admitted hospital for further evaluation and treatment  Assessment and Plan   Stercoral colitis Severe constipation Fecal impaction without obstruction On clears, continue bowel regimen, Rocephin, metro for stercoral colitis, used interpreter service.  Patient complains of diffuse abdominal pain.  Will continue with aggressive bowel regimen.   Leukocytosis Significant WBC elevation on presentation at 24 K.  As trended down to 22.3 today.Marland Kitchen  Uncertain baseline.  CT showing basal infiltrates.  Continue antibiotic with Cipro admitted.  Check urinalysis.  Low-grade fever at 99.9 F.  Blood cultures negative in 12 hours.. Influenza, flu and RSV was negative.  Lactate was elevated at 2.0.  LFTs within normal limits.  Lipase within normal limits.  Mild hypokalemia.  Will replace orally.  Check levels in AM.   GERD  (gastroesophageal reflux disease) Not on medications at home.   COPD (chronic obstructive pulmonary disease) (HCC) Continue bronchodilators as needed.  No acute exacerbation.  Essential hypertension CAD (coronary artery disease) of artery bypass graft On hydrochlorothiazide losartan at home.   hold hydrochlorothiazide.  Cognitive impairment Mild cognitive impairment and confusion during the evening time.  Continue Seroquel qhs    DVT prophylaxis: enoxaparin (LOVENOX) injection 40 mg Start: 03/30/22 1000   Code Status:     Code Status: Full Code  Disposition: Skilled nursing facility.  Status is: Observation  The patient will require care spanning > 2 midnights and should be moved to inpatient because: Ongoing abdominal pain, on clears, bowel impaction, electrolyte imbalance, significant leukocytosis.   Family Communication: None at bedside.  Consultants:  None  Procedures:  None  Antimicrobials:  Rocephin and Flagyl.  Anti-infectives (From admission, onward)    Start     Dose/Rate Route Frequency Ordered Stop   03/30/22 1000  cefTRIAXone (ROCEPHIN) 2 g in sodium chloride 0.9 % 100 mL IVPB        2 g 200 mL/hr over 30 Minutes Intravenous Every 24 hours 03/30/22 0059     03/30/22 0100  metroNIDAZOLE (FLAGYL) IVPB 500 mg        500 mg 100 mL/hr over 60 Minutes Intravenous Every 12 hours 03/29/22 2158     03/29/22 2200  ciprofloxacin (CIPRO) IVPB 200 mg  Status:  Discontinued        200 mg 100 mL/hr over 60 Minutes Intravenous Every 12 hours 03/29/22 2158 03/30/22 0059   03/29/22 2015  cefTRIAXone (ROCEPHIN) 1 g in sodium chloride 0.9 % 100 mL IVPB  1 g 200 mL/hr over 30 Minutes Intravenous  Once 03/29/22 2002 03/29/22 2225   03/29/22 2015  azithromycin (ZITHROMAX) 500 mg in sodium chloride 0.9 % 250 mL IVPB        500 mg 250 mL/hr over 60 Minutes Intravenous  Once 03/29/22 2002 03/30/22 0019      Subjective: Today, patient was seen and examined at bedside.   Used [employer], dispensing.  Patient complains of diffuse abdominal pain.  No nausea vomiting.  On clears.  Had bowel movement yesterday.  Poor historian.  Objective: Vitals:   03/30/22 0000 03/30/22 0247 03/30/22 0636 03/30/22 0950  BP:  (!) 153/63 (!) 156/66 (!) 163/82  Pulse:  81 80 81  Resp:  16 16 18   Temp:  98.5 F (36.9 C) 99.9 F (37.7 C) 97.8 F (36.6 C)  TempSrc:  Oral Oral Oral  SpO2:  95% 96% 95%  Weight: 96.7 kg       Intake/Output Summary (Last 24 hours) at 03/30/2022 1216 Last data filed at 03/30/2022 1000 Gross per 24 hour  Intake 1478.09 ml  Output 300 ml  Net 1178.09 ml   Filed Weights   03/30/22 0000  Weight: 96.7 kg    Physical Examination: Body mass index is 35.48 kg/m.   General: Obese built, not in obvious distress HENT:   No scleral pallor or icterus noted. Oral mucosa is moist.  Chest:  Clear breath sounds.  Diminished breath sounds bilaterally. No crackles or wheezes.  CVS: S1 &S2 heard. No murmur.  Regular rate and rhythm. Abdomen: Soft, mildly distended abdomen with nonspecific tenderness.    Bowel sounds are heard.   Extremities: No cyanosis, clubbing or edema.  Peripheral pulses are palpable. Psych: Alert, awake and oriented, normal mood CNS:  No cranial nerve deficits.  Power equal in all extremities.   Skin: Warm and dry.  No rashes noted.  Data Reviewed:   CBC: Recent Labs  Lab 03/29/22 1530 03/30/22 0418  WBC 24.0* 22.3*  NEUTROABS 21.5*  --   HGB 10.7* 9.9*  HCT 33.4* 30.2*  MCV 98.2 95.9  PLT 433* 151    Basic Metabolic Panel: Recent Labs  Lab 03/29/22 1530 03/30/22 0418  NA 135 133*  K 3.6 3.4*  CL 97* 96*  CO2 26 25  GLUCOSE 166* 136*  BUN 22 21  CREATININE 0.77 0.73  CALCIUM 9.2 9.2    Liver Function Tests: Recent Labs  Lab 03/29/22 1530  AST 28  ALT 20  ALKPHOS 53  BILITOT 1.0  PROT 7.4  ALBUMIN 3.4*     Radiology Studies: DG Chest Port 1 View  Result Date: 03/29/2022 CLINICAL DATA:   Cough.  Tender abdomen. EXAM: PORTABLE CHEST 1 VIEW COMPARISON:  10/30/2014 and overlapping portions CT abdomen 03/29/2022. FINDINGS: The retro diaphragmatic bandlike densities shown on CT from earlier today are not readily apparent on portable chest radiography. The visualized portions of the lungs appear clear. Thoracic spondylosis. Atherosclerotic calcification of the aortic arch. Heart size within normal limits. Severe right and moderate to severe left degenerative glenohumeral arthropathy. IMPRESSION: 1. No acute findings. 2. Thoracic spondylosis. 3. Severe right and moderate to severe left degenerative glenohumeral arthropathy. 4.  Aortic Atherosclerosis (ICD10-I70.0). Electronically Signed   By: Van Clines M.D.   On: 03/29/2022 18:34   CT Abdomen Pelvis W Contrast  Result Date: 03/29/2022 CLINICAL DATA:  Acute nonlocalized abdominal pain EXAM: CT ABDOMEN AND PELVIS WITH CONTRAST TECHNIQUE: Multidetector CT imaging of the abdomen and pelvis  was performed using the standard protocol following bolus administration of intravenous contrast. RADIATION DOSE REDUCTION: This exam was performed according to the departmental dose-optimization program which includes automated exposure control, adjustment of the mA and/or kV according to patient size and/or use of iterative reconstruction technique. CONTRAST:  169mL OMNIPAQUE IOHEXOL 300 MG/ML  SOLN COMPARISON:  None Available. FINDINGS: Lower chest: Patchy infiltrates in the lung bases may indicate pneumonia. Small esophageal hiatal hernia. Hepatobiliary: No focal liver abnormality is seen. Status post cholecystectomy. No biliary dilatation. Pancreas: Unremarkable. No pancreatic ductal dilatation or surrounding inflammatory changes. Spleen: Normal in size without focal abnormality. Adrenals/Urinary Tract: Adrenal glands are unremarkable. Kidneys are normal, without renal calculi, focal lesion, or hydronephrosis. Bladder is unremarkable. Stomach/Bowel: Stomach  is decompressed. Scattered gas and fluid in the small bowel without abnormal distention. Large amount of stool demonstrated in the rectosigmoid colon with rectosigmoid colonic wall thickening and stranding in the surrounding fat suggesting stercoral colitis. Scattered colonic diverticula without evidence of acute diverticulitis. Liquid stool is demonstrated in the right colon. Appendix is normal. Vascular/Lymphatic: Aortic atherosclerosis. No enlarged abdominal or pelvic lymph nodes. Reproductive: Uterus and bilateral adnexa are unremarkable. Other: No free air or free fluid in the abdomen. Mesenteric stranding is likely reactive edema. Broad-based anterior abdominal wall hernia along the midline containing a portion of the colon but without proximal obstruction. Calcified granulomas in the subcutaneous soft tissues over the gluteal region consistent with injection granulomas. Musculoskeletal: Degenerative changes in the spine and hips. IMPRESSION: 1. Patchy infiltrates in the lung bases, possibly pneumonia. 2. Large amount of stool in the rectosigmoid colon with associated wall thickening and fat stranding suggesting stercoral colitis. 3. Small esophageal hiatal hernia. 4. Aortic atherosclerosis. Electronically Signed   By: Lucienne Capers M.D.   On: 03/29/2022 17:59      LOS: 0 days    Flora Lipps, MD [facility] Available via Epic secure chat 7am-7pm After these hours, please refer to coverage provider listed on [email or web address] 03/30/2022, 12:16 PM

## 2022-03-30 NOTE — Hospital Course (Signed)
Jeananne Bedwell is a 89 y.o female with past medical history of asthma, COPD, GERD, hypertension from skilled nursing facility who is minimally ambulatory at baseline presented to hospital with abdominal pain.  Patient had been having  constipation for the last 3 to 4 days and developed abdominal pain and distention.  Patient was given enema at the facility without any effect.  Patient also had decreased appetite and recently was treated for UTI.  In the ED patient had mild elevation of lactate but WBC was significantly elevated at 24 K.  Flu/COVID-negative, blood cultures collected.  CT abdomen/pelvis concerning for potential pneumonia at lung bases, significant stool burden with stercoral colitis in the rectosigmoid colon, chest x-ray was nonacute.  Patient was then treated with suppository and enema.  EDP attempted disimpaction, minimally successful.  Patient also received soapsuds enema, minimal stool passed.  Patient did continue to have abdominal pain so patient was admitted hospital for further evaluation and treatment  Stercoral colitis Severe constipation Fecal impaction without obstruction On clears, continue bowel regimen, Cipro metro for stercoral colitis,   Leukocytosis Significant WBC elevation on presentation at 24 K.  As trended down to 22.3 today.Marland Kitchen  Uncertain baseline.  CT showing basal infiltrates.  Continue antibiotic with Cipro admitted.  Check urinalysis.  Low-grade fever at 99.9 F.  Blood cultures pending at this time. Influenza, flu and RSV was negative.  Lactate was elevated at 2.0.  LFTs within normal limits.  Lipase within normal limits.  Mild hypokalemia.  Will replace orally.  Check levels daily.   GERD (gastroesophageal reflux disease) Not on medications at home.   COPD (chronic obstructive pulmonary disease) (HCC) Continue bronchodilators as needed.  No acute exacerbation.  Essential hypertension CAD (coronary artery disease) of artery bypass graft On  hydrochlorothiazide losartan at home.   hold hydrochlorothiazide.  Cognitive impairment Mild cognitive impairment and confusion during the evening time.  Seroquel qhs

## 2022-03-31 DIAGNOSIS — K5289 Other specified noninfective gastroenteritis and colitis: Secondary | ICD-10-CM | POA: Diagnosis not present

## 2022-03-31 DIAGNOSIS — R4189 Other symptoms and signs involving cognitive functions and awareness: Secondary | ICD-10-CM | POA: Diagnosis not present

## 2022-03-31 DIAGNOSIS — I25708 Atherosclerosis of coronary artery bypass graft(s), unspecified, with other forms of angina pectoris: Secondary | ICD-10-CM | POA: Diagnosis not present

## 2022-03-31 DIAGNOSIS — J410 Simple chronic bronchitis: Secondary | ICD-10-CM | POA: Diagnosis not present

## 2022-03-31 LAB — BASIC METABOLIC PANEL
Anion gap: 8 (ref 5–15)
BUN: 26 mg/dL — ABNORMAL HIGH (ref 8–23)
CO2: 26 mmol/L (ref 22–32)
Calcium: 9.1 mg/dL (ref 8.9–10.3)
Chloride: 101 mmol/L (ref 98–111)
Creatinine, Ser: 0.69 mg/dL (ref 0.44–1.00)
GFR, Estimated: >60 mL/min (ref >60)
Glucose, Bld: 133 mg/dL — ABNORMAL HIGH (ref 70–99)
Potassium: 3.7 mmol/L (ref 3.5–5.1)
Sodium: 135 mmol/L (ref 135–145)

## 2022-03-31 LAB — CULTURE, BLOOD (ROUTINE X 2): Culture: NO GROWTH

## 2022-03-31 LAB — CBC
HCT: 30.9 % — ABNORMAL LOW (ref 36.0–46.0)
Hemoglobin: 10 g/dL — ABNORMAL LOW (ref 12.0–15.0)
MCH: 31.8 pg (ref 26.0–34.0)
MCHC: 32.4 g/dL (ref 30.0–36.0)
MCV: 98.4 fL (ref 80.0–100.0)
Platelets: 322 10*3/uL (ref 150–400)
RBC: 3.14 MIL/uL — ABNORMAL LOW (ref 3.87–5.11)
RDW: 14.2 % (ref 11.5–15.5)
WBC: 17.2 10*3/uL — ABNORMAL HIGH (ref 4.0–10.5)
nRBC: 0 % (ref 0.0–0.2)

## 2022-03-31 LAB — MAGNESIUM: Magnesium: 2.6 mg/dL — ABNORMAL HIGH (ref 1.7–2.4)

## 2022-03-31 NOTE — TOC Initial Note (Signed)
Transition of Care [facility]) - Initial/Assessment Note    Patient Details  Name: Leslie Farley MRN: [id number] Date of Birth: [birth year]  [facility]) CM/SW Contact:    Lennart Pall, LCSW Phone Number: 03/31/2022, 1:11 PM  Clinical Narrative:                 Have spoken with pt's daughter, Sabino Snipes, to confirm dc plan will be for pt to return to LTC bed at [facility] when medically cleared.  Daughter confirms pt has been a patient at facility for ~ 1 yr under her LTC Medicaid benefits. Aware TOC will follow to assist with return.  No questions or concerns at this time.  Expected Discharge Plan: Long Term Nursing Home Barriers to Discharge: Continued Medical Work up   Patient Goals and CMS Choice Patient states their goals for this hospitalization and ongoing recovery are:: return to LTC bed          Expected Discharge Plan and Services In-house Referral: Clinical Social Work   Post Acute Care Choice: Nursing Home Living arrangements for the past 2 months: [place]                 DME Arranged: N/A DME Agency: NA                  Prior Living Arrangements/Services Living arrangements for the past 2 months: [place] Lives with:: Facility Resident Patient language and need for interpreter reviewed:: Yes Do you feel safe going back to the place where you live?: Yes      Need for Family Participation in Patient Care: No (Comment) Care giver support system in place?: Yes (comment)   Criminal Activity/Legal Involvement Pertinent to Current Situation/Hospitalization: No - Comment as needed  Activities of Daily Living Home Assistive Devices/Equipment: Wheelchair ADL Screening (condition at time of admission) Patient's cognitive ability adequate to safely complete daily activities?: Yes Is the patient deaf or have difficulty hearing?: No Does the patient have difficulty seeing, even when wearing glasses/contacts?: No Does  the patient have difficulty concentrating, remembering, or making decisions?: Yes Patient able to express need for assistance with ADLs?: Yes Does the patient have difficulty dressing or bathing?: Yes Independently performs ADLs?: No Communication: Independent Dressing (OT): Needs assistance Is this a change from baseline?: Pre-admission baseline Grooming: Needs assistance Is this a change from baseline?: Pre-admission baseline Feeding: Independent Bathing: Needs assistance Is this a change from baseline?: Pre-admission baseline Toileting: Needs assistance Is this a change from baseline?: Pre-admission baseline In/Out Bed: Needs assistance Is this a change from baseline?: Pre-admission baseline Walks in Home: Dependent Is this a change from baseline?: Pre-admission baseline Does the patient have difficulty walking or climbing stairs?: Yes Weakness of Legs: Both Weakness of Arms/Hands: Both  Permission Sought/Granted Permission sought to share information with : Family Supports, Chartered certified accountant granted to share information with : Yes, Verbal Permission Granted  Share Information with NAME: Evlyn Kanner     Permission granted to share info w Relationship: daughter  Permission granted to share info w Contact Information: [phone]  Emotional Assessment Appearance:: Appears stated age Attitude/Demeanor/Rapport: Unable to Assess Affect (typically observed): Unable to Assess Orientation: : Oriented to Self Alcohol / Substance Use: Not Applicable Psych Involvement: No (comment)  Admission diagnosis:  Colitis [K52.9] Elevated lactic acid level [R79.89] Constipation, unspecified constipation type [K59.00] Leukocytosis, unspecified type [D72.829] Stercoral colitis [K52.89] Acute cough [R05.1] Patient Active Problem List   Diagnosis Date  Noted   Stercoral colitis 03/29/2022   Fecal impaction ([place]) 03/29/2022   Cognitive impairment 03/29/2022    Chronic bronchitis ([place]) 01/15/2016   Insomnia 01/15/2016   Constipation 01/15/2016   CAD (coronary artery disease) of artery bypass graft 01/15/2016   Benign paroxysmal positional vertigo 01/15/2016   History of pulmonary embolism 01/14/2016   Peripheral neuropathy 06/26/2015   Chronic constipation 05/10/2015   COPD (chronic obstructive pulmonary disease) ([place]) 03/01/2015   Acute encephalopathy 11/10/2014   Right thyroid nodule 11/02/2014   Hypertension    GERD (gastroesophageal reflux disease)    Arthritis    PCP:  Pcp, No Pharmacy:   [facility] [address] ([address]), [place] - [address] [place] ([place]) [place] [address] Phone: [phone] Fax: [phone]     Social Determinants of Health (SDOH) Social History: SDOH Screenings   Tobacco Use: Medium Risk (03/29/2022)   SDOH Interventions:     Readmission Risk Interventions    03/31/2022    1:08 PM  Readmission Risk Prevention Plan  Post Dischage Appt Complete  Medication Screening Complete  Transportation Screening Complete

## 2022-03-31 NOTE — NC FL2 (Signed)
[place] LEVEL OF CARE FORM     IDENTIFICATION  Patient Name: Leslie Farley Birthdate: 1930/11/02 Sex: female Admission Date (Current Location): 03/29/2022  [facility] [place] and [place] Number:  Herbalist and Address:  [facility],  [address], [place]      Provider Number: [id number]  Attending Physician Name and Address:  Flora Lipps, MD  Relative Name and Phone Number:  daughter, Evlyn Kanner [phone]    Current Level of Care: Hospital Recommended Level of Care: Nursing Facility Prior Approval Number:    Date Approved/Denied:   PASRR Number: [id number]  Discharge Plan: SNF    Current Diagnoses: Patient Active Problem List   Diagnosis Date Noted   Stercoral colitis 03/29/2022   Fecal impaction ([place]) 03/29/2022   Cognitive impairment 03/29/2022   Chronic bronchitis ([place]) 01/15/2016   Insomnia 01/15/2016   Constipation 01/15/2016   CAD (coronary artery disease) of artery bypass graft 01/15/2016   Benign paroxysmal positional vertigo 01/15/2016   History of pulmonary embolism 01/14/2016   Peripheral neuropathy 06/26/2015   Chronic constipation 05/10/2015   COPD (chronic obstructive pulmonary disease) ([place]) 03/01/2015   Acute encephalopathy 11/10/2014   Right thyroid nodule 11/02/2014   Hypertension    GERD (gastroesophageal reflux disease)    Arthritis     Orientation RESPIRATION BLADDER Height & Weight     Self  Normal Incontinent, External catheter Weight: 213 lb 3.2 oz (96.7 kg) Height:     BEHAVIORAL SYMPTOMS/MOOD NEUROLOGICAL BOWEL NUTRITION STATUS      Incontinent    AMBULATORY STATUS COMMUNICATION OF NEEDS Skin   Extensive Assist Verbally Normal                       Personal Care Assistance Level of Assistance  Bathing, Dressing Bathing Assistance: Limited assistance   Dressing Assistance: Limited assistance     Functional Limitations Info             SPECIAL CARE  FACTORS FREQUENCY                       Contractures Contractures Info: Not present    Additional Factors Info  Code Status, Allergies Code Status Info: Full Allergies Info: cucumber extract           Current Medications (03/31/2022):  This is the current hospital active medication list Current Facility-Administered Medications  Medication Dose Route Frequency Provider Last Rate Last Admin   acetaminophen (TYLENOL) tablet 650 mg  650 mg Oral Q6H PRN Emeterio Reeve, DO       Or   acetaminophen (TYLENOL) suppository 650 mg  650 mg Rectal Q6H PRN Emeterio Reeve, DO       albuterol (PROVENTIL) (2.5 MG/3ML) 0.083% nebulizer solution 2.5 mg  2.5 mg Nebulization Q6H PRN Emeterio Reeve, DO       bisacodyl (DULCOLAX) suppository 10 mg  10 mg Rectal Daily Emeterio Reeve, DO   10 mg at 03/31/22 0940   cefTRIAXone (ROCEPHIN) 2 g in sodium chloride 0.9 % 100 mL IVPB  2 g Intravenous Q24H Emeterio Reeve, DO 200 mL/hr at 03/31/22 0856 2 g at 03/31/22 0856   cholecalciferol (VITAMIN D3) 25 MCG (1000 UNIT) tablet 2,000 Units  2,000 Units Oral Daily Pokhrel, Laxman, MD   2,000 Units at 03/31/22 0919   cycloSPORINE (RESTASIS) 0.05 % ophthalmic emulsion 1 drop  1 drop Both Eyes BID Emeterio Reeve, DO   1 drop at 03/31/22  0857   enoxaparin (LOVENOX) injection 40 mg  40 mg Subcutaneous Q24H Emeterio Reeve, DO       famotidine (PEPCID) tablet 20 mg  20 mg Oral Daily Emeterio Reeve, DO   20 mg at 03/31/22 6812   fiber (NUTRISOURCE FIBER) 1 packet  1 packet Per Tube BID Emeterio Reeve, DO   1 packet at 03/31/22 7517   hydrALAZINE (APRESOLINE) injection 5 mg  5 mg Intravenous Q6H PRN Emeterio Reeve, DO       lactated ringers infusion   Intravenous Continuous Emeterio Reeve, DO 75 mL/hr at 03/31/22 1152 New Bag at 03/31/22 1152   losartan (COZAAR) tablet 100 mg  100 mg Oral Daily Emeterio Reeve, DO   100 mg at 03/31/22 0017   magnesium hydroxide (MILK OF  MAGNESIA) suspension 30 mL  30 mL Oral Daily PRN Pokhrel, Corrie Mckusick, MD       melatonin tablet 5 mg  5 mg Oral QHS Emeterio Reeve, DO   5 mg at 03/30/22 2106   metroNIDAZOLE (FLAGYL) IVPB 500 mg  500 mg Intravenous Q12H Emeterio Reeve, DO 100 mL/hr at 03/31/22 1152 500 mg at 03/31/22 1152   mometasone-formoterol (DULERA) 200-5 MCG/ACT inhaler 2 puff  2 puff Inhalation BID Emeterio Reeve, DO       multivitamin with minerals tablet 1 tablet  1 tablet Oral Daily Emeterio Reeve, DO   1 tablet at 03/31/22 0919   polyethylene glycol (MIRALAX / GLYCOLAX) packet 17 g  17 g Oral BID Emeterio Reeve, DO   17 g at 03/31/22 4944   polyvinyl alcohol (LIQUIFILM TEARS) 1.4 % ophthalmic solution 1 drop  1 drop Both Eyes BID Emeterio Reeve, DO   1 drop at 03/31/22 9675   QUEtiapine (SEROQUEL) tablet 25 mg  25 mg Oral QHS Emeterio Reeve, DO   25 mg at 03/30/22 2106   simethicone (MYLICON) chewable tablet 120 mg  120 mg Oral QID PRN Pokhrel, Laxman, MD       sodium phosphate (FLEET) 7-19 GM/118ML enema 1 enema  1 enema Rectal Once PRN Emeterio Reeve, DO       tiZANidine (ZANAFLEX) tablet 2 mg  2 mg Oral QHS Pokhrel, Laxman, MD   2 mg at 03/30/22 2106     Discharge Medications: Please see discharge summary for a list of discharge medications.  Relevant Imaging Results:  Relevant Lab Results:   Additional Information SS# [id number]  Lennart Pall, LCSW

## 2022-03-31 NOTE — Progress Notes (Signed)
PROGRESS NOTE    Leslie Farley  NWG:[id number] DOB: 06-24-1930 DOA: 03/29/2022 PCP: Merryl Hacker, No    Brief Narrative:  Leslie Farley is a 89 y.o female with past medical history of asthma, COPD, GERD, hypertension from skilled nursing facility who is minimally ambulatory at baseline presented to hospital with abdominal pain.  Patient had been having  constipation for the last 3 to 4 days and developed abdominal pain and distention.  Patient was given enema at the facility without any effect.  Patient also had decreased appetite and recently was treated for UTI.  In the ED, patient had mild elevation of lactate but WBC was significantly elevated at 24 K.  Flu/COVID-negative, blood cultures collected.  CT abdomen/pelvis concerning for potential pneumonia at lung bases, significant stool burden with stercoral colitis in the rectosigmoid colon, chest x-ray was nonacute.  Patient was then treated with suppository and enema.  EDP attempted disimpaction, minimally successful.  Patient also received soapsuds enema, minimal stool passed.  Patient did continue to have abdominal pain so patient was admitted hospital for further evaluation and treatment  Assessment and Plan   Stercoral colitis/Severe constipation/Fecal impaction without obstruction On clears, continue aggressive bowel regimen. On Rocephin, metro for stercoral colitis, will advance to full liquids as tolerated today.  Patient did have bowel movement yesterday.   Leukocytosis Significant WBC elevation on presentation at 24 K.  As trended down to 17.2 from 22.3 yesterday. Uncertain baseline.  CT abdomen showing basal pulmonary  infiltrates.  Continue antibiotics for now. Check urinalysis- pending.  Afebrile at 98.9 F.  Blood cultures negative in less than 12 hours.  Influenza, flu and RSV was negative.  Initial lactate was elevated at 2.0.  LFTs within normal limits.  Lipase within normal limits.  Mild hypokalemia.  Improved after replacement.   Latest potassium 3.7.   GERD (gastroesophageal reflux disease) Not on medications at home.   COPD (chronic obstructive pulmonary disease) (HCC) Continue bronchodilators as needed.  No acute exacerbation.  Essential hypertension CAD (coronary artery disease) of artery bypass graft On hydrochlorothiazide losartan at home.   Will continue to hold hydrochlorothiazide.  Cognitive impairment Mild cognitive impairment and confusion during the evening time.  Continue Seroquel qhs    DVT prophylaxis: enoxaparin (LOVENOX) injection 40 mg Start: 03/30/22 1000   Code Status:     Code Status: Full Code  Disposition: Skilled nursing facility likely on 04/01/2022 if she tolerates oral diet and continues to have regular bowel movements..  Status is: Inpatient  The patient is inpatient because: Ongoing abdominal pain, on clears, bowel impaction, significant leukocytosis.   Family Communication: None at bedside.  Spoke with the patient's daughter on the phone and updated her about the clinical condition of the patient.  Consultants:  None  Procedures:  None  Antimicrobials:  Rocephin and Flagyl 03/29/22>.  Anti-infectives (From admission, onward)    Start     Dose/Rate Route Frequency Ordered Stop   03/30/22 1000  cefTRIAXone (ROCEPHIN) 2 g in sodium chloride 0.9 % 100 mL IVPB        2 g 200 mL/hr over 30 Minutes Intravenous Every 24 hours 03/30/22 0059     03/30/22 0100  metroNIDAZOLE (FLAGYL) IVPB 500 mg        500 mg 100 mL/hr over 60 Minutes Intravenous Every 12 hours 03/29/22 2158     03/29/22 2200  ciprofloxacin (CIPRO) IVPB 200 mg  Status:  Discontinued        200 mg 100 mL/hr over 60  Minutes Intravenous Every 12 hours 03/29/22 2158 03/30/22 0059   03/29/22 2015  cefTRIAXone (ROCEPHIN) 1 g in sodium chloride 0.9 % 100 mL IVPB        1 g 200 mL/hr over 30 Minutes Intravenous  Once 03/29/22 2002 03/29/22 2225   03/29/22 2015  azithromycin (ZITHROMAX) 500 mg in sodium chloride 0.9  % 250 mL IVPB        500 mg 250 mL/hr over 60 Minutes Intravenous  Once 03/29/22 2002 03/30/22 0019      Subjective: Today, patient was seen and examined at bedside.  Patient denies any nausea, vomiting today.  On clears.  Has mild abdominal pain.  Used interpreter service.  Poor historian.  Objective: Vitals:   03/30/22 0950 03/30/22 1345 03/30/22 2055 03/31/22 0621  BP: (!) 163/82 (!) 163/66 (!) 157/70 (!) 142/66  Pulse: 81 76 83 73  Resp: 18 18 20 18   Temp: 97.8 F (36.6 C) 97.9 F (36.6 C) 98.9 F (37.2 C) 98.6 F (37 C)  TempSrc: Oral Oral Oral Oral  SpO2: 95% 98% 97% 98%  Weight:        Intake/Output Summary (Last 24 hours) at 03/31/2022 0745 Last data filed at 03/31/2022 0640 Gross per 24 hour  Intake 4375.32 ml  Output 825 ml  Net 3550.32 ml    Filed Weights   03/30/22 0000  Weight: 96.7 kg    Physical Examination: Body mass index is 35.48 kg/m.   General: Alert awake and Communicative.  Obese built.   HENT:   No scleral pallor or icterus noted. Oral mucosa is moist.  Chest:  Clear breath sounds.  Diminished breath sounds bilaterally. No crackles or wheezes.  CVS: S1 &S2 heard. No murmur.  Regular rate and rhythm. Abdomen: Soft, mildly distended with nonspecific tenderness. Extremities: No cyanosis, clubbing or edema.  Peripheral pulses are palpable. Psych: Alert, awake and oriented, normal mood CNS:  No cranial nerve deficits.  Power equal in all extremities.  Moves all extremities. Skin: Warm and dry.  No rashes noted.  Data Reviewed:   CBC: Recent Labs  Lab 03/29/22 1530 03/30/22 0418 03/31/22 0422  WBC 24.0* 22.3* 17.2*  NEUTROABS 21.5*  --   --   HGB 10.7* 9.9* 10.0*  HCT 33.4* 30.2* 30.9*  MCV 98.2 95.9 98.4  PLT 433* 360 322     Basic Metabolic Panel: Recent Labs  Lab 03/29/22 1530 03/30/22 0418 03/31/22 0422  NA 135 133* 135  K 3.6 3.4* 3.7  CL 97* 96* 101  CO2 26 25 26   GLUCOSE 166* 136* 133*  BUN 22 21 26*  CREATININE 0.77  0.73 0.69  CALCIUM 9.2 9.2 9.1  MG  --   --  2.6*     Liver Function Tests: Recent Labs  Lab 03/29/22 1530  AST 28  ALT 20  ALKPHOS 53  BILITOT 1.0  PROT 7.4  ALBUMIN 3.4*      Radiology Studies: DG Chest Port 1 View  Result Date: 03/29/2022 CLINICAL DATA:  Cough.  Tender abdomen. EXAM: PORTABLE CHEST 1 VIEW COMPARISON:  10/30/2014 and overlapping portions CT abdomen 03/29/2022. FINDINGS: The retro diaphragmatic bandlike densities shown on CT from earlier today are not readily apparent on portable chest radiography. The visualized portions of the lungs appear clear. Thoracic spondylosis. Atherosclerotic calcification of the aortic arch. Heart size within normal limits. Severe right and moderate to severe left degenerative glenohumeral arthropathy. IMPRESSION: 1. No acute findings. 2. Thoracic spondylosis. 3. Severe right and moderate  to severe left degenerative glenohumeral arthropathy. 4.  Aortic Atherosclerosis (ICD10-I70.0). Electronically Signed   By: Van Clines M.D.   On: 03/29/2022 18:34   CT Abdomen Pelvis W Contrast  Result Date: 03/29/2022 CLINICAL DATA:  Acute nonlocalized abdominal pain EXAM: CT ABDOMEN AND PELVIS WITH CONTRAST TECHNIQUE: Multidetector CT imaging of the abdomen and pelvis was performed using the standard protocol following bolus administration of intravenous contrast. RADIATION DOSE REDUCTION: This exam was performed according to the departmental dose-optimization program which includes automated exposure control, adjustment of the mA and/or kV according to patient size and/or use of iterative reconstruction technique. CONTRAST:  168mL OMNIPAQUE IOHEXOL 300 MG/ML  SOLN COMPARISON:  None Available. FINDINGS: Lower chest: Patchy infiltrates in the lung bases may indicate pneumonia. Small esophageal hiatal hernia. Hepatobiliary: No focal liver abnormality is seen. Status post cholecystectomy. No biliary dilatation. Pancreas: Unremarkable. No pancreatic ductal  dilatation or surrounding inflammatory changes. Spleen: Normal in size without focal abnormality. Adrenals/Urinary Tract: Adrenal glands are unremarkable. Kidneys are normal, without renal calculi, focal lesion, or hydronephrosis. Bladder is unremarkable. Stomach/Bowel: Stomach is decompressed. Scattered gas and fluid in the small bowel without abnormal distention. Large amount of stool demonstrated in the rectosigmoid colon with rectosigmoid colonic wall thickening and stranding in the surrounding fat suggesting stercoral colitis. Scattered colonic diverticula without evidence of acute diverticulitis. Liquid stool is demonstrated in the right colon. Appendix is normal. Vascular/Lymphatic: Aortic atherosclerosis. No enlarged abdominal or pelvic lymph nodes. Reproductive: Uterus and bilateral adnexa are unremarkable. Other: No free air or free fluid in the abdomen. Mesenteric stranding is likely reactive edema. Broad-based anterior abdominal wall hernia along the midline containing a portion of the colon but without proximal obstruction. Calcified granulomas in the subcutaneous soft tissues over the gluteal region consistent with injection granulomas. Musculoskeletal: Degenerative changes in the spine and hips. IMPRESSION: 1. Patchy infiltrates in the lung bases, possibly pneumonia. 2. Large amount of stool in the rectosigmoid colon with associated wall thickening and fat stranding suggesting stercoral colitis. 3. Small esophageal hiatal hernia. 4. Aortic atherosclerosis. Electronically Signed   By: Lucienne Capers M.D.   On: 03/29/2022 17:59      LOS: 1 day    Flora Lipps, MD [facility] Available via Epic secure chat 7am-7pm After these hours, please refer to coverage provider listed on [email or web address] 03/31/2022, 7:45 AM

## 2022-04-01 ENCOUNTER — Inpatient Hospital Stay (HOSPITAL_COMMUNITY): Payer: Medicare Other

## 2022-04-01 DIAGNOSIS — I25708 Atherosclerosis of coronary artery bypass graft(s), unspecified, with other forms of angina pectoris: Secondary | ICD-10-CM | POA: Diagnosis not present

## 2022-04-01 DIAGNOSIS — R4189 Other symptoms and signs involving cognitive functions and awareness: Secondary | ICD-10-CM | POA: Diagnosis not present

## 2022-04-01 DIAGNOSIS — K5289 Other specified noninfective gastroenteritis and colitis: Secondary | ICD-10-CM | POA: Diagnosis not present

## 2022-04-01 DIAGNOSIS — J410 Simple chronic bronchitis: Secondary | ICD-10-CM | POA: Diagnosis not present

## 2022-04-01 LAB — CBC
HCT: 31.9 % — ABNORMAL LOW (ref 36.0–46.0)
Hemoglobin: 9.8 g/dL — ABNORMAL LOW (ref 12.0–15.0)
MCH: 31.5 pg (ref 26.0–34.0)
MCHC: 30.7 g/dL (ref 30.0–36.0)
MCV: 102.6 fL — ABNORMAL HIGH (ref 80.0–100.0)
Platelets: 310 10*3/uL (ref 150–400)
RBC: 3.11 MIL/uL — ABNORMAL LOW (ref 3.87–5.11)
RDW: 14.4 % (ref 11.5–15.5)
WBC: 10.7 10*3/uL — ABNORMAL HIGH (ref 4.0–10.5)
nRBC: 0 % (ref 0.0–0.2)

## 2022-04-01 LAB — BASIC METABOLIC PANEL
Anion gap: 10 (ref 5–15)
BUN: 22 mg/dL (ref 8–23)
CO2: 21 mmol/L — ABNORMAL LOW (ref 22–32)
Calcium: 8.7 mg/dL — ABNORMAL LOW (ref 8.9–10.3)
Chloride: 103 mmol/L (ref 98–111)
Creatinine, Ser: 0.78 mg/dL (ref 0.44–1.00)
GFR, Estimated: >60 mL/min (ref >60)
Glucose, Bld: 98 mg/dL (ref 70–99)
Potassium: 3.4 mmol/L — ABNORMAL LOW (ref 3.5–5.1)
Sodium: 134 mmol/L — ABNORMAL LOW (ref 135–145)

## 2022-04-01 LAB — CULTURE, BLOOD (ROUTINE X 2): Culture: NO GROWTH

## 2022-04-01 LAB — MAGNESIUM: Magnesium: 2.3 mg/dL (ref 1.7–2.4)

## 2022-04-01 MED ORDER — TIZANIDINE HCL 2 MG PO TABS: 2.0000 mg | ORAL_TABLET | Freq: Every day | ORAL | 0 refills | Status: DC

## 2022-04-01 MED ORDER — FAMOTIDINE 20 MG PO TABS: 20.0000 mg | ORAL_TABLET | Freq: Every day | ORAL | Status: DC

## 2022-04-01 MED ORDER — METRONIDAZOLE 500 MG PO TABS: 500.0000 mg | ORAL_TABLET | Freq: Three times a day (TID) | ORAL | 0 refills | Status: AC

## 2022-04-01 MED ORDER — CIPROFLOXACIN HCL 500 MG PO TABS: 500.0000 mg | ORAL_TABLET | Freq: Two times a day (BID) | ORAL | 0 refills | Status: AC

## 2022-04-01 MED ORDER — ALBUTEROL SULFATE HFA 108 (90 BASE) MCG/ACT IN AERS: 2.0000 | INHALATION_SPRAY | Freq: Four times a day (QID) | RESPIRATORY_TRACT | Status: DC | PRN

## 2022-04-01 MED ORDER — POTASSIUM CHLORIDE CRYS ER 20 MEQ PO TBCR: 20.0000 meq | EXTENDED_RELEASE_TABLET | Freq: Every day | ORAL | 0 refills | Status: DC

## 2022-04-01 NOTE — TOC Transition Note (Signed)
Transition of Care [facility]) - CM/SW Discharge Note   Patient Details  Name: Leslie Farley MRN: [id number] Date of Birth: 1930-09-29  [facility] CM/SW Contact:  Lennart Pall, LCSW Phone Number: 04/01/2022, 1:52 PM   Clinical Narrative:     Pt has been medically cleared for return to LTC bed at [facility] today.  Facility aware as well as daughter and all agreeable.  PTAR called at 1:45pm.  RN to call report to [phone].  No further TOC needs.  Final next level of care: Long Term Nursing Home Barriers to Discharge: Barriers Resolved   Patient Goals and CMS Choice      Discharge Placement     Existing PASRR number confirmed : 03/31/22          Patient chooses bed at: [facility] Patient to be transferred to facility by: [facility] Name of family member notified: daughter, Sabino Snipes Patient and family notified of of transfer: 04/01/22  Discharge Plan and Services Additional resources added to the After Visit Summary for   In-house Referral: Clinical Social Work   Post Acute Care Choice: Nursing Home          DME Arranged: N/A DME Agency: NA                  Social Determinants of Health (SDOH) Interventions SDOH Screenings   Tobacco Use: Medium Risk (03/29/2022)     Readmission Risk Interventions    03/31/2022    1:08 PM  Readmission Risk Prevention Plan  Post Dischage Appt Complete  Medication Screening Complete  Transportation Screening Complete

## 2022-04-01 NOTE — Discharge Summary (Signed)
Physician Discharge Summary  Leslie Farley ALP:[id number] DOB: 01/26/1931 DOA: 03/29/2022  PCP: Pcp, No  Admit date: 03/29/2022 Discharge date: 04/01/2022  Admitted: SNF  Discharge disposition: SNF   Recommendations for Outpatient Follow-Up:   Follow up with your primary care provider at the skilled nursing facility in 3 to 5 days. Check CBC, BMP, magnesium in the next visit Patient should be ensured regular bowel movements with adequate bowel regimen to prevent impaction.  Discharge Diagnosis:   Principal Problem:   Stercoral colitis Active Problems:   Hypertension   GERD (gastroesophageal reflux disease)   COPD (chronic obstructive pulmonary disease) (HCC)   Chronic constipation   CAD (coronary artery disease) of artery bypass graft   Fecal impaction (HCC)   Cognitive impairment   Discharge Condition: Improved.  Diet recommendation: Regular  Wound care: None.  Code status: Full.   History of Present Illness:   Leslie Farley is a 89 y.o female with past medical history of asthma, COPD, GERD, hypertension from skilled nursing facility who is minimally ambulatory at baseline presented to hospital with abdominal pain.  Patient had been having  constipation for the last 3 to 4 days and developed abdominal pain and distention.  Patient was given enema at the facility without any effect.  Patient also had decreased appetite and recently was treated for UTI.  In the ED, patient had mild elevation of lactate but WBC was significantly elevated at 24 K.  Flu/COVID-negative, blood cultures collected.  CT abdomen/pelvis concerning for potential pneumonia at lung bases, significant stool burden with stercoral colitis in the rectosigmoid colon, chest x-ray was nonacute.  Patient was then treated with suppository and enema.  EDP attempted disimpaction, minimally successful.  Patient also received soapsuds enema, minimal stool passed.  Patient did continue to have abdominal pain so  patient was admitted hospital for further evaluation and treatment    Hospital Course:   Following conditions were addressed during hospitalization as listed below,  Stercoral colitis/Severe constipation/Fecal impaction without obstruction Patient was treated conservatively initially with IV fluids clears and has been advanced to solid diet at this time.  Patient was treated with aggressive bowel regimen in the hospital with adequate bowel movements.  She was treated empirically with Rocephin and Metro for  stercoral colitis, and will continue Cipro and Metro for next 3 days on discharge.  Leukocytosis Significant WBC elevation on presentation at 24 K.  Leukocytosis has trended down to 10.7 today.  Received IV fluids.  CT abdomen showing basal pulmonary  infiltrates but no pulmonary symptoms..  Blood cultures negative in 3 days.  Influenza, flu and RSV was negative.  Initial lactate was elevated at 2.0.  LFTs within normal limits.  Lipase within normal limits.   Mild hypokalemia.  Improved after replacement.  Latest potassium 3.4.  Will continue to replenish for next 3 to 4 days on discharge.   GERD (gastroesophageal reflux disease) Continue Pepcid on discharge  COPD (chronic obstructive pulmonary disease) (HCC) Continue Advair Diskus and albuterol inhaler on discharge.   Essential hypertension CAD (coronary artery disease) of artery bypass graft On hydrochlorothiazide losartan at home.   Will continue on discharge   Cognitive impairment Mild cognitive impairment and confusion during the evening time.  Continue Seroquel qhs    Disposition.  At this time, patient is stable for disposition to skilled nursing facility.  Medical Consultants:   None.  Procedures:    None Subjective:   Today, patient was seen and examined at bedside.  Communicated with  the help of [place] interpreter.  Patient denies any nausea, vomiting or abdominal pain.  Has had good bowel movements.  Has been  very hungry and eating lunch.  Discharge Exam:   Vitals:   03/31/22 2150 04/01/22 0543  BP: (!) 152/64 (!) 142/71  Pulse: 86 72  Resp: 18 18  Temp: 98.4 F (36.9 C) 98.3 F (36.8 C)  SpO2: 98% 100%   Vitals:   03/31/22 1350 03/31/22 1957 03/31/22 2150 04/01/22 0543  BP: (!) 161/82  (!) 152/64 (!) 142/71  Pulse: 82  86 72  Resp: 18  18 18   Temp: 98.8 F (37.1 C)  98.4 F (36.9 C) 98.3 F (36.8 C)  TempSrc: Oral  Oral Oral  SpO2: 99% 96% 98% 100%  Weight:        General: Alert awake, not in obvious distress, obese built.  HENT: pupils equally reacting to light,  No scleral pallor or icterus noted. Oral mucosa is moist.  Chest:  Clear breath sounds.  Diminished breath sounds bilaterally. No crackles or wheezes.  CVS: S1 &S2 heard. No murmur.  Regular rate and rhythm. Abdomen: Soft, nonspecific tenderness noted,  Bowel sounds are heard.   Extremities: No cyanosis, clubbing or edema.  Peripheral pulses are palpable. Psych: Alert, awake and oriented, normal mood CNS:  No cranial nerve deficits.  Power equal in Farley extremities.   Skin: Warm and dry.  No rashes noted.  The results of significant diagnostics from this hospitalization (including imaging, microbiology, ancillary and laboratory) are listed below for reference.     Diagnostic Studies:   DG Chest Port 1 View  Result Date: 03/29/2022 CLINICAL DATA:  Cough.  Tender abdomen. EXAM: PORTABLE CHEST 1 VIEW COMPARISON:  10/30/2014 and overlapping portions CT abdomen 03/29/2022. FINDINGS: The retro diaphragmatic bandlike densities shown on CT from earlier today are not readily apparent on portable chest radiography. The visualized portions of the lungs appear clear. Thoracic spondylosis. Atherosclerotic calcification of the aortic arch. Heart size within normal limits. Severe right and moderate to severe left degenerative glenohumeral arthropathy. IMPRESSION: 1. No acute findings. 2. Thoracic spondylosis. 3. Severe right and  moderate to severe left degenerative glenohumeral arthropathy. 4.  Aortic Atherosclerosis (ICD10-I70.0). Electronically Signed   By: 05/28/2022 M.D.   On: 03/29/2022 18:34   CT Abdomen Pelvis W Contrast  Result Date: 03/29/2022 CLINICAL DATA:  Acute nonlocalized abdominal pain EXAM: CT ABDOMEN AND PELVIS WITH CONTRAST TECHNIQUE: Multidetector CT imaging of the abdomen and pelvis was performed using the standard protocol following bolus administration of intravenous contrast. RADIATION DOSE REDUCTION: This exam was performed according to the departmental dose-optimization program which includes automated exposure control, adjustment of the mA and/or kV according to patient size and/or use of iterative reconstruction technique. CONTRAST:  05/28/2022 OMNIPAQUE IOHEXOL 300 MG/ML  SOLN COMPARISON:  None Available. FINDINGS: Lower chest: Patchy infiltrates in the lung bases may indicate pneumonia. Small esophageal hiatal hernia. Hepatobiliary: No focal liver abnormality is seen. Status post cholecystectomy. No biliary dilatation. Pancreas: Unremarkable. No pancreatic ductal dilatation or surrounding inflammatory changes. Spleen: Normal in size without focal abnormality. Adrenals/Urinary Tract: Adrenal glands are unremarkable. Kidneys are normal, without renal calculi, focal lesion, or hydronephrosis. Bladder is unremarkable. Stomach/Bowel: Stomach is decompressed. Scattered gas and fluid in the small bowel without abnormal distention. Large amount of stool demonstrated in the rectosigmoid colon with rectosigmoid colonic wall thickening and stranding in the surrounding fat suggesting stercoral colitis. Scattered colonic diverticula without evidence of acute diverticulitis. Liquid stool is  demonstrated in the right colon. Appendix is normal. Vascular/Lymphatic: Aortic atherosclerosis. No enlarged abdominal or pelvic lymph nodes. Reproductive: Uterus and bilateral adnexa are unremarkable. Other: No free air or free  fluid in the abdomen. Mesenteric stranding is likely reactive edema. Broad-based anterior abdominal wall hernia along the midline containing a portion of the colon but without proximal obstruction. Calcified granulomas in the subcutaneous soft tissues over the gluteal region consistent with injection granulomas. Musculoskeletal: Degenerative changes in the spine and hips. IMPRESSION: 1. Patchy infiltrates in the lung bases, possibly pneumonia. 2. Large amount of stool in the rectosigmoid colon with associated wall thickening and fat stranding suggesting stercoral colitis. 3. Small esophageal hiatal hernia. 4. Aortic atherosclerosis. Electronically Signed   By: Lucienne Capers M.D.   On: 03/29/2022 17:59     Labs:   Basic Metabolic Panel: Recent Labs  Lab 03/29/22 1530 03/30/22 0418 03/31/22 0422 04/01/22 0454  NA 135 133* 135 134*  K 3.6 3.4* 3.7 3.4*  CL 97* 96* 101 103  CO2 26 25 26  21*  GLUCOSE 166* 136* 133* 98  BUN 22 21 26* 22  CREATININE 0.77 0.73 0.69 0.78  CALCIUM 9.2 9.2 9.1 8.7*  MG  --   --  2.6* 2.3   GFR CrCl cannot be calculated (Unknown ideal weight.). Liver Function Tests: Recent Labs  Lab 03/29/22 1530  AST 28  ALT 20  ALKPHOS 53  BILITOT 1.0  PROT 7.4  ALBUMIN 3.4*   Recent Labs  Lab 03/29/22 1530  LIPASE 31   No results for input(s): "AMMONIA" in the last 168 hours. Coagulation profile No results for input(s): "INR", "PROTIME" in the last 168 hours.  CBC: Recent Labs  Lab 03/29/22 1530 03/30/22 0418 03/31/22 0422 04/01/22 0454  WBC 24.0* 22.3* 17.2* 10.7*  NEUTROABS 21.5*  --   --   --   HGB 10.7* 9.9* 10.0* 9.8*  HCT 33.4* 30.2* 30.9* 31.9*  MCV 98.2 95.9 98.4 102.6*  PLT 433* 360 322 310   Cardiac Enzymes: No results for input(s): "CKTOTAL", "CKMB", "CKMBINDEX", "TROPONINI" in the last 168 hours. BNP: Invalid input(s): "POCBNP" CBG: No results for input(s): "GLUCAP" in the last 168 hours. D-Dimer No results for input(s):  "DDIMER" in the last 72 hours. Hgb A1c No results for input(s): "HGBA1C" in the last 72 hours. Lipid Profile No results for input(s): "CHOL", "HDL", "LDLCALC", "TRIG", "CHOLHDL", "LDLDIRECT" in the last 72 hours. Thyroid function studies No results for input(s): "TSH", "T4TOTAL", "T3FREE", "THYROIDAB" in the last 72 hours.  Invalid input(s): "FREET3" Anemia work up No results for input(s): "VITAMINB12", "FOLATE", "FERRITIN", "TIBC", "IRON", "RETICCTPCT" in the last 72 hours. Microbiology Recent Results (from the past 240 hour(s))  Resp panel by RT-PCR (RSV, Flu A&B, Covid) Anterior Nasal Swab     Status: None   Collection Time: 03/29/22  6:40 PM   Specimen: Anterior Nasal Swab  Result Value Ref Range Status   SARS Coronavirus 2 by RT PCR NEGATIVE NEGATIVE Final    Comment: (NOTE) SARS-CoV-2 target nucleic acids are NOT DETECTED.  The SARS-CoV-2 RNA is generally detectable in upper respiratory specimens during the acute phase of infection. The lowest concentration of SARS-CoV-2 viral copies this assay can detect is 138 copies/mL. A negative result does not preclude SARS-Cov-2 infection and should not be used as the sole basis for treatment or other patient management decisions. A negative result may occur with  improper specimen collection/handling, submission of specimen other than nasopharyngeal swab, presence of viral mutation(s) within  the areas targeted by this assay, and inadequate number of viral copies(<138 copies/mL). A negative result must be combined with clinical observations, patient history, and epidemiological information. The expected result is Negative.  Fact Sheet for Patients:  BloggerCourse.com  Fact Sheet for Healthcare Providers:  SeriousBroker.it  This test is no t yet approved or cleared by the [place] FDA and  has been authorized for detection and/or diagnosis of SARS-CoV-2 by FDA under an  Emergency Use Authorization (EUA). This EUA will remain  in effect (meaning this test can be used) for the duration of the COVID-19 declaration under Section 564(b)(1) of the Act, 21 U.S.C.section 360bbb-3(b)(1), unless the authorization is terminated  or revoked sooner.       Influenza A by PCR NEGATIVE NEGATIVE Final   Influenza B by PCR NEGATIVE NEGATIVE Final    Comment: (NOTE) The Xpert Xpress SARS-CoV-2/FLU/RSV plus assay is intended as an aid in the diagnosis of influenza from Nasopharyngeal swab specimens and should not be used as a sole basis for treatment. Nasal washings and aspirates are unacceptable for Xpert Xpress SARS-CoV-2/FLU/RSV testing.  Fact Sheet for Patients: BloggerCourse.com  Fact Sheet for Healthcare Providers: SeriousBroker.it  This test is not yet approved or cleared by the [place] FDA and has been authorized for detection and/or diagnosis of SARS-CoV-2 by FDA under an Emergency Use Authorization (EUA). This EUA will remain in effect (meaning this test can be used) for the duration of the COVID-19 declaration under Section 564(b)(1) of the Act, 21 U.S.C. section 360bbb-3(b)(1), unless the authorization is terminated or revoked.     Resp Syncytial Virus by PCR NEGATIVE NEGATIVE Final    Comment: (NOTE) Fact Sheet for Patients: BloggerCourse.com  Fact Sheet for Healthcare Providers: SeriousBroker.it  This test is not yet approved or cleared by the [place] FDA and has been authorized for detection and/or diagnosis of SARS-CoV-2 by FDA under an Emergency Use Authorization (EUA). This EUA will remain in effect (meaning this test can be used) for the duration of the COVID-19 declaration under Section 564(b)(1) of the Act, 21 U.S.C. section 360bbb-3(b)(1), unless the authorization is terminated or revoked.  Performed at [facility], [address], [place], [place] [address]   Blood culture (routine x 2)     Status: None (Preliminary result)   Collection Time: 03/29/22  6:40 PM   Specimen: BLOOD RIGHT HAND  Result Value Ref Range Status   Specimen Description   Final    BLOOD RIGHT HAND Performed at [facility], [address], [place], [place] [address]    Special Requests   Final    BOTTLES DRAWN AEROBIC AND ANAEROBIC Blood Culture results may not be optimal due to an inadequate volume of blood received in culture bottles Performed at [facility] [place] [facility], [address] [place], [place] [address]    Culture   Final    NO GROWTH 3 DAYS Performed at [facility], [address], [place], [place] [address]    Report Status PENDING  Incomplete  Blood culture (routine x 2)     Status: None (Preliminary result)   Collection Time: 03/29/22  6:40 PM   Specimen: BLOOD  Result Value Ref Range Status   Specimen Description   Final    BLOOD LEFT ANTECUBITAL Performed at [facility], [address], [place], [place] [address]    Special Requests   Final    BOTTLES DRAWN AEROBIC ONLY Blood Culture  adequate volume Performed at [facility] [address] [place], [place] [address]    Culture   Final    NO GROWTH 3 DAYS Performed at [facility], [place] [address], [place], [place] [address]    Report Status PENDING  Incomplete     Discharge Instructions:   Discharge Instructions     Call MD for:  severe uncontrolled pain   Complete by: As directed    Diet general   Complete by: As directed    Discharge instructions   Complete by: As directed    Follow up with your primary care provider at the SNF in 3-5 days. Ensure daily bowel movements, take medications for bowel movements regularly.   Increase activity slowly   Complete by: As directed       Allergies as of 04/01/2022       Reactions   Cucumber Extract          Medication List     STOP taking these medications    ranitidine 150 MG tablet Commonly known as: ZANTAC Replaced by: famotidine 20 MG tablet   traMADol 50 MG tablet Commonly known as: ULTRAM       TAKE these medications    acetaminophen 650 MG CR tablet Commonly known as: TYLENOL Take 650 mg by mouth in the morning and at bedtime.   acetaminophen 500 MG tablet Commonly known as: TYLENOL Take 2 tablets (1,000 mg total) by mouth every 6 (six) hours as needed for mild pain or moderate pain.   Advair Diskus 100-50 MCG/ACT Aepb Generic drug: fluticasone-salmeterol Inhale 1 puff into the lungs every 12 (twelve) hours.   albuterol 108 (90 Base) MCG/ACT inhaler Commonly known as: VENTOLIN HFA Inhale 2 puffs into the lungs every 6 (six) hours as needed for wheezing or shortness of breath.   bisacodyl 10 MG suppository Commonly known as: DULCOLAX Place 10 mg rectally daily as needed for moderate constipation.   CERTAVITE/ANTIOXIDANTS PO Take 1 tablet by mouth daily.   cholecalciferol 25 MCG (1000 UNIT) tablet Commonly known as: VITAMIN D3 Take 2,000 Units by mouth daily.   ciprofloxacin 500 MG tablet Commonly known as: Cipro Take 1 tablet (500 mg total) by mouth 2 (two) times daily for 3 days.   cycloSPORINE 0.05 % ophthalmic emulsion Commonly known as: RESTASIS Place 1 drop into both eyes 2 (two) times daily.   famotidine 20 MG tablet Commonly known as: PEPCID Take 1 tablet (20 mg total) by mouth daily. Start taking on: April 02, 2022 Replaces: ranitidine 150 MG tablet   FP FIBER LAXATIVE PO Take 500 mg by mouth 2 (two) times daily.   hydrochlorothiazide 12.5 MG capsule Commonly known as: MICROZIDE Take 12.5 mg by mouth daily.   losartan 100 MG tablet Commonly known as: COZAAR Take 100 mg by mouth daily.   magnesium hydroxide 400 MG/5ML suspension Commonly known as: MILK OF MAGNESIA Take 30 mLs by mouth daily as needed for mild constipation.    melatonin 3 MG Tabs tablet Take 6 mg by mouth at bedtime.   metroNIDAZOLE 500 MG tablet Commonly known as: Flagyl Take 1 tablet (500 mg total) by mouth 3 (three) times daily for 3 days.   nitroGLYCERIN 0.4 MG SL tablet Commonly known as: NITROSTAT Place 0.4 mg under the tongue every 5 (five) minutes as needed for chest pain (Do not exceed 3 doses per episode.).   polyethylene glycol 17 g packet Commonly known as: MIRALAX / GLYCOLAX Take 17  g by mouth daily.   potassium chloride SA 20 MEQ tablet Commonly known as: KLOR-CON M Take 1 tablet (20 mEq total) by mouth daily for 5 days.   QUEtiapine 25 MG tablet Commonly known as: SEROQUEL Take 25 mg by mouth at bedtime.   RA SALINE ENEMA RE Place 1 Bottle rectally daily as needed (constipation).   senna-docusate 8.6-50 MG tablet Commonly known as: Senokot-S Take 2 tablets by mouth at bedtime.   simethicone 125 MG chewable tablet Commonly known as: MYLICON Chew 456 mg by mouth in the morning, at noon, and at bedtime.   Systane Balance 0.6 % Soln Generic drug: Propylene Glycol Place 1 drop into both eyes in the morning and at bedtime.   tiZANidine 2 MG tablet Commonly known as: ZANAFLEX Take 1 tablet (2 mg total) by mouth at bedtime. After completing cipro Start taking on: April 06, 2022 What changed:  additional instructions These instructions start on April 06, 2022. If you are unsure what to do until then, ask your doctor or other care provider.          Time coordinating discharge: 39 minutes  Signed:  Akeya Ryther  [facility] 04/01/2022, 1:09 PM

## 2022-04-01 NOTE — Progress Notes (Signed)
Patient was picked up by [facility].  Patient was going back to [facility]. Patient was stable for discharge, and the packet was given to [facility]. Report was given to the patient's nurse at [facility]. Prior to discharge.

## 2022-04-01 NOTE — Progress Notes (Signed)
Beside report: Pt was admitted with chief complaint (constipation). Nurse reported Pt had not had a BM in three weeks; however, Pt had several Bms over the course of the night. Pt was sleep with no signs of distress. Pt remained sleep with no signs of distress between 0730 til 1100; 2qh rounding was done. 1100 Pt assessment was performed; Pt reported no pain and showed no signs of distress. Pt is alert and oriented 3x, vision and auditory function are normal, Pt PERRLA. Pt skin is dry and intact with no visible signs of lesions or breakdown. Pt appear symmetrical. Pt upper extremities are moderate/weak and lower extremities are weak. Pt is total care and require two person assistance with ADL care. Pt primary language is [place] and requires translation services when providing care and medications. Pt capillary refill was 3 seconds, turgor. Pt was warm to the touch, no visible signs of swelling or edema. Pt lung sounds were wheezing and swooshing in left and right lobes. S1 and S2 sounds are present in heart, with no sounds of gallops, murmurs or clicks. Pt is incontinent of bowel and bladder; Pt has a peri wick (external catheter). Bowel sounds were heard in LUQ and LLQ of abdomen, but absent in RUQ and RLQ. Pt started the shift with full liquid diet and has progressed to a soft diet. Pt tolerated medications well when given. Well continue to monitor Pt status

## 2022-04-02 LAB — CULTURE, BLOOD (ROUTINE X 2): Special Requests: ADEQUATE

## 2022-07-21 ENCOUNTER — Emergency Department (HOSPITAL_COMMUNITY): Payer: Medicare Other

## 2022-07-21 ENCOUNTER — Other Ambulatory Visit: Payer: Self-pay

## 2022-07-21 ENCOUNTER — Emergency Department (HOSPITAL_COMMUNITY)
Admission: EM | Admit: 2022-07-21 | Discharge: 2022-07-21 | Disposition: A | Discharge status: Home / Self Care | Payer: Medicare Other | Attending: Emergency Medicine | Admitting: Emergency Medicine

## 2022-07-21 DIAGNOSIS — R404 Transient alteration of awareness: Secondary | ICD-10-CM | POA: Insufficient documentation

## 2022-07-21 DIAGNOSIS — Z1152 Encounter for screening for COVID-19: Secondary | ICD-10-CM | POA: Insufficient documentation

## 2022-07-21 DIAGNOSIS — R410 Disorientation, unspecified: Secondary | ICD-10-CM | POA: Diagnosis not present

## 2022-07-21 DIAGNOSIS — J45909 Unspecified asthma, uncomplicated: Secondary | ICD-10-CM | POA: Insufficient documentation

## 2022-07-21 DIAGNOSIS — N3000 Acute cystitis without hematuria: Secondary | ICD-10-CM | POA: Diagnosis not present

## 2022-07-21 DIAGNOSIS — J449 Chronic obstructive pulmonary disease, unspecified: Secondary | ICD-10-CM | POA: Diagnosis not present

## 2022-07-21 DIAGNOSIS — I10 Essential (primary) hypertension: Secondary | ICD-10-CM | POA: Insufficient documentation

## 2022-07-21 DIAGNOSIS — R4182 Altered mental status, unspecified: Secondary | ICD-10-CM | POA: Diagnosis present

## 2022-07-21 LAB — COMPREHENSIVE METABOLIC PANEL
ALT: 13 U/L (ref 0–44)
AST: 24 U/L (ref 15–41)
Albumin: 3.7 g/dL (ref 3.5–5.0)
Alkaline Phosphatase: 50 U/L (ref 38–126)
Anion gap: 8 (ref 5–15)
BUN: 16 mg/dL (ref 8–23)
CO2: 28 mmol/L (ref 22–32)
Calcium: 9.5 mg/dL (ref 8.9–10.3)
Chloride: 105 mmol/L (ref 98–111)
Creatinine, Ser: 0.9 mg/dL (ref 0.44–1.00)
GFR, Estimated: >60 mL/min (ref >60)
Glucose, Bld: 134 mg/dL — ABNORMAL HIGH (ref 70–99)
Potassium: 3 mmol/L — ABNORMAL LOW (ref 3.5–5.1)
Sodium: 141 mmol/L (ref 135–145)
Total Bilirubin: 0.5 mg/dL (ref 0.3–1.2)
Total Protein: 7.2 g/dL (ref 6.5–8.1)

## 2022-07-21 LAB — CBC WITH DIFFERENTIAL/PLATELET
Abs Immature Granulocytes: 0.01 10*3/uL (ref 0.00–0.07)
Basophils Absolute: 0 10*3/uL (ref 0.0–0.1)
Basophils Relative: 0 %
Eosinophils Absolute: 0.4 10*3/uL (ref 0.0–0.5)
Eosinophils Relative: 5 %
HCT: 32.2 % — ABNORMAL LOW (ref 36.0–46.0)
Hemoglobin: 10.5 g/dL — ABNORMAL LOW (ref 12.0–15.0)
Immature Granulocytes: 0 %
Lymphocytes Relative: 34 %
Lymphs Abs: 2.3 10*3/uL (ref 0.7–4.0)
MCH: 31.3 pg (ref 26.0–34.0)
MCHC: 32.6 g/dL (ref 30.0–36.0)
MCV: 96.1 fL (ref 80.0–100.0)
Monocytes Absolute: 0.7 10*3/uL (ref 0.1–1.0)
Monocytes Relative: 11 %
Neutro Abs: 3.3 10*3/uL (ref 1.7–7.7)
Neutrophils Relative %: 50 %
Platelets: 261 10*3/uL (ref 150–400)
RBC: 3.35 MIL/uL — ABNORMAL LOW (ref 3.87–5.11)
RDW: 14.1 % (ref 11.5–15.5)
WBC: 6.7 10*3/uL (ref 4.0–10.5)
nRBC: 0 % (ref 0.0–0.2)

## 2022-07-21 LAB — URINALYSIS, W/ REFLEX TO CULTURE (INFECTION SUSPECTED)
Bilirubin Urine: NEGATIVE
Glucose, UA: NEGATIVE mg/dL
Hgb urine dipstick: NEGATIVE
Ketones, ur: NEGATIVE mg/dL
Nitrite: POSITIVE — AB
Protein, ur: NEGATIVE mg/dL
Specific Gravity, Urine: 1.02 (ref 1.005–1.030)
WBC, UA: >50 WBC/hpf (ref 0–5)
pH: 5 (ref 5.0–8.0)

## 2022-07-21 LAB — TROPONIN I (HIGH SENSITIVITY): Troponin I (High Sensitivity): 13 ng/L (ref <18)

## 2022-07-21 LAB — SARS CORONAVIRUS 2 BY RT PCR: SARS Coronavirus 2 by RT PCR: NEGATIVE

## 2022-07-21 LAB — LIPASE, BLOOD: Lipase: 34 U/L (ref 11–51)

## 2022-07-21 MED ORDER — CEPHALEXIN 500 MG PO CAPS: 500.0000 mg | ORAL_CAPSULE | Freq: Four times a day (QID) | ORAL | 0 refills | Status: DC

## 2022-07-21 MED ORDER — SODIUM CHLORIDE 0.9 % IV SOLN
1.0000 g | Freq: Once | INTRAVENOUS | Status: AC
  Administered 2022-07-21: 1 g via INTRAVENOUS
  Filled 2022-07-21: qty 10

## 2022-07-21 MED ORDER — CEPHALEXIN 500 MG PO CAPS: 500.0000 mg | ORAL_CAPSULE | Freq: Four times a day (QID) | ORAL | 0 refills | Status: AC

## 2022-07-21 MED ORDER — SODIUM CHLORIDE 0.9 % IV BOLUS
500.0000 mL | Freq: Once | INTRAVENOUS | Status: AC
  Administered 2022-07-21: 500 mL via INTRAVENOUS

## 2022-07-21 MED ORDER — POTASSIUM CHLORIDE CRYS ER 20 MEQ PO TBCR: 40.0000 meq | EXTENDED_RELEASE_TABLET | Freq: Every day | ORAL | 0 refills | Status: DC

## 2022-07-21 NOTE — ED Provider Notes (Signed)
Emergency Department Provider Note   I have reviewed the triage vital signs and the nursing notes.   HISTORY  Chief Complaint Altered Mental Status  Family at bedside assisting with [place] interpretation at times.   HPI Leslie Farley is a 89 y.o. female past history of asthma, COPD, hypertension presents to the emergency department for evaluation of increased forgetfulness and mild confusion.  Symptoms have developed over the past 2 weeks.  She currently is staying at [facility].  She has been here for 1 to 2 years.  She has no prior history of dementia.  Both staff and family have noticed some increased confusion over the past 2 weeks.  She does continue to have bowel movements, which have been an issue for her in the past, but family also notes some abdominal distention which seems increased from baseline.  Patient denies any [place] pain.  There have been no new medications.  No fevers or chills.  Family sees the patient regularly.   Level 5 caveat: AMS   Past Medical History:  Diagnosis Date   Arthritis    Asthma    COPD (chronic obstructive pulmonary disease) (HCC)    GERD (gastroesophageal reflux disease)    Hypertension     Review of Systems  Constitutional: No fever/chills Cardiovascular: Denies chest pain. Gastrointestinal: Denies abdominal pain.   ____________________________________________   PHYSICAL EXAM:  VITAL SIGNS: ED Triage Vitals  Enc Vitals Group     BP 07/21/22 1600 (!) 150/64     Pulse Rate 07/21/22 1600 70     Resp 07/21/22 1600 19     Temp 07/21/22 1600 97.7 F (36.5 C)     Temp src --      SpO2 07/21/22 1557 96 %   Constitutional: Alert with mild confusion at times. Interacting and participating with exam.  Eyes: Conjunctivae are normal. PERRL.  Head: Atraumatic. Nose: No congestion/rhinnorhea. Mouth/Throat: Mucous membranes are moist.  Neck: No stridor.   Cardiovascular: Normal rate, regular rhythm. Good peripheral  circulation. Grossly normal heart sounds.   Respiratory: Normal respiratory effort.  No retractions. Lungs CTAB. Gastrointestinal: Soft and nontender. Mild/moderate distention.  Musculoskeletal: No gross deformities of extremities. Neurologic:  Normal speech and language. No gross focal neurologic deficits are appreciated.  Skin:  Skin is warm, dry and intact. No rash noted.  ____________________________________________   LABS (all labs ordered are listed, but only abnormal results are displayed)  Labs Reviewed  COMPREHENSIVE METABOLIC PANEL - Abnormal; Notable for the following components:      Result Value   Potassium 3.0 (*)    Glucose, Bld 134 (*)    All other components within normal limits  CBC WITH DIFFERENTIAL/PLATELET - Abnormal; Notable for the following components:   RBC 3.35 (*)    Hemoglobin 10.5 (*)    HCT 32.2 (*)    All other components within normal limits  URINALYSIS, W/ REFLEX TO CULTURE (INFECTION SUSPECTED) - Abnormal; Notable for the following components:   APPearance HAZY (*)    Nitrite POSITIVE (*)    Leukocytes,Ua LARGE (*)    Bacteria, UA MANY (*)    All other components within normal limits  SARS CORONAVIRUS 2 BY RT PCR  URINE CULTURE  LIPASE, BLOOD  TROPONIN I (HIGH SENSITIVITY)  TROPONIN I (HIGH SENSITIVITY)   ____________________________________________  EKG   EKG Interpretation  Date/Time:  Tuesday Jul 21 2022 16:01:59 EDT Ventricular Rate:  69 PR Interval:  41 QRS Duration: 103 QT Interval:  384 QTC Calculation: 412 R Axis:   90 Text Interpretation: Sinus rhythm Short PR interval Borderline right axis deviation Low voltage, precordial leads Confirmed by Alona Bene [phone]) on 07/21/2022 4:35:55 PM        ____________________________________________  RADIOLOGY  CT ABDOMEN PELVIS WO CONTRAST  Result Date: 07/21/2022 CLINICAL DATA:  Abdominal pain, acute, nonlocalized, abdominal distension EXAM: CT ABDOMEN AND PELVIS WITHOUT  CONTRAST TECHNIQUE: Multidetector CT imaging of the abdomen and pelvis was performed following the standard protocol without IV contrast. RADIATION DOSE REDUCTION: This exam was performed according to the departmental dose-optimization program which includes automated exposure control, adjustment of the mA and/or kV according to patient size and/or use of iterative reconstruction technique. COMPARISON:  03/29/2022 FINDINGS: Lower chest: No acute abnormality.  Small hiatal hernia Hepatobiliary: No focal liver abnormality is seen. Status post cholecystectomy. No biliary dilatation. Pancreas: Fatty atrophy.  Otherwise unremarkable. Spleen: Unremarkable Adrenals/Urinary Tract: Adrenal glands are unremarkable. Kidneys are normal, without renal calculi, focal lesion, or hydronephrosis. Bladder is unremarkable. Stomach/Bowel: Severe sigmoid diverticulosis without superimposed acute inflammatory change. The stomach, small bowel, and large bowel are otherwise unremarkable. Appendix normal. No free intraperitoneal gas or fluid. Stable mild wispy infiltration of the small bowel mesentery, possibly the sequela of remote inflammation or treated disease. Vascular/Lymphatic: Aortic atherosclerosis. No enlarged abdominal or pelvic lymph nodes. Reproductive: Uterus and bilateral adnexa are unremarkable. Other: Diastasis of the rectus abdominus musculature noted without frank hernia formation. Musculoskeletal: Degenerative changes are seen within the visualized thoracolumbar spine. No acute bone abnormality. No focal lytic or blastic bone lesion. IMPRESSION: 1. No acute intra-abdominal pathology identified. No definite radiographic explanation for the patient's reported symptoms. 2. Small hiatal hernia. 3. Severe sigmoid diverticulosis without superimposed acute inflammatory change. 4. Stable mild wispy infiltration of the small bowel mesentery, possibly the sequela of remote inflammation or treated disease. 5. Aortic  atherosclerosis. Aortic Atherosclerosis (ICD10-I70.0). Electronically Signed   By: Helyn Numbers M.D.   On: 07/21/2022 17:35   CT Head Wo Contrast  Result Date: 07/21/2022 CLINICAL DATA:  Mental status change, unknown cause, dementia EXAM: CT HEAD WITHOUT CONTRAST TECHNIQUE: Contiguous axial images were obtained from the base of the skull through the vertex without intravenous contrast. RADIATION DOSE REDUCTION: This exam was performed according to the departmental dose-optimization program which includes automated exposure control, adjustment of the mA and/or kV according to patient size and/or use of iterative reconstruction technique. COMPARISON:  11/20/2016 FINDINGS: Brain: Normal anatomic configuration. Parenchymal volume loss is commensurate with the patient's age. Stable, mild periventricular white matter changes are present likely reflecting the sequela of small vessel ischemia. No abnormal intra or extra-axial mass lesion or fluid collection. No abnormal mass effect or midline shift. No evidence of acute intracranial hemorrhage or infarct. Ventricular size is normal. Cerebellum unremarkable. Vascular: No asymmetric hyperdense vasculature at the skull base. Skull: Intact Sinuses/Orbits: Paranasal sinuses are clear. Orbits are unremarkable. Other: Mastoid air cells and middle ear cavities are clear. IMPRESSION: 1. No acute intracranial hemorrhage or infarct. Stable senescent change. Electronically Signed   By: Helyn Numbers M.D.   On: 07/21/2022 17:29    ____________________________________________   PROCEDURES  Procedure(s) performed:   Procedures  None  ____________________________________________   INITIAL IMPRESSION / ASSESSMENT AND PLAN / ED COURSE  Pertinent labs & imaging results that were available during my care of the patient were reviewed by me and considered in my medical decision making (see chart for details).   This patient is Presenting for Evaluation of  AMS, which  does require a range of treatment options, and is a complaint that involves a high risk of morbidity and mortality.  The Differential Diagnoses includes but is not exclusive to alcohol, illicit or prescription medications, intracranial pathology such as stroke, intracerebral hemorrhage, fever or infectious causes including sepsis, hypoxemia, uremia, trauma, endocrine related disorders such as diabetes, hypoglycemia, thyroid-related diseases, etc.   Critical Interventions-    Medications  cefTRIAXone (ROCEPHIN) 1 g in sodium chloride 0.9 % 100 mL IVPB (1 g Intravenous New Bag/Given 07/21/22 1854)  sodium chloride 0.9 % bolus 500 mL (0 mLs Intravenous Stopped 07/21/22 1855)    Reassessment after intervention:  symptoms improved with IVF.    I did obtain Additional Historical Information from daughter at bedside.  I decided to review pertinent External Data, and in summary patient's last admit was for constipation mgmt in February 2024.   Clinical Laboratory Tests Ordered, included UA consistent with UTI. CMP with mild hypokalemia at 3.0. No AKI.   Radiologic Tests Ordered, included CT head and abd/pelvis. I independently interpreted the images and agree with radiology interpretation.   Cardiac Monitor Tracing which shows NSR.   Social Determinants of Health Risk patient is a non-smoker.   Medical Decision Making: Summary:  Presents emergency department with increased forgetfulness and some fatigue.  No abdominal pain.  Continues to have bowel movements according to staff and family at bedside.  Some abdominal distention on exam.  Plan for CT abdomen pelvis along with CT imaging of the head given altered mental status.  No prior history of dementia.  No new medications.  Plan for IV fluids and reassess after labs.  Reevaluation with update and discussion with patient and daughter at bedside. Plan for Rocephin here and Keflex PO at home along with K supplementation. Patient already at [facility]  and seems appropriate for return there today for UTI mgmt.   Considered admission but appears stable for discharge to SNF.   Patient's presentation is most consistent with acute presentation with potential threat to life or bodily function.   Disposition: discharge  ____________________________________________  FINAL CLINICAL IMPRESSION(S) / ED DIAGNOSES  Final diagnoses:  Transient alteration of awareness  Acute cystitis without hematuria     NEW OUTPATIENT MEDICATIONS STARTED DURING THIS VISIT:  New Prescriptions   CEPHALEXIN (KEFLEX) 500 MG CAPSULE    Take 1 capsule (500 mg total) by mouth 4 (four) times daily for 7 days.   POTASSIUM CHLORIDE SA (KLOR-CON M) 20 MEQ TABLET    Take 2 tablets (40 mEq total) by mouth daily for 4 days.    Note:  This document was prepared using Dragon voice recognition software and may include unintentional dictation errors.  Alona Bene, MD, [facility] Emergency Medicine    Alexias Margerum, Arlyss Repress, MD 07/21/22 Windell Moment

## 2022-07-21 NOTE — ED Triage Notes (Signed)
Pt arrived via EMS from [facility] and rehab decreased in cognitive function per family and facility. Pt can identify self pt speak french. Pt did not recognize family members or staff. Pt was given daily meds and was unable to take them or understand instructions given to her. Per facility pt normally takes meds on her own. Facility also noticed distended abdomin for the past 2 days, increasingly worst. Pt has history dementia.

## 2022-07-21 NOTE — ED Notes (Signed)
Nurse spoke to facility. Per facility pt is a full code, has not had a bowel movement since 05/23. Pt is non-ambulatory, she normally uses a wheelchair. Pt altered mental status began 2 days ago. Urine was sent off a few days ago and came back negative.

## 2022-07-21 NOTE — Discharge Instructions (Signed)

## 2022-07-22 LAB — URINE CULTURE

## 2022-07-23 LAB — URINE CULTURE: Culture: >=100000 — AB

## 2022-07-24 ENCOUNTER — Telehealth (HOSPITAL_BASED_OUTPATIENT_CLINIC_OR_DEPARTMENT_OTHER): Payer: Self-pay | Admitting: Emergency Medicine

## 2022-07-24 NOTE — Telephone Encounter (Signed)
Post ED Visit - Positive Culture Follow-up  Culture report reviewed by antimicrobial stewardship pharmacist: [facility] []  Enzo Bi, Pharm.D. []  Celedonio Miyamoto, Pharm.D., BCPS AQ-ID []  Garvin Fila, Pharm.D., BCPS [x]  Georgina Pillion, Pharm.D., BCPS []  [place], [address].D., BCPS, AAHIVP []  Estella Husk, Pharm.D., BCPS, AAHIVP []  Lysle Pearl, PharmD, BCPS []  Phillips Climes, PharmD, BCPS []  Agapito Games, PharmD, BCPS []  Verlan Friends, PharmD []  Mervyn Gay, PharmD, BCPS []  Vinnie Level, PharmD  [facility] []  Len Childs, PharmD []  Greer Pickerel, PharmD []  Adalberto Cole, PharmD []  Perlie Gold, Rph []  Lonell Face) Jean Rosenthal, PharmD []  Earl Many, PharmD []  Junita Push, PharmD []  Dorna Leitz, PharmD []  Terrilee Files, PharmD []  Lynann Beaver, PharmD []  Keturah Barre, PharmD []  Loralee Pacas, PharmD []  Bernadene Person, PharmD   Positive urine culture Treated with Cephalexin, organism sensitive to the same and no further patient follow-up is required at this time.  Tanna Savoy Franciszek Platten 07/24/2022, 5:22 PM

## 2022-09-15 ENCOUNTER — Emergency Department (HOSPITAL_COMMUNITY): Payer: Medicare Other

## 2022-09-15 ENCOUNTER — Inpatient Hospital Stay (HOSPITAL_COMMUNITY)
Admission: EM | Admit: 2022-09-15 | Discharge: 2022-09-22 | DRG: 388 | Disposition: A | Discharge status: Skilled Nursing Facility | Payer: Medicare Other | Source: Skilled Nursing Facility | Attending: Internal Medicine | Admitting: Internal Medicine

## 2022-09-15 ENCOUNTER — Encounter (HOSPITAL_COMMUNITY): Payer: Self-pay

## 2022-09-15 ENCOUNTER — Other Ambulatory Visit: Payer: Self-pay

## 2022-09-15 DIAGNOSIS — F05 Delirium due to known physiological condition: Secondary | ICD-10-CM | POA: Diagnosis present

## 2022-09-15 DIAGNOSIS — K5900 Constipation, unspecified: Secondary | ICD-10-CM | POA: Diagnosis present

## 2022-09-15 DIAGNOSIS — J449 Chronic obstructive pulmonary disease, unspecified: Secondary | ICD-10-CM | POA: Diagnosis present

## 2022-09-15 DIAGNOSIS — Z833 Family history of diabetes mellitus: Secondary | ICD-10-CM

## 2022-09-15 DIAGNOSIS — Z79899 Other long term (current) drug therapy: Secondary | ICD-10-CM

## 2022-09-15 DIAGNOSIS — K219 Gastro-esophageal reflux disease without esophagitis: Secondary | ICD-10-CM | POA: Diagnosis present

## 2022-09-15 DIAGNOSIS — Z8719 Personal history of other diseases of the digestive system: Secondary | ICD-10-CM

## 2022-09-15 DIAGNOSIS — N179 Acute kidney failure, unspecified: Secondary | ICD-10-CM

## 2022-09-15 DIAGNOSIS — I2510 Atherosclerotic heart disease of native coronary artery without angina pectoris: Secondary | ICD-10-CM | POA: Diagnosis present

## 2022-09-15 DIAGNOSIS — E876 Hypokalemia: Secondary | ICD-10-CM | POA: Diagnosis present

## 2022-09-15 DIAGNOSIS — J4489 Other specified chronic obstructive pulmonary disease: Secondary | ICD-10-CM | POA: Diagnosis present

## 2022-09-15 DIAGNOSIS — I10 Essential (primary) hypertension: Secondary | ICD-10-CM | POA: Diagnosis present

## 2022-09-15 DIAGNOSIS — F0390 Unspecified dementia without behavioral disturbance: Secondary | ICD-10-CM

## 2022-09-15 DIAGNOSIS — R1084 Generalized abdominal pain: Secondary | ICD-10-CM

## 2022-09-15 DIAGNOSIS — Z66 Do not resuscitate: Secondary | ICD-10-CM | POA: Diagnosis not present

## 2022-09-15 DIAGNOSIS — D72829 Elevated white blood cell count, unspecified: Secondary | ICD-10-CM

## 2022-09-15 DIAGNOSIS — Z9049 Acquired absence of other specified parts of digestive tract: Secondary | ICD-10-CM

## 2022-09-15 DIAGNOSIS — Z91018 Allergy to other foods: Secondary | ICD-10-CM

## 2022-09-15 DIAGNOSIS — K567 Ileus, unspecified: Secondary | ICD-10-CM | POA: Diagnosis not present

## 2022-09-15 DIAGNOSIS — K5730 Diverticulosis of large intestine without perforation or abscess without bleeding: Secondary | ICD-10-CM | POA: Diagnosis present

## 2022-09-15 DIAGNOSIS — Z7951 Long term (current) use of inhaled steroids: Secondary | ICD-10-CM

## 2022-09-15 DIAGNOSIS — E860 Dehydration: Secondary | ICD-10-CM | POA: Diagnosis present

## 2022-09-15 DIAGNOSIS — N3289 Other specified disorders of bladder: Secondary | ICD-10-CM | POA: Diagnosis present

## 2022-09-15 DIAGNOSIS — Z7189 Other specified counseling: Secondary | ICD-10-CM

## 2022-09-15 DIAGNOSIS — R109 Unspecified abdominal pain: Secondary | ICD-10-CM

## 2022-09-15 DIAGNOSIS — J9601 Acute respiratory failure with hypoxia: Secondary | ICD-10-CM | POA: Diagnosis not present

## 2022-09-15 DIAGNOSIS — Z515 Encounter for palliative care: Secondary | ICD-10-CM

## 2022-09-15 DIAGNOSIS — D649 Anemia, unspecified: Secondary | ICD-10-CM | POA: Diagnosis present

## 2022-09-15 DIAGNOSIS — G9341 Metabolic encephalopathy: Secondary | ICD-10-CM | POA: Diagnosis not present

## 2022-09-15 DIAGNOSIS — R7401 Elevation of levels of liver transaminase levels: Secondary | ICD-10-CM

## 2022-09-15 DIAGNOSIS — K5981 Ogilvie syndrome: Secondary | ICD-10-CM | POA: Diagnosis present

## 2022-09-15 DIAGNOSIS — Z87891 Personal history of nicotine dependence: Secondary | ICD-10-CM

## 2022-09-15 DIAGNOSIS — Z808 Family history of malignant neoplasm of other organs or systems: Secondary | ICD-10-CM

## 2022-09-15 LAB — COMPREHENSIVE METABOLIC PANEL
ALT: 55 U/L — ABNORMAL HIGH (ref 0–44)
AST: 48 U/L — ABNORMAL HIGH (ref 15–41)
Albumin: 3.5 g/dL (ref 3.5–5.0)
Alkaline Phosphatase: 60 U/L (ref 38–126)
Anion gap: 8 (ref 5–15)
BUN: 40 mg/dL — ABNORMAL HIGH (ref 8–23)
CO2: 30 mmol/L (ref 22–32)
Calcium: 9.6 mg/dL (ref 8.9–10.3)
Chloride: 101 mmol/L (ref 98–111)
Creatinine, Ser: 1.12 mg/dL — ABNORMAL HIGH (ref 0.44–1.00)
GFR, Estimated: 47 mL/min — ABNORMAL LOW (ref >60)
Glucose, Bld: 118 mg/dL — ABNORMAL HIGH (ref 70–99)
Potassium: 3.7 mmol/L (ref 3.5–5.1)
Sodium: 139 mmol/L (ref 135–145)
Total Bilirubin: 0.7 mg/dL (ref 0.3–1.2)
Total Protein: 7.2 g/dL (ref 6.5–8.1)

## 2022-09-15 LAB — CBC WITH DIFFERENTIAL/PLATELET
Abs Immature Granulocytes: 0.04 10*3/uL (ref 0.00–0.07)
Basophils Absolute: 0 10*3/uL (ref 0.0–0.1)
Basophils Relative: 0 %
Eosinophils Absolute: 0.1 10*3/uL (ref 0.0–0.5)
Eosinophils Relative: 0 %
HCT: 35.9 % — ABNORMAL LOW (ref 36.0–46.0)
Hemoglobin: 11.4 g/dL — ABNORMAL LOW (ref 12.0–15.0)
Immature Granulocytes: 0 %
Lymphocytes Relative: 13 %
Lymphs Abs: 1.9 10*3/uL (ref 0.7–4.0)
MCH: 31.4 pg (ref 26.0–34.0)
MCHC: 31.8 g/dL (ref 30.0–36.0)
MCV: 98.9 fL (ref 80.0–100.0)
Monocytes Absolute: 0.9 10*3/uL (ref 0.1–1.0)
Monocytes Relative: 6 %
Neutro Abs: 12.3 10*3/uL — ABNORMAL HIGH (ref 1.7–7.7)
Neutrophils Relative %: 81 %
Platelets: 219 10*3/uL (ref 150–400)
RBC: 3.63 MIL/uL — ABNORMAL LOW (ref 3.87–5.11)
RDW: 13.9 % (ref 11.5–15.5)
WBC: 15.1 10*3/uL — ABNORMAL HIGH (ref 4.0–10.5)
nRBC: 0 % (ref 0.0–0.2)

## 2022-09-15 LAB — LIPASE, BLOOD: Lipase: 32 U/L (ref 11–51)

## 2022-09-15 MED ORDER — IPRATROPIUM-ALBUTEROL 0.5-2.5 (3) MG/3ML IN SOLN
3.0000 mL | Freq: Once | RESPIRATORY_TRACT | Status: AC
  Administered 2022-09-15: 3 mL via RESPIRATORY_TRACT
  Filled 2022-09-15: qty 3

## 2022-09-15 MED ORDER — ACETAMINOPHEN 325 MG PO TABS
650.0000 mg | ORAL_TABLET | Freq: Four times a day (QID) | ORAL | Status: DC | PRN
  Administered 2022-09-15: 650 mg via ORAL
  Filled 2022-09-15: qty 2

## 2022-09-15 MED ORDER — SENNOSIDES-DOCUSATE SODIUM 8.6-50 MG PO TABS
2.0000 | ORAL_TABLET | Freq: Every day | ORAL | Status: DC
  Administered 2022-09-15: 2 via ORAL
  Filled 2022-09-15: qty 2

## 2022-09-15 MED ORDER — ALBUTEROL SULFATE (2.5 MG/3ML) 0.083% IN NEBU: 2.5000 mg | INHALATION_SOLUTION | RESPIRATORY_TRACT | Status: DC | PRN

## 2022-09-15 MED ORDER — ACETAMINOPHEN 650 MG RE SUPP: 650.0000 mg | Freq: Four times a day (QID) | RECTAL | Status: DC | PRN

## 2022-09-15 MED ORDER — IPRATROPIUM-ALBUTEROL 0.5-2.5 (3) MG/3ML IN SOLN: 3.0000 mL | Freq: Four times a day (QID) | RESPIRATORY_TRACT | Status: DC

## 2022-09-15 MED ORDER — SODIUM CHLORIDE 0.9 % IV BOLUS
500.0000 mL | Freq: Once | INTRAVENOUS | Status: AC
  Administered 2022-09-15: 500 mL via INTRAVENOUS

## 2022-09-15 MED ORDER — POLYETHYLENE GLYCOL 3350 17 G PO PACK
17.0000 g | PACK | Freq: Every day | ORAL | Status: DC
  Administered 2022-09-16: 17 g via ORAL

## 2022-09-15 MED ORDER — IOHEXOL 300 MG/ML  SOLN
100.0000 mL | Freq: Once | INTRAMUSCULAR | Status: AC | PRN
  Administered 2022-09-15: 100 mL via INTRAVENOUS

## 2022-09-15 MED ORDER — BISACODYL 10 MG RE SUPP: 10.0000 mg | Freq: Every day | RECTAL | Status: DC | PRN

## 2022-09-15 MED ORDER — SODIUM CHLORIDE 0.9 % IV SOLN: INTRAVENOUS | Status: DC

## 2022-09-15 NOTE — ED Provider Notes (Signed)
[place] EMERGENCY DEPARTMENT AT [facility] Provider Note   CSN: [id number] Arrival date & time: 09/15/22  1325     History  Chief Complaint  Patient presents with   Abdominal Distention   Possible SBO    Leslie Farley is a 89 y.o. female.  HPI  Patient has a history of COPD asthma hypertension, reflux, stercoral colitis and prior surgical history of cholecystectomy.  Patient has advanced dementia.  Per her daughter she is nonambulatory.  She only speaks [place] but is mostly nonverbal.  Daughter states in the last few days she has not had a bowel movement.  They also noted that her abdomen was very distended.  She did get treatment for constipation and reportedly had a bowel movement prior to coming to the ED today but her abdominal distention persisted.  Daughter states the patient is not having any pain.  She has not had any vomiting.  No known fevers  Home Medications Prior to Admission medications   Medication Sig Start Date End Date Taking? Authorizing Provider  acetaminophen (TYLENOL) 500 MG tablet Take 2 tablets (1,000 mg total) by mouth every 6 (six) hours as needed for mild pain or moderate pain. 11/20/16   Elson Areas, PA-C  acetaminophen (TYLENOL) 650 MG CR tablet Take 650 mg by mouth in the morning and at bedtime.    [provider]  ADVAIR DISKUS 100-50 MCG/ACT AEPB Inhale 1 puff into the lungs every 12 (twelve) hours. 03/26/22   [provider]  albuterol (VENTOLIN HFA) 108 (90 Base) MCG/ACT inhaler Inhale 2 puffs into the lungs every 6 (six) hours as needed for wheezing or shortness of breath. 04/01/22   Pokhrel, Rebekah Chesterfield, MD  bisacodyl (DULCOLAX) 10 MG suppository Place 10 mg rectally daily as needed for moderate constipation.    [provider]  cholecalciferol (VITAMIN D3) 25 MCG (1000 UNIT) tablet Take 2,000 Units by mouth daily.    [provider]  cycloSPORINE (RESTASIS) 0.05 % ophthalmic emulsion Place 1 drop into  both eyes 2 (two) times daily.    [provider]  famotidine (PEPCID) 20 MG tablet Take 1 tablet (20 mg total) by mouth daily. 04/02/22   Pokhrel, Rebekah Chesterfield, MD  hydrochlorothiazide (MICROZIDE) 12.5 MG capsule Take 12.5 mg by mouth daily. 03/10/22   [provider]  losartan (COZAAR) 100 MG tablet Take 100 mg by mouth daily. 03/10/22   [provider]  magnesium hydroxide (MILK OF MAGNESIA) 400 MG/5ML suspension Take 30 mLs by mouth daily as needed for mild constipation.    [provider]  melatonin 3 MG TABS tablet Take 6 mg by mouth at bedtime.    [provider]  Multiple Vitamins-Minerals (CERTAVITE/ANTIOXIDANTS PO) Take 1 tablet by mouth daily.    [provider]  nitroGLYCERIN (NITROSTAT) 0.4 MG SL tablet Place 0.4 mg under the tongue every 5 (five) minutes as needed for chest pain (Do not exceed 3 doses per episode.).    [provider]  polyethylene glycol (MIRALAX / GLYCOLAX) packet Take 17 g by mouth daily.    [provider]  potassium chloride SA (KLOR-CON M) 20 MEQ tablet Take 2 tablets (40 mEq total) by mouth daily for 4 days. 07/21/22 07/25/22  Long, Arlyss Repress, MD  Propylene Glycol (SYSTANE BALANCE) 0.6 % SOLN Place 1 drop into both eyes in the morning and at bedtime.    [provider]  Psyllium (FP FIBER LAXATIVE PO) Take 500 mg by mouth 2 (two)  times daily.    [provider]  QUEtiapine (SEROQUEL) 25 MG tablet Take 25 mg by mouth at bedtime.    [provider]  senna-docusate (SENOKOT-S) 8.6-50 MG tablet Take 2 tablets by mouth at bedtime.    [provider]  simethicone (MYLICON) 125 MG chewable tablet Chew 125 mg by mouth in the morning, at noon, and at bedtime.    [provider]  Sodium Phosphates (RA SALINE ENEMA RE) Place 1 Bottle rectally daily as needed (constipation).    [provider]  tiZANidine (ZANAFLEX) 2 MG tablet Take 1 tablet (2 mg total) by mouth  at bedtime. After completing cipro 04/06/22   Pokhrel, Rebekah Chesterfield, MD      Allergies    Cucumber extract    Review of Systems   Review of Systems  Physical Exam Updated Vital Signs BP 130/67 (BP Location: Left Arm)   Pulse 69   Temp 98 F (36.7 C) (Oral)   Resp 16   SpO2 99%  Physical Exam Vitals and nursing note reviewed.  Constitutional:      Appearance: She is well-developed. She is not diaphoretic.  HENT:     Head: Normocephalic and atraumatic.     Right Ear: External ear normal.     Left Ear: External ear normal.  Eyes:     General: No scleral icterus.       Right eye: No discharge.        Left eye: No discharge.     Conjunctiva/sclera: Conjunctivae normal.  Neck:     Trachea: No tracheal deviation.  Cardiovascular:     Rate and Rhythm: Normal rate and regular rhythm.  Pulmonary:     Effort: Pulmonary effort is normal. No respiratory distress.     Breath sounds: Normal breath sounds. No stridor. No wheezing or rales.  Abdominal:     General: Bowel sounds are normal. There is distension.     Palpations: Abdomen is soft.     Tenderness: There is no abdominal tenderness. There is no guarding or rebound.     Comments: Abdomen nontender but distention noted, soft, not tympanitic  Musculoskeletal:        General: No tenderness or deformity.     Cervical back: Neck supple.  Skin:    General: Skin is warm and dry.     Findings: No rash.  Neurological:     Mental Status: She is alert. Mental status is at baseline.     Cranial Nerves: No dysarthria or facial asymmetry.     Sensory: No sensory deficit.     Motor: Weakness present. No abnormal muscle tone or seizure activity.     Comments: No facial droop  Psychiatric:        Mood and Affect: Mood normal.     ED Results / Procedures / Treatments   Labs (all labs ordered are listed, but only abnormal results are displayed) Labs Reviewed  COMPREHENSIVE METABOLIC PANEL  LIPASE, BLOOD  CBC WITH DIFFERENTIAL/PLATELET   I-STAT CHEM 8, ED    EKG None  Radiology No results found.  Procedures Procedures    Medications Ordered in ED Medications  sodium chloride 0.9 % bolus 500 mL (has no administration in time range)    And  0.9 %  sodium chloride infusion (has no administration in time range)    ED Course/ Medical Decision Making/ A&P  Medical Decision Making Amount and/or Complexity of Data Reviewed Labs: ordered.  Risk Prescription drug management.   Patient presents ED for evaluation of abdominal distention.  Patient does have history of issues with constipation and stercoral colitis.  Patient's abdomen is distended.  I am  concerned about possible bowel obstruction.  Will plan on laboratory tests CT scan for further evaluation.  Care turned over to Dr Silverio Lay at shift change.        Final Clinical Impression(s) / ED Diagnoses Final diagnoses:  None    Rx / DC Orders ED Discharge Orders     None         Linwood Dibbles, MD 09/15/22 1536

## 2022-09-15 NOTE — ED Notes (Signed)
Pt to CT

## 2022-09-15 NOTE — ED Notes (Signed)
ED TO INPATIENT HANDOFF REPORT  ED Nurse Name and Phone #:  Rayvon Char Name/Age/Gender Leslie Farley 89 y.o. female Room/Bed: WA03/WA03  Code Status   Code Status: Full Code  Home/SNF/Other Nursing Home Patient oriented to: self Is this baseline? Yes   Triage Complete: Triage complete  Chief Complaint Ileus [facility]) [K56.7]  Triage Note EMS reports from: [facility]. Called out for abd distention w/ possible SBO. Pt Hx of dementia. Pt speaks [place]. Needs interpreter.   BP 128/70 HR 68 RR 16 Sp02 90 CBG 137 T 98.3    Allergies Allergies  Allergen Reactions   Cucumber Extract     Level of Care/Admitting Diagnosis ED Disposition     ED Disposition  Admit   Condition  --   Comment  Hospital Area: [facility] [[id number]]  Level of Care: Med-Surg [16]  May place patient in observation at [facility] or [facility] if equivalent level of care is available:: Yes  Covid Evaluation: Asymptomatic - no recent exposure (last 10 days) testing not required  Diagnosis: Ileus [facility]) [[id number]]  Admitting Physician: John Giovanni [[id number]]  Attending Physician: John Giovanni [[id number]]          B Medical/Surgery History Past Medical History:  Diagnosis Date   Arthritis    Asthma    COPD (chronic obstructive pulmonary disease) (HCC)    GERD (gastroesophageal reflux disease)    Hypertension    Past Surgical History:  Procedure Laterality Date   CHOLECYSTECTOMY       A IV Location/Drains/Wounds Patient Lines/Drains/Airways Status     Active Line/Drains/Airways     Name Placement date Placement time Site Days   Peripheral IV 09/15/22 20 G 1.88" Anterior;Left;Upper Arm 09/15/22  1836  Arm  less than 1            Intake/Output Last 24 hours No intake or output data in the 24 hours ending 09/15/22 2159  Labs/Imaging Results for orders placed or performed during the hospital encounter of 09/15/22 (from the past 48 hour(s))   Comprehensive metabolic panel     Status: Abnormal   Collection Time: 09/15/22  4:00 PM  Result Value Ref Range   Sodium 139 135 - 145 mmol/L   Potassium 3.7 3.5 - 5.1 mmol/L   Chloride 101 98 - 111 mmol/L   CO2 30 22 - 32 mmol/L   Glucose, Bld 118 (H) 70 - 99 mg/dL    Comment: Glucose reference range applies only to samples taken after fasting for at least 8 hours.   BUN 40 (H) 8 - 23 mg/dL   Creatinine, Ser 9.52 (H) 0.44 - 1.00 mg/dL   Calcium 9.6 8.9 - 84.1 mg/dL   Total Protein 7.2 6.5 - 8.1 g/dL   Albumin 3.5 3.5 - 5.0 g/dL   AST 48 (H) 15 - 41 U/L   ALT 55 (H) 0 - 44 U/L   Alkaline Phosphatase 60 38 - 126 U/L   Total Bilirubin 0.7 0.3 - 1.2 mg/dL   GFR, Estimated 47 (L) >60 mL/min    Comment: (NOTE) Calculated using the CKD-EPI Creatinine Equation (2021)    Anion gap 8 5 - 15    Comment: Performed at [facility], [address], [place], [place] [address]  Lipase, blood     Status: None   Collection Time: 09/15/22  4:00 PM  Result Value Ref Range   Lipase 32 11 - 51 U/L    Comment: Performed at [facility], [address] [place], [place] [address]  CBC with Diff     Status: Abnormal   Collection Time: 09/15/22  4:00 PM  Result Value Ref Range   WBC 15.1 (H) 4.0 - 10.5 K/uL   RBC 3.63 (L) 3.87 - 5.11 MIL/uL   Hemoglobin 11.4 (L) 12.0 - 15.0 g/dL   HCT 21.3 (L) 08.6 - 57.8 %   MCV 98.9 80.0 - 100.0 fL   MCH 31.4 26.0 - 34.0 pg   MCHC 31.8 30.0 - 36.0 g/dL   RDW 46.9 62.9 - 52.8 %   Platelets 219 150 - 400 K/uL   nRBC 0.0 0.0 - 0.2 %   Neutrophils Relative % 81 %   Neutro Abs 12.3 (H) 1.7 - 7.7 K/uL   Lymphocytes Relative 13 %   Lymphs Abs 1.9 0.7 - 4.0 K/uL   Monocytes Relative 6 %   Monocytes Absolute 0.9 0.1 - 1.0 K/uL   Eosinophils Relative 0 %   Eosinophils Absolute 0.1 0.0 - 0.5 K/uL   Basophils Relative 0 %   Basophils Absolute 0.0 0.0 - 0.1 K/uL   Immature Granulocytes 0 %   Abs Immature Granulocytes 0.04 0.00  - 0.07 K/uL    Comment: Performed at [facility] [place], [address], [place], [place] [address]   CT ABDOMEN PELVIS W CONTRAST  Result Date: 09/15/2022 CLINICAL DATA:  Abdominal pain and swelling encounter EXAM: CT ABDOMEN AND PELVIS WITH CONTRAST TECHNIQUE: Multidetector CT imaging of the abdomen and pelvis was performed using the standard protocol following bolus administration of intravenous contrast. RADIATION DOSE REDUCTION: This exam was performed according to the departmental dose-optimization program which includes automated exposure control, adjustment of the mA and/or kV according to patient size and/or use of iterative reconstruction technique. CONTRAST:  OMNIPAQUE IOHEXOL 300 MG/ML  SOLN COMPARISON:  07/21/2022 FINDINGS: Lower chest: Patchy airspace opacities are noted in the lower lobes bilaterally likely postinflammatory in nature. These are new from prior exam. Hepatobiliary: No focal liver abnormality is seen. Status post cholecystectomy. No biliary dilatation. Pancreas: Unremarkable. No pancreatic ductal dilatation or surrounding inflammatory changes. Spleen: Normal in size without focal abnormality. Adrenals/Urinary Tract: Adrenal glands are within normal limits. Kidneys demonstrate a normal enhancement pattern bilaterally. No renal calculi or obstructive changes are seen. Normal excretion is noted on delayed images. The bladder is well distended. Stomach/Bowel: Scattered diverticular change of the colon is noted. No diverticulitis is seen. Distension of the colon is seen with both air and fluid particularly in the sigmoid although no findings to suggest volvulus are noted. The appendix is within normal limits. No obstructive or inflammatory changes of the small bowel are seen. Stomach is within normal limits. Vascular/Lymphatic: Aortic atherosclerosis. No enlarged abdominal or pelvic lymph nodes. Reproductive: Uterus and bilateral adnexa are unremarkable. Other: No  abdominal wall hernia or abnormality. No abdominopelvic ascites. Musculoskeletal: Degenerative changes of lumbar spine are noted. IMPRESSION: Patchy airspace opacities in the lower lobes bilaterally new from the prior exam and felt to be postinflammatory in nature. Diverticulosis without diverticulitis. Mild distension of the colon with air and fluid although no obstructive changes are seen. No small bowel obstruction is noted. Electronically Signed   By: Alcide Clever M.D.   On: 09/15/2022 20:04   DG Abd Portable 1 View  Result Date: 09/15/2022 CLINICAL DATA:  Constipation distension EXAM: PORTABLE ABDOMEN - 1 VIEW COMPARISON:  CT 07/21/2022 FINDINGS: Moderate air distension the colon with some air-filled small bowel. Gas down  to the rectum. Mild stool in the upper quadrants. Numerous lucent centered benign appearing soft tissue calcifications IMPRESSION: Moderate air distension of colon with some air-filled small bowel, question ileus Electronically Signed   By: Jasmine Pang M.D.   On: 09/15/2022 17:14    Pending Labs Unresulted Labs (From admission, onward)     Start     Ordered   09/16/22 0500  CBC  Tomorrow morning,   R        09/15/22 2145   09/16/22 0500  Comprehensive metabolic panel  Tomorrow morning,   R        09/15/22 2145   09/15/22 1543  Urinalysis, Routine w reflex microscopic -Urine, Clean Catch  Once,   URGENT       Question:  Specimen Source  Answer:  Urine, Clean Catch   09/15/22 1542            Vitals/Pain Today's Vitals   09/15/22 1930 09/15/22 2000 09/15/22 2030 09/15/22 2155  BP: (!) 179/64 (!) 168/61 (!) 170/88 (!) 165/63  Pulse:    72  Resp:    18  Temp:      TempSrc:      SpO2:    98%  PainSc:        Isolation Precautions No active isolations  Medications Medications  sodium chloride 0.9 % bolus 500 mL (0 mLs Intravenous Stopped 09/15/22 1915)    And  0.9 %  sodium chloride infusion (has no administration in time range)  ipratropium-albuterol  (DUONEB) 0.5-2.5 (3) MG/3ML nebulizer solution 3 mL (has no administration in time range)  acetaminophen (TYLENOL) tablet 650 mg (has no administration in time range)    Or  acetaminophen (TYLENOL) suppository 650 mg (has no administration in time range)  bisacodyl (DULCOLAX) suppository 10 mg (has no administration in time range)  polyethylene glycol (MIRALAX / GLYCOLAX) packet 17 g (has no administration in time range)  senna-docusate (Senokot-S) tablet 2 tablet (has no administration in time range)  iohexol (OMNIPAQUE) 300 MG/ML solution 100 mL (100 mLs Intravenous Contrast Given 09/15/22 1922)    Mobility non-ambulatory     Focused Assessments     R Recommendations: See Admitting Provider Note  Report given to:   Additional Notes:

## 2022-09-15 NOTE — ED Triage Notes (Signed)
EMS reports from: [facility]. Called out for abd distention w/ possible SBO. Pt Hx of dementia. Pt speaks [place]. Needs interpreter.   BP 128/70 HR 68 RR 16 Sp02 90 CBG 137 T 98.3

## 2022-09-15 NOTE — ED Provider Notes (Signed)
  Physical Exam  BP (!) 170/88   Pulse 64   Temp 98 F (36.7 C)   Resp 16   SpO2 94%   Physical Exam  Procedures  Procedures  ED Course / MDM    Medical Decision Making Care assumed at 3 PM.  Patient has history of SBO.  Patient had no bowel movements for several days.  Patient is sent here for abdominal distention.  Signout pending CT abdomen pelvis  8:54 PM Patient is a difficult IV stick so CT was delayed.  Patient does not have any more bowel obstruction on CT.  Patient does have distended colon.  I am concerned for possible ileus.  I discussed with daughter who is at bedside.  She states that patient required bowel regimen in the hospital previously for this.  At this point will admit patient for ileus.  Problems Addressed: Ileus (HCC): acute illness or injury  Amount and/or Complexity of Data Reviewed Labs: ordered. Decision-making details documented in ED Course. Radiology: ordered and independent interpretation performed. Decision-making details documented in ED Course.  Risk Prescription drug management.          Charlynne Pander, MD 09/15/22 [phone]

## 2022-09-15 NOTE — H&P (Signed)
History and Physical    Leslie Farley WUJ:[id number] DOB: [birth year] DOA: 09/15/2022  PCP: Pcp, No  Patient coming from: SNF  Chief Complaint: Abdominal distention  HPI: Leslie Farley is a 89 y.o. French-speaking female with medical history significant of advanced dementia, CAD, asthma, COPD, GERD, hypertension, chronic anemia, history of cholecystectomy.  She was admitted earlier this year in February for abdominal pain and distention secondary to stercoral colitis, severe constipation, and fecal impaction.  She presents to the ED today from her nursing facility for evaluation of abdominal distention and constipation.  Vital signs stable on arrival.  Labs showing WBC 15.1, hemoglobin 11.4 (stable), BUN 40, creatinine 1.1 (baseline 0.6-0.9), AST 48, ALT 55, alk phos and T. bili normal, lipase normal, UA pending.  CT abdomen pelvis with contrast showing: "IMPRESSION: Patchy airspace opacities in the lower lobes bilaterally new from the prior exam and felt to be postinflammatory in nature.   Diverticulosis without diverticulitis.   Mild distension of the colon with air and fluid although no obstructive changes are seen. No small bowel obstruction is noted."  Patient received 500 cc normal saline bolus and was started on normal saline at 125 mL/hr in the ED.  Patient has advanced dementia.  History provided by daughter at [place] bedside.  Daughter states patient stays at a nursing facility due to progressively worsening dementia over the past 6 months.  Daughter is concerned that patient is having worsening abdominal distention and pain.  She is concerned that the patient is constipated despite receiving multiple laxatives at her nursing facility.  No nausea or vomiting.  Patient is reporting generalized abdominal pain.  Denies shortness of breath or chest pain.  Review of Systems:  Review of Systems  All other systems reviewed and are negative.   Past Medical History:  Diagnosis  Date   Arthritis    Asthma    COPD (chronic obstructive pulmonary disease) (HCC)    GERD (gastroesophageal reflux disease)    Hypertension     Past Surgical History:  Procedure Laterality Date   CHOLECYSTECTOMY       reports that she quit smoking about 51 years ago. She has never used smokeless tobacco. She reports that she does not drink alcohol and does not use drugs.  Allergies  Allergen Reactions   Cucumber Extract     Family History  Problem Relation Age of Onset   Cancer - Other Mother        Died of throat cancer   Cancer - Prostate Father    Diabetes Brother     Prior to Admission medications   Medication Sig Start Date End Date Taking? Authorizing Provider  acetaminophen (TYLENOL) 500 MG tablet Take 2 tablets (1,000 mg total) by mouth every 6 (six) hours as needed for mild pain or moderate pain. 11/20/16   Elson Areas, PA-C  acetaminophen (TYLENOL) 650 MG CR tablet Take 650 mg by mouth in the morning and at bedtime.    [provider]  ADVAIR DISKUS 100-50 MCG/ACT AEPB Inhale 1 puff into the lungs every 12 (twelve) hours. 03/26/22   [provider]  albuterol (VENTOLIN HFA) 108 (90 Base) MCG/ACT inhaler Inhale 2 puffs into the lungs every 6 (six) hours as needed for wheezing or shortness of breath. 04/01/22   Pokhrel, Rebekah Chesterfield, MD  bisacodyl (DULCOLAX) 10 MG suppository Place 10 mg rectally daily as needed for moderate constipation.    [provider]  cholecalciferol (VITAMIN D3) 25 MCG (1000 UNIT) tablet Take 2,000  Units by mouth daily.    [provider]  cycloSPORINE (RESTASIS) 0.05 % ophthalmic emulsion Place 1 drop into both eyes 2 (two) times daily.    [provider]  famotidine (PEPCID) 20 MG tablet Take 1 tablet (20 mg total) by mouth daily. 04/02/22   Pokhrel, Rebekah Chesterfield, MD  hydrochlorothiazide (MICROZIDE) 12.5 MG capsule Take 12.5 mg by mouth daily. 03/10/22   [provider]  losartan (COZAAR) 100 MG tablet Take  100 mg by mouth daily. 03/10/22   [provider]  magnesium hydroxide (MILK OF MAGNESIA) 400 MG/5ML suspension Take 30 mLs by mouth daily as needed for mild constipation.    [provider]  melatonin 3 MG TABS tablet Take 6 mg by mouth at bedtime.    [provider]  Multiple Vitamins-Minerals (CERTAVITE/ANTIOXIDANTS PO) Take 1 tablet by mouth daily.    [provider]  nitroGLYCERIN (NITROSTAT) 0.4 MG SL tablet Place 0.4 mg under the tongue every 5 (five) minutes as needed for chest pain (Do not exceed 3 doses per episode.).    [provider]  polyethylene glycol (MIRALAX / GLYCOLAX) packet Take 17 g by mouth daily.    [provider]  potassium chloride SA (KLOR-CON M) 20 MEQ tablet Take 2 tablets (40 mEq total) by mouth daily for 4 days. 07/21/22 07/25/22  Long, Arlyss Repress, MD  Propylene Glycol (SYSTANE BALANCE) 0.6 % SOLN Place 1 drop into both eyes in the morning and at bedtime.    [provider]  Psyllium (FP FIBER LAXATIVE PO) Take 500 mg by mouth 2 (two) times daily.    [provider]  QUEtiapine (SEROQUEL) 25 MG tablet Take 25 mg by mouth at bedtime.    [provider]  senna-docusate (SENOKOT-S) 8.6-50 MG tablet Take 2 tablets by mouth at bedtime.    [provider]  simethicone (MYLICON) 125 MG chewable tablet Chew 125 mg by mouth in the morning, at noon, and at bedtime.    [provider]  Sodium Phosphates (RA SALINE ENEMA RE) Place 1 Bottle rectally daily as needed (constipation).    [provider]  tiZANidine (ZANAFLEX) 2 MG tablet Take 1 tablet (2 mg total) by mouth at bedtime. After completing cipro 04/06/22   Joycelyn Das, MD    Physical Exam: Vitals:   09/15/22 1900 09/15/22 1930 09/15/22 2000 09/15/22 2030  BP: (!) 160/146 (!) 179/64 (!) 168/61 (!) 170/88  Pulse: 64     Resp: 16     Temp: 98 F (36.7 C)     TempSrc:      SpO2: 94%       Physical Exam Vitals  reviewed.  Constitutional:      General: She is not in acute distress. HENT:     Head: Normocephalic and atraumatic.  Eyes:     Extraocular Movements: Extraocular movements intact.  Cardiovascular:     Rate and Rhythm: Normal rate and regular rhythm.     Pulses: Normal pulses.  Pulmonary:     Effort: Pulmonary effort is normal. No respiratory distress.     Breath sounds: Wheezing present. No rales.  Abdominal:     General: Bowel sounds are normal. There is distension.     Palpations: Abdomen is soft.     Tenderness: There is abdominal tenderness.     Comments: Generalized tenderness to palpation  Musculoskeletal:     Cervical back: Normal range of motion.     Right lower leg: No edema.  Left lower leg: No edema.  Skin:    General: Skin is warm and dry.  Neurological:     General: No focal deficit present.     Mental Status: She is alert and oriented to person, place, and time.     Labs on Admission: I have personally reviewed following labs and imaging studies  CBC: Recent Labs  Lab 09/15/22 1600  WBC 15.1*  NEUTROABS 12.3*  HGB 11.4*  HCT 35.9*  MCV 98.9  PLT 219   Basic Metabolic Panel: Recent Labs  Lab 09/15/22 1600  NA 139  K 3.7  CL 101  CO2 30  GLUCOSE 118*  BUN 40*  CREATININE 1.12*  CALCIUM 9.6   GFR: CrCl cannot be calculated (Unknown ideal weight.). Liver Function Tests: Recent Labs  Lab 09/15/22 1600  AST 48*  ALT 55*  ALKPHOS 60  BILITOT 0.7  PROT 7.2  ALBUMIN 3.5   Recent Labs  Lab 09/15/22 1600  LIPASE 32   No results for input(s): "AMMONIA" in the last 168 hours. Coagulation Profile: No results for input(s): "INR", "PROTIME" in the last 168 hours. Cardiac Enzymes: No results for input(s): "CKTOTAL", "CKMB", "CKMBINDEX", "TROPONINI" in the last 168 hours. BNP (last 3 results) No results for input(s): "PROBNP" in the last 8760 hours. HbA1C: No results for input(s): "HGBA1C" in the last 72 hours. CBG: No results for  input(s): "GLUCAP" in the last 168 hours. Lipid Profile: No results for input(s): "CHOL", "HDL", "LDLCALC", "TRIG", "CHOLHDL", "LDLDIRECT" in the last 72 hours. Thyroid Function Tests: No results for input(s): "TSH", "T4TOTAL", "FREET4", "T3FREE", "THYROIDAB" in the last 72 hours. Anemia Panel: No results for input(s): "VITAMINB12", "FOLATE", "FERRITIN", "TIBC", "IRON", "RETICCTPCT" in the last 72 hours. Urine analysis:    Component Value Date/Time   COLORURINE YELLOW 07/21/2022 1650   APPEARANCEUR HAZY (A) 07/21/2022 1650   LABSPEC 1.020 07/21/2022 1650   PHURINE 5.0 07/21/2022 1650   GLUCOSEU NEGATIVE 07/21/2022 1650   HGBUR NEGATIVE 07/21/2022 1650   BILIRUBINUR NEGATIVE 07/21/2022 1650   KETONESUR NEGATIVE 07/21/2022 1650   PROTEINUR NEGATIVE 07/21/2022 1650   UROBILINOGEN 0.2 11/02/2014 0832   NITRITE POSITIVE (A) 07/21/2022 1650   LEUKOCYTESUR LARGE (A) 07/21/2022 1650    Radiological Exams on Admission: CT ABDOMEN PELVIS W CONTRAST  Result Date: 09/15/2022 CLINICAL DATA:  Abdominal pain and swelling encounter EXAM: CT ABDOMEN AND PELVIS WITH CONTRAST TECHNIQUE: Multidetector CT imaging of the abdomen and pelvis was performed using the standard protocol following bolus administration of intravenous contrast. RADIATION DOSE REDUCTION: This exam was performed according to the departmental dose-optimization program which includes automated exposure control, adjustment of the mA and/or kV according to patient size and/or use of iterative reconstruction technique. CONTRAST:  OMNIPAQUE IOHEXOL 300 MG/ML  SOLN COMPARISON:  07/21/2022 FINDINGS: Lower chest: Patchy airspace opacities are noted in the lower lobes bilaterally likely postinflammatory in nature. These are new from prior exam. Hepatobiliary: No focal liver abnormality is seen. Status post cholecystectomy. No biliary dilatation. Pancreas: Unremarkable. No pancreatic ductal dilatation or surrounding inflammatory changes.  Spleen: Normal in size without focal abnormality. Adrenals/Urinary Tract: Adrenal glands are within normal limits. Kidneys demonstrate a normal enhancement pattern bilaterally. No renal calculi or obstructive changes are seen. Normal excretion is noted on delayed images. The bladder is well distended. Stomach/Bowel: Scattered diverticular change of the colon is noted. No diverticulitis is seen. Distension of the colon is seen with both air and fluid particularly in the sigmoid although no findings to suggest volvulus  are noted. The appendix is within normal limits. No obstructive or inflammatory changes of the small bowel are seen. Stomach is within normal limits. Vascular/Lymphatic: Aortic atherosclerosis. No enlarged abdominal or pelvic lymph nodes. Reproductive: Uterus and bilateral adnexa are unremarkable. Other: No abdominal wall hernia or abnormality. No abdominopelvic ascites. Musculoskeletal: Degenerative changes of lumbar spine are noted. IMPRESSION: Patchy airspace opacities in the lower lobes bilaterally new from the prior exam and felt to be postinflammatory in nature. Diverticulosis without diverticulitis. Mild distension of the colon with air and fluid although no obstructive changes are seen. No small bowel obstruction is noted. Electronically Signed   By: Alcide Clever M.D.   On: 09/15/2022 20:04   DG Abd Portable 1 View  Result Date: 09/15/2022 CLINICAL DATA:  Constipation distension EXAM: PORTABLE ABDOMEN - 1 VIEW COMPARISON:  CT 07/21/2022 FINDINGS: Moderate air distension the colon with some air-filled small bowel. Gas down to the rectum. Mild stool in the upper quadrants. Numerous lucent centered benign appearing soft tissue calcifications IMPRESSION: Moderate air distension of colon with some air-filled small bowel, question ileus Electronically Signed   By: Jasmine Pang M.D.   On: 09/15/2022 17:14    Assessment and Plan     advanced dementia, CAD, asthma, COPD, GERD, hypertension,  chronic anemia, history of cholecystectomy.  She was admitted earlier this year in February for abdominal pain and distention secondary to stercoral colitis, severe constipation, and fecal impaction.  She presents to the ED today from her nursing facility for evaluation of abdominal distention and constipation.  Vital signs stable on arrival.  Labs showing WBC 15.1, hemoglobin 11.4 (stable), BUN 40, creatinine 1.1 (baseline 0.6-0.9), AST 48, ALT 55, alk phos and T. bili normal, lipase normal, UA pending.  CT abdomen pelvis with contrast showing: "IMPRESSION: Patchy airspace opacities in the lower lobes bilaterally new from the prior exam and felt to be postinflammatory in nature.   Diverticulosis without diverticulitis.   Mild distension of the colon with air and fluid although no obstructive changes are seen. No small bowel obstruction is noted."  Patient received 500 cc normal saline bolus and was started on normal saline at 125 mL/hr in the ED.   Status postcholecystectomy and CT showing no focal liver abnormality or biliary dilatation.  DVT prophylaxis: {Blank single:19197::"Lovenox","SQ Heparin","IV heparin gtt","Xarelto","Eliquis","Coumadin","SCDs","***"} Code Status: {Blank single:19197::"Full Code","DNR","DNR/DNI","Comfort Care","***"} Family Communication: ***  Consults called: ***  Level of care: {Blank single:19197::"Med-Surg","Telemetry bed","Progressive Care Unit","Step Down Unit"} Admission status: *** Time Spent: 75+ minutes***  John Giovanni MD [facility]  If 7PM-7AM, please contact night-coverage [email or web address]  09/15/2022, 8:53 PM

## 2022-09-16 ENCOUNTER — Observation Stay (HOSPITAL_COMMUNITY): Payer: Medicare Other

## 2022-09-16 DIAGNOSIS — N179 Acute kidney failure, unspecified: Secondary | ICD-10-CM | POA: Diagnosis present

## 2022-09-16 DIAGNOSIS — K5901 Slow transit constipation: Secondary | ICD-10-CM | POA: Diagnosis not present

## 2022-09-16 DIAGNOSIS — R7401 Elevation of levels of liver transaminase levels: Secondary | ICD-10-CM | POA: Diagnosis present

## 2022-09-16 DIAGNOSIS — Z66 Do not resuscitate: Secondary | ICD-10-CM | POA: Diagnosis not present

## 2022-09-16 DIAGNOSIS — Z87891 Personal history of nicotine dependence: Secondary | ICD-10-CM | POA: Diagnosis not present

## 2022-09-16 DIAGNOSIS — K5730 Diverticulosis of large intestine without perforation or abscess without bleeding: Secondary | ICD-10-CM | POA: Diagnosis present

## 2022-09-16 DIAGNOSIS — J4489 Other specified chronic obstructive pulmonary disease: Secondary | ICD-10-CM | POA: Diagnosis present

## 2022-09-16 DIAGNOSIS — F05 Delirium due to known physiological condition: Secondary | ICD-10-CM | POA: Diagnosis present

## 2022-09-16 DIAGNOSIS — R109 Unspecified abdominal pain: Secondary | ICD-10-CM

## 2022-09-16 DIAGNOSIS — E860 Dehydration: Secondary | ICD-10-CM | POA: Diagnosis present

## 2022-09-16 DIAGNOSIS — J9601 Acute respiratory failure with hypoxia: Secondary | ICD-10-CM | POA: Diagnosis not present

## 2022-09-16 DIAGNOSIS — G9341 Metabolic encephalopathy: Secondary | ICD-10-CM | POA: Diagnosis not present

## 2022-09-16 DIAGNOSIS — D72829 Elevated white blood cell count, unspecified: Secondary | ICD-10-CM

## 2022-09-16 DIAGNOSIS — Z7951 Long term (current) use of inhaled steroids: Secondary | ICD-10-CM | POA: Diagnosis not present

## 2022-09-16 DIAGNOSIS — F0390 Unspecified dementia without behavioral disturbance: Secondary | ICD-10-CM | POA: Diagnosis present

## 2022-09-16 DIAGNOSIS — Z79899 Other long term (current) drug therapy: Secondary | ICD-10-CM | POA: Diagnosis not present

## 2022-09-16 DIAGNOSIS — K567 Ileus, unspecified: Principal | ICD-10-CM | POA: Diagnosis present

## 2022-09-16 DIAGNOSIS — Z9049 Acquired absence of other specified parts of digestive tract: Secondary | ICD-10-CM | POA: Diagnosis not present

## 2022-09-16 DIAGNOSIS — Z7189 Other specified counseling: Secondary | ICD-10-CM | POA: Diagnosis not present

## 2022-09-16 DIAGNOSIS — Z515 Encounter for palliative care: Secondary | ICD-10-CM | POA: Diagnosis not present

## 2022-09-16 DIAGNOSIS — K219 Gastro-esophageal reflux disease without esophagitis: Secondary | ICD-10-CM | POA: Diagnosis present

## 2022-09-16 DIAGNOSIS — R531 Weakness: Secondary | ICD-10-CM | POA: Diagnosis not present

## 2022-09-16 DIAGNOSIS — I10 Essential (primary) hypertension: Secondary | ICD-10-CM | POA: Diagnosis present

## 2022-09-16 DIAGNOSIS — E876 Hypokalemia: Secondary | ICD-10-CM | POA: Diagnosis present

## 2022-09-16 DIAGNOSIS — I2510 Atherosclerotic heart disease of native coronary artery without angina pectoris: Secondary | ICD-10-CM | POA: Diagnosis present

## 2022-09-16 DIAGNOSIS — Z91018 Allergy to other foods: Secondary | ICD-10-CM | POA: Diagnosis not present

## 2022-09-16 DIAGNOSIS — K5981 Ogilvie syndrome: Secondary | ICD-10-CM | POA: Diagnosis present

## 2022-09-16 DIAGNOSIS — D649 Anemia, unspecified: Secondary | ICD-10-CM | POA: Diagnosis present

## 2022-09-16 LAB — CBC
HCT: 32.6 % — ABNORMAL LOW (ref 36.0–46.0)
Hemoglobin: 10.5 g/dL — ABNORMAL LOW (ref 12.0–15.0)
MCH: 31.8 pg (ref 26.0–34.0)
MCHC: 32.2 g/dL (ref 30.0–36.0)
MCV: 98.8 fL (ref 80.0–100.0)
Platelets: 214 10*3/uL (ref 150–400)
RBC: 3.3 MIL/uL — ABNORMAL LOW (ref 3.87–5.11)
RDW: 13.7 % (ref 11.5–15.5)
WBC: 10.1 10*3/uL (ref 4.0–10.5)
nRBC: 0 % (ref 0.0–0.2)

## 2022-09-16 LAB — VITAMIN B12: Vitamin B-12: 771 pg/mL (ref 180–914)

## 2022-09-16 LAB — BLOOD GAS, ARTERIAL
Acid-Base Excess: 4.4 mmol/L — ABNORMAL HIGH (ref 0.0–2.0)
Bicarbonate: 29.8 mmol/L — ABNORMAL HIGH (ref 20.0–28.0)
Drawn by: 20012
FIO2: 32 %
O2 Saturation: 99.7 %
Patient temperature: 36.7
pCO2 arterial: 46 mmHg (ref 32–48)
pH, Arterial: 7.41 (ref 7.35–7.45)
pO2, Arterial: 138 mmHg — ABNORMAL HIGH (ref 83–108)

## 2022-09-16 LAB — BASIC METABOLIC PANEL
Anion gap: 8 (ref 5–15)
BUN: 25 mg/dL — ABNORMAL HIGH (ref 8–23)
CO2: 27 mmol/L (ref 22–32)
Calcium: 9.3 mg/dL (ref 8.9–10.3)
Chloride: 107 mmol/L (ref 98–111)
Creatinine, Ser: 0.68 mg/dL (ref 0.44–1.00)
GFR, Estimated: >60 mL/min (ref >60)
Glucose, Bld: 120 mg/dL — ABNORMAL HIGH (ref 70–99)
Potassium: 3.4 mmol/L — ABNORMAL LOW (ref 3.5–5.1)
Sodium: 142 mmol/L (ref 135–145)

## 2022-09-16 LAB — COMPREHENSIVE METABOLIC PANEL
ALT: 43 U/L (ref 0–44)
AST: 33 U/L (ref 15–41)
Albumin: 3.2 g/dL — ABNORMAL LOW (ref 3.5–5.0)
Alkaline Phosphatase: 51 U/L (ref 38–126)
Anion gap: 9 (ref 5–15)
BUN: 31 mg/dL — ABNORMAL HIGH (ref 8–23)
CO2: 26 mmol/L (ref 22–32)
Calcium: 9.2 mg/dL (ref 8.9–10.3)
Chloride: 105 mmol/L (ref 98–111)
Creatinine, Ser: 0.65 mg/dL (ref 0.44–1.00)
GFR, Estimated: >60 mL/min (ref >60)
Glucose, Bld: 103 mg/dL — ABNORMAL HIGH (ref 70–99)
Potassium: 2.5 mmol/L — CL (ref 3.5–5.1)
Sodium: 140 mmol/L (ref 135–145)
Total Bilirubin: 0.6 mg/dL (ref 0.3–1.2)
Total Protein: 6.5 g/dL (ref 6.5–8.1)

## 2022-09-16 LAB — AMMONIA: Ammonia: 12 umol/L (ref 9–35)

## 2022-09-16 LAB — TSH: TSH: 0.902 u[IU]/mL (ref 0.350–4.500)

## 2022-09-16 LAB — MAGNESIUM: Magnesium: 2.3 mg/dL (ref 1.7–2.4)

## 2022-09-16 LAB — MRSA NEXT GEN BY PCR, NASAL: MRSA by PCR Next Gen: DETECTED — AB

## 2022-09-16 MED ORDER — ORAL CARE MOUTH RINSE: 15.0000 mL | OROMUCOSAL | Status: DC | PRN

## 2022-09-16 MED ORDER — POTASSIUM CHLORIDE 10 MEQ/100ML IV SOLN
10.0000 meq | INTRAVENOUS | Status: DC
  Filled 2022-09-16: qty 100

## 2022-09-16 MED ORDER — POTASSIUM CHLORIDE CRYS ER 20 MEQ PO TBCR: 40.0000 meq | EXTENDED_RELEASE_TABLET | ORAL | Status: AC

## 2022-09-16 MED ORDER — LORAZEPAM 2 MG/ML IJ SOLN
0.5000 mg | Freq: Four times a day (QID) | INTRAMUSCULAR | Status: DC | PRN
  Administered 2022-09-16 – 2022-09-17 (×2): 0.5 mg via INTRAVENOUS
  Filled 2022-09-16 (×2): qty 1

## 2022-09-16 MED ORDER — SORBITOL 70 % SOLN
300.0000 mL | TOPICAL_OIL | Freq: Once | ORAL | Status: AC
  Administered 2022-09-16: 300 mL via RECTAL
  Filled 2022-09-16: qty 90

## 2022-09-16 MED ORDER — IPRATROPIUM-ALBUTEROL 0.5-2.5 (3) MG/3ML IN SOLN
3.0000 mL | Freq: Four times a day (QID) | RESPIRATORY_TRACT | Status: DC
  Administered 2022-09-16 (×2): 3 mL via RESPIRATORY_TRACT
  Filled 2022-09-16 (×2): qty 3

## 2022-09-16 MED ORDER — MUPIROCIN 2 % EX OINT
1.0000 | TOPICAL_OINTMENT | Freq: Two times a day (BID) | CUTANEOUS | Status: AC
  Administered 2022-09-16 – 2022-09-20 (×10): 1 via NASAL
  Filled 2022-09-16 (×3): qty 22

## 2022-09-16 MED ORDER — POTASSIUM CHLORIDE 10 MEQ/100ML IV SOLN
10.0000 meq | INTRAVENOUS | Status: AC
  Administered 2022-09-16 (×4): 10 meq via INTRAVENOUS
  Filled 2022-09-16 (×4): qty 100

## 2022-09-16 MED ORDER — THIAMINE HCL 100 MG/ML IJ SOLN
500.0000 mg | Freq: Every day | INTRAVENOUS | Status: AC
  Administered 2022-09-16 – 2022-09-18 (×3): 500 mg via INTRAVENOUS
  Filled 2022-09-16 (×3): qty 5

## 2022-09-16 MED ORDER — HYDRALAZINE HCL 20 MG/ML IJ SOLN
5.0000 mg | Freq: Four times a day (QID) | INTRAMUSCULAR | Status: DC | PRN
  Administered 2022-09-16 – 2022-09-20 (×8): 5 mg via INTRAVENOUS
  Filled 2022-09-16 (×8): qty 1

## 2022-09-16 MED ORDER — BUDESONIDE 0.25 MG/2ML IN SUSP
0.2500 mg | Freq: Two times a day (BID) | RESPIRATORY_TRACT | Status: DC
  Administered 2022-09-16 – 2022-09-17 (×4): 0.25 mg via RESPIRATORY_TRACT
  Filled 2022-09-16 (×4): qty 2

## 2022-09-16 MED ORDER — IPRATROPIUM-ALBUTEROL 0.5-2.5 (3) MG/3ML IN SOLN
3.0000 mL | Freq: Three times a day (TID) | RESPIRATORY_TRACT | Status: DC
  Administered 2022-09-17 – 2022-09-20 (×10): 3 mL via RESPIRATORY_TRACT
  Filled 2022-09-16 (×10): qty 3

## 2022-09-16 MED ORDER — CHLORHEXIDINE GLUCONATE CLOTH 2 % EX PADS
6.0000 | MEDICATED_PAD | Freq: Every day | CUTANEOUS | Status: DC
  Administered 2022-09-16 – 2022-09-18 (×3): 6 via TOPICAL

## 2022-09-16 MED ORDER — BISACODYL 10 MG RE SUPP
10.0000 mg | Freq: Once | RECTAL | Status: AC
  Administered 2022-09-16: 10 mg via RECTAL
  Filled 2022-09-16: qty 1

## 2022-09-16 MED ORDER — ENOXAPARIN SODIUM 40 MG/0.4ML IJ SOSY
40.0000 mg | PREFILLED_SYRINGE | INTRAMUSCULAR | Status: DC
  Administered 2022-09-16 – 2022-09-22 (×7): 40 mg via SUBCUTANEOUS
  Filled 2022-09-16 (×7): qty 0.4

## 2022-09-16 MED ORDER — IPRATROPIUM-ALBUTEROL 0.5-2.5 (3) MG/3ML IN SOLN: 3.0000 mL | Freq: Four times a day (QID) | RESPIRATORY_TRACT | Status: DC | PRN

## 2022-09-16 MED ORDER — POTASSIUM CHLORIDE 10 MEQ/100ML IV SOLN
10.0000 meq | INTRAVENOUS | Status: AC
  Administered 2022-09-16 (×4): 10 meq via INTRAVENOUS
  Filled 2022-09-16 (×3): qty 100

## 2022-09-16 MED ORDER — POTASSIUM CHLORIDE IN NACL 20-0.9 MEQ/L-% IV SOLN
INTRAVENOUS | Status: DC
  Filled 2022-09-16 (×2): qty 1000

## 2022-09-16 NOTE — Progress Notes (Signed)
PROGRESS NOTE    Leslie Farley  OAC:[id number] DOB: [birth year] DOA: 09/15/2022 PCP: Pcp, No   Brief Narrative: 89 year old French-speaking female with medical history significant for  dementia, worse over last 6 months, CAD, asthma, COPD, GERD, hypertension, chronic anemia, history of cholecystectomy admitted early February this year for Esther coral colitis, severe constipation and fecal impaction presented to the ED from nursing home for evaluation of abdominal pain and constipation.  CT abdomen and pelvis show patchy airspace opacity in the lower lobe bilaterally new from previous exam and felt to be postinflammatory in nature.  Diverticulosis without diverticulitis, mild distention of the colon with air and fluid within no obstructive changes are seen.  No a small bowel obstruction is noted.  She develops worsening BL wheezing and dyspnea, and Hypoxia, Oxygen sat drop to 80 % on RA> she was transfer to step down for closer monitoring.    Assessment & Plan:   Principal Problem:   Abdominal pain Active Problems:   COPD (chronic obstructive pulmonary disease) (HCC)   Constipation   Leukocytosis   AKI (acute kidney injury) (HCC)   Elevated transaminase level  1-Acute Hypoxic respiratory Failure.  Asthma/COPD In setting on asthma vs hypoventilation from abdominal process.  Schedule duo-Nebulizer. Pulmicort.  IV fluids discontinue to avoid pulmonary edema.  Check ABG.  Chest x ray showed hypoventilation.  Transfer to step down unit.   2-Abdominal pain, colonic ileum She is more tender and distended this am. Surgery has been consul;ted KUB; showed colonic distension.  GI has been consulted.  On clear diet.  Replete electrolytes./  On Bowel regimen.  She had BM , small this am. And report of  2 BM yesterday night.    3-Leukocytosis;  CT abdomen pelvis negative. Patchy opacities lower lobes.   4-AKI;  Presents with cr 1.1 Previous baseline 0.6--0.9 Received IV fluids.   Distended bladder on CT. She has been urinating. Monitor Bladder scan   5-Hypokalemia; replete IV and orally.    Mild transaminases.  Status postcholecystectomy.  CT abdomen showed no focal liver abnormality. Follow trends.  Could be in the setting of dehydration  Hypertension continue to hold hydrochlorothiazide and losartan due to mild AKI.  As needed hydralazine.  Dementia: Worse over the last 6 months per family. Plan to check TSH, B12, thiamine    Estimated body mass index is 30.23 kg/m as calculated from the following:   Height as of 07/21/16: 5\' 5"  (1.651 m).   Weight as of this encounter: 82.4 kg.   DVT prophylaxis: Lovenox Code Status: Full code Family Communication: Daughte over phone and at bedside.  Disposition Plan:  Status is: Observation The patient will require care spanning > 2 midnights and should be moved to inpatient because: colonic ileus    Consultants:  Surgery GI  Procedures:    Antimicrobials:    Subjective: She is moaning, she is in distress. BL wheezing  She has dementia. Per nurse family relates patient is not able to communicate even with translator.    Objective: Vitals:   09/15/22 2155 09/15/22 2200 09/15/22 2233 09/16/22 0556  BP: (!) 165/63 (!) 154/62 (!) 176/61 (!) 175/66  Pulse: 72  78 67  Resp: 18  (!) 24 18  Temp:   98.2 F (36.8 C) 98.6 F (37 C)  TempSrc:   Oral Oral  SpO2: 98%  92% 95%  Weight:   82.4 kg     Intake/Output Summary (Last 24 hours) at 09/16/2022 0109 Last data filed at 09/16/2022  3086 Gross per 24 hour  Intake 627.37 ml  Output --  Net 627.37 ml   Filed Weights   09/15/22 2233  Weight: 82.4 kg    Examination:  General exam: Appears calm and comfortable  Respiratory system: tachypnea, BL expiratory wheezing.  Cardiovascular system: S1 & S2 heard, RRR. Gastrointestinal system: Abdomen is distended, very tender, tight.  Central nervous system: Alert  Extremities: Symmetric 5 x 5  power.    Data Reviewed: I have personally reviewed following labs and imaging studies  CBC: Recent Labs  Lab 09/15/22 1600 09/16/22 0612  WBC 15.1* 10.1  NEUTROABS 12.3*  --   HGB 11.4* 10.5*  HCT 35.9* 32.6*  MCV 98.9 98.8  PLT 219 214   Basic Metabolic Panel: Recent Labs  Lab 09/15/22 1600 09/16/22 0612  NA 139 140  K 3.7 2.5*  CL 101 105  CO2 30 26  GLUCOSE 118* 103*  BUN 40* 31*  CREATININE 1.12* 0.65  CALCIUM 9.6 9.2   GFR: CrCl cannot be calculated (Unknown ideal weight.). Liver Function Tests: Recent Labs  Lab 09/15/22 1600 09/16/22 0612  AST 48* 33  ALT 55* 43  ALKPHOS 60 51  BILITOT 0.7 0.6  PROT 7.2 6.5  ALBUMIN 3.5 3.2*   Recent Labs  Lab 09/15/22 1600  LIPASE 32   No results for input(s): "AMMONIA" in the last 168 hours. Coagulation Profile: No results for input(s): "INR", "PROTIME" in the last 168 hours. Cardiac Enzymes: No results for input(s): "CKTOTAL", "CKMB", "CKMBINDEX", "TROPONINI" in the last 168 hours. BNP (last 3 results) No results for input(s): "PROBNP" in the last 8760 hours. HbA1C: No results for input(s): "HGBA1C" in the last 72 hours. CBG: No results for input(s): "GLUCAP" in the last 168 hours. Lipid Profile: No results for input(s): "CHOL", "HDL", "LDLCALC", "TRIG", "CHOLHDL", "LDLDIRECT" in the last 72 hours. Thyroid Function Tests: No results for input(s): "TSH", "T4TOTAL", "FREET4", "T3FREE", "THYROIDAB" in the last 72 hours. Anemia Panel: No results for input(s): "VITAMINB12", "FOLATE", "FERRITIN", "TIBC", "IRON", "RETICCTPCT" in the last 72 hours. Sepsis Labs: No results for input(s): "PROCALCITON", "LATICACIDVEN" in the last 168 hours.  Recent Results (from the past 240 hour(s))  MRSA Next Gen by PCR, Nasal     Status: Abnormal   Collection Time: 09/16/22  3:17 AM   Specimen: Nasal Mucosa; Nasal Swab  Result Value Ref Range Status   MRSA by PCR Next Gen DETECTED (A) NOT DETECTED Final    Comment:  (NOTE) The GeneXpert MRSA Assay (FDA approved for NASAL specimens only), is one component of a comprehensive MRSA colonization surveillance program. It is not intended to diagnose MRSA infection nor to guide or monitor treatment for MRSA infections. Test performance is not FDA approved in patients less than 12 years old. Performed at [facility], [address] [place], [place] [address]          Radiology Studies: CT ABDOMEN PELVIS W CONTRAST  Result Date: 09/15/2022 CLINICAL DATA:  Abdominal pain and swelling encounter EXAM: CT ABDOMEN AND PELVIS WITH CONTRAST TECHNIQUE: Multidetector CT imaging of the abdomen and pelvis was performed using the standard protocol following bolus administration of intravenous contrast. RADIATION DOSE REDUCTION: This exam was performed according to the departmental dose-optimization program which includes automated exposure control, adjustment of the mA and/or kV according to patient size and/or use of iterative reconstruction technique. CONTRAST:  OMNIPAQUE IOHEXOL 300 MG/ML  SOLN COMPARISON:  07/21/2022 FINDINGS: Lower chest: Patchy airspace opacities are noted in  the lower lobes bilaterally likely postinflammatory in nature. These are new from prior exam. Hepatobiliary: No focal liver abnormality is seen. Status post cholecystectomy. No biliary dilatation. Pancreas: Unremarkable. No pancreatic ductal dilatation or surrounding inflammatory changes. Spleen: Normal in size without focal abnormality. Adrenals/Urinary Tract: Adrenal glands are within normal limits. Kidneys demonstrate a normal enhancement pattern bilaterally. No renal calculi or obstructive changes are seen. Normal excretion is noted on delayed images. The bladder is well distended. Stomach/Bowel: Scattered diverticular change of the colon is noted. No diverticulitis is seen. Distension of the colon is seen with both air and fluid particularly in the sigmoid although no  findings to suggest volvulus are noted. The appendix is within normal limits. No obstructive or inflammatory changes of the small bowel are seen. Stomach is within normal limits. Vascular/Lymphatic: Aortic atherosclerosis. No enlarged abdominal or pelvic lymph nodes. Reproductive: Uterus and bilateral adnexa are unremarkable. Other: No abdominal wall hernia or abnormality. No abdominopelvic ascites. Musculoskeletal: Degenerative changes of lumbar spine are noted. IMPRESSION: Patchy airspace opacities in the lower lobes bilaterally new from the prior exam and felt to be postinflammatory in nature. Diverticulosis without diverticulitis. Mild distension of the colon with air and fluid although no obstructive changes are seen. No small bowel obstruction is noted. Electronically Signed   By: Alcide Clever M.D.   On: 09/15/2022 20:04   DG Abd Portable 1 View  Result Date: 09/15/2022 CLINICAL DATA:  Constipation distension EXAM: PORTABLE ABDOMEN - 1 VIEW COMPARISON:  CT 07/21/2022 FINDINGS: Moderate air distension the colon with some air-filled small bowel. Gas down to the rectum. Mild stool in the upper quadrants. Numerous lucent centered benign appearing soft tissue calcifications IMPRESSION: Moderate air distension of colon with some air-filled small bowel, question ileus Electronically Signed   By: Jasmine Pang M.D.   On: 09/15/2022 17:14        Scheduled Meds:  Chlorhexidine Gluconate Cloth  6 each Topical Q0600   enoxaparin (LOVENOX) injection  40 mg Subcutaneous Q24H   mupirocin ointment  1 Application Nasal BID   polyethylene glycol  17 g Oral Daily   potassium chloride  40 mEq Oral Q4H   senna-docusate  2 tablet Oral QHS   Continuous Infusions:  sodium chloride 125 mL/hr at 09/16/22 0338   potassium chloride       LOS: 0 days    Time spent: 35 minutes    Jahziah Simonin A Aranza Geddes, MD [facility]   If 7PM-7AM, please contact night-coverage [email or web address]  09/16/2022, 8:43 AM

## 2022-09-16 NOTE — Consult Note (Signed)
Consult Note  Leslie Farley 1931-01-01  [id number].    Requesting MD: Hartley Barefoot, MD Chief Complaint/Reason for Consult: abdominal distention   HPI:  Patient is an 89 year old female with hx of chronic constipation, admitted earlier this year with fecal impaction and stercoral colitis, who presented to the ED yesterday with constipation and abdominal distention. Workup at that time suggestive of ileus. No pain or nausea/vomiting. No fevers. She had some treatment for constipation prior to presentation and did have some bowel function. Since being admitted she has also had more bowel function. Unable to tell if she is passing flatus and patient has advanced dementia and is mostly nonverbal. She is nonambulatory and lives in a facility at baseline. Appears to have had an open cholecystectomy at some point. NKDA. Not on blood thinners. General surgery consulted for ongoing abdominal distention and pain.   ROS: Review of Systems  Unable to perform ROS: Dementia    Family History  Problem Relation Age of Onset   Cancer - Other Mother        Died of throat cancer   Cancer - Prostate Father    Diabetes Brother     Past Medical History:  Diagnosis Date   Arthritis    Asthma    COPD (chronic obstructive pulmonary disease) (HCC)    GERD (gastroesophageal reflux disease)    Hypertension     Past Surgical History:  Procedure Laterality Date   CHOLECYSTECTOMY      Social History:  reports that she quit smoking about 51 years ago. She has never used smokeless tobacco. She reports that she does not drink alcohol and does not use drugs.  Allergies:  Allergies  Allergen Reactions   Cucumber Extract     Medications Prior to Admission  Medication Sig Dispense Refill   acetaminophen (TYLENOL) 500 MG tablet Take 2 tablets (1,000 mg total) by mouth every 6 (six) hours as needed for mild pain or moderate pain. 30 tablet 0   acetaminophen (TYLENOL) 650 MG CR tablet Take  650 mg by mouth in the morning and at bedtime.     ADVAIR DISKUS 100-50 MCG/ACT AEPB Inhale 1 puff into the lungs every 12 (twelve) hours.     albuterol (VENTOLIN HFA) 108 (90 Base) MCG/ACT inhaler Inhale 2 puffs into the lungs every 6 (six) hours as needed for wheezing or shortness of breath.     bisacodyl (DULCOLAX) 10 MG suppository Place 10 mg rectally daily as needed for moderate constipation.     cholecalciferol (VITAMIN D3) 25 MCG (1000 UNIT) tablet Take 2,000 Units by mouth daily.     cycloSPORINE (RESTASIS) 0.05 % ophthalmic emulsion Place 1 drop into both eyes 2 (two) times daily.     famotidine (PEPCID) 20 MG tablet Take 1 tablet (20 mg total) by mouth daily.     hydrochlorothiazide (MICROZIDE) 12.5 MG capsule Take 12.5 mg by mouth daily.     losartan (COZAAR) 100 MG tablet Take 100 mg by mouth daily.     magnesium hydroxide (MILK OF MAGNESIA) 400 MG/5ML suspension Take 30 mLs by mouth daily as needed for mild constipation.     melatonin 3 MG TABS tablet Take 6 mg by mouth at bedtime.     Multiple Vitamins-Minerals (CERTAVITE/ANTIOXIDANTS PO) Take 1 tablet by mouth daily.     nitroGLYCERIN (NITROSTAT) 0.4 MG SL tablet Place 0.4 mg under the tongue every 5 (five) minutes as needed for chest pain (Do not exceed 3  doses per episode.).     polyethylene glycol (MIRALAX / GLYCOLAX) packet Take 17 g by mouth daily.     potassium chloride SA (KLOR-CON M) 20 MEQ tablet Take 2 tablets (40 mEq total) by mouth daily for 4 days. 8 tablet 0   Propylene Glycol (SYSTANE BALANCE) 0.6 % SOLN Place 1 drop into both eyes in the morning and at bedtime.     Psyllium (FP FIBER LAXATIVE PO) Take 500 mg by mouth 2 (two) times daily.     QUEtiapine (SEROQUEL) 25 MG tablet Take 25 mg by mouth at bedtime.     senna-docusate (SENOKOT-S) 8.6-50 MG tablet Take 2 tablets by mouth at bedtime.     simethicone (MYLICON) 125 MG chewable tablet Chew 125 mg by mouth in the morning, at noon, and at bedtime.     Sodium  Phosphates (RA SALINE ENEMA RE) Place 1 Bottle rectally daily as needed (constipation).     tiZANidine (ZANAFLEX) 2 MG tablet Take 1 tablet (2 mg total) by mouth at bedtime. After completing cipro 30 tablet 0    Blood pressure (!) 153/106, pulse 72, temperature 98 F (36.7 C), resp. rate 18, weight 82.4 kg, SpO2 100%. Physical Exam:  General: WD, elderly female who is laying in bed in NAD HEENT: head is normocephalic, atraumatic.  Sclera are noninjected.  PERRL.  Ears and nose without any masses or lesions.  Mouth is pink and moist Heart: regular, rate, and rhythm. Palpable radial and pedal pulses bilaterally Lungs: mild tachypnea, O2 saturation 100% on 3L via [place] Abd: distended, soft, no peritonitis, RUQ surgical scar, diastasis but no hernia palpated    Results for orders placed or performed during the hospital encounter of 09/15/22 (from the past 48 hour(s))  Comprehensive metabolic panel     Status: Abnormal   Collection Time: 09/15/22  4:00 PM  Result Value Ref Range   Sodium 139 135 - 145 mmol/L   Potassium 3.7 3.5 - 5.1 mmol/L   Chloride 101 98 - 111 mmol/L   CO2 30 22 - 32 mmol/L   Glucose, Bld 118 (H) 70 - 99 mg/dL    Comment: Glucose reference range applies only to samples taken after fasting for at least 8 hours.   BUN 40 (H) 8 - 23 mg/dL   Creatinine, Ser 1.30 (H) 0.44 - 1.00 mg/dL   Calcium 9.6 8.9 - 86.5 mg/dL   Total Protein 7.2 6.5 - 8.1 g/dL   Albumin 3.5 3.5 - 5.0 g/dL   AST 48 (H) 15 - 41 U/L   ALT 55 (H) 0 - 44 U/L   Alkaline Phosphatase 60 38 - 126 U/L   Total Bilirubin 0.7 0.3 - 1.2 mg/dL   GFR, Estimated 47 (L) >60 mL/min    Comment: (NOTE) Calculated using the CKD-EPI Creatinine Equation (2021)    Anion gap 8 5 - 15    Comment: Performed at [facility], [address], [place], [place] [address]  Lipase, blood     Status: None   Collection Time: 09/15/22  4:00 PM  Result Value Ref Range   Lipase 32 11 - 51 U/L    Comment: Performed  at [facility], [address] [place], [place] [address]  CBC with Diff     Status: Abnormal   Collection Time: 09/15/22  4:00 PM  Result Value Ref Range   WBC 15.1 (H) 4.0 - 10.5 K/uL   RBC 3.63 (L) 3.87 - 5.11 MIL/uL  Hemoglobin 11.4 (L) 12.0 - 15.0 g/dL   HCT 84.1 (L) 32.4 - 40.1 %   MCV 98.9 80.0 - 100.0 fL   MCH 31.4 26.0 - 34.0 pg   MCHC 31.8 30.0 - 36.0 g/dL   RDW 02.7 25.3 - 66.4 %   Platelets 219 150 - 400 K/uL   nRBC 0.0 0.0 - 0.2 %   Neutrophils Relative % 81 %   Neutro Abs 12.3 (H) 1.7 - 7.7 K/uL   Lymphocytes Relative 13 %   Lymphs Abs 1.9 0.7 - 4.0 K/uL   Monocytes Relative 6 %   Monocytes Absolute 0.9 0.1 - 1.0 K/uL   Eosinophils Relative 0 %   Eosinophils Absolute 0.1 0.0 - 0.5 K/uL   Basophils Relative 0 %   Basophils Absolute 0.0 0.0 - 0.1 K/uL   Immature Granulocytes 0 %   Abs Immature Granulocytes 0.04 0.00 - 0.07 K/uL    Comment: Performed at [facility], [address], [place], [place] [address]  MRSA Next Gen by PCR, Nasal     Status: Abnormal   Collection Time: 09/16/22  3:17 AM   Specimen: Nasal Mucosa; Nasal Swab  Result Value Ref Range   MRSA by PCR Next Gen DETECTED (A) NOT DETECTED    Comment: (NOTE) The GeneXpert MRSA Assay (FDA approved for NASAL specimens only), is one component of a comprehensive MRSA colonization surveillance program. It is not intended to diagnose MRSA infection nor to guide or monitor treatment for MRSA infections. Test performance is not FDA approved in patients less than 41 years old. Performed at [facility], [address] [place], [place] [address]   CBC     Status: Abnormal   Collection Time: 09/16/22  6:12 AM  Result Value Ref Range   WBC 10.1 4.0 - 10.5 K/uL   RBC 3.30 (L) 3.87 - 5.11 MIL/uL   Hemoglobin 10.5 (L) 12.0 - 15.0 g/dL   HCT 63.8 (L) 75.6 - 43.3 %   MCV 98.8 80.0 - 100.0 fL   MCH 31.8 26.0 - 34.0 pg   MCHC 32.2 30.0 - 36.0 g/dL    RDW 29.5 18.8 - 41.6 %   Platelets 214 150 - 400 K/uL   nRBC 0.0 0.0 - 0.2 %    Comment: Performed at [facility], [address], [place], [place] [address]  Comprehensive metabolic panel     Status: Abnormal   Collection Time: 09/16/22  6:12 AM  Result Value Ref Range   Sodium 140 135 - 145 mmol/L   Potassium 2.5 (LL) 3.5 - 5.1 mmol/L    Comment: CRITICAL RESULT CALLED TO, READ BACK BY AND VERIFIED WITH SINATRA,M. RN AT 1601 09/16/22 MULLINS,T    Chloride 105 98 - 111 mmol/L   CO2 26 22 - 32 mmol/L   Glucose, Bld 103 (H) 70 - 99 mg/dL    Comment: Glucose reference range applies only to samples taken after fasting for at least 8 hours.   BUN 31 (H) 8 - 23 mg/dL   Creatinine, Ser 0.93 0.44 - 1.00 mg/dL   Calcium 9.2 8.9 - 23.5 mg/dL   Total Protein 6.5 6.5 - 8.1 g/dL   Albumin 3.2 (L) 3.5 - 5.0 g/dL   AST 33 15 - 41 U/L   ALT 43 0 - 44 U/L   Alkaline Phosphatase 51 38 - 126 U/L   Total Bilirubin 0.6 0.3 - 1.2 mg/dL   GFR, Estimated >57 >32 mL/min  Comment: (NOTE) Calculated using the CKD-EPI Creatinine Equation (2021)    Anion gap 9 5 - 15    Comment: Performed at [facility], [address] [place], [place] [address]  Blood gas, arterial     Status: Abnormal   Collection Time: 09/16/22 11:12 AM  Result Value Ref Range   FIO2 32 %   pH, Arterial 7.41 7.35 - 7.45   pCO2 arterial 46 32 - 48 mmHg   pO2, Arterial 138 (H) 83 - 108 mmHg   Bicarbonate 29.8 (H) 20.0 - 28.0 mmol/L   Acid-Base Excess 4.4 (H) 0.0 - 2.0 mmol/L   O2 Saturation 99.7 %   Patient temperature 36.7    Collection site RIGHT RADIAL    Drawn by [id number]    Allens test (pass/fail) PASS PASS    Comment: Performed at [facility], [address] [place], [place] [address]   DG CHEST PORT 1 VIEW  Result Date: 09/16/2022 CLINICAL DATA:  Shortness of breath with hypoxia and wheezing. History of asthma. EXAM: PORTABLE CHEST 1 VIEW COMPARISON:   Radiographs 03/29/2022 and 10/30/2014. Chest CTA 10/30/2014. FINDINGS: 1031 hours. Mild patient rotation to the right and low lung volumes. The heart size and mediastinal contours are stable. Minimal opacity medially at the right lung base, likely atelectasis. No confluent airspace disease, edema, pleural effusion or pneumothorax. The bones appear unremarkable. IMPRESSION: Low lung volumes with mild right basilar atelectasis. No evidence of pneumonia or edema. Electronically Signed   By: Carey Bullocks M.D.   On: 09/16/2022 11:27   DG Abd 1 View  Result Date: 09/16/2022 CLINICAL DATA:  Constipation, asthma, hypertension EXAM: ABDOMEN - 1 VIEW COMPARISON:  CT abdomen/pelvis and KUB 1 day prior FINDINGS: Diffuse gaseous distention of the colon is again seen, grossly similar to the prior CT. There is no definite free intraperitoneal air, within the confines of supine technique. Contrast is seen in the bladder from the prior CT. IMPRESSION: Diffuse gaseous distention of the colon is grossly similar to the prior CT. Electronically Signed   By: Lesia Hausen M.D.   On: 09/16/2022 11:24   CT ABDOMEN PELVIS W CONTRAST  Result Date: 09/15/2022 CLINICAL DATA:  Abdominal pain and swelling encounter EXAM: CT ABDOMEN AND PELVIS WITH CONTRAST TECHNIQUE: Multidetector CT imaging of the abdomen and pelvis was performed using the standard protocol following bolus administration of intravenous contrast. RADIATION DOSE REDUCTION: This exam was performed according to the departmental dose-optimization program which includes automated exposure control, adjustment of the mA and/or kV according to patient size and/or use of iterative reconstruction technique. CONTRAST:  OMNIPAQUE IOHEXOL 300 MG/ML  SOLN COMPARISON:  07/21/2022 FINDINGS: Lower chest: Patchy airspace opacities are noted in the lower lobes bilaterally likely postinflammatory in nature. These are new from prior exam. Hepatobiliary: No focal liver abnormality is  seen. Status post cholecystectomy. No biliary dilatation. Pancreas: Unremarkable. No pancreatic ductal dilatation or surrounding inflammatory changes. Spleen: Normal in size without focal abnormality. Adrenals/Urinary Tract: Adrenal glands are within normal limits. Kidneys demonstrate a normal enhancement pattern bilaterally. No renal calculi or obstructive changes are seen. Normal excretion is noted on delayed images. The bladder is well distended. Stomach/Bowel: Scattered diverticular change of the colon is noted. No diverticulitis is seen. Distension of the colon is seen with both air and fluid particularly in the sigmoid although no findings to suggest volvulus are noted. The appendix is within normal limits. No obstructive or inflammatory changes of the small bowel are  seen. Stomach is within normal limits. Vascular/Lymphatic: Aortic atherosclerosis. No enlarged abdominal or pelvic lymph nodes. Reproductive: Uterus and bilateral adnexa are unremarkable. Other: No abdominal wall hernia or abnormality. No abdominopelvic ascites. Musculoskeletal: Degenerative changes of lumbar spine are noted. IMPRESSION: Patchy airspace opacities in the lower lobes bilaterally new from the prior exam and felt to be postinflammatory in nature. Diverticulosis without diverticulitis. Mild distension of the colon with air and fluid although no obstructive changes are seen. No small bowel obstruction is noted. Electronically Signed   By: Alcide Clever M.D.   On: 09/15/2022 20:04   DG Abd Portable 1 View  Result Date: 09/15/2022 CLINICAL DATA:  Constipation distension EXAM: PORTABLE ABDOMEN - 1 VIEW COMPARISON:  CT 07/21/2022 FINDINGS: Moderate air distension the colon with some air-filled small bowel. Gas down to the rectum. Mild stool in the upper quadrants. Numerous lucent centered benign appearing soft tissue calcifications IMPRESSION: Moderate air distension of colon with some air-filled small bowel, question ileus  Electronically Signed   By: Jasmine Pang M.D.   On: 09/15/2022 17:14      Assessment/Plan Chronic constipation - admitted in February 2024 with stercoral colitis and fecal impaction   Likely Colonic ileus  - recommended GI consult and Dr. Dulce Sellar has seen already  - CT without obstruction or transition point - distention of the colon with air and fluid  - KUB this AM with diffuse gaseous distention of the colon  - K 2.5, Mg not checked - recommend K>4.0 and Mg>2.0 to optimize bowel function  - discussed with daughter that patient does not have any acute surgical issues at this time - no acute surgical indications - management of constipation and colonic ileus per GI. Surgery will not follow acutely but are available if needed.   FEN: CLD - would recommend NPO and bowel rest with current distention VTE: LMWH ID: none  - per primary -  Advanced dementia  COPD CAD HTN GERD Chronic anemia   I reviewed hospitalist notes, last 24 h vitals and pain scores, last 48 h intake and output, last 24 h labs and trends, and last 24 h imaging results.  Juliet Rude, [facility] 09/16/2022, 12:38 PM Please see Amion for pager number during day hours 7:00am-4:30pm

## 2022-09-16 NOTE — Consult Note (Signed)
[facility] Consultation Note  Referring Provider: [facility] Primary Care Physician:  Pcp, No Primary Gastroenterologist:  Gentry Fitz  Reason for Consultation:  abdominal distention  HPI: Leslie Farley is a 89 y.o. female French-only speaking patient presenting from nursing facility with abdominal pain and abdominal distention.  Patient with vacillating consciousness and unable to carry on conversation.  All history obtained from patient's daughter, [name].  Patient apparently with some progressively worsening constipation and abdominal discomfort.  Per Nathalie's description, patient has required manual disimpaction by nursing staff.  No obvious bleeding.   Past Medical History:  Diagnosis Date   Arthritis    Asthma    COPD (chronic obstructive pulmonary disease) (HCC)    GERD (gastroesophageal reflux disease)    Hypertension     Past Surgical History:  Procedure Laterality Date   CHOLECYSTECTOMY      Prior to Admission medications   Medication Sig Start Date End Date Taking? Authorizing Provider  acetaminophen (TYLENOL) 500 MG tablet Take 2 tablets (1,000 mg total) by mouth every 6 (six) hours as needed for mild pain or moderate pain. 11/20/16   Elson Areas, PA-C  acetaminophen (TYLENOL) 650 MG CR tablet Take 650 mg by mouth in the morning and at bedtime.    [provider]  ADVAIR DISKUS 100-50 MCG/ACT AEPB Inhale 1 puff into the lungs every 12 (twelve) hours. 03/26/22   [provider]  albuterol (VENTOLIN HFA) 108 (90 Base) MCG/ACT inhaler Inhale 2 puffs into the lungs every 6 (six) hours as needed for wheezing or shortness of breath. 04/01/22   Pokhrel, Rebekah Chesterfield, MD  bisacodyl (DULCOLAX) 10 MG suppository Place 10 mg rectally daily as needed for moderate constipation.    [provider]  cholecalciferol (VITAMIN D3) 25 MCG (1000 UNIT) tablet Take 2,000 Units by mouth daily.    [provider]  cycloSPORINE (RESTASIS)  0.05 % ophthalmic emulsion Place 1 drop into both eyes 2 (two) times daily.    [provider]  famotidine (PEPCID) 20 MG tablet Take 1 tablet (20 mg total) by mouth daily. 04/02/22   Pokhrel, Rebekah Chesterfield, MD  hydrochlorothiazide (MICROZIDE) 12.5 MG capsule Take 12.5 mg by mouth daily. 03/10/22   [provider]  losartan (COZAAR) 100 MG tablet Take 100 mg by mouth daily. 03/10/22   [provider]  magnesium hydroxide (MILK OF MAGNESIA) 400 MG/5ML suspension Take 30 mLs by mouth daily as needed for mild constipation.    [provider]  melatonin 3 MG TABS tablet Take 6 mg by mouth at bedtime.    [provider]  Multiple Vitamins-Minerals (CERTAVITE/ANTIOXIDANTS PO) Take 1 tablet by mouth daily.    [provider]  nitroGLYCERIN (NITROSTAT) 0.4 MG SL tablet Place 0.4 mg under the tongue every 5 (five) minutes as needed for chest pain (Do not exceed 3 doses per episode.).    [provider]  polyethylene glycol (MIRALAX / GLYCOLAX) packet Take 17 g by mouth daily.    [provider]  potassium chloride SA (KLOR-CON M) 20 MEQ tablet Take 2 tablets (40 mEq total) by mouth daily for 4 days. 07/21/22 07/25/22  Long, Arlyss Repress, MD  Propylene Glycol (SYSTANE BALANCE) 0.6 % SOLN Place 1 drop into both eyes in the morning and at bedtime.    [provider]  Psyllium (FP FIBER LAXATIVE PO) Take 500 mg by mouth 2 (two) times daily.    [provider]  QUEtiapine (SEROQUEL) 25 MG tablet Take 25 mg by  mouth at bedtime.    [provider]  senna-docusate (SENOKOT-S) 8.6-50 MG tablet Take 2 tablets by mouth at bedtime.    [provider]  simethicone (MYLICON) 125 MG chewable tablet Chew 125 mg by mouth in the morning, at noon, and at bedtime.    [provider]  Sodium Phosphates (RA SALINE ENEMA RE) Place 1 Bottle rectally daily as needed (constipation).    [provider]  tiZANidine (ZANAFLEX) 2 MG  tablet Take 1 tablet (2 mg total) by mouth at bedtime. After completing cipro 04/06/22   Pokhrel, Rebekah Chesterfield, MD    Current Facility-Administered Medications  Medication Dose Route Frequency Provider Last Rate Last Admin   0.9 % NaCl with KCl 20 mEq/ L  infusion   Intravenous Continuous Regalado, Belkys A, MD 50 mL/hr at 09/16/22 1432 New Bag at 09/16/22 1432   acetaminophen (TYLENOL) tablet 650 mg  650 mg Oral Q6H PRN Regalado, Belkys A, MD   650 mg at 09/15/22 2258   Or   acetaminophen (TYLENOL) suppository 650 mg  650 mg Rectal Q6H PRN Regalado, Belkys A, MD       bisacodyl (DULCOLAX) suppository 10 mg  10 mg Rectal Daily PRN Regalado, Belkys A, MD       budesonide (PULMICORT) nebulizer solution 0.25 mg  0.25 mg Nebulization BID Regalado, Belkys A, MD   0.25 mg at 09/16/22 1027   Chlorhexidine Gluconate Cloth 2 % PADS 6 each  6 each Topical Q0600 Regalado, Belkys A, MD   6 each at 09/16/22 1439   enoxaparin (LOVENOX) injection 40 mg  40 mg Subcutaneous Q24H Regalado, Belkys A, MD   40 mg at 09/16/22 1057   hydrALAZINE (APRESOLINE) injection 5 mg  5 mg Intravenous Q6H PRN Regalado, Belkys A, MD   5 mg at 09/16/22 0610   ipratropium-albuterol (DUONEB) 0.5-2.5 (3) MG/3ML nebulizer solution 3 mL  3 mL Nebulization Q6H PRN Regalado, Belkys A, MD       ipratropium-albuterol (DUONEB) 0.5-2.5 (3) MG/3ML nebulizer solution 3 mL  3 mL Nebulization Q6H Regalado, Belkys A, MD   3 mL at 09/16/22 1029   mupirocin ointment (BACTROBAN) 2 % 1 Application  1 Application Nasal BID Regalado, Belkys A, MD   1 Application at 09/16/22 1053   Oral care mouth rinse  15 mL Mouth Rinse PRN Regalado, Belkys A, MD       polyethylene glycol (MIRALAX / GLYCOLAX) packet 17 g  17 g Oral Daily Regalado, Belkys A, MD   17 g at 09/16/22 1342   potassium chloride 10 mEq in 100 mL IVPB  10 mEq Intravenous Q1 Hr x 4 Regalado, Belkys A, MD 100 mL/hr at 09/16/22 1439 Infusion Verify at 09/16/22 1439   potassium chloride 10 mEq in 100 mL  IVPB  10 mEq Intravenous Q1 Hr x 4 Regalado, Belkys A, MD       potassium chloride SA (KLOR-CON M) CR tablet 40 mEq  40 mEq Oral Q4H Regalado, Belkys A, MD       senna-docusate (Senokot-S) tablet 2 tablet  2 tablet Oral QHS Regalado, Belkys A, MD   2 tablet at 09/15/22 2258    Allergies as of 09/15/2022 - Review Complete 09/15/2022  Allergen Reaction Noted   Cucumber extract  03/29/2022    Family History  Problem Relation Age of Onset   Cancer - Other Mother        Died of throat cancer   Cancer - Prostate Father  Diabetes Brother     Social History   Socioeconomic History   Marital status: Single    Spouse name: Not on file   Number of children: Not on file   Years of education: Not on file   Highest education level: Not on file  Occupational History   Not on file  Tobacco Use   Smoking status: Former    Current packs/day: 0.00    Types: Cigarettes    Quit date: 02/24/1971    Years since quitting: 51.5   Smokeless tobacco: Never  Substance and Sexual Activity   Alcohol use: No   Drug use: No   Sexual activity: Not Currently  Other Topics Concern   Not on file  Social History Narrative   Not on file   Social Determinants of Health   Financial Resource Strain: Not on file  Food Insecurity: No Food Insecurity (09/15/2022)   Hunger Vital Sign    Worried About Running Out of Food in the Last Year: Never true    Ran Out of Food in the Last Year: Never true  Transportation Needs: No Transportation Needs (09/15/2022)   PRAPARE - Administrator, Civil Service (Medical): No    Lack of Transportation (Non-Medical): No  Physical Activity: Not on file  Stress: Not on file  Social Connections: Not on file  Intimate Partner Violence: Not At Risk (09/15/2022)   Humiliation, Afraid, Rape, and Kick questionnaire    Fear of Current or Ex-Partner: No    Emotionally Abused: No    Physically Abused: No    Sexually Abused: No    Review of Systems: Unable to obtain  due to altered mental status  Physical Exam: Vital signs in last 24 hours: Temp:  [98 F (36.7 C)-98.6 F (37 C)] 98 F (36.7 C) (07/24 1019) Pulse Rate:  [64-78] 72 (07/24 1102) Resp:  [16-24] 18 (07/24 1019) BP: (146-179)/(61-146) 153/106 (07/24 1019) SpO2:  [91 %-100 %] 100 % (07/24 1151) Weight:  [82.4 kg] 82.4 kg (07/23 2233) Last BM Date : 09/16/22 (cleaned up a small loose BM this morning) General:   Awake but not alert, moaning in bed, non-conversant Head:  Normocephalic and atraumatic. Eyes:  Sclera clear, no icterus.   Conjunctiva pink. Ears:  Normal auditory acuity. Nose:  No deformity, discharge,  or lesions. Mouth:  No deformity or lesions.  Oropharynx pink & moist. Neck:  Supple; no masses or thyromegaly. Lungs:  Mild respiratory distress Abdomen:  Soft, mild/moderate distention with tympany; generalized tenderness non-specific; No masses, hepatosplenomegaly or hernias noted. Without guarding, and without rebound.     Msk:  Symmetrical without gross deformities. Normal posture. Pulses:  Normal pulses noted. Extremities:  Without clubbing or edema. Neurologic:  Awake but not alert, non-conversant Skin:  Intact without significant lesions or rashes. Cervical Nodes:  No significant cervical adenopathy. Psych:  Awake but not alert   Lab Results: Recent Labs    09/15/22 1600 09/16/22 0612  WBC 15.1* 10.1  HGB 11.4* 10.5*  HCT 35.9* 32.6*  PLT 219 214   BMET Recent Labs    09/15/22 1600 09/16/22 0612  NA 139 140  K 3.7 2.5*  CL 101 105  CO2 30 26  GLUCOSE 118* 103*  BUN 40* 31*  CREATININE 1.12* 0.65  CALCIUM 9.6 9.2   LFT Recent Labs    09/16/22 0612  PROT 6.5  ALBUMIN 3.2*  AST 33  ALT 43  ALKPHOS 51  BILITOT 0.6   PT/INR  No results for input(s): "LABPROT", "INR" in the last 72 hours.  Studies/Results: DG CHEST PORT 1 VIEW  Result Date: 09/16/2022 CLINICAL DATA:  Shortness of breath with hypoxia and wheezing. History of asthma. EXAM:  PORTABLE CHEST 1 VIEW COMPARISON:  Radiographs 03/29/2022 and 10/30/2014. Chest CTA 10/30/2014. FINDINGS: 1031 hours. Mild patient rotation to the right and low lung volumes. The heart size and mediastinal contours are stable. Minimal opacity medially at the right lung base, likely atelectasis. No confluent airspace disease, edema, pleural effusion or pneumothorax. The bones appear unremarkable. IMPRESSION: Low lung volumes with mild right basilar atelectasis. No evidence of pneumonia or edema. Electronically Signed   By: Carey Bullocks M.D.   On: 09/16/2022 11:27   DG Abd 1 View  Result Date: 09/16/2022 CLINICAL DATA:  Constipation, asthma, hypertension EXAM: ABDOMEN - 1 VIEW COMPARISON:  CT abdomen/pelvis and KUB 1 day prior FINDINGS: Diffuse gaseous distention of the colon is again seen, grossly similar to the prior CT. There is no definite free intraperitoneal air, within the confines of supine technique. Contrast is seen in the bladder from the prior CT. IMPRESSION: Diffuse gaseous distention of the colon is grossly similar to the prior CT. Electronically Signed   By: Lesia Hausen M.D.   On: 09/16/2022 11:24   CT ABDOMEN PELVIS W CONTRAST  Result Date: 09/15/2022 CLINICAL DATA:  Abdominal pain and swelling encounter EXAM: CT ABDOMEN AND PELVIS WITH CONTRAST TECHNIQUE: Multidetector CT imaging of the abdomen and pelvis was performed using the standard protocol following bolus administration of intravenous contrast. RADIATION DOSE REDUCTION: This exam was performed according to the departmental dose-optimization program which includes automated exposure control, adjustment of the mA and/or kV according to patient size and/or use of iterative reconstruction technique. CONTRAST:  OMNIPAQUE IOHEXOL 300 MG/ML  SOLN COMPARISON:  07/21/2022 FINDINGS: Lower chest: Patchy airspace opacities are noted in the lower lobes bilaterally likely postinflammatory in nature. These are new from prior exam.  Hepatobiliary: No focal liver abnormality is seen. Status post cholecystectomy. No biliary dilatation. Pancreas: Unremarkable. No pancreatic ductal dilatation or surrounding inflammatory changes. Spleen: Normal in size without focal abnormality. Adrenals/Urinary Tract: Adrenal glands are within normal limits. Kidneys demonstrate a normal enhancement pattern bilaterally. No renal calculi or obstructive changes are seen. Normal excretion is noted on delayed images. The bladder is well distended. Stomach/Bowel: Scattered diverticular change of the colon is noted. No diverticulitis is seen. Distension of the colon is seen with both air and fluid particularly in the sigmoid although no findings to suggest volvulus are noted. The appendix is within normal limits. No obstructive or inflammatory changes of the small bowel are seen. Stomach is within normal limits. Vascular/Lymphatic: Aortic atherosclerosis. No enlarged abdominal or pelvic lymph nodes. Reproductive: Uterus and bilateral adnexa are unremarkable. Other: No abdominal wall hernia or abnormality. No abdominopelvic ascites. Musculoskeletal: Degenerative changes of lumbar spine are noted. IMPRESSION: Patchy airspace opacities in the lower lobes bilaterally new from the prior exam and felt to be postinflammatory in nature. Diverticulosis without diverticulitis. Mild distension of the colon with air and fluid although no obstructive changes are seen. No small bowel obstruction is noted. Electronically Signed   By: Alcide Clever M.D.   On: 09/15/2022 20:04   DG Abd Portable 1 View  Result Date: 09/15/2022 CLINICAL DATA:  Constipation distension EXAM: PORTABLE ABDOMEN - 1 VIEW COMPARISON:  CT 07/21/2022 FINDINGS: Moderate air distension the colon with some air-filled small bowel. Gas down to the rectum. Mild stool in the  upper quadrants. Numerous lucent centered benign appearing soft tissue calcifications IMPRESSION: Moderate air distension of colon with some  air-filled small bowel, question ileus Electronically Signed   By: Jasmine Pang M.D.   On: 09/15/2022 17:14    Impression:   Abdominal discomfort. Abdominal distention.  Ileus, Ogilvie's most likely. Maximal colonic diameter about 7-8 cm based on my review of Xray.  Constipation possible.  Doubt mechanical obstruction. Dementia with acutely worsening mental status.  Plan:   Nursing attempts to manually disimpact patient. Dulcolax 10 mg suppositories alternating with enemas. Would not do any type of oral prep/therapy for bowel function, given both concern for significant ileus as well as altered mental status. Repeat abdominal xray tomorrow. NPO. Minimize narcotics. Hydrate. Patient with hypokalemia; this can worsen bowel function and must be replaced, goal K+ approximately 4. [facility] will follow.  LOS: 0 days   Torence Palmeri M  09/16/2022, 2:43 PM  Cell [phone] If no answer or after 5 PM call [phone]

## 2022-09-16 NOTE — Progress Notes (Signed)
Cleaned patient at 0730 for large amount of wetness (urine) and loose brown stool.  Received patient on room air.  Set-up for breakfast and she was able to feed herself with HOB 60 degrees upright.  At 1010, the patient appeared short of breath/dyspneic and oxygen saturation was in low 80's.  She was also not as alert as she was in the early morning.  MD at bedside immediately and new orders received.  At 1135  Oxygen sat is 100% on 4 liters nasal canula and patient appears more alert. Her daughter is at bedside.  Report given to Tammy in Stepdown/ICU at 1155.  Patient will be transferred for closer observation.

## 2022-09-16 NOTE — Progress Notes (Signed)
Pt incontinent of bowel and bladder, has had x2 large incontinent episodes since arriving to floor. Bladder scan completed noting 156 cc in bladder.

## 2022-09-17 ENCOUNTER — Inpatient Hospital Stay (HOSPITAL_COMMUNITY): Payer: Medicare Other

## 2022-09-17 DIAGNOSIS — K567 Ileus, unspecified: Secondary | ICD-10-CM | POA: Diagnosis not present

## 2022-09-17 LAB — GLUCOSE, CAPILLARY
Glucose-Capillary: 90 mg/dL (ref 70–99)
Glucose-Capillary: 95 mg/dL (ref 70–99)
Glucose-Capillary: 99 mg/dL (ref 70–99)
Glucose-Capillary: 88 mg/dL (ref 70–99)
Glucose-Capillary: 124 mg/dL — ABNORMAL HIGH (ref 70–99)

## 2022-09-17 LAB — COMPREHENSIVE METABOLIC PANEL
ALT: 33 U/L (ref 0–44)
AST: 25 U/L (ref 15–41)
Albumin: 3.1 g/dL — ABNORMAL LOW (ref 3.5–5.0)
Anion gap: 7 (ref 5–15)
BUN: 24 mg/dL — ABNORMAL HIGH (ref 8–23)
Calcium: 9.2 mg/dL (ref 8.9–10.3)
Chloride: 109 mmol/L (ref 98–111)
Creatinine, Ser: 0.69 mg/dL (ref 0.44–1.00)
GFR, Estimated: >60 mL/min (ref >60)
Glucose, Bld: 106 mg/dL — ABNORMAL HIGH (ref 70–99)
Sodium: 143 mmol/L (ref 135–145)
Total Bilirubin: 0.4 mg/dL (ref 0.3–1.2)

## 2022-09-17 LAB — CBC
HCT: 33.3 % — ABNORMAL LOW (ref 36.0–46.0)
Hemoglobin: 10.5 g/dL — ABNORMAL LOW (ref 12.0–15.0)
MCHC: 31.5 g/dL (ref 30.0–36.0)
MCV: 99.7 fL (ref 80.0–100.0)
Platelets: 216 10*3/uL (ref 150–400)
RBC: 3.34 MIL/uL — ABNORMAL LOW (ref 3.87–5.11)
RDW: 13.9 % (ref 11.5–15.5)
WBC: 6.7 10*3/uL (ref 4.0–10.5)
nRBC: 0 % (ref 0.0–0.2)

## 2022-09-17 LAB — MAGNESIUM: Magnesium: 2.5 mg/dL — ABNORMAL HIGH (ref 1.7–2.4)

## 2022-09-17 MED ORDER — SORBITOL 70 % SOLN
300.0000 mL | TOPICAL_OIL | Freq: Once | ORAL | Status: AC
  Administered 2022-09-17: 300 mL via RECTAL
  Filled 2022-09-17 (×2): qty 90

## 2022-09-17 MED ORDER — ACETAMINOPHEN 10 MG/ML IV SOLN
1000.0000 mg | Freq: Once | INTRAVENOUS | Status: AC
  Administered 2022-09-17: 1000 mg via INTRAVENOUS
  Filled 2022-09-17: qty 100

## 2022-09-17 MED ORDER — ACETAMINOPHEN 10 MG/ML IV SOLN: 1000.0000 mg | Freq: Three times a day (TID) | INTRAVENOUS | Status: DC

## 2022-09-17 MED ORDER — QUETIAPINE FUMARATE 25 MG PO TABS
25.0000 mg | ORAL_TABLET | Freq: Every day | ORAL | Status: DC
  Administered 2022-09-17 – 2022-09-21 (×5): 25 mg via ORAL
  Filled 2022-09-17 (×5): qty 1

## 2022-09-17 MED ORDER — BISACODYL 10 MG RE SUPP
10.0000 mg | Freq: Once | RECTAL | Status: AC
  Administered 2022-09-17: 10 mg via RECTAL
  Filled 2022-09-17: qty 1

## 2022-09-17 MED ORDER — KCL IN DEXTROSE-NACL 20-5-0.9 MEQ/L-%-% IV SOLN
INTRAVENOUS | Status: DC
  Filled 2022-09-17 (×5): qty 1000

## 2022-09-17 MED ORDER — POTASSIUM CHLORIDE 10 MEQ/100ML IV SOLN
10.0000 meq | INTRAVENOUS | Status: AC
  Administered 2022-09-17 (×4): 10 meq via INTRAVENOUS
  Filled 2022-09-17 (×4): qty 100

## 2022-09-17 MED ORDER — ACETAMINOPHEN 10 MG/ML IV SOLN
1000.0000 mg | Freq: Three times a day (TID) | INTRAVENOUS | Status: AC
  Administered 2022-09-17 – 2022-09-18 (×3): 1000 mg via INTRAVENOUS
  Filled 2022-09-17 (×3): qty 100

## 2022-09-17 NOTE — TOC Initial Note (Signed)
Transition of Care [facility]) - Initial/Assessment Note    Patient Details  Name: Leslie Farley MRN: [id number] Date of Birth: [birth year]  [facility] CM/SW Contact:    Coralyn Helling, LCSW Phone Number: 09/17/2022, 11:00 AM  Clinical Narrative:                 Patient from [facility]. TOC will follow for dc needs.   Expected Discharge Plan: Skilled Nursing Facility Barriers to Discharge: Continued Medical Work up   Patient Goals and CMS Choice Patient states their goals for this hospitalization and ongoing recovery are:: return to facility   Choice offered to / list presented to : NA      Expected Discharge Plan and Services       Living arrangements for the past 2 months: Skilled Nursing Facility                 DME Arranged: N/A DME Agency: NA       HH Arranged: NA HH Agency: NA        Prior Living Arrangements/Services Living arrangements for the past 2 months: Skilled Nursing Facility Lives with:: Facility Resident Patient language and need for interpreter reviewed:: Yes ([place]) Do you feel safe going back to the place where you live?: Yes      Need for Family Participation in Patient Care: Yes (Comment) Care giver support system in place?: Yes (comment)   Criminal Activity/Legal Involvement Pertinent to Current Situation/Hospitalization: No - Comment as needed  Activities of Daily Living Home Assistive Devices/Equipment: None ADL Screening (condition at time of admission) Patient's cognitive ability adequate to safely complete daily activities?: No Is the patient deaf or have difficulty hearing?: No Does the patient have difficulty seeing, even when wearing glasses/contacts?: No Does the patient have difficulty concentrating, remembering, or making decisions?: Yes Patient able to express need for assistance with ADLs?: No Does the patient have difficulty dressing or bathing?: Yes Independently performs ADLs?: No Communication:  Independent, Needs assistance (interpreter) Is this a change from baseline?: Pre-admission baseline Dressing (OT): Needs assistance Is this a change from baseline?: Pre-admission baseline Grooming: Needs assistance Is this a change from baseline?: Pre-admission baseline Feeding: Needs assistance Is this a change from baseline?: Pre-admission baseline Bathing: Needs assistance Is this a change from baseline?: Pre-admission baseline Toileting: Needs assistance Is this a change from baseline?: Pre-admission baseline In/Out Bed: Dependent Is this a change from baseline?: Pre-admission baseline Does the patient have difficulty walking or climbing stairs?: Yes Weakness of Legs: Both Weakness of Arms/Hands: Both  Permission Sought/Granted Permission sought to share information with : Facility Medical sales representative, Family Supports (Patient disoriented)    Share Information with NAME: [name], daughter  Permission granted to share info w AGENCY: [employer]        Emotional Assessment Appearance:: Appears stated age Attitude/Demeanor/Rapport: Unable to Assess Affect (typically observed): Unable to Assess Orientation: :  (disoriented) Alcohol / Substance Use: Not Applicable Psych Involvement: No (comment)  Admission diagnosis:  Ileus [facility]) [K56.7] Patient Active Problem List   Diagnosis Date Noted   Abdominal pain 09/16/2022   Leukocytosis 09/16/2022   AKI (acute kidney injury) (HCC) 09/16/2022   Elevated transaminase level 09/16/2022   Ileus (HCC) 09/16/2022   Stercoral colitis 03/29/2022   Fecal impaction (HCC) 03/29/2022   Cognitive impairment 03/29/2022   Chronic bronchitis (HCC) 01/15/2016   Insomnia 01/15/2016   Constipation 01/15/2016   CAD (coronary artery disease) of artery bypass graft 01/15/2016   Benign paroxysmal  positional vertigo 01/15/2016   History of pulmonary embolism 01/14/2016   Peripheral neuropathy 06/26/2015   Chronic constipation 05/10/2015   COPD  (chronic obstructive pulmonary disease) (HCC) 03/01/2015   Acute encephalopathy 11/10/2014   Right thyroid nodule 11/02/2014   Hypertension    GERD (gastroesophageal reflux disease)    Arthritis    PCP:  Pcp, No Pharmacy:   [facility] [address] ([address]), [place] - [address] [place] ([place]) [place] [address] Phone: [phone] Fax: [phone]     Social Determinants of Health (SDOH) Social History: SDOH Screenings   Food Insecurity: No Food Insecurity (09/15/2022)  Housing: Low Risk  (09/15/2022)  Transportation Needs: No Transportation Needs (09/15/2022)  Utilities: Not At Risk (09/15/2022)  Tobacco Use: Medium Risk (09/15/2022)   SDOH Interventions:     Readmission Risk Interventions    03/31/2022    1:08 PM  Readmission Risk Prevention Plan  Post Dischage Appt Complete  Medication Screening Complete  Transportation Screening Complete

## 2022-09-17 NOTE — Plan of Care (Signed)
  Problem: Nutrition: Goal: Adequate nutrition will be maintained Outcome: Not Progressing   

## 2022-09-17 NOTE — Progress Notes (Signed)
PROGRESS NOTE    Leslie Farley  NFA:[id number] DOB: 1930/04/15 DOA: 09/15/2022 PCP: Pcp, No   Brief Narrative: 89 year old French-speaking female with medical history significant for  dementia, worse over last 6 months, CAD, asthma, COPD, GERD, hypertension, chronic anemia, history of cholecystectomy admitted early February this year for Esther coral colitis, severe constipation and fecal impaction presented to the ED from nursing home for evaluation of abdominal pain and constipation.  CT abdomen and pelvis show patchy airspace opacity in the lower lobe bilaterally new from previous exam and felt to be postinflammatory in nature.  Diverticulosis without diverticulitis, mild distention of the colon with air and fluid within no obstructive changes are seen.  No a small bowel obstruction is noted.  She develops worsening BL wheezing and dyspnea, and Hypoxia, Oxygen sat drop to 80 % on RA> she was transfer to step down for closer monitoring.    Assessment & Plan:   Principal Problem:   Abdominal pain Active Problems:   COPD (chronic obstructive pulmonary disease) (HCC)   Constipation   Leukocytosis   AKI (acute kidney injury) (HCC)   Elevated transaminase level   Ileus (HCC)  1-Acute Hypoxic respiratory Failure.  Asthma/COPD -In setting on asthma vs hypoventilation from abdominal process.  -Continue with schedule duo-Nebulizer. Pulmicort.  -ABG. No hypercapnia.  -Chest x ray showed hypoventilation.  -Continue to monitor. Incentive spirometry ordered.   2-Abdominal pain, colonic ileum:  -Surgery Consulted, recommend support care, no surgical intervention.  -KUB; showed colonic distension.  -GI has been consulted. Recommend NPO, Enema.  -Continue to replete electrolytes.  -Had two large BM last night after enema.   3-Leukocytosis;  -CT abdomen pelvis negative. Patchy opacities lower lobes.   4-AKI;  -Presents with cr 1.1 -Previous baseline 0.6--0.9 -Received IV fluids.   -Distended bladder on CT. She has been urinating. Monitor -Bladder scan   5-Hypokalemia; Continue to replete IV.    Mild transaminases.  -Status postcholecystectomy.  CT abdomen showed no focal liver abnormality. -Follow trends.  Could be in the setting of dehydration  Hypertension:  -Continue to hold hydrochlorothiazide and losartan due to mild -AKI.  As needed hydralazine.  Dementia: Worse over the last 6 months per family. TSH:0.9, B:12: 771, thiamine: pending. Started on IV thiamine.  Ammonia normal.  No reversible cause identified. Suspect she has had delirium and underline worsening dementia.      Estimated body mass index is 30.23 kg/m as calculated from the following:   Height as of 07/21/16: 5\' 5"  (1.651 m).   Weight as of this encounter: 82.4 kg.   DVT prophylaxis: Lovenox Code Status: Full code Family Communication:  Disposition Plan:  Status is: Observation The patient will require care spanning > 2 midnights and should be moved to inpatient because: colonic ileus    Consultants:  Surgery GI  Procedures:    Antimicrobials:    Subjective: She continue to moan, sing.  She sounds better, no wheezing this am.  She had two large Bowel movement last night after enema.     Objective: Vitals:   09/17/22 0240 09/17/22 0300 09/17/22 0400 09/17/22 0429  BP: (!) 155/46 (!) 158/48 (!) 157/58   Pulse: 70 69 69   Resp: (!) 23 17 (!) 22   Temp:    99 F (37.2 C)  TempSrc:    Axillary  SpO2: 100% 97% 97%   Weight:        Intake/Output Summary (Last 24 hours) at 09/17/2022 0737 Last data filed at 09/16/2022 2351  Gross per 24 hour  Intake 900.93 ml  Output --  Net 900.93 ml   Filed Weights   09/15/22 2233  Weight: 82.4 kg    Examination:  General exam: Keeps eye close, in No distress.  Respiratory system: Normal Respiratory effort, No wheezing.  Cardiovascular system: S 1, S 2 RRR Gastrointestinal system: BS present, Softer, less distended.   Central nervous system: Singing, Moaning.  Extremities: No edema.     Data Reviewed: I have personally reviewed following labs and imaging studies  CBC: Recent Labs  Lab 09/15/22 1600 09/16/22 0612 09/17/22 0323  WBC 15.1* 10.1 6.7  NEUTROABS 12.3*  --   --   HGB 11.4* 10.5* 10.5*  HCT 35.9* 32.6* 33.3*  MCV 98.9 98.8 99.7  PLT 219 214 216   Basic Metabolic Panel: Recent Labs  Lab 09/15/22 1600 09/16/22 0612 09/16/22 1319 09/16/22 1923 09/17/22 0323  NA 139 140  --  142 143  K 3.7 2.5*  --  3.4* 3.2*  CL 101 105  --  107 109  CO2 30 26  --  27 27  GLUCOSE 118* 103*  --  120* 106*  BUN 40* 31*  --  25* 24*  CREATININE 1.12* 0.65  --  0.68 0.69  CALCIUM 9.6 9.2  --  9.3 9.2  MG  --   --  2.3  --  2.5*   GFR: CrCl cannot be calculated (Unknown ideal weight.). Liver Function Tests: Recent Labs  Lab 09/15/22 1600 09/16/22 0612 09/17/22 0323  AST 48* 33 25  ALT 55* 43 33  ALKPHOS 60 51 48  BILITOT 0.7 0.6 0.4  PROT 7.2 6.5 6.4*  ALBUMIN 3.5 3.2* 3.1*   Recent Labs  Lab 09/15/22 1600  LIPASE 32   Recent Labs  Lab 09/16/22 1413  AMMONIA 12   Coagulation Profile: No results for input(s): "INR", "PROTIME" in the last 168 hours. Cardiac Enzymes: No results for input(s): "CKTOTAL", "CKMB", "CKMBINDEX", "TROPONINI" in the last 168 hours. BNP (last 3 results) No results for input(s): "PROBNP" in the last 8760 hours. HbA1C: No results for input(s): "HGBA1C" in the last 72 hours. CBG: No results for input(s): "GLUCAP" in the last 168 hours. Lipid Profile: No results for input(s): "CHOL", "HDL", "LDLCALC", "TRIG", "CHOLHDL", "LDLDIRECT" in the last 72 hours. Thyroid Function Tests: Recent Labs    09/16/22 1413  TSH 0.902   Anemia Panel: Recent Labs    09/16/22 1413  VITAMINB12 771   Sepsis Labs: No results for input(s): "PROCALCITON", "LATICACIDVEN" in the last 168 hours.  Recent Results (from the past 240 hour(s))  MRSA Next Gen by PCR,  Nasal     Status: Abnormal   Collection Time: 09/16/22  3:17 AM   Specimen: Nasal Mucosa; Nasal Swab  Result Value Ref Range Status   MRSA by PCR Next Gen DETECTED (A) NOT DETECTED Final    Comment: (NOTE) The GeneXpert MRSA Assay (FDA approved for NASAL specimens only), is one component of a comprehensive MRSA colonization surveillance program. It is not intended to diagnose MRSA infection nor to guide or monitor treatment for MRSA infections. Test performance is not FDA approved in patients less than 15 years old. Performed at [facility], [address] [place], [place] [address]          Radiology Studies: DG CHEST PORT 1 VIEW  Result Date: 09/16/2022 CLINICAL DATA:  Shortness of breath with hypoxia and wheezing. History of asthma. EXAM: PORTABLE CHEST 1 VIEW  COMPARISON:  Radiographs 03/29/2022 and 10/30/2014. Chest CTA 10/30/2014. FINDINGS: 1031 hours. Mild patient rotation to the right and low lung volumes. The heart size and mediastinal contours are stable. Minimal opacity medially at the right lung base, likely atelectasis. No confluent airspace disease, edema, pleural effusion or pneumothorax. The bones appear unremarkable. IMPRESSION: Low lung volumes with mild right basilar atelectasis. No evidence of pneumonia or edema. Electronically Signed   By: Carey Bullocks M.D.   On: 09/16/2022 11:27   DG Abd 1 View  Result Date: 09/16/2022 CLINICAL DATA:  Constipation, asthma, hypertension EXAM: ABDOMEN - 1 VIEW COMPARISON:  CT abdomen/pelvis and KUB 1 day prior FINDINGS: Diffuse gaseous distention of the colon is again seen, grossly similar to the prior CT. There is no definite free intraperitoneal air, within the confines of supine technique. Contrast is seen in the bladder from the prior CT. IMPRESSION: Diffuse gaseous distention of the colon is grossly similar to the prior CT. Electronically Signed   By: Lesia Hausen M.D.   On: 09/16/2022 11:24   CT ABDOMEN  PELVIS W CONTRAST  Result Date: 09/15/2022 CLINICAL DATA:  Abdominal pain and swelling encounter EXAM: CT ABDOMEN AND PELVIS WITH CONTRAST TECHNIQUE: Multidetector CT imaging of the abdomen and pelvis was performed using the standard protocol following bolus administration of intravenous contrast. RADIATION DOSE REDUCTION: This exam was performed according to the departmental dose-optimization program which includes automated exposure control, adjustment of the mA and/or kV according to patient size and/or use of iterative reconstruction technique. CONTRAST:  OMNIPAQUE IOHEXOL 300 MG/ML  SOLN COMPARISON:  07/21/2022 FINDINGS: Lower chest: Patchy airspace opacities are noted in the lower lobes bilaterally likely postinflammatory in nature. These are new from prior exam. Hepatobiliary: No focal liver abnormality is seen. Status post cholecystectomy. No biliary dilatation. Pancreas: Unremarkable. No pancreatic ductal dilatation or surrounding inflammatory changes. Spleen: Normal in size without focal abnormality. Adrenals/Urinary Tract: Adrenal glands are within normal limits. Kidneys demonstrate a normal enhancement pattern bilaterally. No renal calculi or obstructive changes are seen. Normal excretion is noted on delayed images. The bladder is well distended. Stomach/Bowel: Scattered diverticular change of the colon is noted. No diverticulitis is seen. Distension of the colon is seen with both air and fluid particularly in the sigmoid although no findings to suggest volvulus are noted. The appendix is within normal limits. No obstructive or inflammatory changes of the small bowel are seen. Stomach is within normal limits. Vascular/Lymphatic: Aortic atherosclerosis. No enlarged abdominal or pelvic lymph nodes. Reproductive: Uterus and bilateral adnexa are unremarkable. Other: No abdominal wall hernia or abnormality. No abdominopelvic ascites. Musculoskeletal: Degenerative changes of lumbar spine are noted.  IMPRESSION: Patchy airspace opacities in the lower lobes bilaterally new from the prior exam and felt to be postinflammatory in nature. Diverticulosis without diverticulitis. Mild distension of the colon with air and fluid although no obstructive changes are seen. No small bowel obstruction is noted. Electronically Signed   By: Alcide Clever M.D.   On: 09/15/2022 20:04   DG Abd Portable 1 View  Result Date: 09/15/2022 CLINICAL DATA:  Constipation distension EXAM: PORTABLE ABDOMEN - 1 VIEW COMPARISON:  CT 07/21/2022 FINDINGS: Moderate air distension the colon with some air-filled small bowel. Gas down to the rectum. Mild stool in the upper quadrants. Numerous lucent centered benign appearing soft tissue calcifications IMPRESSION: Moderate air distension of colon with some air-filled small bowel, question ileus Electronically Signed   By: Jasmine Pang M.D.   On: 09/15/2022 17:14  Scheduled Meds:  budesonide (PULMICORT) nebulizer solution  0.25 mg Nebulization BID   Chlorhexidine Gluconate Cloth  6 each Topical Q0600   enoxaparin (LOVENOX) injection  40 mg Subcutaneous Q24H   ipratropium-albuterol  3 mL Nebulization TID   mupirocin ointment  1 Application Nasal BID   Continuous Infusions:  0.9 % NaCl with KCl 20 mEq / L 50 mL/hr at 09/16/22 1432   potassium chloride     thiamine (VITAMIN B1) injection Stopped (09/16/22 2121)     LOS: 1 day    Time spent: 35 minutes    Percilla Tweten A Anniece Bleiler, MD [facility]   If 7PM-7AM, please contact night-coverage [email or web address]  09/17/2022, 7:37 AM

## 2022-09-17 NOTE — Plan of Care (Signed)

## 2022-09-17 NOTE — Progress Notes (Signed)
Subjective: Some scant bowel function after suppositories and enema yesterday.  Objective: Vital signs in last 24 hours: Temp:  [98.2 F (36.8 C)-99 F (37.2 C)] 98.6 F (37 C) (07/25 0825) Pulse Rate:  [68-75] 73 (07/25 1000) Resp:  [14-25] 22 (07/25 1000) BP: (145-174)/(44-124) 174/56 (07/25 1000) SpO2:  [95 %-100 %] 99 % (07/25 1000) Weight change:  Last BM Date : 09/16/22  PE: GEN:  Somnolent, no acute distress, non-verbal ABD:  Distended but soft (about same as yesterday), no tenderness today NEURO:  Not alert, non-verbal  Lab Results: CBC    Component Value Date/Time   WBC 6.7 09/17/2022 0323   RBC 3.34 (L) 09/17/2022 0323   HGB 10.5 (L) 09/17/2022 0323   HCT 33.3 (L) 09/17/2022 0323   PLT 216 09/17/2022 0323   MCV 99.7 09/17/2022 0323   MCH 31.4 09/17/2022 0323   MCHC 31.5 09/17/2022 0323   RDW 13.9 09/17/2022 0323   LYMPHSABS 1.9 09/15/2022 1600   MONOABS 0.9 09/15/2022 1600   EOSABS 0.1 09/15/2022 1600   BASOSABS 0.0 09/15/2022 1600   CMP     Component Value Date/Time   NA 143 09/17/2022 0323   NA 138 06/03/2016 0000   K 3.2 (L) 09/17/2022 0323   CL 109 09/17/2022 0323   CO2 27 09/17/2022 0323   GLUCOSE 106 (H) 09/17/2022 0323   BUN 24 (H) 09/17/2022 0323   BUN 15 06/03/2016 0000   CREATININE 0.69 09/17/2022 0323   CALCIUM 9.2 09/17/2022 0323   PROT 6.4 (L) 09/17/2022 0323   ALBUMIN 3.1 (L) 09/17/2022 0323   AST 25 09/17/2022 0323   ALT 33 09/17/2022 0323   ALKPHOS 48 09/17/2022 0323   BILITOT 0.4 09/17/2022 0323   GFRNONAA >60 09/17/2022 0323   Assessment:  Abdominal discomfort. Abdominal distention.  Ileus, Ogilvie's most likely. Maximal colonic diameter about 7-8 cm based on my review of Xray.  Constipation possible.  Doubt mechanical obstruction. Hypokalemia. Dementia with acutely worsening mental status.  Plan:   Awaiting official abdominal xray report, but based on my personal review, degree of colonic distention is about the  same. Continue suppositories and enemas. Minimize narcotics. Mobilize when/if/as able. Ongoing potassium repletion is needed. Repeat abdominal xray tomorrow. If no ongoing improvement, could consider rectal tube, but I suspect some of her colonic distention is is chronic in light of prior constipation and benign abdominal exam despite colon diameter 7-8 cm, and as such not sure how aggressive we need to be in this situation to achieve "normal" colonic diameter. Not awake enough to try anything by mouth for constipation. [facility] will follow.  Leslie Farley 09/17/2022, 11:23 AM   Cell [phone] If no answer or after 5 PM call [phone]

## 2022-09-18 ENCOUNTER — Inpatient Hospital Stay (HOSPITAL_COMMUNITY): Payer: Medicare Other

## 2022-09-18 DIAGNOSIS — Z515 Encounter for palliative care: Secondary | ICD-10-CM | POA: Diagnosis not present

## 2022-09-18 DIAGNOSIS — Z7189 Other specified counseling: Secondary | ICD-10-CM | POA: Diagnosis not present

## 2022-09-18 DIAGNOSIS — R531 Weakness: Secondary | ICD-10-CM

## 2022-09-18 LAB — GLUCOSE, CAPILLARY
Glucose-Capillary: 123 mg/dL — ABNORMAL HIGH (ref 70–99)
Glucose-Capillary: 111 mg/dL — ABNORMAL HIGH (ref 70–99)
Glucose-Capillary: 103 mg/dL — ABNORMAL HIGH (ref 70–99)
Glucose-Capillary: 92 mg/dL (ref 70–99)
Glucose-Capillary: 106 mg/dL — ABNORMAL HIGH (ref 70–99)

## 2022-09-18 MED ORDER — LORAZEPAM 2 MG/ML IJ SOLN: 0.5000 mg | Freq: Three times a day (TID) | INTRAMUSCULAR | Status: DC | PRN

## 2022-09-18 MED ORDER — BUDESONIDE 0.25 MG/2ML IN SUSP
0.2500 mg | Freq: Two times a day (BID) | RESPIRATORY_TRACT | Status: DC
  Administered 2022-09-18 – 2022-09-22 (×9): 0.25 mg via RESPIRATORY_TRACT
  Filled 2022-09-18 (×9): qty 2

## 2022-09-18 MED ORDER — POTASSIUM CHLORIDE 10 MEQ/100ML IV SOLN
10.0000 meq | INTRAVENOUS | Status: AC
  Administered 2022-09-18 (×2): 10 meq via INTRAVENOUS
  Filled 2022-09-18 (×2): qty 100

## 2022-09-18 MED ORDER — POTASSIUM CHLORIDE 10 MEQ/100ML IV SOLN
10.0000 meq | INTRAVENOUS | Status: AC
  Administered 2022-09-18 (×5): 10 meq via INTRAVENOUS
  Filled 2022-09-18 (×5): qty 100

## 2022-09-18 NOTE — TOC Progression Note (Addendum)
Transition of Care [facility]) - Progression Note    Patient Details  Name: Leslie Farley MRN: [id number] Date of Birth: 04/30/1930  [facility] CM/SW Contact  Adrian Prows, RN Phone Number: 09/18/2022, 3:11 PM  Clinical Narrative:    Notified by Sheria Lang, RN pt's dtr would like for pt to be followed by [facility]; spoke w/ pt's dtrs Hilda Lias and Patterson Hammersmith; they would like for pt to be followed by [facility]; pt is LTC pt at [facility]; they are not sure if pt may need residential hospice dependent on pt's course over the weekend; Dionicio Stall, [facility] Liaison for agency notified via secure chat; also LVM at [phone].  -[id number]- notified by Watt Climes Junious at [facility] that agency will contact pt's family; TOC will follow.   Expected Discharge Plan: Skilled Nursing Facility Barriers to Discharge: Continued Medical Work up  Expected Discharge Plan and Services       Living arrangements for the past 2 months: Skilled Nursing Facility                 DME Arranged: N/A DME Agency: NA       HH Arranged: NA HH Agency: NA         Social Determinants of Health (SDOH) Interventions SDOH Screenings   Food Insecurity: No Food Insecurity (09/15/2022)  Housing: Low Risk  (09/15/2022)  Transportation Needs: No Transportation Needs (09/15/2022)  Utilities: Not At Risk (09/15/2022)  Tobacco Use: Medium Risk (09/15/2022)    Readmission Risk Interventions    03/31/2022    1:08 PM  Readmission Risk Prevention Plan  Post Dischage Appt Complete  Medication Screening Complete  Transportation Screening Complete

## 2022-09-18 NOTE — Consult Note (Signed)
Consultation Note Date: 09/18/2022   Patient Name: Leslie Farley  DOB: 15-Sep-1930  MRN: [id number]  Age / Sex: 89 y.o., female  PCP: Pcp, No Referring Physician: Alba Cory, MD  Reason for Consultation: Establishing goals of care  HPI/Patient Profile: 89 y.o. female admitted on 09/15/2022.   Clinical Assessment and Goals of Care: 89 year old lady from [facility], has been living in long-term facilities for the last 7 years, past medical history of dementia worsening over the last 6 months, coronary artery disease asthma COPD GERD hypertension chronic anemia history of cholecystectomy. Patient was admitted in for earlier in February this year for stercoral colitis and severe constipation and fecal impaction. Patient admitted with abdominal pain and distention CT abdomen and pelvis showing patchy airspace opacity bilaterally in lower lobes new from previous exam-postinflammatory in nature, diverticulosis without diverticulitis. Admitted to hospital medicine service, GI colleagues also following. Palliative consult for CODE STATUS and broad goals of care discussions has been requested Remains n.p.o. Abdominal imaging is being followed and reviewed by GI specialists and rectal tube is being considered.    NEXT OF KIN  2 daughters [name] [phone].   SUMMARY OF RECOMMENDATIONS   Goals of care discussions.  Chart reviewed, palliative consult request received.  Patient is elderly lady who is resting in bed, she opens her eyes when her name is called otherwise does not verbalize much or follow commands.  She does not appear to have nonverbal gestures of distress or discomfort.  She is currently on 4 L of supplemental oxygen via nasal cannula. Private at bedside and patient's daughter was at bedside, additionally another daughter [name] was also on the phone. Palliative  medicine is specialized medical care for people living with serious illness. It focuses on providing relief from the symptoms and stress of a serious illness. The goal is to improve quality of life for both the patient and the family. Goals of care: Broad aims of medical therapy in relation to the patient's values and preferences. Our aim is to provide medical care aimed at enabling patients to achieve the goals that matter most to them, given the circumstances of their particular medical situation and their constraints.   Discussed about patient's current condition.  Patient's family states preference for DO NOT RESUSCITATE/DO NOT INTUBATE.  They do not want artificial feeding tube.  They hope that the patient is able to achieve some degree of stabilization/recovery.  If that happens, they want palliative care to start following the patient at [facility].  Discussed frankly but compassionately about patient's current condition, her n.p.o. status, likely need for rectal tube and most recent abdominal imaging was explained to the best of my ability.  Goals wishes and values attempted to be explored.  For now, the only limit on care is DNR/DNI, goals are not comfort-focused at this point.  Introduced hospice philosophy of care and stated that after time trial of current interventions for the next 24 to 48 hours, if the patient has ongoing decline,  then we would have to talk about comfort care/residential hospice.  One of the daughters present at bedside is a Child psychotherapist at [facility] facility locally. Palliative will continue to follow along. Thank you for the consult.  Code Status/Advance Care Planning:    Symptom Management:     Palliative Prophylaxis:  Delirium Protocol  Additional Recommendations (Limitations, Scope, Preferences): Full Scope Treatment  Psycho-social/Spiritual:  Desire for further Chaplaincy support:yes Additional Recommendations: Caregiving   Support/Resources  Prognosis:  Unable to determine  Discharge Planning: To Be Determined      Primary Diagnoses: Present on Admission:  Constipation  COPD (chronic obstructive pulmonary disease) (HCC)  Ileus (HCC)   I have reviewed the medical record, interviewed the patient and family, and examined the patient. The following aspects are pertinent.  Past Medical History:  Diagnosis Date   Arthritis    Asthma    COPD (chronic obstructive pulmonary disease) (HCC)    GERD (gastroesophageal reflux disease)    Hypertension    Social History   Socioeconomic History   Marital status: Single    Spouse name: Not on file   Number of children: Not on file   Years of education: Not on file   Highest education level: Not on file  Occupational History   Not on file  Tobacco Use   Smoking status: Former    Current packs/day: 0.00    Types: Cigarettes    Quit date: 02/24/1971    Years since quitting: 51.6   Smokeless tobacco: Never  Substance and Sexual Activity   Alcohol use: No   Drug use: No   Sexual activity: Not Currently  Other Topics Concern   Not on file  Social History Narrative   Not on file   Social Determinants of Health   Financial Resource Strain: Not on file  Food Insecurity: No Food Insecurity (09/15/2022)   Hunger Vital Sign    Worried About Running Out of Food in the Last Year: Never true    Ran Out of Food in the Last Year: Never true  Transportation Needs: No Transportation Needs (09/15/2022)   PRAPARE - Administrator, Civil Service (Medical): No    Lack of Transportation (Non-Medical): No  Physical Activity: Not on file  Stress: Not on file  Social Connections: Not on file   Family History  Problem Relation Age of Onset   Cancer - Other Mother        Died of throat cancer   Cancer - Prostate Father    Diabetes Brother    Scheduled Meds:  budesonide (PULMICORT) nebulizer solution  0.25 mg Nebulization BID   Chlorhexidine  Gluconate Cloth  6 each Topical Q0600   enoxaparin (LOVENOX) injection  40 mg Subcutaneous Q24H   ipratropium-albuterol  3 mL Nebulization TID   mupirocin ointment  1 Application Nasal BID   QUEtiapine  25 mg Oral QHS   Continuous Infusions:  dextrose 5 % and 0.9 % NaCl with KCl 20 mEq/L 60 mL/hr at 09/18/22 1000   potassium chloride 10 mEq (09/18/22 1011)   PRN Meds:.acetaminophen **OR** acetaminophen, hydrALAZINE, ipratropium-albuterol, LORazepam, mouth rinse Medications Prior to Admission:  Prior to Admission medications   Medication Sig Start Date End Date Taking? Authorizing Provider  acetaminophen (TYLENOL) 500 MG tablet Take 2 tablets (1,000 mg total) by mouth every 6 (six) hours as needed for mild pain or moderate pain. 11/20/16  Yes Elson Areas, PA-C  acetaminophen (TYLENOL) 650 MG CR tablet Take 650  mg by mouth in the morning and at bedtime.   Yes [provider]  ADVAIR DISKUS 100-50 MCG/ACT AEPB Inhale 1 puff into the lungs every 12 (twelve) hours. 03/26/22  Yes [provider]  bisacodyl (DULCOLAX) 10 MG suppository Place 10 mg rectally daily as needed for moderate constipation.   Yes [provider]  Cholecalciferol (VITAMIN D) 50 MCG (2000 UT) tablet Take 2,000 Units by mouth daily.   Yes [provider]  cycloSPORINE (RESTASIS) 0.05 % ophthalmic emulsion Place 1 drop into both eyes 2 (two) times daily.   Yes [provider]  famotidine (PEPCID) 20 MG tablet Take 1 tablet (20 mg total) by mouth daily. 04/02/22  Yes Pokhrel, Laxman, MD  hydrochlorothiazide (MICROZIDE) 12.5 MG capsule Take 12.5 mg by mouth daily. 03/10/22  Yes [provider]  losartan (COZAAR) 100 MG tablet Take 100 mg by mouth daily. 03/10/22  Yes [provider]  melatonin 3 MG TABS tablet Take 6 mg by mouth at bedtime.   Yes [provider]  Multiple Vitamins-Minerals (CERTAVITE/ANTIOXIDANTS PO) Take 1 tablet by mouth daily.   Yes [provider]  polyethylene glycol (MIRALAX / GLYCOLAX) packet Take 17 g by mouth daily.   Yes [provider]  Propylene Glycol (SYSTANE BALANCE) 0.6 % SOLN Place 1 drop into both eyes in the morning and at bedtime.   Yes [provider]  Psyllium (FP FIBER LAXATIVE PO) Take 500 mg by mouth 2 (two) times daily.   Yes [provider]  senna-docusate (SENOKOT-S) 8.6-50 MG tablet Take 2 tablets by mouth at bedtime.   Yes [provider]  simethicone (MYLICON) 125 MG chewable tablet Chew 125 mg by mouth in the morning, at noon, and at bedtime.   Yes [provider]  tiZANidine (ZANAFLEX) 2 MG tablet Take 1 tablet (2 mg total) by mouth at bedtime. After completing cipro 04/06/22  Yes Pokhrel, Laxman, MD  Zinc Oxide (Z-BUM) 22 % CREA Apply 1 Application topically in the morning and at bedtime. Apply to inner thighs and perineal area twice a day.   Yes [provider]  albuterol (VENTOLIN HFA) 108 (90 Base) MCG/ACT inhaler Inhale 2 puffs into the lungs every 6 (six) hours as needed for wheezing or shortness of breath. Patient not taking: Reported on 09/17/2022 04/01/22   Joycelyn Das, MD  nitroGLYCERIN (NITROSTAT) 0.4 MG SL tablet Place 0.4 mg under the tongue every 5 (five) minutes as needed for chest pain (Do not exceed 3 doses per episode.). Patient not taking: Reported on 09/17/2022    [provider]   Allergies  Allergen Reactions   Cucumber Extract    Review of Systems No verbal  Physical Exam Elderly lady Has dementia Monitor noted Regular work of breathing Abdomen is distended  Vital Signs: BP (!) 149/49   Pulse 75   Temp 98.5 F (36.9 C) (Axillary)   Resp 12   Wt 82.4 kg   SpO2 100%   BMI 30.23 kg/m  Pain Scale: PAINAD POSS *See Group Information*: 1-Acceptable,Awake and alert Pain Score: 0-No pain   SpO2: SpO2: 100 % O2 Device:SpO2: 100 % O2 Flow Rate: .O2 Flow Rate (L/min): 3 L/min  IO: Intake/output  summary:  Intake/Output Summary (Last 24 hours) at 09/18/2022 1049 Last data filed at 09/18/2022 1000 Gross per 24 hour  Intake 2261.3 ml  Output 725 ml  Net 1536.3 ml    LBM: Last BM Date : 09/17/22 Baseline Weight: Weight: 82.4 kg  Most recent weight: Weight: 82.4 kg     Palliative Assessment/Data:   PPS 40%  Time In:  9.50 Time Out:  10.50 Time Total:  60  Greater than 50%  of this time was spent counseling and coordinating care related to the above assessment and plan.  Signed by: Rosalin Hawking, MD   Please contact Palliative Medicine Team phone at [phone] for questions and concerns.  For individual provider: See Loretha Stapler

## 2022-09-18 NOTE — Progress Notes (Signed)
PROGRESS NOTE    Leslie Farley  NWG:[id number] DOB: 08-22-1930 DOA: 09/15/2022 PCP: Pcp, No   Brief Narrative: 89 year old French-speaking female with medical history significant for  dementia, worse over last 6 months, CAD, asthma, COPD, GERD, hypertension, chronic anemia, history of cholecystectomy admitted early February this year for Esther coral colitis, severe constipation and fecal impaction presented to the ED from nursing home for evaluation of abdominal pain and constipation.  CT abdomen and pelvis show patchy airspace opacity in the lower lobe bilaterally new from previous exam and felt to be postinflammatory in nature.  Diverticulosis without diverticulitis, mild distention of the colon with air and fluid within no obstructive changes are seen.  No a small bowel obstruction is noted.  She develops worsening BL wheezing and dyspnea, and Hypoxia, Oxygen sat drop to 80 % on RA> she was transfer to step down for closer monitoring.    Assessment & Plan:   Principal Problem:   Abdominal pain Active Problems:   COPD (chronic obstructive pulmonary disease) (HCC)   Constipation   Leukocytosis   AKI (acute kidney injury) (HCC)   Elevated transaminase level   Ileus (HCC)  1-Acute Hypoxic respiratory Failure.  Asthma/COPD -In setting on asthma vs hypoventilation from abdominal process.  -Continue with schedule duo-Nebulizer. Pulmicort.  -ABG. No hypercapnia.  -Chest x ray showed hypoventilation.  -Continue to monitor. Incentive spirometry ordered.   2-Abdominal pain, colonic ileum:  -Surgery Consulted, recommend support care, no surgical intervention.  -KUB; showed colonic distension.  -GI has been consulted. Resume diet when ok by GI -Continue to replete electrolytes.  -per nurse patient has had multiples BM after enema yesterday.   3-Leukocytosis;  -CT abdomen pelvis negative. Patchy opacities lower lobes.  -Resolved.   4-AKI;  -Presents with cr 1.1 -Previous  baseline 0.6--0.9 -Received IV fluids.  -Distended bladder on CT. She has been urinating. Monitor -Bladder scan   5-Hypokalemia; Continue to replete IV 5 runs and KCL on IV fluids.    Mild transaminases.  -Status postcholecystectomy.  CT abdomen showed no focal liver abnormality. -Follow trends.  Could be in the setting of dehydration  Hypertension:  -Continue to hold hydrochlorothiazide and losartan due to mild -AKI.  As needed hydralazine.  Dementia: Worse over the last 6 months per family. Acute metabolic encephalopathy, delirium.  TSH:0.9, B:12: 771, thiamine: pending. Started on IV thiamine.  Ammonia normal.  No reversible cause identified. Suspect she has had delirium and underline worsening dementia.      Estimated body mass index is 30.23 kg/m as calculated from the following:   Height as of 07/21/16: 5\' 5"  (1.651 m).   Weight as of this encounter: 82.4 kg.   DVT prophylaxis: Lovenox Code Status: Full code Family Communication:  Disposition Plan:  Status is: Observation The patient will require care spanning > 2 midnights and should be moved to inpatient because: colonic ileus    Consultants:  Surgery GI  Procedures:    Antimicrobials:    Subjective: Keep eyes close, abdomen less distended. Moan.    Objective: Vitals:   09/18/22 1000 09/18/22 1100 09/18/22 1144 09/18/22 1200  BP: (!) 162/66 (!) 162/65  (!) 153/79  Pulse: 78 82  78  Resp: 16 19  (!) 26  Temp:   98.4 F (36.9 C)   TempSrc:   Axillary   SpO2: 100% 100%  100%  Weight:        Intake/Output Summary (Last 24 hours) at 09/18/2022 1305 Last data filed at 09/18/2022 1249 Gross  per 24 hour  Intake 2292.72 ml  Output 725 ml  Net 1567.72 ml   Filed Weights   09/15/22 2233  Weight: 82.4 kg    Examination:  General exam: keep eyes close.  Respiratory system: No wheezing Cardiovascular system: S 1, S 2 RRR Gastrointestinal system: BS decreased, soft, distended.  Central nervous  system: Singing, Moaning.  Extremities: no edema    Data Reviewed: I have personally reviewed following labs and imaging studies  CBC: Recent Labs  Lab 09/15/22 1600 09/16/22 0612 09/17/22 0323 09/18/22 0329  WBC 15.1* 10.1 6.7 5.4  NEUTROABS 12.3*  --   --   --   HGB 11.4* 10.5* 10.5* 9.6*  HCT 35.9* 32.6* 33.3* 30.2*  MCV 98.9 98.8 99.7 100.7*  PLT 219 214 216 205   Basic Metabolic Panel: Recent Labs  Lab 09/15/22 1600 09/16/22 0612 09/16/22 1319 09/16/22 1923 09/17/22 0323 09/18/22 0329  NA 139 140  --  142 143 143  K 3.7 2.5*  --  3.4* 3.2* 3.2*  CL 101 105  --  107 109 111  CO2 30 26  --  27 27 25   GLUCOSE 118* 103*  --  120* 106* 123*  BUN 40* 31*  --  25* 24* 18  CREATININE 1.12* 0.65  --  0.68 0.69 0.65  CALCIUM 9.6 9.2  --  9.3 9.2 8.7*  MG  --   --  2.3  --  2.5*  --    GFR: CrCl cannot be calculated (Unknown ideal weight.). Liver Function Tests: Recent Labs  Lab 09/15/22 1600 09/16/22 0612 09/17/22 0323  AST 48* 33 25  ALT 55* 43 33  ALKPHOS 60 51 48  BILITOT 0.7 0.6 0.4  PROT 7.2 6.5 6.4*  ALBUMIN 3.5 3.2* 3.1*   Recent Labs  Lab 09/15/22 1600  LIPASE 32   Recent Labs  Lab 09/16/22 1413  AMMONIA 12   Coagulation Profile: No results for input(s): "INR", "PROTIME" in the last 168 hours. Cardiac Enzymes: No results for input(s): "CKTOTAL", "CKMB", "CKMBINDEX", "TROPONINI" in the last 168 hours. BNP (last 3 results) No results for input(s): "PROBNP" in the last 8760 hours. HbA1C: No results for input(s): "HGBA1C" in the last 72 hours. CBG: Recent Labs  Lab 09/17/22 1942 09/17/22 2326 09/18/22 0327 09/18/22 0737 09/18/22 1138  GLUCAP 88 124* 123* 111* 103*   Lipid Profile: No results for input(s): "CHOL", "HDL", "LDLCALC", "TRIG", "CHOLHDL", "LDLDIRECT" in the last 72 hours. Thyroid Function Tests: Recent Labs    09/16/22 1413  TSH 0.902   Anemia Panel: Recent Labs    09/16/22 1413  VITAMINB12 771   Sepsis  Labs: No results for input(s): "PROCALCITON", "LATICACIDVEN" in the last 168 hours.  Recent Results (from the past 240 hour(s))  MRSA Next Gen by PCR, Nasal     Status: Abnormal   Collection Time: 09/16/22  3:17 AM   Specimen: Nasal Mucosa; Nasal Swab  Result Value Ref Range Status   MRSA by PCR Next Gen DETECTED (A) NOT DETECTED Final    Comment: (NOTE) The GeneXpert MRSA Assay (FDA approved for NASAL specimens only), is one component of a comprehensive MRSA colonization surveillance program. It is not intended to diagnose MRSA infection nor to guide or monitor treatment for MRSA infections. Test performance is not FDA approved in patients less than 11 years old. Performed at [facility], [address], [place], [place] [address]          Radiology Studies:  DG Abd 2 Views  Result Date: 09/18/2022 CLINICAL DATA:  Abdominal distension. EXAM: ABDOMEN - 2 VIEW COMPARISON:  Radiographs 09/17/2022, 09/16/2022 and 09/15/2022. CT 09/15/2022. FINDINGS: Diffuse gaseous distention of the colon is again noted, slightly improved from the CT of 3 days ago. No significant changes seen from yesterday's radiographs. No bowel wall thickening, pneumatosis or pneumoperitoneum demonstrated. Possible small left pleural effusion with mild left basilar atelectasis. Multilevel thoracolumbar spondylosis noted. IMPRESSION: Persistent diffuse gaseous distention of the colon, slightly improved from yesterday's radiographs, most consistent with colonic ileus based on prior CT. Electronically Signed   By: Carey Bullocks M.D.   On: 09/18/2022 12:33   DG Abd 2 Views  Result Date: 09/17/2022 CLINICAL DATA:  Abdominal distention EXAM: ABDOMEN - 2 VIEW COMPARISON:  Previous studies including the examination of 09/16/2022 FINDINGS: There is distended loop of colon in mid abdomen, possibly dilated sigmoid. There is interval decrease in amount of gas in rest of the colon. There is no significant small  bowel dilation. Stomach is not distended. IMPRESSION: There is gaseous distention of sigmoid colon suggesting ileus or distal colonic obstruction. There is interval decrease in amount of gas in rest of the colon. There is no small bowel dilation. Electronically Signed   By: Ernie Avena M.D.   On: 09/17/2022 12:24        Scheduled Meds:  budesonide (PULMICORT) nebulizer solution  0.25 mg Nebulization BID   Chlorhexidine Gluconate Cloth  6 each Topical Q0600   enoxaparin (LOVENOX) injection  40 mg Subcutaneous Q24H   ipratropium-albuterol  3 mL Nebulization TID   mupirocin ointment  1 Application Nasal BID   QUEtiapine  25 mg Oral QHS   Continuous Infusions:  dextrose 5 % and 0.9 % NaCl with KCl 20 mEq/L 60 mL/hr at 09/18/22 1249   potassium chloride 100 mL/hr at 09/18/22 1249     LOS: 2 days    Time spent: 35 minutes    Ivan Maskell A Soledad Budreau, MD [facility]   If 7PM-7AM, please contact night-coverage [email or web address]  09/18/2022, 1:05 PM

## 2022-09-18 NOTE — Progress Notes (Signed)
Civil engineer, contracting [facility])  TOC/Katrice contacted [facility] reporting that daughter Azera wanted to speak with [facility] staff to discuss hospice options. Daughter contacted but no response. VM left requesting call be returned if services needed.   Patient is not an active patient with [facility] services.      Please call with any questions/concerns.    Thank you for the opportunity to participate in this patient's care.   Eugenie Birks, MSW [facility] Day Surgery Liaison  [phone]

## 2022-09-18 NOTE — Plan of Care (Signed)
  Problem: Education: Goal: Knowledge of General Education information will improve Description: Including pain rating scale, medication(s)/side effects and non-pharmacologic comfort measures Outcome: Not Progressing   Problem: Health Behavior/Discharge Planning: Goal: Ability to manage health-related needs will improve Outcome: Not Progressing   

## 2022-09-18 NOTE — Plan of Care (Signed)
Plan of care and goals reviewed with patient, no evidence of learning noted, patient handbook/guide at bedside,  Problem: Education: Goal: Knowledge of General Education information will improve Description: Including pain rating scale, medication(s)/side effects and non-pharmacologic comfort measures Outcome: Progressing   Problem: Health Behavior/Discharge Planning: Goal: Ability to manage health-related needs will improve Outcome: Progressing   Problem: Clinical Measurements: Goal: Ability to maintain clinical measurements within normal limits will improve Outcome: Progressing Goal: Will remain free from infection Outcome: Progressing Goal: Diagnostic test results will improve Outcome: Progressing Goal: Respiratory complications will improve Outcome: Progressing Goal: Cardiovascular complication will be avoided Outcome: Progressing   Problem: Activity: Goal: Risk for activity intolerance will decrease Outcome: Progressing   Problem: Nutrition: Goal: Adequate nutrition will be maintained Outcome: Progressing   Problem: Coping: Goal: Level of anxiety will decrease Outcome: Progressing   Problem: Elimination: Goal: Will not experience complications related to bowel motility Outcome: Progressing Goal: Will not experience complications related to urinary retention Outcome: Progressing   Problem: Pain Managment: Goal: General experience of comfort will improve Outcome: Progressing   Problem: Safety: Goal: Ability to remain free from injury will improve Outcome: Progressing   Problem: Skin Integrity: Goal: Risk for impaired skin integrity will decrease Outcome: Progressing

## 2022-09-18 NOTE — Progress Notes (Signed)
Subjective: Minimal responsiveness  Objective: Vital signs in last 24 hours: Temp:  [98.3 F (36.8 C)-98.9 F (37.2 C)] 98.5 F (36.9 C) (07/26 0740) Pulse Rate:  [64-82] 74 (07/26 0800) Resp:  [12-23] 16 (07/26 0800) BP: (130-175)/(41-157) 175/157 (07/26 0800) SpO2:  [93 %-100 %] 99 % (07/26 0800) Weight change:  Last BM Date : 09/17/22  PE: GEN:  Resting, not interactive NEURO: Not awake or communicative ABD: Mild/moderate distention, no peritonitis/rigidity  Lab Results: CBC    Component Value Date/Time   WBC 5.4 09/18/2022 0329   RBC 3.00 (L) 09/18/2022 0329   HGB 9.6 (L) 09/18/2022 0329   HCT 30.2 (L) 09/18/2022 0329   PLT 205 09/18/2022 0329   MCV 100.7 (H) 09/18/2022 0329   MCH 32.0 09/18/2022 0329   MCHC 31.8 09/18/2022 0329   RDW 14.2 09/18/2022 0329   LYMPHSABS 1.9 09/15/2022 1600   MONOABS 0.9 09/15/2022 1600   EOSABS 0.1 09/15/2022 1600   BASOSABS 0.0 09/15/2022 1600  CMP     Component Value Date/Time   NA 143 09/18/2022 0329   NA 138 06/03/2016 0000   K 3.2 (L) 09/18/2022 0329   CL 111 09/18/2022 0329   CO2 25 09/18/2022 0329   GLUCOSE 123 (H) 09/18/2022 0329   BUN 18 09/18/2022 0329   BUN 15 06/03/2016 0000   CREATININE 0.65 09/18/2022 0329   CALCIUM 8.7 (L) 09/18/2022 0329   PROT 6.4 (L) 09/17/2022 0323   ALBUMIN 3.1 (L) 09/17/2022 0323   AST 25 09/17/2022 0323   ALT 33 09/17/2022 0323   ALKPHOS 48 09/17/2022 0323   BILITOT 0.4 09/17/2022 0323   GFRNONAA >60 09/18/2022 0329   Assessment:  Abdominal discomfort. Abdominal distention.  Ileus, Ogilvie's most likely. Constipation possible.  Doubt mechanical obstruction based on admission CT and clinical course. Hypokalemia. Dementia with acutely worsening mental status.  Plan:   More aggressive replete potassium; she is still significant hypokalemic despite repletion. Mobilize as/if/when tolerated. Minimize narcotics. I suspect some of her colonic dilatation is more chronic.  I would  not pursue oral therapies (oral miralax, etc) or more invasive studies (rectal tube) unless patient wakes up more and/or has worsening abdominal exam, respectively. [facility] will follow.   Freddy Jaksch 09/18/2022, 8:47 AM   Cell [phone] If no answer or after 5 PM call [phone]

## 2022-09-18 NOTE — Plan of Care (Signed)
  Problem: Education: Goal: Knowledge of General Education information will improve Description: Including pain rating scale, medication(s)/side effects and non-pharmacologic comfort measures Outcome: Not Progressing   Advanced dementia

## 2022-09-19 DIAGNOSIS — K567 Ileus, unspecified: Secondary | ICD-10-CM | POA: Diagnosis not present

## 2022-09-19 LAB — GLUCOSE, CAPILLARY
Glucose-Capillary: 101 mg/dL — ABNORMAL HIGH (ref 70–99)
Glucose-Capillary: 103 mg/dL — ABNORMAL HIGH (ref 70–99)
Glucose-Capillary: 110 mg/dL — ABNORMAL HIGH (ref 70–99)
Glucose-Capillary: 120 mg/dL — ABNORMAL HIGH (ref 70–99)
Glucose-Capillary: 93 mg/dL (ref 70–99)
Glucose-Capillary: 94 mg/dL (ref 70–99)
Glucose-Capillary: 97 mg/dL (ref 70–99)

## 2022-09-19 MED ORDER — ACETAMINOPHEN 10 MG/ML IV SOLN
1000.0000 mg | Freq: Two times a day (BID) | INTRAVENOUS | Status: DC
  Filled 2022-09-19: qty 100

## 2022-09-19 MED ORDER — FLEET ENEMA 7-19 GM/118ML RE ENEM
1.0000 | ENEMA | Freq: Once | RECTAL | Status: AC
  Administered 2022-09-20: 1 via RECTAL
  Filled 2022-09-19: qty 1

## 2022-09-19 MED ORDER — POTASSIUM CHLORIDE 10 MEQ/100ML IV SOLN
10.0000 meq | INTRAVENOUS | Status: AC
  Administered 2022-09-19 (×2): 10 meq via INTRAVENOUS
  Filled 2022-09-19 (×2): qty 100

## 2022-09-19 MED ORDER — ACETAMINOPHEN 325 MG PO TABS
650.0000 mg | ORAL_TABLET | Freq: Three times a day (TID) | ORAL | Status: DC
  Administered 2022-09-19 (×2): 650 mg via ORAL
  Filled 2022-09-19 (×2): qty 2

## 2022-09-19 NOTE — Progress Notes (Signed)
[facility] Gastroenterology Progress Note  Leslie Farley 89 y.o. 11-02-1930  CC: Colonic ileus, constipation   Subjective: Patient seen and examined at bedside.  Not able to obtain any history from the patient.  Discussed with RN at bedside.  Patient is able to take medicine with sips of water.  Last bowel movement 2 days ago.  ROS : Not able to obtain   Objective: Vital signs in last 24 hours: Vitals:   09/19/22 0800 09/19/22 0818  BP: (!) 145/56   Pulse: 71   Resp: (!) 9   Temp:  98.2 F (36.8 C)  SpO2: 100%     Physical Exam: Resting comfortably, sleepy, not able to obtain any history.  Abdomen is minimally distended, bowel sounds hypoactive, no peritoneal signs.  Lab Results: Recent Labs    09/17/22 0323 09/18/22 0329 09/19/22 0317  NA 143 143 143  K 3.2* 3.2* 3.8  CL 109 111 114*  CO2 27 25 24   GLUCOSE 106* 123* 112*  BUN 24* 18 17  CREATININE 0.69 0.65 0.64  CALCIUM 9.2 8.7* 8.5*  MG 2.5*  --  2.3   Recent Labs    09/17/22 0323  AST 25  ALT 33  ALKPHOS 48  BILITOT 0.4  PROT 6.4*  ALBUMIN 3.1*   Recent Labs    09/17/22 0323 09/18/22 0329  WBC 6.7 5.4  HGB 10.5* 9.6*  HCT 33.3* 30.2*  MCV 99.7 100.7*  PLT 216 205   No results for input(s): "LABPROT", "INR" in the last 72 hours.    Assessment/Plan: -Colonic ileus/colonic pseudoobstruction.  Abdominal exam showed mildly distended abdomen.  Last bowel movement 2 days ago.  Not able to obtain any history from the patient. -Worsening dementia.  Not able to obtain any history.  Recommendations ------------------------- -Maintain electrolytes.  Minimize narcotics.  Ambulate with assistance if possible. -1 Fleet enema today.  Recommend to try MiraLAX once or twice a day once able to tolerate oral intake/at least clear liquids. -GI will follow-up on Monday. -Discussed with RN at bedside.   Kathi Der MD, FACP 09/19/2022, 10:21 AM  Contact #  [phone]

## 2022-09-19 NOTE — Progress Notes (Signed)
  Daily Progress Note   Patient Name: Leslie Farley       Date: 09/19/2022 DOB: 11-28-1930  Age: 89 y.o. MRN#: [id number] Attending Physician: Alba Cory, MD Primary Care Physician: Pcp, No Admit Date: 09/15/2022 Length of Stay: 3 days  Reviewed EMR and discussed care with RN today. Planning for patient to receive another enema today to help with colonic ileus/pseudo obstruction management. Starting clear diet. Allowing time for outcomes. Palliative medicine team will continue to follow along with patient's medical journey.Thank you.    Alvester Morin, DO Palliative Care Provider PMT # [phone]

## 2022-09-19 NOTE — Progress Notes (Signed)
PROGRESS NOTE    Leslie Farley  XBJ:[id number] DOB: [birth year] DOA: 09/15/2022 PCP: Pcp, No   Brief Narrative: 89 year old French-speaking female with medical history significant for  dementia, worse over last 6 months, CAD, asthma, COPD, GERD, hypertension, chronic anemia, history of cholecystectomy admitted early February this year for Esther coral colitis, severe constipation and fecal impaction presented to the ED from nursing home for evaluation of abdominal pain and constipation.  CT abdomen and pelvis show patchy airspace opacity in the lower lobe bilaterally new from previous exam and felt to be postinflammatory in nature.  Diverticulosis without diverticulitis, mild distention of the colon with air and fluid within no obstructive changes are seen.  No a small bowel obstruction is noted.  She develops worsening BL wheezing and dyspnea, and Hypoxia, Oxygen sat drop to 80 % on RA> she was transfer to step down for closer monitoring.    Assessment & Plan:   Principal Problem:   Abdominal pain Active Problems:   COPD (chronic obstructive pulmonary disease) (HCC)   Constipation   Leukocytosis   AKI (acute kidney injury) (HCC)   Elevated transaminase level   Ileus (HCC)  1-Acute Hypoxic respiratory Failure.  Asthma/COPD -In setting on asthma vs hypoventilation from abdominal process.  -Continue with schedule duo-Nebulizer. Pulmicort.  -ABG. No hypercapnia.  -Chest x ray showed hypoventilation.  -Continue to monitor. Incentive spirometry ordered.  Stable on 2 L oxygen.   2-Abdominal pain, colonic ileum:  -Surgery Consulted, recommend support care, no surgical intervention.  -KUB; showed colonic distension.  -GI has been consulted. Discussed with GI will start clear.  -Continue to replete electrolytes.  -Last BM 7/25. -Plan to repeat Enema today.   3-Leukocytosis;  -CT abdomen pelvis negative. Patchy opacities lower lobes.  -Resolved.   4-AKI;  -Presents with cr  1.1 -Previous baseline 0.6--0.9 -Received IV fluids.  -Distended bladder on CT.  -Foley catheter placed.   5-Hypokalemia; Replaced.   Mild transaminases.  -Status postcholecystectomy.  CT abdomen showed no focal liver abnormality. -Follow trends.  Could be in the setting of dehydration  Hypertension:  -Continue to hold hydrochlorothiazide and losartan due to mild -AKI.  As needed hydralazine.  Dementia: Worse over the last 6 months per family. Acute metabolic encephalopathy, delirium.  TSH:0.9, B:12: 771, thiamine: pending. Started on IV thiamine.  Ammonia normal.  No reversible cause identified. Suspect she has had delirium and underline worsening dementia.      Estimated body mass index is 30.23 kg/m as calculated from the following:   Height as of 07/21/16: 5\' 5"  (1.651 m).   Weight as of this encounter: 82.4 kg.   DVT prophylaxis: Lovenox Code Status: Full code Family Communication: Daughter 7/26 Disposition Plan:  Status is: Observation The patient will require care spanning > 2 midnights and should be moved to inpatient because: colonic ileus    Consultants:  Surgery GI  Procedures:    Antimicrobials:    Subjective: She is alert this afternoon, eyes open. Say few words.  No BM  Objective: Vitals:   09/19/22 0200 09/19/22 0300 09/19/22 0400 09/19/22 0600  BP: (!) 119/44  (!) 130/45 (!) 141/52  Pulse: 73 72 74 69  Resp: 13 16 16  (!) 28  Temp:  97.6 F (36.4 C)    TempSrc:  Oral    SpO2: 100% 100% 100% 100%  Weight:        Intake/Output Summary (Last 24 hours) at 09/19/2022 0714 Last data filed at 09/19/2022 0650 Gross per 24 hour  Intake 2346.56 ml  Output 350 ml  Net 1996.56 ml   Filed Weights   09/15/22 2233  Weight: 82.4 kg    Examination:  General exam: Alert Respiratory system: BL air movement.  Cardiovascular system: S 1, S 2 RRR Gastrointestinal system: BS present, soft, nt Central nervous system: alert.  Extremities: no  edema    Data Reviewed: I have personally reviewed following labs and imaging studies  CBC: Recent Labs  Lab 09/15/22 1600 09/16/22 0612 09/17/22 0323 09/18/22 0329  WBC 15.1* 10.1 6.7 5.4  NEUTROABS 12.3*  --   --   --   HGB 11.4* 10.5* 10.5* 9.6*  HCT 35.9* 32.6* 33.3* 30.2*  MCV 98.9 98.8 99.7 100.7*  PLT 219 214 216 205   Basic Metabolic Panel: Recent Labs  Lab 09/16/22 0612 09/16/22 1319 09/16/22 1923 09/17/22 0323 09/18/22 0329 09/19/22 0317  NA 140  --  142 143 143 143  K 2.5*  --  3.4* 3.2* 3.2* 3.8  CL 105  --  107 109 111 114*  CO2 26  --  27 27 25 24   GLUCOSE 103*  --  120* 106* 123* 112*  BUN 31*  --  25* 24* 18 17  CREATININE 0.65  --  0.68 0.69 0.65 0.64  CALCIUM 9.2  --  9.3 9.2 8.7* 8.5*  MG  --  2.3  --  2.5*  --  2.3   GFR: CrCl cannot be calculated (Unknown ideal weight.). Liver Function Tests: Recent Labs  Lab 09/15/22 1600 09/16/22 0612 09/17/22 0323  AST 48* 33 25  ALT 55* 43 33  ALKPHOS 60 51 48  BILITOT 0.7 0.6 0.4  PROT 7.2 6.5 6.4*  ALBUMIN 3.5 3.2* 3.1*   Recent Labs  Lab 09/15/22 1600  LIPASE 32   Recent Labs  Lab 09/16/22 1413  AMMONIA 12   Coagulation Profile: No results for input(s): "INR", "PROTIME" in the last 168 hours. Cardiac Enzymes: No results for input(s): "CKTOTAL", "CKMB", "CKMBINDEX", "TROPONINI" in the last 168 hours. BNP (last 3 results) No results for input(s): "PROBNP" in the last 8760 hours. HbA1C: No results for input(s): "HGBA1C" in the last 72 hours. CBG: Recent Labs  Lab 09/18/22 1138 09/18/22 1539 09/18/22 1939 09/19/22 0010 09/19/22 0318  GLUCAP 103* 92 106* 94 103*   Lipid Profile: No results for input(s): "CHOL", "HDL", "LDLCALC", "TRIG", "CHOLHDL", "LDLDIRECT" in the last 72 hours. Thyroid Function Tests: Recent Labs    09/16/22 1413  TSH 0.902   Anemia Panel: Recent Labs    09/16/22 1413  VITAMINB12 771   Sepsis Labs: No results for input(s): "PROCALCITON",  "LATICACIDVEN" in the last 168 hours.  Recent Results (from the past 240 hour(s))  MRSA Next Gen by PCR, Nasal     Status: Abnormal   Collection Time: 09/16/22  3:17 AM   Specimen: Nasal Mucosa; Nasal Swab  Result Value Ref Range Status   MRSA by PCR Next Gen DETECTED (A) NOT DETECTED Final    Comment: (NOTE) The GeneXpert MRSA Assay (FDA approved for NASAL specimens only), is one component of a comprehensive MRSA colonization surveillance program. It is not intended to diagnose MRSA infection nor to guide or monitor treatment for MRSA infections. Test performance is not FDA approved in patients less than 40 years old. Performed at [facility], [address], [place], [place] [address]          Radiology Studies: DG Abd 2 Views  Result Date: 09/18/2022 CLINICAL  DATA:  Abdominal distension. EXAM: ABDOMEN - 2 VIEW COMPARISON:  Radiographs 09/17/2022, 09/16/2022 and 09/15/2022. CT 09/15/2022. FINDINGS: Diffuse gaseous distention of the colon is again noted, slightly improved from the CT of 3 days ago. No significant changes seen from yesterday's radiographs. No bowel wall thickening, pneumatosis or pneumoperitoneum demonstrated. Possible small left pleural effusion with mild left basilar atelectasis. Multilevel thoracolumbar spondylosis noted. IMPRESSION: Persistent diffuse gaseous distention of the colon, slightly improved from yesterday's radiographs, most consistent with colonic ileus based on prior CT. Electronically Signed   By: Carey Bullocks M.D.   On: 09/18/2022 12:33   DG Abd 2 Views  Result Date: 09/17/2022 CLINICAL DATA:  Abdominal distention EXAM: ABDOMEN - 2 VIEW COMPARISON:  Previous studies including the examination of 09/16/2022 FINDINGS: There is distended loop of colon in mid abdomen, possibly dilated sigmoid. There is interval decrease in amount of gas in rest of the colon. There is no significant small bowel dilation. Stomach is not distended.  IMPRESSION: There is gaseous distention of sigmoid colon suggesting ileus or distal colonic obstruction. There is interval decrease in amount of gas in rest of the colon. There is no small bowel dilation. Electronically Signed   By: Ernie Avena M.D.   On: 09/17/2022 12:24        Scheduled Meds:  budesonide (PULMICORT) nebulizer solution  0.25 mg Nebulization BID   Chlorhexidine Gluconate Cloth  6 each Topical Q0600   enoxaparin (LOVENOX) injection  40 mg Subcutaneous Q24H   ipratropium-albuterol  3 mL Nebulization TID   mupirocin ointment  1 Application Nasal BID   QUEtiapine  25 mg Oral QHS   Continuous Infusions:  dextrose 5 % and 0.9 % NaCl with KCl 20 mEq/L 60 mL/hr at 09/19/22 0650     LOS: 3 days    Time spent: 35 minutes    Leani Myron A Elizah Lydon, MD [facility]   If 7PM-7AM, please contact night-coverage [email or web address]  09/19/2022, 7:14 AM

## 2022-09-20 DIAGNOSIS — Z7189 Other specified counseling: Secondary | ICD-10-CM | POA: Diagnosis not present

## 2022-09-20 DIAGNOSIS — K567 Ileus, unspecified: Secondary | ICD-10-CM | POA: Diagnosis not present

## 2022-09-20 DIAGNOSIS — Z515 Encounter for palliative care: Secondary | ICD-10-CM | POA: Diagnosis not present

## 2022-09-20 DIAGNOSIS — K5901 Slow transit constipation: Secondary | ICD-10-CM | POA: Diagnosis not present

## 2022-09-20 LAB — BASIC METABOLIC PANEL WITH GFR
Anion gap: 8 (ref 5–15)
BUN: 14 mg/dL (ref 8–23)
CO2: 21 mmol/L — ABNORMAL LOW (ref 22–32)
Calcium: 8.8 mg/dL — ABNORMAL LOW (ref 8.9–10.3)
Chloride: 114 mmol/L — ABNORMAL HIGH (ref 98–111)
Creatinine, Ser: 0.7 mg/dL (ref 0.44–1.00)
GFR, Estimated: >60 mL/min (ref >60)
Glucose, Bld: 112 mg/dL — ABNORMAL HIGH (ref 70–99)
Potassium: 4.1 mmol/L (ref 3.5–5.1)
Sodium: 143 mmol/L (ref 135–145)

## 2022-09-20 LAB — CBC
HCT: 31.6 % — ABNORMAL LOW (ref 36.0–46.0)
Hemoglobin: 10.1 g/dL — ABNORMAL LOW (ref 12.0–15.0)
MCH: 31.9 pg (ref 26.0–34.0)
MCHC: 32 g/dL (ref 30.0–36.0)
MCV: 99.7 fL (ref 80.0–100.0)
Platelets: 237 10*3/uL (ref 150–400)
RBC: 3.17 MIL/uL — ABNORMAL LOW (ref 3.87–5.11)
RDW: 14.7 % (ref 11.5–15.5)
WBC: 5.9 10*3/uL (ref 4.0–10.5)
nRBC: 0 % (ref 0.0–0.2)

## 2022-09-20 LAB — GLUCOSE, CAPILLARY
Glucose-Capillary: 98 mg/dL (ref 70–99)
Glucose-Capillary: 108 mg/dL — ABNORMAL HIGH (ref 70–99)
Glucose-Capillary: 99 mg/dL (ref 70–99)

## 2022-09-20 LAB — MAGNESIUM: Magnesium: 2.4 mg/dL (ref 1.7–2.4)

## 2022-09-20 MED ORDER — DEXTROSE IN LACTATED RINGERS 5 % IV SOLN: INTRAVENOUS | Status: DC

## 2022-09-20 MED ORDER — ACETAMINOPHEN 10 MG/ML IV SOLN
1000.0000 mg | Freq: Three times a day (TID) | INTRAVENOUS | Status: AC
  Administered 2022-09-20 (×2): 1000 mg via INTRAVENOUS
  Filled 2022-09-20: qty 100

## 2022-09-20 MED ORDER — IPRATROPIUM-ALBUTEROL 0.5-2.5 (3) MG/3ML IN SOLN
3.0000 mL | Freq: Two times a day (BID) | RESPIRATORY_TRACT | Status: DC
  Administered 2022-09-20 – 2022-09-22 (×4): 3 mL via RESPIRATORY_TRACT
  Filled 2022-09-20 (×4): qty 3

## 2022-09-20 MED ORDER — AMLODIPINE BESYLATE 5 MG PO TABS
5.0000 mg | ORAL_TABLET | Freq: Every day | ORAL | Status: DC
  Administered 2022-09-20 – 2022-09-22 (×3): 5 mg via ORAL
  Filled 2022-09-20 (×3): qty 1

## 2022-09-20 NOTE — Progress Notes (Signed)
PROGRESS NOTE    Leslie Farley  ZOX:[id number] DOB: May 06, 1930 DOA: 09/15/2022 PCP: Pcp, No   Brief Narrative: 89 year old French-speaking female with medical history significant for  dementia, worse over last 6 months, CAD, asthma, COPD, GERD, hypertension, chronic anemia, history of cholecystectomy admitted early February this year for Esther coral colitis, severe constipation and fecal impaction presented to the ED from nursing home for evaluation of abdominal pain and constipation.  CT abdomen and pelvis show patchy airspace opacity in the lower lobe bilaterally new from previous exam and felt to be postinflammatory in nature.  Diverticulosis without diverticulitis, mild distention of the colon with air and fluid within no obstructive changes are seen.  No a small bowel obstruction is noted.  She develops worsening BL wheezing and dyspnea, and Hypoxia, Oxygen sat drop to 80 % on RA> she was transfer to step down for closer monitoring.    Assessment & Plan:   Principal Problem:   Abdominal pain Active Problems:   COPD (chronic obstructive pulmonary disease) (HCC)   Constipation   Leukocytosis   AKI (acute kidney injury) (HCC)   Elevated transaminase level   Ileus (HCC)  1-Acute Hypoxic respiratory Failure.  Asthma/COPD -In setting on asthma vs hypoventilation from abdominal process.  -Continue with schedule duo-Nebulizer. Pulmicort.  -ABG. No hypercapnia.  -Chest x ray showed hypoventilation.  -Continue to monitor. Incentive spirometry ordered.  On Room air today. Plan to transfer to med surgery.   2-Abdominal pain, colonic ileum:  -Surgery Consulted, recommend support care, no surgical intervention.  -KUB; showed colonic distension.  -GI has been consulted. Discussed with GI will start clear.  -Continue to replete electrolytes.  -Last BM 7/25. -didn't received enema 7/27. To received enema today.  Started on clear liquid diet.    3-Leukocytosis;  -CT abdomen  pelvis negative. Patchy opacities lower lobes.  -Resolved.   4-AKI;  -Presents with cr 1.1 -Previous baseline 0.6--0.9 -Received IV fluids.  -Distended bladder on CT.  -Foley catheter placed.   5-Hypokalemia; Replaced.   Mild transaminases.  -Status postcholecystectomy.  CT abdomen showed no focal liver abnormality. -Follow trends.  Could be in the setting of dehydration  Hypertension:  -Continue to hold hydrochlorothiazide and losartan due to mild -AKI.  As needed hydralazine. -Start Norvasc.   Dementia: Worse over the last 6 months per family. Acute metabolic encephalopathy, delirium.  TSH:0.9, B:12: 771, thiamine: pending. Started on IV thiamine.  Ammonia normal.  No reversible cause identified. Suspect she has had delirium and underline worsening dementia.      Estimated body mass index is 30.23 kg/m as calculated from the following:   Height as of 07/21/16: 5\' 5"  (1.651 m).   Weight as of this encounter: 82.4 kg.   DVT prophylaxis: Lovenox Code Status: Full code Family Communication: Daughter 7/27 Disposition Plan:  Status is: Observation The patient will require care spanning > 2 midnights and should be moved to inpatient because: colonic ileus    Consultants:  Surgery GI  Procedures:    Antimicrobials:    Subjective: She is more alert today, denies pain  She is answering questions today.   Objective: Vitals:   09/20/22 0732 09/20/22 0733 09/20/22 0834 09/20/22 0920  BP:    (!) 170/78  Pulse:      Resp:      Temp:   98.3 F (36.8 C)   TempSrc:   Oral   SpO2: 97% 94%    Weight:        Intake/Output Summary (Last  24 hours) at 09/20/2022 0922 Last data filed at 09/20/2022 0200 Gross per 24 hour  Intake 1147.94 ml  Output 250 ml  Net 897.94 ml   Filed Weights   09/15/22 2233  Weight: 82.4 kg    Examination:  General exam: Alert. Follows command Respiratory system: BL air movement no wheezing.  Cardiovascular system: S 1,S 2  RRR Gastrointestinal system: BS present, soft, nt Central nervous system: Alert, follows command Extremities: No edema.     Data Reviewed: I have personally reviewed following labs and imaging studies  CBC: Recent Labs  Lab 09/15/22 1600 09/16/22 0612 09/17/22 0323 09/18/22 0329 09/20/22 0244  WBC 15.1* 10.1 6.7 5.4 5.9  NEUTROABS 12.3*  --   --   --   --   HGB 11.4* 10.5* 10.5* 9.6* 10.1*  HCT 35.9* 32.6* 33.3* 30.2* 31.6*  MCV 98.9 98.8 99.7 100.7* 99.7  PLT 219 214 216 205 237   Basic Metabolic Panel: Recent Labs  Lab 09/16/22 1319 09/16/22 1923 09/17/22 0323 09/18/22 0329 09/19/22 0317 09/20/22 0244  NA  --  142 143 143 143 143  K  --  3.4* 3.2* 3.2* 3.8 4.1  CL  --  107 109 111 114* 114*  CO2  --  27 27 25 24  21*  GLUCOSE  --  120* 106* 123* 112* 112*  BUN  --  25* 24* 18 17 14   CREATININE  --  0.68 0.69 0.65 0.64 0.70  CALCIUM  --  9.3 9.2 8.7* 8.5* 8.8*  MG 2.3  --  2.5*  --  2.3 2.4   GFR: CrCl cannot be calculated (Unknown ideal weight.). Liver Function Tests: Recent Labs  Lab 09/15/22 1600 09/16/22 0612 09/17/22 0323  AST 48* 33 25  ALT 55* 43 33  ALKPHOS 60 51 48  BILITOT 0.7 0.6 0.4  PROT 7.2 6.5 6.4*  ALBUMIN 3.5 3.2* 3.1*   Recent Labs  Lab 09/15/22 1600  LIPASE 32   Recent Labs  Lab 09/16/22 1413  AMMONIA 12   Coagulation Profile: No results for input(s): "INR", "PROTIME" in the last 168 hours. Cardiac Enzymes: No results for input(s): "CKTOTAL", "CKMB", "CKMBINDEX", "TROPONINI" in the last 168 hours. BNP (last 3 results) No results for input(s): "PROBNP" in the last 8760 hours. HbA1C: No results for input(s): "HGBA1C" in the last 72 hours. CBG: Recent Labs  Lab 09/19/22 1654 09/19/22 1928 09/19/22 2356 09/20/22 0432 09/20/22 0815  GLUCAP 120* 97 93 98 108*   Lipid Profile: No results for input(s): "CHOL", "HDL", "LDLCALC", "TRIG", "CHOLHDL", "LDLDIRECT" in the last 72 hours. Thyroid Function Tests: No results for  input(s): "TSH", "T4TOTAL", "FREET4", "T3FREE", "THYROIDAB" in the last 72 hours.  Anemia Panel: No results for input(s): "VITAMINB12", "FOLATE", "FERRITIN", "TIBC", "IRON", "RETICCTPCT" in the last 72 hours.  Sepsis Labs: No results for input(s): "PROCALCITON", "LATICACIDVEN" in the last 168 hours.  Recent Results (from the past 240 hour(s))  MRSA Next Gen by PCR, Nasal     Status: Abnormal   Collection Time: 09/16/22  3:17 AM   Specimen: Nasal Mucosa; Nasal Swab  Result Value Ref Range Status   MRSA by PCR Next Gen DETECTED (A) NOT DETECTED Final    Comment: (NOTE) The GeneXpert MRSA Assay (FDA approved for NASAL specimens only), is one component of a comprehensive MRSA colonization surveillance program. It is not intended to diagnose MRSA infection nor to guide or monitor treatment for MRSA infections. Test performance is not FDA approved in patients less than  69 years old. Performed at [facility], [address], [place], [place] [address]          Radiology Studies: No results found.      Scheduled Meds:  amLODipine  5 mg Oral Daily   budesonide (PULMICORT) nebulizer solution  0.25 mg Nebulization BID   Chlorhexidine Gluconate Cloth  6 each Topical Q0600   enoxaparin (LOVENOX) injection  40 mg Subcutaneous Q24H   ipratropium-albuterol  3 mL Nebulization TID   mupirocin ointment  1 Application Nasal BID   QUEtiapine  25 mg Oral QHS   sodium phosphate  1 enema Rectal Once   Continuous Infusions:  acetaminophen 1,000 mg (09/20/22 0919)   dextrose 5% lactated ringers       LOS: 4 days    Time spent: 35 minutes    Pearlena Ow A Joelee Snoke, MD [facility]   If 7PM-7AM, please contact night-coverage [email or web address]  09/20/2022, 9:22 AM

## 2022-09-20 NOTE — Evaluation (Signed)
Physical Therapy Evaluation-1x Patient Details Name: Leslie Farley MRN: [id number] DOB: 1930-09-01 Today's Date: 09/20/2022  History of Present Illness  89 yo female admitted from SNF (LTC resident) with abdominal pain, colonic ileus, acute resp failure. Hx of COPD, constipation, fecal impaction, dementia, asthma, chronic anemia, cholecystectomy  Clinical Impression  Limited bed level eval only. Pt was alert. Attempted to assess patient's UEs/LEs-limited by patient cognition and language barrier-pt attempted to move UEs/LEs along with therapist during assessment. She did make some vocalizations after assessment. No family present during session. Per chart review, pt is LTC SNF resident with plans to return once medically ready. 1x eval. Will sign off.         Assistance Recommended at Discharge Frequent or constant Supervision/Assistance  If plan is discharge home, recommend the following:  Can travel by private vehicle      No    Equipment Recommendations None recommended by PT  Recommendations for Other Services       Functional Status Assessment Patient has not had a recent decline in their functional status     Precautions / Restrictions Precautions Precautions: Fall Precaution Comments: in mittens Restrictions Weight Bearing Restrictions: No      Mobility  Bed Mobility               General bed mobility comments: NT    Transfers                        Ambulation/Gait                  Stairs            Wheelchair Mobility     Tilt Bed    Modified Rankin (Stroke Patients Only)       Balance                                             Pertinent Vitals/Pain Pain Assessment Pain Assessment: Faces Faces Pain Scale: No hurt    Home Living Family/patient expects to be discharged to:: Skilled nursing facility                   Additional Comments: pt unable to provide PLOF. per chart review,  pt is LTC SNF resident    Prior Function                       Hand Dominance        Extremity/Trunk Assessment   Upper Extremity Assessment Upper Extremity Assessment: Generalized weakness;Difficult to assess due to impaired cognition (pt attempted to move UEs with therapist while ROM was assessed)    Lower Extremity Assessment Lower Extremity Assessment: Generalized weakness;Difficult to assess due to impaired cognition (pt attempted to move LEs with therapist while ROM was assessed)       Communication   Communication: Prefers language other than English  Cognition Arousal/Alertness: Awake/alert Behavior During Therapy: WFL for tasks assessed/performed Overall Cognitive Status: History of cognitive impairments - at baseline Pt did respond with "au revoir" after therapist said it                                          General Comments  Exercises     Assessment/Plan    PT Assessment Patient does not need any further PT services  PT Problem List         PT Treatment Interventions      PT Goals (Current goals can be found in the Care Plan section)  Acute Rehab PT Goals Patient Stated Goal: pt unable PT Goal Formulation: All assessment and education complete, DC therapy    Frequency       Co-evaluation               AM-PAC PT "6 Clicks" Mobility  Outcome Measure Help needed turning from your back to your side while in a flat bed without using bedrails?: Total Help needed moving from lying on your back to sitting on the side of a flat bed without using bedrails?: Total Help needed moving to and from a bed to a chair (including a wheelchair)?: Total Help needed standing up from a chair using your arms (e.g., wheelchair or bedside chair)?: Total Help needed to walk in hospital room?: Total Help needed climbing 3-5 steps with a railing? : Total 6 Click Score: 6    End of Session   Activity Tolerance: Patient tolerated  treatment well Patient left: in bed;with call bell/phone within reach;with bed alarm set (with mittens in place)        Time: 1300-1308 PT Time Calculation (min) (ACUTE ONLY): 8 min   Charges:   PT Evaluation $PT Eval Low Complexity: 1 Low   PT General Charges $$ ACUTE PT VISIT: 1 Visit           Faye Ramsay, PT Acute Rehabilitation  Office: [phone]

## 2022-09-20 NOTE — Evaluation (Signed)
Occupational Therapy Evaluation Patient Details Name: Leslie Farley MRN: [id number] DOB: 1930/12/02 Today's Date: 09/20/2022   History of Present Illness 89 yo female admitted from SNF (LTC resident) with abdominal pain, colonic ileus, acute resp failure. Hx of COPD, constipation, fecal impaction, dementia, asthma, chronic anemia, cholecystectomy   Clinical Impression   Pt is currently limited by below listed deficits (see OT problem list), impacting her ADL performance.  At current, she requires max to total assist x2 for bed mobility, total assist for lower body dressing, max assist for self-feeding, and mod-max assist for sitting balance edge of bed (R sided lean noted in sitting). She was noted to be oriented to person and place, however disoriented to time and situation. She could follow simple 1 step commands ~50% of the time with increased time and occasional repetition. Per her daughter, the pt has presented with functional decline over the past ~6 months; prior to that, she could feed herself and perform stand-pivot transfers with assist into & out of her wheelchair. OT will trial the pt for therapy services in the acute setting at a frequency of 1x per week, in order to determine if she can benefit from skilled OT services. OT recommends she return to her long-term care facility at discharge.      Recommendations for follow up therapy are one component of a multi-disciplinary discharge planning process, led by the attending physician.  Recommendations may be updated based on patient status, additional functional criteria and insurance authorization.   Assistance Recommended at Discharge Frequent or constant Supervision/Assistance  Patient can return home with the following Assistance with feeding;Two people to help with walking and/or transfers;A lot of help with bathing/dressing/bathroom;Direct supervision/assist for medications management    Functional Status Assessment  Patient  has had a recent decline in their functional status and demonstrates the ability to make significant improvements in function in a reasonable and predictable amount of time.  Equipment Recommendations  None recommended by OT    Recommendations for Other Services       Precautions / Restrictions Precautions Precautions: Fall Precaution Comments: in mittens Restrictions Weight Bearing Restrictions: No      Mobility Bed Mobility Overal bed mobility: Needs Assistance Bed Mobility: Supine to Sit, Sit to Supine     Supine to sit: Max assist, +2 for physical assistance Sit to supine: Total assist, +2 for physical assistance            Balance     Sitting balance-Leahy Scale: Poor Sitting balance - Comments: R sided lean noted         ADL either performed or assessed with clinical judgement   ADL Overall ADL's : Needs assistance/impaired Eating/Feeding: Cueing for sequencing;Maximal assistance Eating/Feeding Details (indicate cue type and reason): Pt was able to bring a cup to her mouth and sip from a straw, once the cup was placed in her R hand and verbal with tactile cues were provided to initiate tasks. Grooming: Total assistance;Bed level           Upper Body Dressing : Total assistance;Bed level   Lower Body Dressing: Total assistance;Bed level       Toileting- Clothing Manipulation and Hygiene: Total assistance;Bed level                            Pertinent Vitals/Pain Pain Assessment Pain Assessment: No/denies pain     Hand Dominance  Right    Extremity/Trunk Assessment Upper Extremity Assessment  Upper Extremity Assessment: Generalized weakness; Left Upper Extremity Assessment: chronic shoulder AROM limitations, able to demo gross hand grasp, AAROM for elbow WFL. increased edema noted  Right Upper Extremity Assessment:chronic shoulder AROM limitations, able to demo gross hand grasp, AAROM for elbow [facility]    Lower Extremity  Assessment Lower Extremity Assessment: Generalized weakness;Patient required mostly PROM       Communication Communication Communication: Prefers language other than English (Pt speaks [place]; her daughter was present and interpreted session.)   Cognition Arousal/Alertness: Awake/alert Behavior During Therapy: Flat affect Overall Cognitive Status: History of cognitive impairments - at baseline Area of Impairment: Orientation, Memory, Problem solving        Orientation Level: Disoriented to, Time, Situation   Memory: Decreased short-term memory       Problem Solving: Slow processing, Decreased initiation, Requires verbal cues, Requires tactile cues General Comments: Oriented to person and place, able to follow simple 1 step commands ~50% of the time though needing increased time for processing                Home Living Family/patient expects to be discharged to:: Skilled nursing facility (She is a long-term care resident at [facility].)              Prior Functioning/Environment Prior Level of Function : Needs assist  Cognitive Assist : Mobility (cognitive);ADLs (cognitive)     Physical Assist : Mobility (physical);ADLs (physical)     Mobility Comments: Pt's daughter was present and provided information regarding the pt's prior level of functioning & living situation. Prior to ~6 months ago, the pt could perform stand-pivot transfers into and out of a wheelchair with assist, as well as propel her wheelchair with her feet. The nursing staff have been hoyer lifting her into & out of the bed recently & she has needed total assist to propel wheelchair. ADLs Comments: Pt required max-total assist for all ADLs, including feeding, bathing, and dressing. Prior to ~6 months ago, the pt could feed herself independently and assist with other self care tasks.        OT Problem List: Decreased strength;Decreased range of motion;Decreased activity tolerance;Impaired balance  (sitting and/or standing);Decreased coordination;Decreased cognition;Decreased knowledge of use of DME or AE;Impaired UE functional use;Increased edema      OT Treatment/Interventions: Self-care/ADL training;Therapeutic exercise;Neuromuscular education;Energy conservation;DME and/or AE instruction;Therapeutic activities;Cognitive remediation/compensation;Patient/family education;Visual/perceptual remediation/compensation;Balance training    OT Goals(Current goals can be found in the care plan section) Acute Rehab OT Goals OT Goal Formulation: With patient/family Time For Goal Achievement: 10/04/22 Potential to Achieve Goals: Fair ADL Goals Pt Will Perform Eating: with mod assist;bed level Pt Will Perform Grooming: with mod assist;bed level Pt Will Perform Upper Body Dressing: with mod assist;bed level Additional ADL Goal #1: The pt will sit edge of bed for 10 minutes with no more than min assist, in order to prep her for progressive ADL participation.  OT Frequency: Min 1X/week       AM-PAC OT "6 Clicks" Daily Activity     Outcome Measure Help from another person eating meals?: A Lot Help from another person taking care of personal grooming?: Total Help from another person toileting, which includes using toliet, bedpan, or urinal?: Total Help from another person bathing (including washing, rinsing, drying)?: Total Help from another person to put on and taking off regular upper body clothing?: Total Help from another person to put on and taking off regular lower body clothing?: Total 6 Click Score: 7   End of Session  Equipment Utilized During Treatment: Other (comment) (N/A) Nurse Communication: Mobility status  Activity Tolerance: Other (comment) (Fair overal tolerance) Patient left: in bed;with call bell/phone within reach;with bed alarm set;with family/visitor present  OT Visit Diagnosis: Muscle weakness (generalized) (M62.81);Feeding difficulties (R63.3);Other symptoms and signs  involving cognitive function                Time: 1555-1620 OT Time Calculation (min): 25 min Charges:  OT General Charges $OT Visit: 1 Visit OT Evaluation $OT Eval Moderate Complexity: 1 Mod OT Treatments $Therapeutic Activity: 8-22 mins    Reuben Likes, OTR/L 09/20/2022, 4:41 PM

## 2022-09-20 NOTE — Progress Notes (Signed)
Daily Progress Note   Patient Name: Leslie Farley       Date: 09/20/2022 DOB: [birth year]  Age: 89 y.o. MRN#: [id number] Attending Physician: Alba Cory, MD Primary Care Physician: Pcp, No Admit Date: 09/15/2022 Length of Stay: 4 days  Reason for Consultation/Follow-up: Establishing goals of care  Subjective:   CC: Patient laying calming in bed. No family at bedside. Following up regarding complex medical decision making.   Subjective:  Reviewed EMR prior to presenting to bedside.  Discussed care with bedside RN for updates.  Palliative medicine physician, Dr. Linna Darner, had met with family on 726 and plan at that time was continue appropriate medical care to determine if patient will stabilize to point she was at before she came into the hospital.  Should patient deteriorate, then could consider comfort focused care/hospice.  Patient's daughter works as a Child psychotherapist for [employer] or care hospice.  Limitation of interventions at that time was DNR/DNI. Continuing appropraite medical care.  Patient did not receive enema yesterday though scheduled to have 1 today.  Presented to patient's bedside while both in ICU and then when transferred to floor room.  No family present at bedside during either visit.  Discussed care with bedside RNs for updates.  When seen, patient laying comfortably in the bed.  Patient awake and somewhat interactive though mumbles at times.  Did not see any signs of pain or discomfort.  Able to reach out to patient's hospitalist to coordinate care.  Review of Systems Appears comfortable Objective:   Vital Signs:  BP (!) 170/78   Pulse 86   Temp 98.3 F (36.8 C) (Oral)   Resp 17   Wt 82.4 kg   SpO2 94%   BMI 30.23 kg/m   Physical Exam: General: NAD, awake, laying in bed Eyes: no drainage noted HENT: moist mucous membranes Cardiovascular: RRR, edema in Ue's  Respiratory: no increased work of breathing noted, not in respiratory distress Abdomen:  obese Neuro: awake, interactive  Imaging:  I personally reviewed recent imaging.   Assessment & Plan:   Assessment: Patient is an 89 year old female from [facility], has been living in long-term facilities for the last 7 years, past medical history of dementia worsening over the last 6 months, coronary artery disease, asthma, COPD, GERD, hypertension, chronic anemia, and history of cholecystectomy. Palliative medicine consult to assist with complex medical decision making.   Recommendations/Plan: # Complex medical decision making/goals of care:  - Patient unable to spent in complex medical decision-making due to her current medical status.  -As per EMR review and discussion with family on 7/26, continuing with appropriate medical care at this time with hope patient will be returning to her baseline to return to her long-term care facility (allowing time for outcomes). Should patient deteriorate, then can talk with family to consider transition to comfort focused care/hospice.  At this time patient is maintaining current medical status.  Continuing to receive appropriate interventions.  No family present at bedside today.  Palliative medicine team will continue to engage in conversations as able moving forward.  -  Code Status: DNR  # Symptom management:  -As per hospitalist  # Psychosocial Support:  - 2 daughters  # Discharge Planning: To Be Determined  Thank you for allowing the palliative care team to participate in the care Leslie Farley.  Alvester Morin, DO Palliative Care Provider PMT # [phone]  If patient remains symptomatic despite maximum doses, please call PMT at [phone] between 0700 and 1900. Outside of  these hours, please call attending, as PMT does not have night coverage.  *Please note that this is a verbal dictation therefore any spelling or grammatical errors are due to the "[employer]agon [employer] One" system interpretation.

## 2022-09-20 NOTE — Progress Notes (Signed)
CBG Q4H discontinued per Dr. Sunnie Nielsen.   Patient's daughter was in the room visiting this afternoon and fed Leslie Farley her lunch. She did well with clears.

## 2022-09-21 DIAGNOSIS — Z515 Encounter for palliative care: Secondary | ICD-10-CM | POA: Diagnosis not present

## 2022-09-21 MED ORDER — POTASSIUM CHLORIDE 10 MEQ/100ML IV SOLN
10.0000 meq | INTRAVENOUS | Status: AC
  Administered 2022-09-21 (×3): 10 meq via INTRAVENOUS
  Filled 2022-09-21 (×3): qty 100

## 2022-09-21 MED ORDER — POTASSIUM CHLORIDE 20 MEQ PO PACK
40.0000 meq | PACK | ORAL | Status: DC
  Administered 2022-09-21: 40 meq via ORAL
  Filled 2022-09-21: qty 2

## 2022-09-21 MED ORDER — POLYETHYLENE GLYCOL 3350 17 G PO PACK
17.0000 g | PACK | Freq: Every day | ORAL | Status: DC
  Filled 2022-09-21 (×2): qty 1

## 2022-09-21 MED ORDER — POTASSIUM CHLORIDE CRYS ER 20 MEQ PO TBCR: 40.0000 meq | EXTENDED_RELEASE_TABLET | Freq: Once | ORAL | Status: DC

## 2022-09-21 NOTE — Progress Notes (Signed)
Subjective: Patient appears to be moaning.  Does not open eyes to command.  Not able to speak even in a few words.  As per nurse she was given enema yesterday and had multiple bowel movements.  She was able to take about 3 ounces of applesauce from medication in the morning.  Objective: Vital signs in last 24 hours: Temp:  [98.3 F (36.8 C)-98.6 F (37 C)] 98.4 F (36.9 C) (07/29 0524) Pulse Rate:  [72-82] 72 (07/29 0524) Resp:  [16-21] 18 (07/29 0524) BP: (130-173)/(63-72) 173/70 (07/29 0524) SpO2:  [93 %-100 %] 98 % (07/29 0908) Weight change:  Last BM Date : 09/21/22  PE: Moaning, does not open her eyes, does not speak in few sentences GENERAL: Appears to be in discomfort  ABDOMEN: Distended, sluggish bowel sounds EXTREMITIES: No edema Lab Results: Results for orders placed or performed during the hospital encounter of 09/15/22 (from the past 48 hour(s))  Glucose, capillary     Status: Abnormal   Collection Time: 09/19/22 11:30 AM  Result Value Ref Range   Glucose-Capillary 110 (H) 70 - 99 mg/dL    Comment: Glucose reference range applies only to samples taken after fasting for at least 8 hours.  Glucose, capillary     Status: Abnormal   Collection Time: 09/19/22  4:54 PM  Result Value Ref Range   Glucose-Capillary 120 (H) 70 - 99 mg/dL    Comment: Glucose reference range applies only to samples taken after fasting for at least 8 hours.  Glucose, capillary     Status: None   Collection Time: 09/19/22  7:28 PM  Result Value Ref Range   Glucose-Capillary 97 70 - 99 mg/dL    Comment: Glucose reference range applies only to samples taken after fasting for at least 8 hours.   Comment 1 Notify RN    Comment 2 Document in Chart   Glucose, capillary     Status: None   Collection Time: 09/19/22 11:56 PM  Result Value Ref Range   Glucose-Capillary 93 70 - 99 mg/dL    Comment: Glucose reference range applies only to samples taken after fasting for at least 8 hours.  CBC      Status: Abnormal   Collection Time: 09/20/22  2:44 AM  Result Value Ref Range   WBC 5.9 4.0 - 10.5 K/uL   RBC 3.17 (L) 3.87 - 5.11 MIL/uL   Hemoglobin 10.1 (L) 12.0 - 15.0 g/dL   HCT 99.3 (L) 57.0 - 17.7 %   MCV 99.7 80.0 - 100.0 fL   MCH 31.9 26.0 - 34.0 pg   MCHC 32.0 30.0 - 36.0 g/dL   RDW 93.9 03.0 - 09.2 %   Platelets 237 150 - 400 K/uL   nRBC 0.0 0.0 - 0.2 %    Comment: Performed at [facility], [address], [place], [place] [address]  Basic metabolic panel     Status: Abnormal   Collection Time: 09/20/22  2:44 AM  Result Value Ref Range   Sodium 143 135 - 145 mmol/L   Potassium 4.1 3.5 - 5.1 mmol/L   Chloride 114 (H) 98 - 111 mmol/L   CO2 21 (L) 22 - 32 mmol/L   Glucose, Bld 112 (H) 70 - 99 mg/dL    Comment: Glucose reference range applies only to samples taken after fasting for at least 8 hours.   BUN 14 8 - 23 mg/dL   Creatinine, Ser 6.22 0.44 - 1.00 mg/dL   Calcium 8.8 (L)  8.9 - 10.3 mg/dL   GFR, Estimated >33 >29 mL/min    Comment: (NOTE) Calculated using the CKD-EPI Creatinine Equation (2021)    Anion gap 8 5 - 15    Comment: Performed at [facility], [address], [place], [place] [address]  Magnesium     Status: None   Collection Time: 09/20/22  2:44 AM  Result Value Ref Range   Magnesium 2.4 1.7 - 2.4 mg/dL    Comment: Performed at [facility], [address] [place], [place] [address]  Glucose, capillary     Status: None   Collection Time: 09/20/22  4:32 AM  Result Value Ref Range   Glucose-Capillary 98 70 - 99 mg/dL    Comment: Glucose reference range applies only to samples taken after fasting for at least 8 hours.  Glucose, capillary     Status: Abnormal   Collection Time: 09/20/22  8:15 AM  Result Value Ref Range   Glucose-Capillary 108 (H) 70 - 99 mg/dL    Comment: Glucose reference range applies only to samples taken after fasting for at least 8 hours.  Glucose, capillary     Status:  None   Collection Time: 09/20/22 12:22 PM  Result Value Ref Range   Glucose-Capillary 99 70 - 99 mg/dL    Comment: Glucose reference range applies only to samples taken after fasting for at least 8 hours.  CBC     Status: Abnormal   Collection Time: 09/21/22  4:30 AM  Result Value Ref Range   WBC 6.6 4.0 - 10.5 K/uL   RBC 3.21 (L) 3.87 - 5.11 MIL/uL   Hemoglobin 10.1 (L) 12.0 - 15.0 g/dL   HCT 30.1 (L) 60.1 - 09.3 %   MCV 101.6 (H) 80.0 - 100.0 fL   MCH 31.5 26.0 - 34.0 pg   MCHC 31.0 30.0 - 36.0 g/dL   RDW 23.5 57.3 - 22.0 %   Platelets 205 150 - 400 K/uL   nRBC 0.0 0.0 - 0.2 %    Comment: Performed at [facility], [address] [place], [place] [address]  Basic metabolic panel     Status: Abnormal   Collection Time: 09/21/22  4:30 AM  Result Value Ref Range   Sodium 145 135 - 145 mmol/L   Potassium 3.5 3.5 - 5.1 mmol/L   Chloride 115 (H) 98 - 111 mmol/L   CO2 24 22 - 32 mmol/L   Glucose, Bld 88 70 - 99 mg/dL    Comment: Glucose reference range applies only to samples taken after fasting for at least 8 hours.   BUN 14 8 - 23 mg/dL   Creatinine, Ser 0.62 0.44 - 1.00 mg/dL   Calcium 8.8 (L) 8.9 - 10.3 mg/dL   GFR, Estimated >37 >62 mL/min    Comment: (NOTE) Calculated using the CKD-EPI Creatinine Equation (2021)    Anion gap 6 5 - 15    Comment: Performed at [facility], [address], [place], [place] [address]    Studies/Results: No results found.  Medications: I have reviewed the patient's current medications.  Assessment: Colonic ileus CT abdomen pelvis without contrast 07/21/2022 showed no acute abdominal pathology, severe sigmoid diverticulosis Abdominal x-ray 09/18/2022 showed diffuse colonic ileus   History of acute hypoxic respiratory failure Dementia  Plan: Discussed with patient's nurse, patient is able to tolerate clear liquids, some amount of applesauce however, maintaining nutrition might be an issue if she is  not completely  alert and awake. Patient is able to tolerate enemas and has been having bowel movements. Advised patient's nurse to continue patient on MiraLAX. Keep potassium above 4, keep magnesium above 2, minimize opioids. Continue supportive care, advance diet as tolerated, continue MiraLAX 1-2 times a day, please recall GI if needed. GI will sign off.  Kerin Salen, MD 09/21/2022, 9:23 AM

## 2022-09-21 NOTE — Progress Notes (Signed)
Dear Doctor:  Assessed for PIV insert with [place] on bilateral UE's with no suitable veins noted. This patient has been identified as a candidate for PICC /cental line for the following reason (s): IV therapy over 48 hours, poor veins/poor circulatory system (CHF, COPD, emphysema, diabetes, steroid use, IV drug abuse, etc.), and restarts due to phlebitis and infiltration in 24 hours   Thank you for supporting the early vascular access assessment program.

## 2022-09-21 NOTE — Progress Notes (Signed)
PROGRESS NOTE    Leslie Farley  ZOX:[id number] DOB: 1930-08-03 DOA: 09/15/2022 PCP: Pcp, No   Brief Narrative: 89 year old French-speaking female with medical history significant for  dementia, worse over last 6 months, CAD, asthma, COPD, GERD, hypertension, chronic anemia, history of cholecystectomy admitted early February this year for Esther coral colitis, severe constipation and fecal impaction presented to the ED from nursing home for evaluation of abdominal pain and constipation.  CT abdomen and pelvis show patchy airspace opacity in the lower lobe bilaterally new from previous exam and felt to be postinflammatory in nature.  Diverticulosis without diverticulitis, mild distention of the colon with air and fluid within no obstructive changes are seen.  No a small bowel obstruction is noted.  She develops worsening BL wheezing and dyspnea, and Hypoxia, Oxygen sat drop to 80 % on RA> she was transfer to step down for closer monitoring.    Assessment & Plan:   Principal Problem:   Abdominal pain Active Problems:   COPD (chronic obstructive pulmonary disease) (HCC)   Constipation   Leukocytosis   AKI (acute kidney injury) (HCC)   Elevated transaminase level   Ileus (HCC)   Palliative care encounter   Goals of care, counseling/discussion  1-Acute Hypoxic respiratory Failure.  Asthma/COPD -In setting on asthma vs hypoventilation from abdominal process.  -Continue with schedule duo-Nebulizer. Pulmicort.  -ABG. No hypercapnia.  -Chest x ray showed hypoventilation.  -Continue to monitor. Incentive spirometry ordered.  On Room air, transfer to Med-surgery 7/28.  2-Abdominal pain, colonic ileum:  -Surgery Consulted, recommend support care, no surgical intervention.  -KUB; showed colonic distension.  -GI has been consulted.  -Continue to replete electrolytes.  -Last BM 7/25. -Received Enema 7/28--had Multiples BM.  Started on clear liquid diet. Advanced to full liquid diet,  if she is alert enough.  Sleepy today again.   3-Dementia; Acute metabolic encephalopathy, delirium.  Worse over the last 6 months per family. TSH:0.9, B:12: 771, thiamine: 135. Received  IV thiamine.  Ammonia normal.  No reversible cause identified. Suspect she has had delirium and underline worsening dementia.  She was more alert yesterday, she ate some provided by daughter. She is sleepy today.   Leukocytosis;  -CT abdomen pelvis negative. Patchy opacities lower lobes.  -Resolved.   4-AKI;  -Presents with cr 1.1 -Previous baseline 0.6--0.9 -Received IV fluids.  -Distended bladder on CT.  -Foley catheter placed.   5-Hypokalemia; Replaced.   Mild transaminases.  -Status postcholecystectomy.  CT abdomen showed no focal liver abnormality. -Follow trends.  Could be in the setting of dehydration  Hypertension:  -Continue to hold hydrochlorothiazide and losartan due to mild -AKI.  As needed hydralazine. -Continue with  Norvasc.        Estimated body mass index is 30.23 kg/m as calculated from the following:   Height as of 07/21/16: 5\' 5"  (1.651 m).   Weight as of this encounter: 82.4 kg.   DVT prophylaxis: Lovenox Code Status: Full code Family Communication: Daughter 7/27 Disposition Plan:  Status is: Observation The patient will require care spanning > 2 midnights and should be moved to inpatient because: colonic ileus    Consultants:  Surgery GI  Procedures:    Antimicrobials:    Subjective: She is sleepy today,. Didn't received sedatives today.  She had multiples BM after enema.   Objective: Vitals:   09/20/22 2143 09/21/22 0139 09/21/22 0524 09/21/22 0908  BP: (!) 161/69 130/69 (!) 173/70   Pulse: 82 74 72   Resp: 19 16 18  Temp: 98.6 F (37 C) 98.4 F (36.9 C) 98.4 F (36.9 C)   TempSrc: Oral Oral Oral   SpO2: 100% 100% 100% 98%  Weight:        Intake/Output Summary (Last 24 hours) at 09/21/2022 1236 Last data filed at 09/21/2022  1100 Gross per 24 hour  Intake 260.94 ml  Output 475 ml  Net -214.06 ml   Filed Weights   09/15/22 2233  Weight: 82.4 kg    Examination:  General exam: Sleepy  Respiratory system: BL air movement Cardiovascular system: S 1, S 2 RRR Gastrointestinal system: BS present, soft, nt Central nervous system: Sleepy  Extremities: traced edema    Data Reviewed: I have personally reviewed following labs and imaging studies  CBC: Recent Labs  Lab 09/15/22 1600 09/16/22 0612 09/17/22 0323 09/18/22 0329 09/20/22 0244 09/21/22 0430  WBC 15.1* 10.1 6.7 5.4 5.9 6.6  NEUTROABS 12.3*  --   --   --   --   --   HGB 11.4* 10.5* 10.5* 9.6* 10.1* 10.1*  HCT 35.9* 32.6* 33.3* 30.2* 31.6* 32.6*  MCV 98.9 98.8 99.7 100.7* 99.7 101.6*  PLT 219 214 216 205 237 205   Basic Metabolic Panel: Recent Labs  Lab 09/16/22 1319 09/16/22 1923 09/17/22 0323 09/18/22 0329 09/19/22 0317 09/20/22 0244 09/21/22 0430  NA  --    < > 143 143 143 143 145  K  --    < > 3.2* 3.2* 3.8 4.1 3.5  CL  --    < > 109 111 114* 114* 115*  CO2  --    < > 27 25 24  21* 24  GLUCOSE  --    < > 106* 123* 112* 112* 88  BUN  --    < > 24* 18 17 14 14   CREATININE  --    < > 0.69 0.65 0.64 0.70 0.73  CALCIUM  --    < > 9.2 8.7* 8.5* 8.8* 8.8*  MG 2.3  --  2.5*  --  2.3 2.4  --    < > = values in this interval not displayed.   GFR: CrCl cannot be calculated (Unknown ideal weight.). Liver Function Tests: Recent Labs  Lab 09/15/22 1600 09/16/22 0612 09/17/22 0323  AST 48* 33 25  ALT 55* 43 33  ALKPHOS 60 51 48  BILITOT 0.7 0.6 0.4  PROT 7.2 6.5 6.4*  ALBUMIN 3.5 3.2* 3.1*   Recent Labs  Lab 09/15/22 1600  LIPASE 32   Recent Labs  Lab 09/16/22 1413  AMMONIA 12   Coagulation Profile: No results for input(s): "INR", "PROTIME" in the last 168 hours. Cardiac Enzymes: No results for input(s): "CKTOTAL", "CKMB", "CKMBINDEX", "TROPONINI" in the last 168 hours. BNP (last 3 results) No results for input(s):  "PROBNP" in the last 8760 hours. HbA1C: No results for input(s): "HGBA1C" in the last 72 hours. CBG: Recent Labs  Lab 09/19/22 1928 09/19/22 2356 09/20/22 0432 09/20/22 0815 09/20/22 1222  GLUCAP 97 93 98 108* 99   Lipid Profile: No results for input(s): "CHOL", "HDL", "LDLCALC", "TRIG", "CHOLHDL", "LDLDIRECT" in the last 72 hours. Thyroid Function Tests: No results for input(s): "TSH", "T4TOTAL", "FREET4", "T3FREE", "THYROIDAB" in the last 72 hours.  Anemia Panel: No results for input(s): "VITAMINB12", "FOLATE", "FERRITIN", "TIBC", "IRON", "RETICCTPCT" in the last 72 hours.  Sepsis Labs: No results for input(s): "PROCALCITON", "LATICACIDVEN" in the last 168 hours.  Recent Results (from the past 240 hour(s))  MRSA Next Gen by PCR, Nasal  Status: Abnormal   Collection Time: 09/16/22  3:17 AM   Specimen: Nasal Mucosa; Nasal Swab  Result Value Ref Range Status   MRSA by PCR Next Gen DETECTED (A) NOT DETECTED Final    Comment: (NOTE) The GeneXpert MRSA Assay (FDA approved for NASAL specimens only), is one component of a comprehensive MRSA colonization surveillance program. It is not intended to diagnose MRSA infection nor to guide or monitor treatment for MRSA infections. Test performance is not FDA approved in patients less than 55 years old. Performed at [facility], [address], [place], [place] [address]          Radiology Studies: No results found.      Scheduled Meds:  amLODipine  5 mg Oral Daily   budesonide (PULMICORT) nebulizer solution  0.25 mg Nebulization BID   enoxaparin (LOVENOX) injection  40 mg Subcutaneous Q24H   ipratropium-albuterol  3 mL Nebulization BID   polyethylene glycol  17 g Oral Daily   QUEtiapine  25 mg Oral QHS   Continuous Infusions:  dextrose 5% lactated ringers Stopped (09/20/22 1400)   potassium chloride       LOS: 5 days    Time spent: 35 minutes    Marieliz Strang A Mingo Siegert, MD [facility]   If 7PM-7AM, please contact night-coverage [email or web address]  09/21/2022, 12:36 PM

## 2022-09-21 NOTE — Progress Notes (Signed)
Daily Progress Note   Patient Name: Leslie Farley       Date: 09/21/2022 DOB: 02/25/1930  Age: 89 y.o. MRN#: [id number] Attending Physician: Alba Cory, MD Primary Care Physician: Pcp, No Admit Date: 09/15/2022  Reason for Consultation/Follow-up: Establishing goals of care  Subjective: Patient seen alongside hospital medicine colleague, noted to be resting in bed, does not awaken, does not appear to have any nonverbal gestures of distress or discomfort, medication history reviewed, no family at bedside.  Length of Stay: 5  Current Medications: Scheduled Meds:   amLODipine  5 mg Oral Daily   budesonide (PULMICORT) nebulizer solution  0.25 mg Nebulization BID   enoxaparin (LOVENOX) injection  40 mg Subcutaneous Q24H   ipratropium-albuterol  3 mL Nebulization BID   polyethylene glycol  17 g Oral Daily   potassium chloride  40 mEq Oral Once   QUEtiapine  25 mg Oral QHS    Continuous Infusions:  dextrose 5% lactated ringers Stopped (09/20/22 1400)    PRN Meds: acetaminophen **OR** acetaminophen, hydrALAZINE, ipratropium-albuterol, LORazepam, mouth rinse  Physical Exam         Elderly lady resting in bed Does not open eyes Appears to be in no distress Abdomen distended No edema  Vital Signs: BP (!) 173/70 (BP Location: Right Arm)   Pulse 72   Temp 98.4 F (36.9 C) (Oral)   Resp 18   Wt 82.4 kg   SpO2 98%   BMI 30.23 kg/m  SpO2: SpO2: 98 % O2 Device: O2 Device: Room Air O2 Flow Rate: O2 Flow Rate (L/min): 2 L/min  Intake/output summary:  Intake/Output Summary (Last 24 hours) at 09/21/2022 1204 Last data filed at 09/21/2022 1100 Gross per 24 hour  Intake 260.94 ml  Output 475 ml  Net -214.06 ml   LBM: Last BM Date : 09/21/22 Baseline Weight: Weight: 82.4  kg Most recent weight: Weight: 82.4 kg       Palliative Assessment/Data:      Patient Active Problem List   Diagnosis Date Noted   Palliative care encounter 09/20/2022   Goals of care, counseling/discussion 09/20/2022   Abdominal pain 09/16/2022   Leukocytosis 09/16/2022   AKI (acute kidney injury) (HCC) 09/16/2022   Elevated transaminase level 09/16/2022   Ileus (HCC) 09/16/2022   Stercoral colitis 03/29/2022   Fecal impaction (HCC)  03/29/2022   Cognitive impairment 03/29/2022   Chronic bronchitis (HCC) 01/15/2016   Insomnia 01/15/2016   Constipation 01/15/2016   CAD (coronary artery disease) of artery bypass graft 01/15/2016   Benign paroxysmal positional vertigo 01/15/2016   History of pulmonary embolism 01/14/2016   Peripheral neuropathy 06/26/2015   Chronic constipation 05/10/2015   COPD (chronic obstructive pulmonary disease) (HCC) 03/01/2015   Acute encephalopathy 11/10/2014   Right thyroid nodule 11/02/2014   Hypertension    GERD (gastroesophageal reflux disease)    Arthritis     Palliative Care Assessment & Plan   Patient Profile:   Assessment: 89 year old lady from long-term care facility, admitted with colonic ileus.  On recent imaging patient found to have severe sigmoid diverticulosis and abdominal x-rays showing diffuse colonic ileus.  Has underlying dementia.  Is a long-term nursing facility resident.  Palliative care following for goals of care discussions.  Recommendations/Plan: Patient did not require rectal tube, she tolerated enema and is having bowel movements.  When family is at bedside they are encouraging and assisting with p.o. intake as well.  Overall, the hope is that if the patient has some degree of stabilization/recovery, then she will transition back to [facility] former with palliative services.  However, if the patient has no signs of meaningful recovery/escalating symptom burden-then we will have to have further goals of care conversations  with the family members, 2 daughters with regards to appropriateness of hospice services at that time.  Palliative team will continue to follow.   Code Status:    Code Status Orders  (From admission, onward)           Start     Ordered   09/18/22 1229  Do not attempt resuscitation (DNR)  Continuous       Question Answer Comment  If patient has no pulse and is not breathing Do Not Attempt Resuscitation   If patient has a pulse and/or is breathing: Medical Treatment Goals LIMITED ADDITIONAL INTERVENTIONS: Use medication/IV fluids and cardiac monitoring as indicated; Do not use intubation or mechanical ventilation (DNI), also provide comfort medications.  Transfer to Progressive/Stepdown as indicated, avoid Intensive Care.   Consent: Discussion documented in EHR or advanced directives reviewed      09/18/22 1228           Code Status History     Date Active Date Inactive Code Status Order ID Comments User Context   09/15/2022 2145 09/18/2022 1228 Full Code [id number]  John Giovanni, MD ED   03/29/2022 2053 04/01/2022 2008 Full Code [id number]  Sunnie Nielsen, DO ED   10/30/2014 2141 11/02/2014 2205 Partial Code [id number]  Lorretta Harp, MD Inpatient       Prognosis: Guarded  Discharge Planning: To Be Determined  Care plan was discussed with interdisciplinary team  Thank you for allowing the Palliative Medicine Team to assist in the care of this patient.  MOD MDM    Greater than 50%  of this time was spent counseling and coordinating care related to the above assessment and plan.  Rosalin Hawking, MD  Please contact Palliative Medicine Team phone at [phone] for questions and concerns.

## 2022-09-22 DIAGNOSIS — Z515 Encounter for palliative care: Secondary | ICD-10-CM | POA: Diagnosis not present

## 2022-09-22 DIAGNOSIS — Z7189 Other specified counseling: Secondary | ICD-10-CM | POA: Diagnosis not present

## 2022-09-22 DIAGNOSIS — F0390 Unspecified dementia without behavioral disturbance: Secondary | ICD-10-CM | POA: Diagnosis not present

## 2022-09-22 DIAGNOSIS — K567 Ileus, unspecified: Secondary | ICD-10-CM | POA: Diagnosis not present

## 2022-09-22 DIAGNOSIS — K5901 Slow transit constipation: Secondary | ICD-10-CM | POA: Diagnosis not present

## 2022-09-22 LAB — BASIC METABOLIC PANEL
Anion gap: 7 (ref 5–15)
BUN: 11 mg/dL (ref 8–23)
CO2: 24 mmol/L (ref 22–32)
Calcium: 9.4 mg/dL (ref 8.9–10.3)
Chloride: 111 mmol/L (ref 98–111)
Creatinine, Ser: 0.68 mg/dL (ref 0.44–1.00)
GFR, Estimated: >60 mL/min (ref >60)
Glucose, Bld: 93 mg/dL (ref 70–99)
Potassium: 3.4 mmol/L — ABNORMAL LOW (ref 3.5–5.1)
Sodium: 142 mmol/L (ref 135–145)

## 2022-09-22 LAB — CBC
HCT: 34.6 % — ABNORMAL LOW (ref 36.0–46.0)
Hemoglobin: 10.9 g/dL — ABNORMAL LOW (ref 12.0–15.0)
MCH: 31.6 pg (ref 26.0–34.0)
MCHC: 31.5 g/dL (ref 30.0–36.0)
MCV: 100.3 fL — ABNORMAL HIGH (ref 80.0–100.0)
Platelets: 228 10*3/uL (ref 150–400)
RBC: 3.45 MIL/uL — ABNORMAL LOW (ref 3.87–5.11)
RDW: 14.8 % (ref 11.5–15.5)
WBC: 5.5 10*3/uL (ref 4.0–10.5)
nRBC: 0 % (ref 0.0–0.2)

## 2022-09-22 LAB — GLUCOSE, CAPILLARY: Glucose-Capillary: 83 mg/dL (ref 70–99)

## 2022-09-22 MED ORDER — QUETIAPINE FUMARATE 25 MG PO TABS: 25.0000 mg | ORAL_TABLET | Freq: Every day | ORAL | 0 refills | Status: DC

## 2022-09-22 MED ORDER — BISACODYL 10 MG RE SUPP: 10.0000 mg | Freq: Every day | RECTAL | 0 refills | Status: DC | PRN

## 2022-09-22 MED ORDER — IPRATROPIUM-ALBUTEROL 0.5-2.5 (3) MG/3ML IN SOLN: 3.0000 mL | Freq: Four times a day (QID) | RESPIRATORY_TRACT | 0 refills | Status: DC | PRN

## 2022-09-22 MED ORDER — IPRATROPIUM-ALBUTEROL 0.5-2.5 (3) MG/3ML IN SOLN: 3.0000 mL | Freq: Two times a day (BID) | RESPIRATORY_TRACT | 0 refills | Status: DC

## 2022-09-22 MED ORDER — AMLODIPINE BESYLATE 5 MG PO TABS: 5.0000 mg | ORAL_TABLET | Freq: Every day | ORAL | 0 refills | Status: DC

## 2022-09-22 NOTE — [place] FL2 (Signed)
[place] MEDICAID FL2 LEVEL OF CARE FORM     IDENTIFICATION  Patient Name: Leslie Farley Birthdate: 01/13/1931 Sex: female Admission Date (Current Location): 09/15/2022  [place] and [place] Number:  [facility] Bast [id number] [facility] Facility and Address:  [facility],  [address], [place] [address]      Provider Number: [id number]  Attending Physician Name and Address:  Alba Cory, MD  Relative Name and Phone Number:  [name] (daughter) Ph: [phone]    Current Level of Care: Hospital Recommended Level of Care: Skilled Nursing Facility Prior Approval Number:    Date Approved/Denied:   PASRR Number: [id number]  Discharge Plan: SNF    Current Diagnoses: Patient Active Problem List   Diagnosis Date Noted   Dementia (HCC) 09/22/2022   Coordination of complex care 09/22/2022   Palliative care encounter 09/20/2022   Goals of care, counseling/discussion 09/20/2022   Abdominal pain 09/16/2022   Leukocytosis 09/16/2022   AKI (acute kidney injury) (HCC) 09/16/2022   Elevated transaminase level 09/16/2022   Ileus (HCC) 09/16/2022   Stercoral colitis 03/29/2022   Fecal impaction (HCC) 03/29/2022   Cognitive impairment 03/29/2022   Chronic bronchitis (HCC) 01/15/2016   Insomnia 01/15/2016   Constipation 01/15/2016   CAD (coronary artery disease) of artery bypass graft 01/15/2016   Benign paroxysmal positional vertigo 01/15/2016   History of pulmonary embolism 01/14/2016   Peripheral neuropathy 06/26/2015   Chronic constipation 05/10/2015   COPD (chronic obstructive pulmonary disease) (HCC) 03/01/2015   Acute encephalopathy 11/10/2014   Right thyroid nodule 11/02/2014   Hypertension    GERD (gastroesophageal reflux disease)    Arthritis     Orientation RESPIRATION BLADDER Height & Weight     Self  Normal Indwelling catheter, Incontinent Weight: 181 lb 10.5 oz (82.4 kg) Height:     BEHAVIORAL SYMPTOMS/MOOD NEUROLOGICAL BOWEL  NUTRITION STATUS     (N/A) Continent Diet (Soft diet)  AMBULATORY STATUS COMMUNICATION OF NEEDS Skin   Extensive Assist Verbally Skin abrasions, Other (Comment) (Abrasions: bilateral buttocks; Erythema: abdomen, groin, breast)                       Personal Care Assistance Level of Assistance  Bathing, Feeding, Dressing Bathing Assistance: Maximum assistance Feeding assistance: Maximum assistance Dressing Assistance: Maximum assistance     Functional Limitations Info  Sight, Hearing, Speech Sight Info: Adequate Hearing Info: Adequate Speech Info: Adequate    SPECIAL CARE FACTORS FREQUENCY                       Contractures Contractures Info: Not present    Additional Factors Info  Code Status, Allergies, Psychotropic Code Status Info: DNR Allergies Info: Cucumber extract Psychotropic Info: Seroquel, Ativan         Current Medications (09/22/2022):  This is the current hospital active medication list Current Facility-Administered Medications  Medication Dose Route Frequency Provider Last Rate Last Admin   acetaminophen (TYLENOL) tablet 650 mg  650 mg Oral Q6H PRN Regalado, Belkys A, MD   650 mg at 09/15/22 2258   Or   acetaminophen (TYLENOL) suppository 650 mg  650 mg Rectal Q6H PRN Regalado, Belkys A, MD       amLODipine (NORVASC) tablet 5 mg  5 mg Oral Daily Regalado, Belkys A, MD   5 mg at 09/22/22 1045   budesonide (PULMICORT) nebulizer solution 0.25 mg  0.25 mg Nebulization BID Regalado, Belkys A, MD   0.25 mg at 09/22/22 [phone]  enoxaparin (LOVENOX) injection 40 mg  40 mg Subcutaneous Q24H Regalado, Belkys A, MD   40 mg at 09/22/22 1045   hydrALAZINE (APRESOLINE) injection 5 mg  5 mg Intravenous Q6H PRN Regalado, Belkys A, MD   5 mg at 09/20/22 0920   ipratropium-albuterol (DUONEB) 0.5-2.5 (3) MG/3ML nebulizer solution 3 mL  3 mL Nebulization Q6H PRN Regalado, Belkys A, MD       ipratropium-albuterol (DUONEB) 0.5-2.5 (3) MG/3ML nebulizer solution 3 mL  3 mL  Nebulization BID Regalado, Belkys A, MD   3 mL at 09/22/22 0738   LORazepam (ATIVAN) injection 0.5 mg  0.5 mg Intravenous Q8H PRN Regalado, Belkys A, MD       Oral care mouth rinse  15 mL Mouth Rinse PRN Regalado, Belkys A, MD       polyethylene glycol (MIRALAX / GLYCOLAX) packet 17 g  17 g Oral Daily Regalado, Belkys A, MD       QUEtiapine (SEROQUEL) tablet 25 mg  25 mg Oral QHS Regalado, Belkys A, MD   25 mg at 09/21/22 2108     Discharge Medications: Please see discharge summary for a list of discharge medications.  Relevant Imaging Results:  Relevant Lab Results:   Additional Information SSN: [id number]  Ewing Schlein, LCSW

## 2022-09-22 NOTE — Plan of Care (Signed)

## 2022-09-22 NOTE — Progress Notes (Signed)
[facility] Liaison Note  Notified by [facility] manager of patient/family request for [facility] palliative services at home after discharge.   [facility] hospital liaison will follow patient for discharge disposition.   Please call with any hospice or outpatient palliative care related questions.   Thank you for the opportunity to participate in this patient's care.   Dionicio Stall, [facility] Liaison [phone]

## 2022-09-22 NOTE — Progress Notes (Signed)
Patient pulled IV out, and this nurse attempted x1 to insert a new one without success. Notified VAS/Iv who came and attempted without success. Made me aware of activities and that patient was candidate for PICC placement. Irean Hong NP aware of above activities and recommendation.

## 2022-09-22 NOTE — Discharge Summary (Addendum)
Physician Discharge Summary   Patient: Leslie Farley MRN: [id number] DOB: 10/30/1930  Admit date:     09/15/2022  Discharge date: 09/22/22  Discharge Physician: Alba Cory   PCP: Pcp, No   Recommendations at discharge:   Plan to discharge to SNF with palliative care follow up, if patient decline she will need Hospice care.  Patient needs to be fed.  Continue with Miralax- can use Suppository as needed to assist with Bowel function.   Discharge Diagnoses: Principal Problem:   Abdominal pain Active Problems:   COPD (chronic obstructive pulmonary disease) (HCC)   Constipation   Leukocytosis   AKI (acute kidney injury) (HCC)   Elevated transaminase level   Ileus (HCC)   Palliative care encounter   Goals of care, counseling/discussion  Resolved Problems:   * No resolved hospital problems. [facility] Course: 89 year old French-speaking female with medical history significant for  dementia, worse over last 6 months, CAD, asthma, COPD, GERD, hypertension, chronic anemia, history of cholecystectomy admitted early February this year for Esther coral colitis, severe constipation and fecal impaction presented to the ED from nursing home for evaluation of abdominal pain and constipation.   CT abdomen and pelvis show patchy airspace opacity in the lower lobe bilaterally new from previous exam and felt to be postinflammatory in nature.  Diverticulosis without diverticulitis, mild distention of the colon with air and fluid within no obstructive changes are seen.  No a small bowel obstruction is noted.   She develops worsening BL wheezing and dyspnea, and Hypoxia, Oxygen sat drop to 80 % on RA> she was transfer to step down for closer monitoring. Respiratory failure resolved. She has had BM. Poor oral intake.   Assessment and Plan:  1-Acute Hypoxic respiratory Failure.  Asthma/COPD -In setting on asthma vs hypoventilation from abdominal process.  -Continue with schedule  duo-Nebulizer. Pulmicort.  -ABG. No hypercapnia.  -Chest x ray showed hypoventilation.  -Continue to monitor. Incentive spirometry ordered.  On Room air, transfer to Med-surgery 7/28.   2-Abdominal pain, colonic ileum:  -Surgery Consulted, recommend support care, no surgical intervention.  -KUB; showed colonic distension.  -GI has been consulted.  -Continue to replete electrolytes.  -Last BM 7/25. -Received Enema 7/28--had Multiples BM.  -further advanced diet to soft  Continue with miralax, PRN suppository. She has BM 7/29   3-Dementia;  delirium.  Worse over the last 6 months per family. TSH:0.9, B:12: 771, thiamine: 135. Received  IV thiamine.  Ammonia normal.  No reversible cause identified. Suspect she has had delirium and underline worsening dementia.  She was more alert yesterday, she ate some provided by daughter. She is sleepy 7/29. Her MS fluctuates, suspect delirium.  Acute metabolic encephalopathy, ruled in setting of colonic ileum, poor oral intake, she was having AMS, lethargic at times.    Leukocytosis;  -CT abdomen pelvis negative. Patchy opacities lower lobes.  -Resolved.    4-AKI;  -Presents with cr 1.1 -Previous baseline 0.6--0.9 -Received IV fluids.  -Distended bladder on CT.  -Foley catheter placed.    5-Hypokalemia; Replaced.    Mild transaminases.  -Status postcholecystectomy.  CT abdomen showed no focal liver abnormality. -Follow trends.  Could be in the setting of dehydration   Hypertension:  -Continue to hold hydrochlorothiazide and losartan due to mild -AKI.  As needed hydralazine. -Continue with  Norvasc.                  Consultants: Palliative Procedures performed: None Disposition: Skilled nursing facility Diet recommendation:  Discharge Diet Orders (From admission, onward)     Start     Ordered   09/22/22 0000  Diet - low sodium heart healthy        09/22/22 1142           Cardiac diet DISCHARGE  MEDICATION: Allergies as of 09/22/2022       Reactions   Cucumber Extract         Medication List     STOP taking these medications    hydrochlorothiazide 12.5 MG capsule Commonly known as: MICROZIDE   losartan 100 MG tablet Commonly known as: COZAAR   simethicone 125 MG chewable tablet Commonly known as: MYLICON       TAKE these medications    acetaminophen 500 MG tablet Commonly known as: TYLENOL Take 2 tablets (1,000 mg total) by mouth every 6 (six) hours as needed for mild pain or moderate pain. What changed: Another medication with the same name was removed. Continue taking this medication, and follow the directions you see here.   Advair Diskus 100-50 MCG/ACT Aepb Generic drug: fluticasone-salmeterol Inhale 1 puff into the lungs every 12 (twelve) hours.   albuterol 108 (90 Base) MCG/ACT inhaler Commonly known as: VENTOLIN HFA Inhale 2 puffs into the lungs every 6 (six) hours as needed for wheezing or shortness of breath.   amLODipine 5 MG tablet Commonly known as: NORVASC Take 1 tablet (5 mg total) by mouth daily. Start taking on: September 23, 2022   bisacodyl 10 MG suppository Commonly known as: DULCOLAX Place 1 suppository (10 mg total) rectally daily as needed for moderate constipation.   CERTAVITE/ANTIOXIDANTS PO Take 1 tablet by mouth daily.   cycloSPORINE 0.05 % ophthalmic emulsion Commonly known as: RESTASIS Place 1 drop into both eyes 2 (two) times daily.   famotidine 20 MG tablet Commonly known as: PEPCID Take 1 tablet (20 mg total) by mouth daily.   FP FIBER LAXATIVE PO Take 500 mg by mouth 2 (two) times daily.   ipratropium-albuterol 0.5-2.5 (3) MG/3ML Soln Commonly known as: DUONEB Take 3 mLs by nebulization every 6 (six) hours as needed.   ipratropium-albuterol 0.5-2.5 (3) MG/3ML Soln Commonly known as: DUONEB Take 3 mLs by nebulization 2 (two) times daily.   melatonin 3 MG Tabs tablet Take 6 mg by mouth at bedtime.    nitroGLYCERIN 0.4 MG SL tablet Commonly known as: NITROSTAT Place 0.4 mg under the tongue every 5 (five) minutes as needed for chest pain (Do not exceed 3 doses per episode.).   polyethylene glycol 17 g packet Commonly known as: MIRALAX / GLYCOLAX Take 17 g by mouth daily.   QUEtiapine 25 MG tablet Commonly known as: SEROQUEL Take 1 tablet (25 mg total) by mouth at bedtime.   senna-docusate 8.6-50 MG tablet Commonly known as: Senokot-S Take 2 tablets by mouth at bedtime.   Systane Balance 0.6 % Soln Generic drug: Propylene Glycol Place 1 drop into both eyes in the morning and at bedtime.   tiZANidine 2 MG tablet Commonly known as: ZANAFLEX Take 1 tablet (2 mg total) by mouth at bedtime. After completing cipro   Vitamin D 50 MCG (2000 UT) tablet Take 2,000 Units by mouth daily.   Z-Bum 22 % Crea Generic drug: Zinc Oxide Apply 1 Application topically in the morning and at bedtime. Apply to inner thighs and perineal area twice a day.               Discharge Care Instructions  (From admission, onward)  Start     Ordered   09/22/22 0000  Discharge wound care:       Comments: See above   09/22/22 1142            Discharge Exam: Filed Weights   09/15/22 2233  Weight: 82.4 kg   General; ALert  Condition at discharge: poor  The results of significant diagnostics from this hospitalization (including imaging, microbiology, ancillary and laboratory) are listed below for reference.   Imaging Studies: DG Abd 2 Views  Result Date: 09/18/2022 CLINICAL DATA:  Abdominal distension. EXAM: ABDOMEN - 2 VIEW COMPARISON:  Radiographs 09/17/2022, 09/16/2022 and 09/15/2022. CT 09/15/2022. FINDINGS: Diffuse gaseous distention of the colon is again noted, slightly improved from the CT of 3 days ago. No significant changes seen from yesterday's radiographs. No bowel wall thickening, pneumatosis or pneumoperitoneum demonstrated. Possible small left pleural  effusion with mild left basilar atelectasis. Multilevel thoracolumbar spondylosis noted. IMPRESSION: Persistent diffuse gaseous distention of the colon, slightly improved from yesterday's radiographs, most consistent with colonic ileus based on prior CT. Electronically Signed   By: Carey Bullocks M.D.   On: 09/18/2022 12:33   DG Abd 2 Views  Result Date: 09/17/2022 CLINICAL DATA:  Abdominal distention EXAM: ABDOMEN - 2 VIEW COMPARISON:  Previous studies including the examination of 09/16/2022 FINDINGS: There is distended loop of colon in mid abdomen, possibly dilated sigmoid. There is interval decrease in amount of gas in rest of the colon. There is no significant small bowel dilation. Stomach is not distended. IMPRESSION: There is gaseous distention of sigmoid colon suggesting ileus or distal colonic obstruction. There is interval decrease in amount of gas in rest of the colon. There is no small bowel dilation. Electronically Signed   By: Ernie Avena M.D.   On: 09/17/2022 12:24   DG CHEST PORT 1 VIEW  Result Date: 09/16/2022 CLINICAL DATA:  Shortness of breath with hypoxia and wheezing. History of asthma. EXAM: PORTABLE CHEST 1 VIEW COMPARISON:  Radiographs 03/29/2022 and 10/30/2014. Chest CTA 10/30/2014. FINDINGS: 1031 hours. Mild patient rotation to the right and low lung volumes. The heart size and mediastinal contours are stable. Minimal opacity medially at the right lung base, likely atelectasis. No confluent airspace disease, edema, pleural effusion or pneumothorax. The bones appear unremarkable. IMPRESSION: Low lung volumes with mild right basilar atelectasis. No evidence of pneumonia or edema. Electronically Signed   By: Carey Bullocks M.D.   On: 09/16/2022 11:27   DG Abd 1 View  Result Date: 09/16/2022 CLINICAL DATA:  Constipation, asthma, hypertension EXAM: ABDOMEN - 1 VIEW COMPARISON:  CT abdomen/pelvis and KUB 1 day prior FINDINGS: Diffuse gaseous distention of the colon is again  seen, grossly similar to the prior CT. There is no definite free intraperitoneal air, within the confines of supine technique. Contrast is seen in the bladder from the prior CT. IMPRESSION: Diffuse gaseous distention of the colon is grossly similar to the prior CT. Electronically Signed   By: Lesia Hausen M.D.   On: 09/16/2022 11:24   CT ABDOMEN PELVIS W CONTRAST  Result Date: 09/15/2022 CLINICAL DATA:  Abdominal pain and swelling encounter EXAM: CT ABDOMEN AND PELVIS WITH CONTRAST TECHNIQUE: Multidetector CT imaging of the abdomen and pelvis was performed using the standard protocol following bolus administration of intravenous contrast. RADIATION DOSE REDUCTION: This exam was performed according to the departmental dose-optimization program which includes automated exposure control, adjustment of the mA and/or kV according to patient size and/or use of iterative reconstruction technique. CONTRAST:  OMNIPAQUE IOHEXOL 300 MG/ML  SOLN COMPARISON:  07/21/2022 FINDINGS: Lower chest: Patchy airspace opacities are noted in the lower lobes bilaterally likely postinflammatory in nature. These are new from prior exam. Hepatobiliary: No focal liver abnormality is seen. Status post cholecystectomy. No biliary dilatation. Pancreas: Unremarkable. No pancreatic ductal dilatation or surrounding inflammatory changes. Spleen: Normal in size without focal abnormality. Adrenals/Urinary Tract: Adrenal glands are within normal limits. Kidneys demonstrate a normal enhancement pattern bilaterally. No renal calculi or obstructive changes are seen. Normal excretion is noted on delayed images. The bladder is well distended. Stomach/Bowel: Scattered diverticular change of the colon is noted. No diverticulitis is seen. Distension of the colon is seen with both air and fluid particularly in the sigmoid although no findings to suggest volvulus are noted. The appendix is within normal limits. No obstructive or inflammatory changes of  the small bowel are seen. Stomach is within normal limits. Vascular/Lymphatic: Aortic atherosclerosis. No enlarged abdominal or pelvic lymph nodes. Reproductive: Uterus and bilateral adnexa are unremarkable. Other: No abdominal wall hernia or abnormality. No abdominopelvic ascites. Musculoskeletal: Degenerative changes of lumbar spine are noted. IMPRESSION: Patchy airspace opacities in the lower lobes bilaterally new from the prior exam and felt to be postinflammatory in nature. Diverticulosis without diverticulitis. Mild distension of the colon with air and fluid although no obstructive changes are seen. No small bowel obstruction is noted. Electronically Signed   By: Alcide Clever M.D.   On: 09/15/2022 20:04   DG Abd Portable 1 View  Result Date: 09/15/2022 CLINICAL DATA:  Constipation distension EXAM: PORTABLE ABDOMEN - 1 VIEW COMPARISON:  CT 07/21/2022 FINDINGS: Moderate air distension the colon with some air-filled small bowel. Gas down to the rectum. Mild stool in the upper quadrants. Numerous lucent centered benign appearing soft tissue calcifications IMPRESSION: Moderate air distension of colon with some air-filled small bowel, question ileus Electronically Signed   By: Jasmine Pang M.D.   On: 09/15/2022 17:14    Microbiology: Results for orders placed or performed during the hospital encounter of 09/15/22  MRSA Next Gen by PCR, Nasal     Status: Abnormal   Collection Time: 09/16/22  3:17 AM   Specimen: Nasal Mucosa; Nasal Swab  Result Value Ref Range Status   MRSA by PCR Next Gen DETECTED (A) NOT DETECTED Final    Comment: (NOTE) The GeneXpert MRSA Assay (FDA approved for NASAL specimens only), is one component of a comprehensive MRSA colonization surveillance program. It is not intended to diagnose MRSA infection nor to guide or monitor treatment for MRSA infections. Test performance is not FDA approved in patients less than 58 years old. Performed at [facility], [address] [place], [place] [address]     Labs: CBC: Recent Labs  Lab 09/15/22 1600 09/16/22 0612 09/17/22 0323 09/18/22 0329 09/20/22 0244 09/21/22 0430 09/22/22 0857  WBC 15.1*   < > 6.7 5.4 5.9 6.6 5.5  NEUTROABS 12.3*  --   --   --   --   --   --   HGB 11.4*   < > 10.5* 9.6* 10.1* 10.1* 10.9*  HCT 35.9*   < > 33.3* 30.2* 31.6* 32.6* 34.6*  MCV 98.9   < > 99.7 100.7* 99.7 101.6* 100.3*  PLT 219   < > 216 205 237 205 228   < > = values in this interval not displayed.   Basic Metabolic Panel: Recent Labs  Lab 09/16/22 1319 09/16/22 1923 09/17/22 6045 09/18/22 0329 09/19/22 4098  09/20/22 0244 09/21/22 0430 09/22/22 0857  NA  --    < > 143 143 143 143 145 142  K  --    < > 3.2* 3.2* 3.8 4.1 3.5 3.4*  CL  --    < > 109 111 114* 114* 115* 111  CO2  --    < > 27 25 24  21* 24 24  GLUCOSE  --    < > 106* 123* 112* 112* 88 93  BUN  --    < > 24* 18 17 14 14 11   CREATININE  --    < > 0.69 0.65 0.64 0.70 0.73 0.68  CALCIUM  --    < > 9.2 8.7* 8.5* 8.8* 8.8* 9.4  MG 2.3  --  2.5*  --  2.3 2.4  --   --    < > = values in this interval not displayed.   Liver Function Tests: Recent Labs  Lab 09/15/22 1600 09/16/22 0612 09/17/22 0323  AST 48* 33 25  ALT 55* 43 33  ALKPHOS 60 51 48  BILITOT 0.7 0.6 0.4  PROT 7.2 6.5 6.4*  ALBUMIN 3.5 3.2* 3.1*   CBG: Recent Labs  Lab 09/19/22 2356 09/20/22 0432 09/20/22 0815 09/20/22 1222 09/22/22 0918  GLUCAP 93 98 108* 99 83    Discharge time spent: greater than 30 minutes.  Signed: Alba Cory, MD [facility] 09/22/2022

## 2022-09-22 NOTE — TOC Transition Note (Signed)
Transition of Care [facility]) - CM/SW Discharge Note  Patient Details  Name: Leslie Farley MRN: [id number] Date of Birth: Jan 30, 1931  [facility] CM/SW Contact:  Ewing Schlein, LCSW Phone Number: 09/22/2022, 1:59 PM  Clinical Narrative: Patient will return to [facility]. CSW confirmed patient's return with [facility] in admissions. FL2 done. Discharge paperwork faxed to facility in hub. Daughters agreeable to [employer] providing palliative care at the facility. Referral made to [facility] with [facility]. Medical necessity form done; PTAR scheduled. Discharge packet completed. RN updated. TOC signing off.    Final next level of care: Long Term Nursing Home Barriers to Discharge: Barriers Resolved  Patient Goals and CMS Choice CMS Medicare.gov Compare Post Acute Care list provided to:: Patient Represenative (must comment) Choice offered to / list presented to : Adult Children  Discharge Placement       Patient chooses bed at: [facility] Patient to be transferred to facility by: PTAR Name of family member notified: Ed and [name] (daughters) Patient and family notified of of transfer: 09/22/22  Discharge Plan and Services Additional resources added to the After Visit Summary for       DME Arranged: N/A DME Agency: NA HH Arranged: NA HH Agency: NA  Social Determinants of Health (SDOH) Interventions SDOH Screenings   Food Insecurity: No Food Insecurity (09/15/2022)  Housing: Low Risk  (09/15/2022)  Transportation Needs: No Transportation Needs (09/15/2022)  Utilities: Not At Risk (09/15/2022)  Tobacco Use: Medium Risk (09/15/2022)   Readmission Risk Interventions    03/31/2022    1:08 PM  Readmission Risk Prevention Plan  Post Dischage Appt Complete  Medication Screening Complete  Transportation Screening Complete

## 2022-09-22 NOTE — Progress Notes (Signed)
Daily Progress Note   Patient Name: Leslie Farley       Date: 09/22/2022 DOB: 1930-08-17  Age: 89 y.o. MRN#: [id number] Attending Physician: Leslie Cory, MD Primary Care Physician: Pcp, No Admit Date: 09/15/2022 Length of Stay: 6 days  Reason for Consultation/Follow-up: Establishing goals of care  Subjective:   CC: Patient laying calming in bed. No family at bedside. Following up regarding complex medical decision making.   Subjective:  Reviewed EMR prior to presenting to bedside.    Presented to bedside to check on patient.  Able to discuss care with bedside RN who was present.  Patient able to eat applesauce this morning with assistance from nurse.  Patient more interactive today.  Patient lost IV access and IV team was unable to obtain another one. Patient was awake and appeared comfortable.  No family present at bedside when patient was seen.  Able to discuss care with IDT after visit.  Lan as per EMR review had been for patient to stabilize and return to her long-term care facility with outpatient palliative.  Patient can be transitioned over to hospice through outpatient palliative care group if needed.  As per EMR review patient's daughter works for [employer] or care hospice and so could assist with this transition.  Patient is a long-term care resident and may feel more comfortable in her "home" setting and have assistance with eating more there.  Review of Systems Appears comfortable Objective:   Vital Signs:  BP (!) 153/64 (BP Location: Right Arm)   Pulse 71   Temp 98.3 F (36.8 C) (Oral)   Resp 18   Wt 82.4 kg   SpO2 100%   BMI 30.23 kg/m   Physical Exam: General: NAD, awake, laying in bed Eyes: no drainage noted HENT: moist mucous membranes Cardiovascular: RRR, edema in Ue's  Respiratory: no increased work of breathing noted, not in respiratory distress Abdomen: obese Neuro: awake, calm  Imaging:  I personally reviewed recent imaging.    Assessment & Plan:   Assessment: Patient is an 89 year old female from [facility], has been living in long-term facilities for the last 7 years, past medical history of dementia worsening over the last 6 months, coronary artery disease, asthma, COPD, GERD, hypertension, chronic anemia, and history of cholecystectomy. Palliative medicine consult to assist with complex medical decision making.   Recommendations/Plan: # Complex medical decision making/goals of care:  - Patient unable to dissipate in complex medical decision-making due to her current medical status.  No family present at bedside during visit today.  -As per EMR review, hope was patient could stabilize and return to long-term care facility with outpatient palliative.  Should patient deteriorate, can transition to hospice from palliative care.  Patient appears to be at her baseline.  Discussed with IDT and patient may be returning to long-term care facility today where she is more familiar to her surroundings and can get more assistance with hands-on feeding.  -  Code Status: DNR  # Symptom management:  -As per hospitalist  # Psychosocial Support:  - 2 daughters  # Discharge Planning: Skilled Nursing Facility for long-term care with Palliative care service follow-up  Thank you for allowing the palliative care team to participate in the care Leslie Farley.  Alvester Morin, DO Palliative Care Provider PMT # [phone]  If patient remains symptomatic despite maximum doses, please call PMT at [phone] between 0700 and 1900. Outside of these hours, please call attending, as PMT does not have night coverage.  This  provider spent a total of 35 minutes providing patient's care.  Includes review of EMR, discussing care with other staff members involved in patient's medical care, obtaining relevant history and information from patient and/or patient's family, and personal review of imaging and lab work. Greater than 50%  of the time was spent counseling and coordinating care related to the above assessment and plan.   *Please note that this is a verbal dictation therefore any spelling or grammatical errors are due to the "Dragon [employer] One" system interpretation.

## 2022-09-22 NOTE — Progress Notes (Signed)
Report called to Amy RN at [facility]

## 2022-10-11 ENCOUNTER — Encounter (HOSPITAL_COMMUNITY): Payer: Self-pay

## 2022-10-11 ENCOUNTER — Inpatient Hospital Stay (HOSPITAL_COMMUNITY)
Admission: EM | Admit: 2022-10-11 | Discharge: 2022-10-19 | DRG: 689 | Disposition: A | Discharge status: Skilled Nursing Facility | Payer: Medicare Other | Source: Skilled Nursing Facility | Attending: Internal Medicine | Admitting: Internal Medicine

## 2022-10-11 ENCOUNTER — Inpatient Hospital Stay (HOSPITAL_COMMUNITY): Payer: Medicare Other

## 2022-10-11 ENCOUNTER — Other Ambulatory Visit: Payer: Self-pay

## 2022-10-11 DIAGNOSIS — L89151 Pressure ulcer of sacral region, stage 1: Secondary | ICD-10-CM | POA: Diagnosis present

## 2022-10-11 DIAGNOSIS — Z515 Encounter for palliative care: Secondary | ICD-10-CM

## 2022-10-11 DIAGNOSIS — Z6829 Body mass index (BMI) 29.0-29.9, adult: Secondary | ICD-10-CM | POA: Diagnosis not present

## 2022-10-11 DIAGNOSIS — E876 Hypokalemia: Secondary | ICD-10-CM | POA: Diagnosis present

## 2022-10-11 DIAGNOSIS — D649 Anemia, unspecified: Secondary | ICD-10-CM | POA: Diagnosis not present

## 2022-10-11 DIAGNOSIS — E860 Dehydration: Secondary | ICD-10-CM | POA: Diagnosis present

## 2022-10-11 DIAGNOSIS — K429 Umbilical hernia without obstruction or gangrene: Secondary | ICD-10-CM | POA: Diagnosis present

## 2022-10-11 DIAGNOSIS — R627 Adult failure to thrive: Secondary | ICD-10-CM

## 2022-10-11 DIAGNOSIS — R319 Hematuria, unspecified: Secondary | ICD-10-CM

## 2022-10-11 DIAGNOSIS — Z66 Do not resuscitate: Secondary | ICD-10-CM | POA: Diagnosis present

## 2022-10-11 DIAGNOSIS — K567 Ileus, unspecified: Secondary | ICD-10-CM | POA: Diagnosis present

## 2022-10-11 DIAGNOSIS — D638 Anemia in other chronic diseases classified elsewhere: Secondary | ICD-10-CM | POA: Diagnosis present

## 2022-10-11 DIAGNOSIS — Z9102 Food additives allergy status: Secondary | ICD-10-CM | POA: Diagnosis not present

## 2022-10-11 DIAGNOSIS — R630 Anorexia: Secondary | ICD-10-CM

## 2022-10-11 DIAGNOSIS — J410 Simple chronic bronchitis: Secondary | ICD-10-CM

## 2022-10-11 DIAGNOSIS — K219 Gastro-esophageal reflux disease without esophagitis: Secondary | ICD-10-CM | POA: Diagnosis present

## 2022-10-11 DIAGNOSIS — F0280 Dementia in other diseases classified elsewhere without behavioral disturbance: Secondary | ICD-10-CM | POA: Diagnosis present

## 2022-10-11 DIAGNOSIS — N390 Urinary tract infection, site not specified: Secondary | ICD-10-CM | POA: Diagnosis present

## 2022-10-11 DIAGNOSIS — R531 Weakness: Principal | ICD-10-CM

## 2022-10-11 DIAGNOSIS — R4182 Altered mental status, unspecified: Secondary | ICD-10-CM

## 2022-10-11 DIAGNOSIS — J4489 Other specified chronic obstructive pulmonary disease: Secondary | ICD-10-CM | POA: Diagnosis present

## 2022-10-11 DIAGNOSIS — Z87891 Personal history of nicotine dependence: Secondary | ICD-10-CM

## 2022-10-11 DIAGNOSIS — Z79899 Other long term (current) drug therapy: Secondary | ICD-10-CM | POA: Diagnosis not present

## 2022-10-11 DIAGNOSIS — G9341 Metabolic encephalopathy: Secondary | ICD-10-CM | POA: Diagnosis present

## 2022-10-11 DIAGNOSIS — F0390 Unspecified dementia without behavioral disturbance: Secondary | ICD-10-CM | POA: Diagnosis not present

## 2022-10-11 DIAGNOSIS — Z7951 Long term (current) use of inhaled steroids: Secondary | ICD-10-CM | POA: Diagnosis not present

## 2022-10-11 DIAGNOSIS — I10 Essential (primary) hypertension: Secondary | ICD-10-CM | POA: Diagnosis present

## 2022-10-11 DIAGNOSIS — Z9049 Acquired absence of other specified parts of digestive tract: Secondary | ICD-10-CM

## 2022-10-11 DIAGNOSIS — K5909 Other constipation: Secondary | ICD-10-CM | POA: Diagnosis present

## 2022-10-11 DIAGNOSIS — B961 Klebsiella pneumoniae [K. pneumoniae] as the cause of diseases classified elsewhere: Secondary | ICD-10-CM | POA: Diagnosis present

## 2022-10-11 DIAGNOSIS — R54 Age-related physical debility: Secondary | ICD-10-CM | POA: Diagnosis present

## 2022-10-11 DIAGNOSIS — J449 Chronic obstructive pulmonary disease, unspecified: Secondary | ICD-10-CM | POA: Diagnosis present

## 2022-10-11 LAB — URINALYSIS, ROUTINE W REFLEX MICROSCOPIC
Bilirubin Urine: NEGATIVE
Glucose, UA: NEGATIVE mg/dL
Hgb urine dipstick: NEGATIVE
Ketones, ur: NEGATIVE mg/dL
Nitrite: NEGATIVE
Protein, ur: 30 mg/dL — AB
Specific Gravity, Urine: 1.023 (ref 1.005–1.030)
WBC, UA: >50 WBC/hpf (ref 0–5)
pH: 5 (ref 5.0–8.0)

## 2022-10-11 LAB — COMPREHENSIVE METABOLIC PANEL
ALT: 12 U/L (ref 0–44)
AST: 18 U/L (ref 15–41)
Albumin: 2.7 g/dL — ABNORMAL LOW (ref 3.5–5.0)
Alkaline Phosphatase: 49 U/L (ref 38–126)
Anion gap: 11 (ref 5–15)
BUN: 14 mg/dL (ref 8–23)
CO2: 28 mmol/L (ref 22–32)
Calcium: 8.6 mg/dL — ABNORMAL LOW (ref 8.9–10.3)
Chloride: 104 mmol/L (ref 98–111)
Creatinine, Ser: 0.88 mg/dL (ref 0.44–1.00)
GFR, Estimated: >60 mL/min (ref >60)
Glucose, Bld: 99 mg/dL (ref 70–99)
Potassium: 2.9 mmol/L — ABNORMAL LOW (ref 3.5–5.1)
Sodium: 143 mmol/L (ref 135–145)
Total Bilirubin: 0.2 mg/dL — ABNORMAL LOW (ref 0.3–1.2)
Total Protein: 5.4 g/dL — ABNORMAL LOW (ref 6.5–8.1)

## 2022-10-11 LAB — CBG MONITORING, ED: Glucose-Capillary: 92 mg/dL (ref 70–99)

## 2022-10-11 LAB — CBC
HCT: 28.2 % — ABNORMAL LOW (ref 36.0–46.0)
Hemoglobin: 8.9 g/dL — ABNORMAL LOW (ref 12.0–15.0)
MCH: 31.1 pg (ref 26.0–34.0)
MCHC: 31.6 g/dL (ref 30.0–36.0)
MCV: 98.6 fL (ref 80.0–100.0)
Platelets: 212 10*3/uL (ref 150–400)
RBC: 2.86 MIL/uL — ABNORMAL LOW (ref 3.87–5.11)
RDW: 14.2 % (ref 11.5–15.5)
WBC: 4.7 10*3/uL (ref 4.0–10.5)
nRBC: 0 % (ref 0.0–0.2)

## 2022-10-11 LAB — PREALBUMIN: Prealbumin: 13 mg/dL — ABNORMAL LOW (ref 18–38)

## 2022-10-11 LAB — HEMOGLOBIN AND HEMATOCRIT, BLOOD
HCT: 29.6 % — ABNORMAL LOW (ref 36.0–46.0)
Hemoglobin: 9.3 g/dL — ABNORMAL LOW (ref 12.0–15.0)

## 2022-10-11 LAB — MAGNESIUM: Magnesium: 1.7 mg/dL (ref 1.7–2.4)

## 2022-10-11 MED ORDER — ACETAMINOPHEN 325 MG PO TABS: 650.0000 mg | ORAL_TABLET | Freq: Four times a day (QID) | ORAL | Status: DC | PRN

## 2022-10-11 MED ORDER — ALBUTEROL SULFATE (2.5 MG/3ML) 0.083% IN NEBU: 2.5000 mg | INHALATION_SOLUTION | Freq: Four times a day (QID) | RESPIRATORY_TRACT | Status: DC | PRN

## 2022-10-11 MED ORDER — SENNOSIDES-DOCUSATE SODIUM 8.6-50 MG PO TABS
2.0000 | ORAL_TABLET | Freq: Every day | ORAL | Status: DC
  Administered 2022-10-11 – 2022-10-18 (×5): 2 via ORAL
  Filled 2022-10-11 (×6): qty 2

## 2022-10-11 MED ORDER — QUETIAPINE FUMARATE 25 MG PO TABS
25.0000 mg | ORAL_TABLET | Freq: Every day | ORAL | Status: DC
  Administered 2022-10-11 – 2022-10-18 (×7): 25 mg via ORAL
  Filled 2022-10-11 (×7): qty 1

## 2022-10-11 MED ORDER — SODIUM CHLORIDE 0.9 % IV SOLN: INTRAVENOUS | Status: DC

## 2022-10-11 MED ORDER — POTASSIUM CHLORIDE 10 MEQ/100ML IV SOLN
10.0000 meq | INTRAVENOUS | Status: AC
  Administered 2022-10-11 (×2): 10 meq via INTRAVENOUS
  Filled 2022-10-11 (×2): qty 100

## 2022-10-11 MED ORDER — ONDANSETRON HCL 4 MG/2ML IJ SOLN: 4.0000 mg | Freq: Four times a day (QID) | INTRAMUSCULAR | Status: DC | PRN

## 2022-10-11 MED ORDER — ACETAMINOPHEN 650 MG RE SUPP: 650.0000 mg | Freq: Four times a day (QID) | RECTAL | Status: DC | PRN

## 2022-10-11 MED ORDER — IPRATROPIUM-ALBUTEROL 0.5-2.5 (3) MG/3ML IN SOLN
3.0000 mL | RESPIRATORY_TRACT | Status: DC | PRN
  Administered 2022-10-12: 3 mL via RESPIRATORY_TRACT
  Filled 2022-10-11: qty 3

## 2022-10-11 MED ORDER — SODIUM CHLORIDE 0.9% FLUSH
3.0000 mL | Freq: Two times a day (BID) | INTRAVENOUS | Status: DC
  Administered 2022-10-11 – 2022-10-19 (×11): 3 mL via INTRAVENOUS

## 2022-10-11 MED ORDER — POTASSIUM CHLORIDE CRYS ER 20 MEQ PO TBCR: 40.0000 meq | EXTENDED_RELEASE_TABLET | Freq: Once | ORAL | Status: DC

## 2022-10-11 MED ORDER — SODIUM CHLORIDE 0.9 % IV SOLN
1.0000 g | Freq: Once | INTRAVENOUS | Status: AC
  Administered 2022-10-11: 1 g via INTRAVENOUS
  Filled 2022-10-11: qty 10

## 2022-10-11 MED ORDER — SODIUM CHLORIDE 0.9 % IV SOLN
1.0000 g | INTRAVENOUS | Status: DC
  Administered 2022-10-12 – 2022-10-13 (×2): 1 g via INTRAVENOUS
  Filled 2022-10-11 (×2): qty 10

## 2022-10-11 MED ORDER — ENOXAPARIN SODIUM 40 MG/0.4ML IJ SOSY
40.0000 mg | PREFILLED_SYRINGE | INTRAMUSCULAR | Status: DC
  Administered 2022-10-11 – 2022-10-18 (×8): 40 mg via SUBCUTANEOUS
  Filled 2022-10-11 (×7): qty 0.4

## 2022-10-11 MED ORDER — LACTATED RINGERS IV BOLUS
1000.0000 mL | Freq: Once | INTRAVENOUS | Status: AC
  Administered 2022-10-11: 1000 mL via INTRAVENOUS

## 2022-10-11 MED ORDER — MOMETASONE FURO-FORMOTEROL FUM 100-5 MCG/ACT IN AERO
2.0000 | INHALATION_SPRAY | Freq: Two times a day (BID) | RESPIRATORY_TRACT | Status: DC
  Administered 2022-10-12 – 2022-10-19 (×10): 2 via RESPIRATORY_TRACT
  Filled 2022-10-11 (×2): qty 8.8

## 2022-10-11 MED ORDER — POTASSIUM CHLORIDE 20 MEQ PO PACK
40.0000 meq | PACK | Freq: Once | ORAL | Status: AC
  Administered 2022-10-11: 40 meq via ORAL
  Filled 2022-10-11: qty 2

## 2022-10-11 MED ORDER — ONDANSETRON HCL 4 MG PO TABS: 4.0000 mg | ORAL_TABLET | Freq: Four times a day (QID) | ORAL | Status: DC | PRN

## 2022-10-11 NOTE — ED Triage Notes (Signed)
Pt arrives via EMS from [facility]. Staff told ems that patient had some nausea and vomiting yesterday. Patient was last seen around 1930 yesterday. This morning patient was found only responsive to pain. Pt arrives only responsive to pain. VSS  EMS vitals  124/76 HR 74 SpO2 97% RA RR 15 CBG 22 T 97.7

## 2022-10-11 NOTE — ED Notes (Signed)
Phlebotomy to obtain labs. 

## 2022-10-11 NOTE — H&P (Signed)
History and Physical    Patient: Leslie Farley OZH:[id number] DOB: 07-26-1930 DOA: 10/11/2022 DOS: the patient was seen and examined on 10/11/2022 PCP: Pcp, No  Patient coming from: [facility] via EMS  Chief Complaint:  Chief Complaint  Patient presents with   Altered Mental Status   HPI: [name] is a 89 y.o. [place] speaking female with medical history significant of dementia hypertension, COPD/asthma, anemia, arthritis, GERD who presents after being noted to be acutely altered this morning.  She had just recently been admitted into the hospital from 7/23-7/30 with acute hypoxic respiratory failure with abdominal pain secondary to colonic ileus.  History is limited from the patient and her daughter over the phone okay.  It was reported that she has had decreased responsiveness  and didn't want to eat breakfast. Over the he last couple months she has had a progressive decline in her mental status.  She has been on a pured/liquid diet here lately due to not wanting to wear dentures.  In the emergency department patient was noted to be afebrile with blood pressure 123/50-163/65, and all other vital signs maintained.  Labs significant for hemoglobin 8.9 and potassium 2.9.  Urinalysis was significant for large leukocytes, many bacteria, and greater than 50 WBCs.  Patient was given 1 L of lactated Ringer's, Rocephin, and orders placed for potassium chloride 40 meq po with 20 meq IV.   Review of Systems: unable to review all systems due to the inability of the patient to answer questions. Past Medical History:  Diagnosis Date   Arthritis    Asthma    COPD (chronic obstructive pulmonary disease) (HCC)    GERD (gastroesophageal reflux disease)    Hypertension    Past Surgical History:  Procedure Laterality Date   CHOLECYSTECTOMY     Social History:  reports that she quit smoking about 51 years ago. She has never used smokeless tobacco. She reports that she does  not drink alcohol and does not use drugs.  Allergies  Allergen Reactions   Cucumber Extract     Family History  Problem Relation Age of Onset   Cancer - Other Mother        Died of throat cancer   Cancer - Prostate Father    Diabetes Brother     Prior to Admission medications   Medication Sig Start Date End Date Taking? Authorizing Provider  acetaminophen (TYLENOL) 500 MG tablet Take 2 tablets (1,000 mg total) by mouth every 6 (six) hours as needed for mild pain or moderate pain. 11/20/16   Elson Areas, PA-C  ADVAIR DISKUS 100-50 MCG/ACT AEPB Inhale 1 puff into the lungs every 12 (twelve) hours. 03/26/22   [provider]  albuterol (VENTOLIN HFA) 108 (90 Base) MCG/ACT inhaler Inhale 2 puffs into the lungs every 6 (six) hours as needed for wheezing or shortness of breath. Patient not taking: Reported on 09/17/2022 04/01/22   Joycelyn Das, MD  amLODipine (NORVASC) 5 MG tablet Take 1 tablet (5 mg total) by mouth daily. 09/23/22   Regalado, Belkys A, MD  bisacodyl (DULCOLAX) 10 MG suppository Place 1 suppository (10 mg total) rectally daily as needed for moderate constipation. 09/22/22   Regalado, Belkys A, MD  Cholecalciferol (VITAMIN D) 50 MCG (2000 UT) tablet Take 2,000 Units by mouth daily.    [provider]  cycloSPORINE (RESTASIS) 0.05 % ophthalmic emulsion Place 1 drop into both eyes 2 (two) times daily.    [provider]  famotidine (PEPCID)  20 MG tablet Take 1 tablet (20 mg total) by mouth daily. 04/02/22   Pokhrel, Rebekah Chesterfield, MD  ipratropium-albuterol (DUONEB) 0.5-2.5 (3) MG/3ML SOLN Take 3 mLs by nebulization every 6 (six) hours as needed. 09/22/22   Regalado, Belkys A, MD  ipratropium-albuterol (DUONEB) 0.5-2.5 (3) MG/3ML SOLN Take 3 mLs by nebulization 2 (two) times daily. 09/22/22   Regalado, Belkys A, MD  melatonin 3 MG TABS tablet Take 6 mg by mouth at bedtime.    [provider]  Multiple Vitamins-Minerals (CERTAVITE/ANTIOXIDANTS PO) Take 1  tablet by mouth daily.    [provider]  nitroGLYCERIN (NITROSTAT) 0.4 MG SL tablet Place 0.4 mg under the tongue every 5 (five) minutes as needed for chest pain (Do not exceed 3 doses per episode.). Patient not taking: Reported on 09/17/2022    [provider]  polyethylene glycol (MIRALAX / GLYCOLAX) packet Take 17 g by mouth daily.    [provider]  Propylene Glycol (SYSTANE BALANCE) 0.6 % SOLN Place 1 drop into both eyes in the morning and at bedtime.    [provider]  Psyllium (FP FIBER LAXATIVE PO) Take 500 mg by mouth 2 (two) times daily.    [provider]  QUEtiapine (SEROQUEL) 25 MG tablet Take 1 tablet (25 mg total) by mouth at bedtime. 09/22/22   Regalado, Belkys A, MD  senna-docusate (SENOKOT-S) 8.6-50 MG tablet Take 2 tablets by mouth at bedtime.    [provider]  tiZANidine (ZANAFLEX) 2 MG tablet Take 1 tablet (2 mg total) by mouth at bedtime. After completing cipro 04/06/22   Pokhrel, Rebekah Chesterfield, MD  Zinc Oxide (Z-BUM) 22 % CREA Apply 1 Application topically in the morning and at bedtime. Apply to inner thighs and perineal area twice a day.    [provider]    Physical Exam: Vitals:   10/11/22 1400 10/11/22 1430 10/11/22 1445 10/11/22 1457  BP: 123/64 (!) 163/65 (!) 140/97   Pulse: 67 73 71   Resp: 15 19 17    Temp:    97.6 F (36.4 C)  TempSrc:    Axillary  SpO2: 98% 99% 99%   Weight:      Height:        Constitutional: Lethargic elderly female currently in no acute distress but not really following commands  eyes: PERRL, lids and conjunctivae normal ENMT: Mucous membranes are dry.  Poor dentition. Neck: normal, supple  Respiratory: Normal respiratory effort with decreased overall aeration. Cardiovascular: Regular rate and rhythm, no murmurs / rubs / gallops. No extremity edema.   Abdomen: Abdomen is significantly distended, but no appreciated tenderness to palpation.  Bowel sounds were  decreased Musculoskeletal: no clubbing / cyanosis. No joint deformity upper and lower extremities. Good ROM, no contractures. Normal muscle tone.  Skin: no rashes, lesions, ulcers. No induration Neurologic: Appears able to move her extremities to noxious stimuli Psychiatric: Lethargic, unable to assess orientation.  Data Reviewed:   EKG reveals sinus rhythm 72 bpm.  Reviewed labs, imaging, and pertinent records as documented in this note.  Assessment and Plan:  Acute metabolic encephalopathy Patient was noted to be more lethargic refusing to eat and drink prior to arrival.  TSH noted to be within normal limits.  Suspect to be related with acute infection, dehydration(with reports of decreased p.o. intake), and/or worsening dementia. -Admit to a telemetry bed -Neurochecks -Check acute abdominal series  Urinary tract infection Acute. Urinalysis was significant for large leukocytes, many bacteria, and greater than 50 WBCs.  Urine  culture was obtained and patient started on empiric antibiotics of Rocephin.  Previous culture in May of this year was positive for E. coli resistant only to ciprofloxacin. -Follow-up urine culture -Continue empiric antibiotics of Rocephin and adjust as needed based off culture  Hypokalemia Acute.  Initial potassium noted to be 2.9.  Patient had been ordered potassium chloride 20 meq IV and 40 meq po. -Follow-up magnesium -Recheck BMP in a.m. and replace as needed  Normocytic anemia Hemoglobin 8.9 which is acutely 2 g lower than prior of 10.9 on 7/30. -Check stool guaiac -Check H&H  History of Colonic ileus Chronic constipation Patient had issues with colonic ileus last month when hospitalized which improved with stool softeners and enemas.  Patient was not to be a surgical candidate for any kind of intervention. -Monitor intake and output -Follow-up acute abdominal series -Continue stool softeners  Asthma/COPD No significant wheezing or rhonchi  appreciated at this time but decreased overall aeration. -Continue pharmacy substitution for IV -Duonebs as needed  Essential hypertension -Resume home blood pressure regimen once able  Dementia Daughter notes at baseline her mental status waxes and wanes.  She is usually alert and oriented to self. -Delirium precautions  Protein calorie malnutrition Albumin noted to be low at 2.7. -Check  prealbumin  Med reconciliation still pending.  DVT prophylaxis: Lovenox unless repeat H&H significantly lower. Advance Care Planning:   Code Status: DNR    Consults: None  Family Communication: Daughter updated over the phone.  Severity of Illness: The appropriate patient status for this patient is INPATIENT. Inpatient status is judged to be reasonable and necessary in order to provide the required intensity of service to ensure the patient's safety. The patient's presenting symptoms, physical exam findings, and initial radiographic and laboratory data in the context of their chronic comorbidities is felt to place them at high risk for further clinical deterioration. Furthermore, it is not anticipated that the patient will be medically stable for discharge from the hospital within 2 midnights of admission.   * I certify that at the point of admission it is my clinical judgment that the patient will require inpatient hospital care spanning beyond 2 midnights from the point of admission due to high intensity of service, high risk for further deterioration and high frequency of surveillance required.*  Author: Clydie Braun, MD 10/11/2022 3:35 PM  For on call review [email or web address].

## 2022-10-11 NOTE — ED Provider Notes (Addendum)
[facility] Provider Note   CSN: [id number] Arrival date & time: 10/11/22  1055     History  Chief Complaint  Patient presents with   Altered Mental Status    Blakeley Akram is a 89 y.o. female.  Pt with dementia from SNF reported to be less responsive.  Pt limited historian - level 5 caveat. Currently patients mental status appears somewhat similar to what is described during recent hospital admission, limited responsiveness that waxes/wanes.  No report of fevers. No report of trauma/fall. No report of vomiting/diarrhea or other specific symptoms.  Facility notes w decreased responsiveness, poor po intake, ?dehydration.   The history is provided by the patient, medical records, a relative and the EMS personnel. The history is limited by the condition of the patient.  Altered Mental Status      Home Medications Prior to Admission medications   Medication Sig Start Date End Date Taking? Authorizing Provider  acetaminophen (TYLENOL) 500 MG tablet Take 2 tablets (1,000 mg total) by mouth every 6 (six) hours as needed for mild pain or moderate pain. 11/20/16   Elson Areas, PA-C  ADVAIR DISKUS 100-50 MCG/ACT AEPB Inhale 1 puff into the lungs every 12 (twelve) hours. 03/26/22   [provider]  albuterol (VENTOLIN HFA) 108 (90 Base) MCG/ACT inhaler Inhale 2 puffs into the lungs every 6 (six) hours as needed for wheezing or shortness of breath. Patient not taking: Reported on 09/17/2022 04/01/22   Joycelyn Das, MD  amLODipine (NORVASC) 5 MG tablet Take 1 tablet (5 mg total) by mouth daily. 09/23/22   Regalado, Belkys A, MD  bisacodyl (DULCOLAX) 10 MG suppository Place 1 suppository (10 mg total) rectally daily as needed for moderate constipation. 09/22/22   Regalado, Belkys A, MD  Cholecalciferol (VITAMIN D) 50 MCG (2000 UT) tablet Take 2,000 Units by mouth daily.    [provider]  cycloSPORINE (RESTASIS) 0.05 % ophthalmic  emulsion Place 1 drop into both eyes 2 (two) times daily.    [provider]  famotidine (PEPCID) 20 MG tablet Take 1 tablet (20 mg total) by mouth daily. 04/02/22   Pokhrel, Rebekah Chesterfield, MD  ipratropium-albuterol (DUONEB) 0.5-2.5 (3) MG/3ML SOLN Take 3 mLs by nebulization every 6 (six) hours as needed. 09/22/22   Regalado, Belkys A, MD  ipratropium-albuterol (DUONEB) 0.5-2.5 (3) MG/3ML SOLN Take 3 mLs by nebulization 2 (two) times daily. 09/22/22   Regalado, Belkys A, MD  melatonin 3 MG TABS tablet Take 6 mg by mouth at bedtime.    [provider]  Multiple Vitamins-Minerals (CERTAVITE/ANTIOXIDANTS PO) Take 1 tablet by mouth daily.    [provider]  nitroGLYCERIN (NITROSTAT) 0.4 MG SL tablet Place 0.4 mg under the tongue every 5 (five) minutes as needed for chest pain (Do not exceed 3 doses per episode.). Patient not taking: Reported on 09/17/2022    [provider]  polyethylene glycol (MIRALAX / GLYCOLAX) packet Take 17 g by mouth daily.    [provider]  Propylene Glycol (SYSTANE BALANCE) 0.6 % SOLN Place 1 drop into both eyes in the morning and at bedtime.    [provider]  Psyllium (FP FIBER LAXATIVE PO) Take 500 mg by mouth 2 (two) times daily.    [provider]  QUEtiapine (SEROQUEL) 25 MG tablet Take 1 tablet (25 mg total) by mouth at bedtime. 09/22/22   Regalado, Belkys A, MD  senna-docusate (SENOKOT-S) 8.6-50 MG tablet Take 2 tablets by mouth at  bedtime.    [provider]  tiZANidine (ZANAFLEX) 2 MG tablet Take 1 tablet (2 mg total) by mouth at bedtime. After completing cipro 04/06/22   Pokhrel, Rebekah Chesterfield, MD  Zinc Oxide (Z-BUM) 22 % CREA Apply 1 Application topically in the morning and at bedtime. Apply to inner thighs and perineal area twice a day.    [provider]      Allergies    Cucumber extract    Review of Systems   Review of Systems  Unable to perform ROS: Dementia    Physical Exam Updated Vital  Signs BP (!) 140/97   Pulse 71   Temp 97.6 F (36.4 C) (Axillary)   Resp 17   Ht 1.651 m (5\' 5" )   Wt 81.6 kg   SpO2 99%   BMI 29.95 kg/m  Physical Exam Vitals and nursing note reviewed.  Constitutional:      Appearance: Normal appearance. She is well-developed.  HENT:     Head: Atraumatic.     Nose: Nose normal.     Mouth/Throat:     Mouth: Mucous membranes are moist.  Eyes:     General: No scleral icterus.    Conjunctiva/sclera: Conjunctivae normal.     Pupils: Pupils are equal, round, and reactive to light.  Neck:     Vascular: No carotid bruit.     Trachea: No tracheal deviation.     Comments: No stiffness or rigidity Cardiovascular:     Rate and Rhythm: Normal rate and regular rhythm.     Pulses: Normal pulses.     Heart sounds: Normal heart sounds. No murmur heard.    No friction rub. No gallop.  Pulmonary:     Effort: Pulmonary effort is normal. No respiratory distress.     Breath sounds: Normal breath sounds.  Abdominal:     General: There is no distension.     Palpations: Abdomen is soft.     Tenderness: There is no abdominal tenderness. There is no guarding.  Genitourinary:    Comments: No cva tenderness.  Musculoskeletal:        General: No swelling or tenderness.     Cervical back: Normal range of motion and neck supple. No rigidity. No muscular tenderness.  Skin:    General: Skin is warm and dry.     Findings: No rash.  Neurological:     Mental Status: She is alert.     Comments: Awake, opens eyes. Moves extremities purposefully.      ED Results / Procedures / Treatments   Labs (all labs ordered are listed, but only abnormal results are displayed) Results for orders placed or performed during the hospital encounter of 10/11/22  Comprehensive metabolic panel  Result Value Ref Range   Sodium 143 135 - 145 mmol/L   Potassium 2.9 (L) 3.5 - 5.1 mmol/L   Chloride 104 98 - 111 mmol/L   CO2 28 22 - 32 mmol/L   Glucose, Bld 99 70 - 99 mg/dL   BUN  14 8 - 23 mg/dL   Creatinine, Ser 9.56 0.44 - 1.00 mg/dL   Calcium 8.6 (L) 8.9 - 10.3 mg/dL   Total Protein 5.4 (L) 6.5 - 8.1 g/dL   Albumin 2.7 (L) 3.5 - 5.0 g/dL   AST 18 15 - 41 U/L   ALT 12 0 - 44 U/L   Alkaline Phosphatase 49 38 - 126 U/L   Total Bilirubin 0.2 (L) 0.3 - 1.2 mg/dL   GFR, Estimated >38 >75  mL/min   Anion gap 11 5 - 15  CBC  Result Value Ref Range   WBC 4.7 4.0 - 10.5 K/uL   RBC 2.86 (L) 3.87 - 5.11 MIL/uL   Hemoglobin 8.9 (L) 12.0 - 15.0 g/dL   HCT 65.7 (L) 84.6 - 96.2 %   MCV 98.6 80.0 - 100.0 fL   MCH 31.1 26.0 - 34.0 pg   MCHC 31.6 30.0 - 36.0 g/dL   RDW 95.2 84.1 - 32.4 %   Platelets 212 150 - 400 K/uL   nRBC 0.0 0.0 - 0.2 %  Urinalysis, Routine w reflex microscopic -Urine, Catheterized  Result Value Ref Range   Color, Urine YELLOW YELLOW   APPearance CLOUDY (A) CLEAR   Specific Gravity, Urine 1.023 1.005 - 1.030   pH 5.0 5.0 - 8.0   Glucose, UA NEGATIVE NEGATIVE mg/dL   Hgb urine dipstick NEGATIVE NEGATIVE   Bilirubin Urine NEGATIVE NEGATIVE   Ketones, ur NEGATIVE NEGATIVE mg/dL   Protein, ur 30 (A) NEGATIVE mg/dL   Nitrite NEGATIVE NEGATIVE   Leukocytes,Ua LARGE (A) NEGATIVE   RBC / HPF 0-5 0 - 5 RBC/hpf   WBC, UA >50 0 - 5 WBC/hpf   Bacteria, UA MANY (A) NONE SEEN   Squamous Epithelial / HPF 0-5 0 - 5 /HPF   Mucus PRESENT   CBG monitoring, ED  Result Value Ref Range   Glucose-Capillary 92 70 - 99 mg/dL   Comment 1 Notify RN    Comment 2 Document in Chart    DG Abd 2 Views  Result Date: 09/18/2022 CLINICAL DATA:  Abdominal distension. EXAM: ABDOMEN - 2 VIEW COMPARISON:  Radiographs 09/17/2022, 09/16/2022 and 09/15/2022. CT 09/15/2022. FINDINGS: Diffuse gaseous distention of the colon is again noted, slightly improved from the CT of 3 days ago. No significant changes seen from yesterday's radiographs. No bowel wall thickening, pneumatosis or pneumoperitoneum demonstrated. Possible small left pleural effusion with mild left basilar atelectasis.  Multilevel thoracolumbar spondylosis noted. IMPRESSION: Persistent diffuse gaseous distention of the colon, slightly improved from yesterday's radiographs, most consistent with colonic ileus based on prior CT. Electronically Signed   By: Carey Bullocks M.D.   On: 09/18/2022 12:33   DG Abd 2 Views  Result Date: 09/17/2022 CLINICAL DATA:  Abdominal distention EXAM: ABDOMEN - 2 VIEW COMPARISON:  Previous studies including the examination of 09/16/2022 FINDINGS: There is distended loop of colon in mid abdomen, possibly dilated sigmoid. There is interval decrease in amount of gas in rest of the colon. There is no significant small bowel dilation. Stomach is not distended. IMPRESSION: There is gaseous distention of sigmoid colon suggesting ileus or distal colonic obstruction. There is interval decrease in amount of gas in rest of the colon. There is no small bowel dilation. Electronically Signed   By: Ernie Avena M.D.   On: 09/17/2022 12:24   DG CHEST PORT 1 VIEW  Result Date: 09/16/2022 CLINICAL DATA:  Shortness of breath with hypoxia and wheezing. History of asthma. EXAM: PORTABLE CHEST 1 VIEW COMPARISON:  Radiographs 03/29/2022 and 10/30/2014. Chest CTA 10/30/2014. FINDINGS: 1031 hours. Mild patient rotation to the right and low lung volumes. The heart size and mediastinal contours are stable. Minimal opacity medially at the right lung base, likely atelectasis. No confluent airspace disease, edema, pleural effusion or pneumothorax. The bones appear unremarkable. IMPRESSION: Low lung volumes with mild right basilar atelectasis. No evidence of pneumonia or edema. Electronically Signed   By: Carey Bullocks M.D.   On: 09/16/2022 11:27  DG Abd 1 View  Result Date: 09/16/2022 CLINICAL DATA:  Constipation, asthma, hypertension EXAM: ABDOMEN - 1 VIEW COMPARISON:  CT abdomen/pelvis and KUB 1 day prior FINDINGS: Diffuse gaseous distention of the colon is again seen, grossly similar to the prior CT. There  is no definite free intraperitoneal air, within the confines of supine technique. Contrast is seen in the bladder from the prior CT. IMPRESSION: Diffuse gaseous distention of the colon is grossly similar to the prior CT. Electronically Signed   By: Lesia Hausen M.D.   On: 09/16/2022 11:24   CT ABDOMEN PELVIS W CONTRAST  Result Date: 09/15/2022 CLINICAL DATA:  Abdominal pain and swelling encounter EXAM: CT ABDOMEN AND PELVIS WITH CONTRAST TECHNIQUE: Multidetector CT imaging of the abdomen and pelvis was performed using the standard protocol following bolus administration of intravenous contrast. RADIATION DOSE REDUCTION: This exam was performed according to the departmental dose-optimization program which includes automated exposure control, adjustment of the mA and/or kV according to patient size and/or use of iterative reconstruction technique. CONTRAST:  OMNIPAQUE IOHEXOL 300 MG/ML  SOLN COMPARISON:  07/21/2022 FINDINGS: Lower chest: Patchy airspace opacities are noted in the lower lobes bilaterally likely postinflammatory in nature. These are new from prior exam. Hepatobiliary: No focal liver abnormality is seen. Status post cholecystectomy. No biliary dilatation. Pancreas: Unremarkable. No pancreatic ductal dilatation or surrounding inflammatory changes. Spleen: Normal in size without focal abnormality. Adrenals/Urinary Tract: Adrenal glands are within normal limits. Kidneys demonstrate a normal enhancement pattern bilaterally. No renal calculi or obstructive changes are seen. Normal excretion is noted on delayed images. The bladder is well distended. Stomach/Bowel: Scattered diverticular change of the colon is noted. No diverticulitis is seen. Distension of the colon is seen with both air and fluid particularly in the sigmoid although no findings to suggest volvulus are noted. The appendix is within normal limits. No obstructive or inflammatory changes of the small bowel are seen. Stomach is within  normal limits. Vascular/Lymphatic: Aortic atherosclerosis. No enlarged abdominal or pelvic lymph nodes. Reproductive: Uterus and bilateral adnexa are unremarkable. Other: No abdominal wall hernia or abnormality. No abdominopelvic ascites. Musculoskeletal: Degenerative changes of lumbar spine are noted. IMPRESSION: Patchy airspace opacities in the lower lobes bilaterally new from the prior exam and felt to be postinflammatory in nature. Diverticulosis without diverticulitis. Mild distension of the colon with air and fluid although no obstructive changes are seen. No small bowel obstruction is noted. Electronically Signed   By: Alcide Clever M.D.   On: 09/15/2022 20:04   DG Abd Portable 1 View  Result Date: 09/15/2022 CLINICAL DATA:  Constipation distension EXAM: PORTABLE ABDOMEN - 1 VIEW COMPARISON:  CT 07/21/2022 FINDINGS: Moderate air distension the colon with some air-filled small bowel. Gas down to the rectum. Mild stool in the upper quadrants. Numerous lucent centered benign appearing soft tissue calcifications IMPRESSION: Moderate air distension of colon with some air-filled small bowel, question ileus Electronically Signed   By: Jasmine Pang M.D.   On: 09/15/2022 17:14    EKG EKG Interpretation Date/Time:  Sunday October 11 2022 11:07:03 EDT Ventricular Rate:  72 PR Interval:  195 QRS Duration:  91 QT Interval:  386 QTC Calculation: 423 R Axis:   73  Text Interpretation: Sinus rhythm Baseline wander Confirmed by Cathren Laine ([id number]) on 10/11/2022 11:20:28 AM  Radiology No results found.  Procedures Procedures    Medications Ordered in ED Medications  cefTRIAXone (ROCEPHIN) 1 g in sodium chloride 0.9 % 100 mL IVPB (0 g Intravenous Stopped  10/11/22 1457)  potassium chloride (KLOR-CON) packet 40 mEq (40 mEq Oral Given 10/11/22 1402)    ED Course/ Medical Decision Making/ A&P                                 Medical Decision Making Problems Addressed: Acute alteration in mental  status: acute illness or injury with systemic symptoms that poses a threat to life or bodily functions Acute UTI: acute illness or injury with systemic symptoms that poses a threat to life or bodily functions Chronic dementia (HCC): chronic illness or injury with exacerbation, progression, or side effects of treatment that poses a threat to life or bodily functions Failure to thrive in adult: chronic illness or injury with exacerbation, progression, or side effects of treatment that poses a threat to life or bodily functions Generalized weakness: acute illness or injury with systemic symptoms Hypokalemia: acute illness or injury  Amount and/or Complexity of Data Reviewed Independent Historian:     Details: Ems/fam, hx External Data Reviewed: labs, radiology and notes. Labs: ordered. Decision-making details documented in ED Course. Radiology: independent interpretation performed. Decision-making details documented in ED Course. ECG/medicine tests: ordered and independent interpretation performed. Decision-making details documented in ED Course. Discussion of management or test interpretation with external provider(s): medicine  Risk Prescription drug management. Decision regarding hospitalization.   Iv ns. Continuous pulse ox and cardiac monitoring. Labs ordered/sent.  Differential diagnosis includes dementia, uti, encephalopathy, etc. Dispo decision including potential need for admission considered - will get labs and reassess.   Reviewed nursing notes and prior charts for additional history. External reports reviewed. Additional history from: EMS.   Cardiac monitor: sinus rhythm, rate 70.  Labs reviewed/interpreted by me - wbc normal.  K low. Kcl po.   Recent ct imaging reviewed/interpreted by me - no sbo.   Family on way to see - will discuss.  Additional labs reviewed/interpreted by me - UA c/w uti. Culture sent. Ivf and rocephin iv.   Given poor po intake, altered ms,  weakness, uti, will admit.  Hospitalists consulted for admission.  Discussed and confirmed patients dnr wishes with family/daughter. She indicates they did discuss on last admission and is 'dnr' status, no cpr, no chest compression, no intubation, but that patient would want medical therapy such as medications, iv fluids, etc.            Final Clinical Impression(s) / ED Diagnoses Final diagnoses:  None    Rx / DC Orders ED Discharge Orders     None          Cathren Laine, MD 10/11/22 1549

## 2022-10-12 DIAGNOSIS — G9341 Metabolic encephalopathy: Secondary | ICD-10-CM | POA: Diagnosis not present

## 2022-10-12 LAB — BASIC METABOLIC PANEL
Anion gap: 9 (ref 5–15)
BUN: 9 mg/dL (ref 8–23)
CO2: 28 mmol/L (ref 22–32)
Calcium: 8.6 mg/dL — ABNORMAL LOW (ref 8.9–10.3)
Chloride: 102 mmol/L (ref 98–111)
Creatinine, Ser: 0.89 mg/dL (ref 0.44–1.00)
GFR, Estimated: >60 mL/min (ref >60)
Glucose, Bld: 82 mg/dL (ref 70–99)
Potassium: 3.1 mmol/L — ABNORMAL LOW (ref 3.5–5.1)
Sodium: 139 mmol/L (ref 135–145)

## 2022-10-12 LAB — CBC
HCT: 28.5 % — ABNORMAL LOW (ref 36.0–46.0)
Hemoglobin: 9.1 g/dL — ABNORMAL LOW (ref 12.0–15.0)
MCH: 31.1 pg (ref 26.0–34.0)
MCHC: 31.9 g/dL (ref 30.0–36.0)
MCV: 97.3 fL (ref 80.0–100.0)
Platelets: 226 10*3/uL (ref 150–400)
RBC: 2.93 MIL/uL — ABNORMAL LOW (ref 3.87–5.11)
RDW: 14 % (ref 11.5–15.5)
WBC: 5 10*3/uL (ref 4.0–10.5)
nRBC: 0 % (ref 0.0–0.2)

## 2022-10-12 MED ORDER — FAMOTIDINE 20 MG PO TABS
20.0000 mg | ORAL_TABLET | Freq: Every day | ORAL | Status: DC
  Administered 2022-10-12 – 2022-10-19 (×6): 20 mg via ORAL
  Filled 2022-10-12 (×6): qty 1

## 2022-10-12 MED ORDER — IPRATROPIUM-ALBUTEROL 0.5-2.5 (3) MG/3ML IN SOLN
3.0000 mL | Freq: Two times a day (BID) | RESPIRATORY_TRACT | Status: DC
  Administered 2022-10-12 – 2022-10-19 (×13): 3 mL via RESPIRATORY_TRACT
  Filled 2022-10-12 (×14): qty 3

## 2022-10-12 MED ORDER — POLYVINYL ALCOHOL 1.4 % OP SOLN
1.0000 [drp] | Freq: Two times a day (BID) | OPHTHALMIC | Status: DC
  Administered 2022-10-12 – 2022-10-19 (×13): 1 [drp] via OPHTHALMIC
  Filled 2022-10-12: qty 15

## 2022-10-12 MED ORDER — POTASSIUM CHLORIDE 10 MEQ/100ML IV SOLN
10.0000 meq | INTRAVENOUS | Status: AC
  Administered 2022-10-12 (×4): 10 meq via INTRAVENOUS
  Filled 2022-10-12 (×4): qty 100

## 2022-10-12 MED ORDER — BISACODYL 10 MG RE SUPP: 10.0000 mg | Freq: Every day | RECTAL | Status: DC | PRN

## 2022-10-12 MED ORDER — POTASSIUM CHLORIDE IN NACL 20-0.9 MEQ/L-% IV SOLN
INTRAVENOUS | Status: DC
  Filled 2022-10-12 (×2): qty 1000

## 2022-10-12 MED ORDER — MELATONIN 3 MG PO TABS
6.0000 mg | ORAL_TABLET | Freq: Every day | ORAL | Status: DC
  Administered 2022-10-12 – 2022-10-18 (×6): 6 mg via ORAL
  Filled 2022-10-12 (×7): qty 2

## 2022-10-12 MED ORDER — HYDRALAZINE HCL 10 MG PO TABS: 10.0000 mg | ORAL_TABLET | Freq: Four times a day (QID) | ORAL | Status: DC | PRN

## 2022-10-12 MED ORDER — VITAMIN D 25 MCG (1000 UNIT) PO TABS
2000.0000 [IU] | ORAL_TABLET | Freq: Every day | ORAL | Status: DC
  Administered 2022-10-13 – 2022-10-19 (×5): 2000 [IU] via ORAL
  Filled 2022-10-12 (×5): qty 2

## 2022-10-12 MED ORDER — AMLODIPINE BESYLATE 5 MG PO TABS
5.0000 mg | ORAL_TABLET | Freq: Every day | ORAL | Status: DC
  Administered 2022-10-12 – 2022-10-13 (×2): 5 mg via ORAL
  Filled 2022-10-12 (×2): qty 1

## 2022-10-12 MED ORDER — POLYETHYLENE GLYCOL 3350 17 G PO PACK
17.0000 g | PACK | Freq: Every day | ORAL | Status: DC
  Administered 2022-10-12 – 2022-10-13 (×2): 17 g via ORAL
  Filled 2022-10-12 (×2): qty 1

## 2022-10-12 MED ORDER — CYCLOSPORINE 0.05 % OP EMUL
1.0000 [drp] | Freq: Two times a day (BID) | OPHTHALMIC | Status: DC
  Administered 2022-10-12 – 2022-10-19 (×11): 1 [drp] via OPHTHALMIC
  Filled 2022-10-12 (×15): qty 30

## 2022-10-12 NOTE — Hospital Course (Addendum)
89 y.o. [place] speaking female with dementia hypertension, COPD/asthma, anemia, arthritis, GERD who presents after being noted to be acutely altered 8/18 morning.  She had just recently been admitted into the hospital from 7/23-7/30 with acute hypoxic respiratory failure with abdominal pain secondary to colonic ileus. History is limited from the patient and her daughter over the phone.It was reported that she has had decreased responsiveness  and didn't want to eat breakfast. Over the he last couple months she has had a progressive decline in her mental status.  She has been on a pured/liquid diet here lately due to not wanting to wear dentures.  In the ED-noted to be afebrile with blood pressure 123/50-163/65, and all other vital signs maintained.  Labs significant for hemoglobin 8.9 and potassium 2.9.  Urinalysis was significant for large leukocytes, many bacteria, and greater than 50 WBCs.  Patient was given 1 L of lactated Ringer's, Rocephin, and admitted for further management

## 2022-10-12 NOTE — ED Notes (Signed)
Pt resting in bed with eyes closed, equal rise and fall noted of chest, NAD. Ongoing care.

## 2022-10-12 NOTE — Progress Notes (Signed)
PROGRESS NOTE Leslie Farley  XBJ:[id number] DOB: November 19, 1930 DOA: 10/11/2022 PCP: Pcp, No  Brief Narrative/Hospital Course: 89 y.o. [place] speaking female with dementia hypertension, COPD/asthma, anemia, arthritis, GERD who presents after being noted to be acutely altered 8/18 morning.  She had just recently been admitted into the hospital from 7/23-7/30 with acute hypoxic respiratory failure with abdominal pain secondary to colonic ileus. History is limited from the patient and her daughter over the phone.It was reported that she has had decreased responsiveness  and didn't want to eat breakfast. Over the he last couple months she has had a progressive decline in her mental status.  She has been on a pured/liquid diet here lately due to not wanting to wear dentures.  In the ED-noted to be afebrile with blood pressure 123/50-163/65, and all other vital signs maintained.  Labs significant for hemoglobin 8.9 and potassium 2.9.  Urinalysis was significant for large leukocytes, many bacteria, and greater than 50 WBCs.  Patient was given 1 L of lactated Ringer's, Rocephin, and admitted for further management     Subjective: Patient seen and examined this morning she is resting comfortably alert awake Follows minimal commands  Assessment and Plan: Principal Problem:   Acute metabolic encephalopathy Active Problems:   Urinary tract infection   Hypokalemia   Normocytic anemia   Chronic constipation   Ileus (HCC)   COPD (chronic obstructive pulmonary disease) (HCC)   Essential hypertension   Dementia (HCC)   Klebsiella UTI POA: Continue on ceftriaxone.  Supportive care, monitor vitals  Hypokalemia: Still low will replace IV AND W/ IVF  Acute metabolic encephalopathy Dementia: Patient was more confused in the setting of dementia likely in the setting due to UTI dehydration.  Continue supportive care fall precaution PT OT as able.  History of colonic ileus Chronic  constipation: Patient does have abnormal distention/protuberant abdomen centrally.  X-ray abdomen with multiple gas-filled loops of bowel throughout the abdomen and similar to previous question ileus versus constipation continue diet as tolerated, stool softener/Dulcolax PR  Normocytic anemia: Hemoglobin holding stable in 9 g range.  Monitor  Asthma/COPD Currently not wheezing, appears stable continue inhaler DuoNeb as needed    Essential hypertension: Currently well-controlled meds on hold.  Dietitian consult Nutrition Status:          DVT prophylaxis: enoxaparin (LOVENOX) injection 40 mg Start: 10/11/22 2200 Code Status:   Code Status: DNR Family Communication: plan of care discussed with patient/no family at bedside. Patient status is:  inpatient because of uti Level of care: Telemetry Medical   Dispo: The patient is from: snf            Anticipated disposition: snf once clinically better. Objective: Vitals last 24 hrs: Vitals:   10/12/22 0549 10/12/22 1000 10/12/22 1358 10/12/22 1400  BP: (!) 148/74 133/61  (!) 161/85  Pulse: 75 72 80 78  Resp: (!) 22 18 16 18   Temp: 98.5 F (36.9 C) 98.2 F (36.8 C) 98.4 F (36.9 C)   TempSrc: Oral Axillary Oral   SpO2: 99% 100% 100% 100%  Weight:      Height:       Weight change:   Physical Examination: General exam: alert awake, elderly frail, able to tell me her name HEENT:Oral mucosa moist, Ear/Nose WNL grossly Respiratory system: bilaterally clear BS, no use of accessory muscle Cardiovascular system: S1 & S2 +, No JVD. Gastrointestinal system: Abdomen soft,NT,ND, BS+ Nervous System:Alert, awake Extremities: LE edema neg,distal peripheral pulses palpable.  Skin: No rashes,no icterus.  MSK: Normal muscle bulk,tone, power  Medications reviewed:  Scheduled Meds:  enoxaparin (LOVENOX) injection  40 mg Subcutaneous Q24H   mometasone-formoterol  2 puff Inhalation BID   QUEtiapine  25 mg Oral QHS   senna-docusate  2  tablet Oral QHS   sodium chloride flush  3 mL Intravenous Q12H   Continuous Infusions:  0.9 % NaCl with KCl 20 mEq / L     cefTRIAXone (ROCEPHIN)  IV     potassium chloride     Diet Order             DIET - DYS 1 Room service appropriate? Yes; Fluid consistency: Thin  Diet effective now                   Intake/Output Summary (Last 24 hours) at 10/12/2022 1447 Last data filed at 10/12/2022 0507 Gross per 24 hour  Intake 699.72 ml  Output --  Net 699.72 ml   Net IO Since Admission: 699.72 mL [10/12/22 1447]  Wt Readings from Last 3 Encounters:  10/11/22 81.6 kg  09/15/22 82.4 kg  03/30/22 96.7 kg   Unresulted Labs (From admission, onward)     Start     Ordered   10/13/22 0500  Basic metabolic panel  Daily,   R      10/12/22 1333   10/13/22 0500  CBC  Daily,   R      10/12/22 1333   10/11/22 1558  Occult blood card to lab, stool RN will collect  ONCE - STAT,   STAT       Question:  Specimen to be collected by:  Answer:  RN will collect   10/11/22 1558          Data Reviewed: I have personally reviewed following labs and imaging studies CBC: Recent Labs  Lab 10/11/22 1113 10/11/22 1709 10/12/22 0559  WBC 4.7  --  5.0  HGB 8.9* 9.3* 9.1*  HCT 28.2* 29.6* 28.5*  MCV 98.6  --  97.3  PLT 212  --  226  Basic Metabolic Panel: Recent Labs  Lab 10/11/22 1113 10/11/22 1709 10/12/22 0559  NA 143  --  139  K 2.9*  --  3.1*  CL 104  --  102  CO2 28  --  28  GLUCOSE 99  --  82  BUN 14  --  9  CREATININE 0.88  --  0.89  CALCIUM 8.6*  --  8.6*  MG  --  1.7  --   GFR: Estimated Creatinine Clearance: 45.2 mL/min (by C-G formula based on SCr of 0.89 mg/dL). Liver Function Tests: Recent Labs  Lab 10/11/22 1113  AST 18  ALT 12  ALKPHOS 49  BILITOT 0.2*  PROT 5.4*  ALBUMIN 2.7*   Recent Labs  Lab 10/11/22 1058  GLUCAP 92   Recent Results (from the past 240 hour(s))  Urine Culture     Status: Abnormal (Preliminary result)   Collection Time: 10/11/22   1:45 PM   Specimen: Urine, Clean Catch  Result Value Ref Range Status   Specimen Description URINE, CLEAN CATCH  Final   Special Requests NONE  Final   Culture (A)  Final    >=100,000 COLONIES/mL KLEBSIELLA PNEUMONIAE SUSCEPTIBILITIES TO FOLLOW Performed at [facility], [address] [place], [place] [address]    Report Status PENDING  Incomplete    Antimicrobials: Anti-infectives (From admission, onward)    Start     Dose/Rate Route Frequency Ordered  Stop   10/12/22 1500  cefTRIAXone (ROCEPHIN) 1 g in sodium chloride 0.9 % 100 mL IVPB        1 g 200 mL/hr over 30 Minutes Intravenous Every 24 hours 10/11/22 1558     10/11/22 1345  cefTRIAXone (ROCEPHIN) 1 g in sodium chloride 0.9 % 100 mL IVPB        1 g 200 mL/hr over 30 Minutes Intravenous  Once 10/11/22 1344 10/11/22 1457      Culture/Microbiology    Component Value Date/Time   SDES URINE, CLEAN CATCH 10/11/2022 1345   SPECREQUEST NONE 10/11/2022 1345   CULT (A) 10/11/2022 1345    >=100,000 COLONIES/mL KLEBSIELLA PNEUMONIAE SUSCEPTIBILITIES TO FOLLOW Performed at [facility], [address], [place], [place] [address]    REPTSTATUS PENDING 10/11/2022 1345    Other culture-see note  Radiology Studies: DG ABD ACUTE 2+V W 1V CHEST  Result Date: 10/11/2022 CLINICAL DATA:  Abdominal distention, nausea and vomiting. EXAM: DG ABDOMEN ACUTE WITH 1 VIEW CHEST COMPARISON:  Abdominal radiograph dated 09/18/2022 and chest radiograph dated 09/16/2022. FINDINGS: Multiple gas-filled loops of bowel are seen throughout the abdomen. At least a few loops of colon appear distended in the lower midline abdomen, similar to 09/18/2022. No air-fluid levels or free intraperitoneal air are noted. No radiopaque calculi are seen. Heart size and mediastinal contours are within normal limits. Both lungs are clear. IMPRESSION: 1. Multiple gas-filled loops of bowel throughout the abdomen with at least a few loops of colon appearing  distended in the lower midline abdomen, similar to 09/18/2022. These findings likely reflect ileus. 2.  No acute pulmonary process. Electronically Signed   By: Romona Curls M.D.   On: 10/11/2022 18:19     LOS: 1 day   Lanae Boast, MD [facility]  10/12/2022, 2:47 PM

## 2022-10-12 NOTE — ED Notes (Signed)
ED TO INPATIENT HANDOFF REPORT  ED Nurse Name and Phone #: Leavy Cella Dajon Rowe [phone]  S Name/Age/Gender Leslie Farley 89 y.o. female Room/Bed: 044C/044C  Code Status   Code Status: DNR  Home/SNF/Other Skilled nursing facility Patient oriented to: disoriented x4 Is this baseline? Yes   Triage Complete: Triage complete  Chief Complaint Acute metabolic encephalopathy [G93.41]  Triage Note Pt arrives via EMS from [facility]. Staff told ems that patient had some nausea and vomiting yesterday. Patient was last seen around 1930 yesterday. This morning patient was found only responsive to pain. Pt arrives only responsive to pain. VSS  EMS vitals  124/76 HR 74 SpO2 97% RA RR 15 CBG 22 T 97.7   Allergies Allergies  Allergen Reactions   Cucumber Extract     Level of Care/Admitting Diagnosis ED Disposition     ED Disposition  Admit   Condition  --   Comment  Hospital Area: [facility] [[id number]]  Level of Care: Telemetry Medical [104]  May admit patient to [facility] or [facility] if equivalent level of care is available:: No  Covid Evaluation: Asymptomatic - no recent exposure (last 10 days) testing not required  Diagnosis: Acute metabolic encephalopathy [[id number]]  Admitting Physician: Clydie Braun [[id number]]  Attending Physician: Clydie Braun [[id number]]  Certification:: I certify this patient will need inpatient services for at least 2 midnights  Expected Medical Readiness: 10/13/2022          B Medical/Surgery History Past Medical History:  Diagnosis Date   Arthritis    Asthma    COPD (chronic obstructive pulmonary disease) (HCC)    GERD (gastroesophageal reflux disease)    Hypertension    Past Surgical History:  Procedure Laterality Date   CHOLECYSTECTOMY       A IV Location/Drains/Wounds Patient Lines/Drains/Airways Status     Active Line/Drains/Airways     Name Placement date Placement time Site Days    Peripheral IV 10/11/22 20 G Left Antecubital 10/11/22  1153  Antecubital  1   Peripheral IV 10/11/22 18 G Right Antecubital 10/11/22  1630  Antecubital  1   Pressure Injury 09/16/22 Coccyx Posterior;Mid Stage 1 -  Intact skin with non-blanchable redness of a localized area usually over a bony prominence. Ol wound healed, still not blachable 09/16/22  1240  -- 26            Intake/Output Last 24 hours  Intake/Output Summary (Last 24 hours) at 10/12/2022 1350 Last data filed at 10/12/2022 0507 Gross per 24 hour  Intake 699.72 ml  Output --  Net 699.72 ml    Labs/Imaging Results for orders placed or performed during the hospital encounter of 10/11/22 (from the past 48 hour(s))  CBG monitoring, ED     Status: None   Collection Time: 10/11/22 10:58 AM  Result Value Ref Range   Glucose-Capillary 92 70 - 99 mg/dL    Comment: Glucose reference range applies only to samples taken after fasting for at least 8 hours.   Comment 1 Notify RN    Comment 2 Document in Chart   Comprehensive metabolic panel     Status: Abnormal   Collection Time: 10/11/22 11:13 AM  Result Value Ref Range   Sodium 143 135 - 145 mmol/L   Potassium 2.9 (L) 3.5 - 5.1 mmol/L   Chloride 104 98 - 111 mmol/L   CO2 28 22 - 32 mmol/L   Glucose, Bld 99 70 - 99 mg/dL    Comment:  Glucose reference range applies only to samples taken after fasting for at least 8 hours.   BUN 14 8 - 23 mg/dL   Creatinine, Ser 4.69 0.44 - 1.00 mg/dL   Calcium 8.6 (L) 8.9 - 10.3 mg/dL   Total Protein 5.4 (L) 6.5 - 8.1 g/dL   Albumin 2.7 (L) 3.5 - 5.0 g/dL   AST 18 15 - 41 U/L   ALT 12 0 - 44 U/L   Alkaline Phosphatase 49 38 - 126 U/L   Total Bilirubin 0.2 (L) 0.3 - 1.2 mg/dL   GFR, Estimated >62 >95 mL/min    Comment: (NOTE) Calculated using the CKD-EPI Creatinine Equation (2021)    Anion gap 11 5 - 15    Comment: Performed at [facility] Lab, [address], [place], [place] [address]  CBC     Status: Abnormal   Collection Time:  10/11/22 11:13 AM  Result Value Ref Range   WBC 4.7 4.0 - 10.5 K/uL   RBC 2.86 (L) 3.87 - 5.11 MIL/uL   Hemoglobin 8.9 (L) 12.0 - 15.0 g/dL   HCT 24.4 (L) 01.0 - 27.2 %   MCV 98.6 80.0 - 100.0 fL   MCH 31.1 26.0 - 34.0 pg   MCHC 31.6 30.0 - 36.0 g/dL   RDW 53.6 64.4 - 03.4 %   Platelets 212 150 - 400 K/uL   nRBC 0.0 0.0 - 0.2 %    Comment: Performed at [facility] Lab, [address], [place], [place] [address]  Urinalysis, Routine w reflex microscopic -Urine, Catheterized     Status: Abnormal   Collection Time: 10/11/22 11:54 AM  Result Value Ref Range   Color, Urine YELLOW YELLOW   APPearance CLOUDY (A) CLEAR   Specific Gravity, Urine 1.023 1.005 - 1.030   pH 5.0 5.0 - 8.0   Glucose, UA NEGATIVE NEGATIVE mg/dL   Hgb urine dipstick NEGATIVE NEGATIVE   Bilirubin Urine NEGATIVE NEGATIVE   Ketones, ur NEGATIVE NEGATIVE mg/dL   Protein, ur 30 (A) NEGATIVE mg/dL   Nitrite NEGATIVE NEGATIVE   Leukocytes,Ua LARGE (A) NEGATIVE   RBC / HPF 0-5 0 - 5 RBC/hpf   WBC, UA >50 0 - 5 WBC/hpf   Bacteria, UA MANY (A) NONE SEEN   Squamous Epithelial / HPF 0-5 0 - 5 /HPF   Mucus PRESENT     Comment: Performed at [facility], [address], [place], [place] [address]  Urine Culture     Status: Abnormal (Preliminary result)   Collection Time: 10/11/22  1:45 PM   Specimen: Urine, Clean Catch  Result Value Ref Range   Specimen Description URINE, CLEAN CATCH    Special Requests NONE    Culture (A)     >=100,000 COLONIES/mL KLEBSIELLA PNEUMONIAE SUSCEPTIBILITIES TO FOLLOW Performed at [facility], [address] [place], [place] [address]    Report Status PENDING   Magnesium     Status: None   Collection Time: 10/11/22  5:09 PM  Result Value Ref Range   Magnesium 1.7 1.7 - 2.4 mg/dL    Comment: Performed at [facility] Lab, [address] [place], [place] [address]  Hemoglobin and hematocrit, blood     Status: Abnormal   Collection Time: 10/11/22  5:09 PM  Result  Value Ref Range   Hemoglobin 9.3 (L) 12.0 - 15.0 g/dL   HCT 84.1 (L) 66.0 - 63.0 %    Comment: Performed at [facility], [address] [place], [place] [address]  Prealbumin     Status: Abnormal   Collection Time: 10/11/22  5:09 PM  Result Value Ref Range   Prealbumin 13 (L) 18 - 38 mg/dL    Comment: Performed at [facility] Lab, [address], [place], [place] [address]  CBC     Status: Abnormal   Collection Time: 10/12/22  5:59 AM  Result Value Ref Range   WBC 5.0 4.0 - 10.5 K/uL   RBC 2.93 (L) 3.87 - 5.11 MIL/uL   Hemoglobin 9.1 (L) 12.0 - 15.0 g/dL   HCT 62.9 (L) 52.8 - 41.3 %   MCV 97.3 80.0 - 100.0 fL   MCH 31.1 26.0 - 34.0 pg   MCHC 31.9 30.0 - 36.0 g/dL   RDW 24.4 01.0 - 27.2 %   Platelets 226 150 - 400 K/uL   nRBC 0.0 0.0 - 0.2 %    Comment: Performed at [facility] Lab, [address] [place], [place] [address]  Basic metabolic panel     Status: Abnormal   Collection Time: 10/12/22  5:59 AM  Result Value Ref Range   Sodium 139 135 - 145 mmol/L   Potassium 3.1 (L) 3.5 - 5.1 mmol/L   Chloride 102 98 - 111 mmol/L   CO2 28 22 - 32 mmol/L   Glucose, Bld 82 70 - 99 mg/dL    Comment: Glucose reference range applies only to samples taken after fasting for at least 8 hours.   BUN 9 8 - 23 mg/dL   Creatinine, Ser 4.03 0.44 - 1.00 mg/dL   Calcium 8.6 (L) 8.9 - 10.3 mg/dL   GFR, Estimated >47 >42 mL/min    Comment: (NOTE) Calculated using the CKD-EPI Creatinine Equation (2021)    Anion gap 9 5 - 15    Comment: Performed at [facility], [address], [place], [place] [address]   DG ABD ACUTE 2+V W 1V CHEST  Result Date: 10/11/2022 CLINICAL DATA:  Abdominal distention, nausea and vomiting. EXAM: DG ABDOMEN ACUTE WITH 1 VIEW CHEST COMPARISON:  Abdominal radiograph dated 09/18/2022 and chest radiograph dated 09/16/2022. FINDINGS: Multiple gas-filled loops of bowel are seen throughout the abdomen. At least a few loops of colon appear distended in the  lower midline abdomen, similar to 09/18/2022. No air-fluid levels or free intraperitoneal air are noted. No radiopaque calculi are seen. Heart size and mediastinal contours are within normal limits. Both lungs are clear. IMPRESSION: 1. Multiple gas-filled loops of bowel throughout the abdomen with at least a few loops of colon appearing distended in the lower midline abdomen, similar to 09/18/2022. These findings likely reflect ileus. 2.  No acute pulmonary process. Electronically Signed   By: Romona Curls M.D.   On: 10/11/2022 18:19    Pending Labs Unresulted Labs (From admission, onward)     Start     Ordered   10/13/22 0500  Basic metabolic panel  Daily,   R      10/12/22 1333   10/13/22 0500  CBC  Daily,   R      10/12/22 1333   10/11/22 1558  Occult blood card to lab, stool RN will collect  ONCE - STAT,   STAT       Question:  Specimen to be collected by:  Answer:  RN will collect   10/11/22 1558            Vitals/Pain Today's Vitals   10/12/22 0545 10/12/22 0549  10/12/22 0736 10/12/22 1000  BP:  (!) 148/74  133/61  Pulse: 75 75  72  Resp: 20 (!) 22  18  Temp:  98.5 F (36.9 C)  98.2 F (36.8 C)  TempSrc:  Oral  Axillary  SpO2: 99% 99%  100%  Weight:      Height:      PainSc:   Asleep     Isolation Precautions No active isolations  Medications Medications  cefTRIAXone (ROCEPHIN) 1 g in sodium chloride 0.9 % 100 mL IVPB (has no administration in time range)  sodium chloride flush (NS) 0.9 % injection 3 mL (3 mLs Intravenous Not Given 10/12/22 1044)  acetaminophen (TYLENOL) tablet 650 mg (has no administration in time range)    Or  acetaminophen (TYLENOL) suppository 650 mg (has no administration in time range)  ondansetron (ZOFRAN) tablet 4 mg (has no administration in time range)    Or  ondansetron (ZOFRAN) injection 4 mg (has no administration in time range)  enoxaparin (LOVENOX) injection 40 mg (40 mg Subcutaneous Given 10/11/22 2130)  QUEtiapine (SEROQUEL)  tablet 25 mg (25 mg Oral Given 10/11/22 2130)  senna-docusate (Senokot-S) tablet 2 tablet (2 tablets Oral Given 10/11/22 2130)  mometasone-formoterol (DULERA) 100-5 MCG/ACT inhaler 2 puff (2 puffs Inhalation Not Given 10/12/22 0846)  ipratropium-albuterol (DUONEB) 0.5-2.5 (3) MG/3ML nebulizer solution 3 mL (3 mLs Nebulization Given 10/12/22 0830)  potassium chloride 10 mEq in 100 mL IVPB (has no administration in time range)  0.9 % NaCl with KCl 20 mEq/ L  infusion (has no administration in time range)  cefTRIAXone (ROCEPHIN) 1 g in sodium chloride 0.9 % 100 mL IVPB (0 g Intravenous Stopped 10/11/22 1457)  potassium chloride (KLOR-CON) packet 40 mEq (40 mEq Oral Given 10/11/22 1402)  potassium chloride 10 mEq in 100 mL IVPB (0 mEq Intravenous Stopped 10/11/22 1854)  lactated ringers bolus 1,000 mL (0 mLs Intravenous Stopped 10/11/22 1909)    Mobility non-ambulatory     Focused Assessments Cardiac Assessment Handoff:  Cardiac Rhythm: Normal sinus rhythm No results found for: "CKTOTAL", "CKMB", "CKMBINDEX", "TROPONINI" No results found for: "DDIMER" Does the Patient currently have chest pain? No    R Recommendations: See Admitting Provider Note  Report given to:   Additional Notes: pt speaks [place]

## 2022-10-13 ENCOUNTER — Inpatient Hospital Stay (HOSPITAL_COMMUNITY): Payer: Medicare Other

## 2022-10-13 DIAGNOSIS — G9341 Metabolic encephalopathy: Secondary | ICD-10-CM | POA: Diagnosis not present

## 2022-10-13 LAB — CBC
HCT: 30 % — ABNORMAL LOW (ref 36.0–46.0)
Hemoglobin: 9.6 g/dL — ABNORMAL LOW (ref 12.0–15.0)
MCH: 31.5 pg (ref 26.0–34.0)
MCHC: 32 g/dL (ref 30.0–36.0)
MCV: 98.4 fL (ref 80.0–100.0)
Platelets: 252 10*3/uL (ref 150–400)
RBC: 3.05 MIL/uL — ABNORMAL LOW (ref 3.87–5.11)
RDW: 13.9 % (ref 11.5–15.5)
WBC: 6.2 10*3/uL (ref 4.0–10.5)
nRBC: 0 % (ref 0.0–0.2)

## 2022-10-13 LAB — BASIC METABOLIC PANEL
Anion gap: 12 (ref 5–15)
BUN: 12 mg/dL (ref 8–23)
CO2: 28 mmol/L (ref 22–32)
Calcium: 9.3 mg/dL (ref 8.9–10.3)
Chloride: 100 mmol/L (ref 98–111)
Creatinine, Ser: 0.69 mg/dL (ref 0.44–1.00)
GFR, Estimated: >60 mL/min (ref >60)
Glucose, Bld: 119 mg/dL — ABNORMAL HIGH (ref 70–99)
Potassium: 3.4 mmol/L — ABNORMAL LOW (ref 3.5–5.1)
Sodium: 140 mmol/L (ref 135–145)

## 2022-10-13 LAB — URINE CULTURE: Culture: >=100000 — AB

## 2022-10-13 LAB — C-REACTIVE PROTEIN: CRP: 1.7 mg/dL — ABNORMAL HIGH (ref <1.0)

## 2022-10-13 LAB — PROCALCITONIN: Procalcitonin: <0.1 ng/mL

## 2022-10-13 LAB — BRAIN NATRIURETIC PEPTIDE: B Natriuretic Peptide: 110.8 pg/mL — ABNORMAL HIGH (ref 0.0–100.0)

## 2022-10-13 MED ORDER — AMLODIPINE BESYLATE 10 MG PO TABS
10.0000 mg | ORAL_TABLET | Freq: Every day | ORAL | Status: DC
  Administered 2022-10-16 – 2022-10-19 (×4): 10 mg via ORAL
  Filled 2022-10-13 (×4): qty 1

## 2022-10-13 MED ORDER — POTASSIUM CHLORIDE 10 MEQ/100ML IV SOLN
10.0000 meq | INTRAVENOUS | Status: AC
  Administered 2022-10-13 (×4): 10 meq via INTRAVENOUS
  Filled 2022-10-13 (×4): qty 100

## 2022-10-13 MED ORDER — LACTATED RINGERS IV SOLN: INTRAVENOUS | Status: AC

## 2022-10-13 MED ORDER — HYDRALAZINE HCL 20 MG/ML IJ SOLN
10.0000 mg | Freq: Four times a day (QID) | INTRAMUSCULAR | Status: DC | PRN
  Administered 2022-10-13: 10 mg via INTRAVENOUS
  Filled 2022-10-13: qty 1

## 2022-10-13 MED ORDER — HYDRALAZINE HCL 20 MG/ML IJ SOLN
10.0000 mg | Freq: Four times a day (QID) | INTRAMUSCULAR | Status: DC | PRN
  Administered 2022-10-17: 10 mg via INTRAVENOUS
  Filled 2022-10-13: qty 1

## 2022-10-13 MED ORDER — PANTOPRAZOLE SODIUM 40 MG PO TBEC
40.0000 mg | DELAYED_RELEASE_TABLET | Freq: Every day | ORAL | Status: DC
  Administered 2022-10-16 – 2022-10-19 (×4): 40 mg via ORAL
  Filled 2022-10-13 (×4): qty 1

## 2022-10-13 MED ORDER — POTASSIUM CHLORIDE CRYS ER 20 MEQ PO TBCR
40.0000 meq | EXTENDED_RELEASE_TABLET | Freq: Once | ORAL | Status: DC
  Administered 2022-10-13: 40 meq via ORAL
  Filled 2022-10-13: qty 2

## 2022-10-13 NOTE — Progress Notes (Signed)
SLP Cancellation Note  Patient Details Name: Leslie Farley MRN: [id number] DOB: 02/26/1930   Cancelled treatment:       Reason Eval/Treat Not Completed: Patient at procedure or test/unavailable.   Pt OTF for abdominal XR. Will hold pending results given concern for ileus.  Clyde Canterbury, M.S., CCC-SLP Speech-Language Pathologist Secure Chat Preferred  O: [phone]   Woodroe Chen 10/13/2022, 8:25 AM

## 2022-10-13 NOTE — Progress Notes (Signed)
   10/13/22 1143  TOC Brief Assessment  Insurance and Status Reviewed (Medicare A and B)  Patient has primary care physician Yes (There is none listed but most likely uses [employer])  Home environment has been reviewed From  LTC [facility]  Prior level of function: Dependent; wheelchair  Prior/Current Home Services Current home services [facility] for palliative services)  Social Determinants of Health Reivew SDOH reviewed no interventions necessary  Readmission risk has been reviewed Yes (26%)  Transition of care needs transition of care needs identified, TOC will continue to follow (Will need to be followed for return to LTC with palliative / hospice services vs comfort measures  Daughter has had conversation with Provider regarding status)

## 2022-10-13 NOTE — Progress Notes (Signed)
SLP Cancellation Note  Patient Details Name: Leslie Farley MRN: [id number] DOB: February 11, 1931   Cancelled treatment:       Reason Eval/Treat Not Completed: Medical issues which prohibited therapy. Pt now with pending STAT abdominal CT given concern for ileus. Will defer clinical swallowing evaluation at this time pending results of imaging and further medical work up.  Clyde Canterbury, M.S., CCC-SLP Speech-Language Pathologist Secure Chat Preferred  O: [phone]  Leslie Farley 10/13/2022, 9:35 AM

## 2022-10-13 NOTE — Progress Notes (Signed)
Patient's nurse reported that patient blood pressure is elevated 178/79 and heart rate 72.  Patient declining oral blood pressure regimen - Ordered hydralazine 10 mg every 6 as needed for systolic blood pressure more than 160 or diastolic blood pressure more than 110.   Tereasa Coop, MD [facility] 10/13/2022, 5:56 AM

## 2022-10-13 NOTE — Progress Notes (Signed)
PT Cancellation Note  Patient Details Name: Leslie Farley MRN: [id number] DOB: 1930/04/29   Cancelled Treatment:    Reason Eval/Treat Not Completed: Other (comment)  Daughter no longer present to interpret. Attempted to get phone interpreter with no one currently available. Spoke to daughter by phone and pt was able to assist with bed mobility and even sit EOB with assist. She was still standing, but in recent weeks had been unable to transfer to wheelchair. She was dependent for ADLs, including not able to feed herself due to severe arthritis bil hands. Daughter plans to return this p.m. but is not sure what time. Will try to see with daughter present.    Jerolyn Center, PT Acute Rehabilitation Services  Office [phone]   Zena Amos 10/13/2022, 1:14 PM

## 2022-10-13 NOTE — Progress Notes (Signed)
 [facility] Liaison Note  This patient is currently enrolled in [facility] outpatient-based palliative care.   [facility] will continue to follow for discharge disposition.   Please call for any outpatient based palliative care related questions or concerns.   Thank you.   Dionicio Stall, [facility] Liaison [phone]

## 2022-10-13 NOTE — Evaluation (Signed)
Physical Therapy Evaluation and Discharge Patient Details Name: Leslie Farley MRN: [id number] DOB: 1930/08/29 Today's Date: 10/13/2022  History of Present Illness  89 y.o. [place] speaking female who presents 10/11/22 from [facility] after being noted to be acutely altered. +UTI, hypokalemia, PMH significant of dementia hypertension, COPD/asthma, anemia, arthritis  Clinical Impression   Patient evaluated by Physical Therapy with no further acute PT needs identified. Spoke with daughter and pt is at her baseline with plan to return to LTC. No further PT needs.  PT is signing off. Thank you for this referral.         If plan is discharge home, recommend the following:     Can travel by private vehicle   No    Equipment Recommendations None recommended by PT  Recommendations for Other Services       Functional Status Assessment Patient has not had a recent decline in their functional status     Precautions / Restrictions Precautions Precautions: Fall      Mobility  Bed Mobility Overal bed mobility: Needs Assistance Bed Mobility: Rolling, Supine to Sit, Sit to Sidelying Rolling: Mod assist   Supine to sit: Max assist, HOB elevated   Sit to sidelying: Max assist General bed mobility comments: +2 total to scoot up in bed    Transfers                   General transfer comment: has not been doing at baseline    Ambulation/Gait                  Stairs            Wheelchair Mobility     Tilt Bed    Modified Rankin (Stroke Patients Only)       Balance Overall balance assessment: Needs assistance Sitting-balance support: No upper extremity supported, Feet unsupported Sitting balance-Leahy Scale: Poor Sitting balance - Comments: CGA for sitting x 4 miinutes                                     Pertinent Vitals/Pain Pain Assessment Pain Assessment: Faces Faces Pain Scale: Hurts even more Pain Location: hands  when touched, states "arthritis" Pain Intervention(s): Limited activity within patient's tolerance, Monitored during session, Repositioned    Home Living Family/patient expects to be discharged to:: Other (Comment)                   Additional Comments: return to LTC    Prior Function Prior Level of Function : Needs assist             Mobility Comments: per phone call with daughter: was able to help with rolling and sitting on EOB; had been able to get to w/c but not for several weeks ADLs Comments: dependent with ADLs     Extremity/Trunk Assessment   Upper Extremity Assessment Upper Extremity Assessment:  (bil shoulders very limited; hands painful with arthritis)    Lower Extremity Assessment Lower Extremity Assessment: Generalized weakness    Cervical / Trunk Assessment Cervical / Trunk Assessment: Other exceptions Cervical / Trunk Exceptions: overweight  Communication   Communication Communication: Other (comment) (speaks [place]; no telephone interpreter available (waited several minutes with no one picking up); used gestures for mobility with pt following) Cueing Techniques: Gestural cues;Visual cues  Cognition Arousal: Alert Behavior During Therapy: Restless Overall Cognitive Status: No family/caregiver present  to determine baseline cognitive functioning                                 General Comments: on arrival had purewick in her left hand and rt hand nearly had her gown off; very restless when positioning in bed        General Comments      Exercises     Assessment/Plan    PT Assessment Patient does not need any further PT services  PT Problem List         PT Treatment Interventions      PT Goals (Current goals can be found in the Care Plan section)  Acute Rehab PT Goals PT Goal Formulation: All assessment and education complete, DC therapy    Frequency       Co-evaluation               AM-PAC PT "6 Clicks"  Mobility  Outcome Measure Help needed turning from your back to your side while in a flat bed without using bedrails?: A Lot Help needed moving from lying on your back to sitting on the side of a flat bed without using bedrails?: Total Help needed moving to and from a bed to a chair (including a wheelchair)?: Total Help needed standing up from a chair using your arms (e.g., wheelchair or bedside chair)?: Total Help needed to walk in hospital room?: Total Help needed climbing 3-5 steps with a railing? : Total 6 Click Score: 7    End of Session   Activity Tolerance: Patient tolerated treatment well Patient left: in bed;with nursing/sitter in room (NT in to help reposition pt and bath her) Nurse Communication: Mobility status;Other (comment) (?needs restraints) PT Visit Diagnosis: Muscle weakness (generalized) (M62.81)    Time: 4098-1191 PT Time Calculation (min) (ACUTE ONLY): 30 min   Charges:   PT Evaluation $PT Eval Low Complexity: 1 Low PT Treatments $Therapeutic Activity: 8-22 mins PT General Charges $$ ACUTE PT VISIT: 1 Visit          Jerolyn Center, PT Acute Rehabilitation Services  Office [phone]   Zena Amos 10/13/2022, 3:28 PM

## 2022-10-13 NOTE — Progress Notes (Signed)
PT Cancellation Note  Patient Details Name: Leslie Farley MRN: [id number] DOB: 1930/04/28   Cancelled Treatment:    Reason Eval/Treat Not Completed: Patient at procedure or test/unavailable  Patient/family with MD and RN. Will return   Jerolyn Center, PT Acute Rehabilitation Services  Office [phone]   Zena Amos 10/13/2022, 9:13 AM

## 2022-10-13 NOTE — Progress Notes (Signed)
PROGRESS NOTE                                                                                                                                                                                                             Patient Demographics:    Leslie Farley, is a 89 y.o. female, DOB - April 08, 1930, UXL:[id number]  Outpatient Primary MD for the patient is Pcp, No    LOS - 2  Admit date - 10/11/2022    Chief Complaint  Patient presents with   Altered Mental Status       Brief Narrative (HPI from H&P)   88 y.o. [place] speaking female with dementia hypertension, COPD/asthma, anemia, arthritis, GERD who presents after being noted to be acutely altered 8/18 morning.  She had just recently been admitted into the hospital from 7/23-7/30 with acute hypoxic respiratory failure with abdominal pain secondary to colonic ileus. History is limited from the patient and her daughter over the phone.It was reported that she has had decreased responsiveness  and didn't want to eat breakfast. Over the he last couple months she has had a progressive decline in her mental status.  The ER workup suggestive of UTI along with possible reoccurrence of ileus and she was admitted to the hospital.   Subjective:    Leslie Farley today has, No headache, No chest pain, No abdominal pain - No Nausea, No new weakness tingling or numbness, No SOB.   Assessment  & Plan :    Acute metabolic encephalopathy in a patient with underlying dementia due to Klebsiella UTI present on admission. She has been placed on appropriate IV Rocephin along with IV fluids for hydration, has poor quality of life at baseline with advanced dementia, no headache or focal deficits, continue supportive care, PT OT and speech to see.  Had detailed discussion with patient's daughter bedside.  If significant decline then they will pursue comfort measures.  No heroics or  surgeries.  Recent history of colonic ileus with likely reoccurrence.  Abdomen is still distended, patient due to dementia unable to provide reliable history, check CT abdomen pelvis, for now n.p.o. except medications along with IV fluids for hydration.  Advance underlying dementia.  Supportive care.  At risk for delirium.  Minimize narcotics and benzodiazepines.  Anemia of chronic disease.  Stable.  Hypokalemia.  Replaced.  HX  of asthma and COPD.  Does have minimal wheezing, nebulizer treatments added, supplemental oxygen as needed.  Monitor for fluid overload.  Hypertension.  On Norvasc, add as needed hydralazine.       Condition - Extremely Guarded  Family Communication  :  daughter bedisde 10/13/22, confirmed DNR, if declines comfort measures.  Code Status :  DNR  Consults  :    PUD Prophylaxis :  PPI   Procedures  :     CT Abd Pelvis -       Disposition Plan  :    Status is: Inpatient  DVT Prophylaxis  :    enoxaparin (LOVENOX) injection 40 mg Start: 10/11/22 2200    Lab Results  Component Value Date   PLT 252 10/13/2022    Diet :  Diet Order             Diet NPO time specified Except for: Sips with Meds  Diet effective now                    Inpatient Medications  Scheduled Meds:  amLODipine  5 mg Oral Daily   cholecalciferol  2,000 Units Oral Daily   cycloSPORINE  1 drop Both Eyes BID   enoxaparin (LOVENOX) injection  40 mg Subcutaneous Q24H   famotidine  20 mg Oral Daily   ipratropium-albuterol  3 mL Nebulization BID   melatonin  6 mg Oral QHS   mometasone-formoterol  2 puff Inhalation BID   polyethylene glycol  17 g Oral Daily   polyvinyl alcohol  1 drop Both Eyes BID   QUEtiapine  25 mg Oral QHS   senna-docusate  2 tablet Oral QHS   sodium chloride flush  3 mL Intravenous Q12H   Continuous Infusions:  cefTRIAXone (ROCEPHIN)  IV 1 g (10/12/22 1627)   lactated ringers 75 mL/hr at 10/13/22 0919   potassium chloride 10 mEq (10/13/22  0903)   PRN Meds:.acetaminophen **OR** acetaminophen, bisacodyl, hydrALAZINE, ipratropium-albuterol, ondansetron **OR** ondansetron (ZOFRAN) IV  Antibiotics  :    Anti-infectives (From admission, onward)    Start     Dose/Rate Route Frequency Ordered Stop   10/12/22 1500  cefTRIAXone (ROCEPHIN) 1 g in sodium chloride 0.9 % 100 mL IVPB        1 g 200 mL/hr over 30 Minutes Intravenous Every 24 hours 10/11/22 1558     10/11/22 1345  cefTRIAXone (ROCEPHIN) 1 g in sodium chloride 0.9 % 100 mL IVPB        1 g 200 mL/hr over 30 Minutes Intravenous  Once 10/11/22 1344 10/11/22 1457         Objective:   Vitals:   10/13/22 0400 10/13/22 0610 10/13/22 0705 10/13/22 0922  BP: (!) 178/72 (!) 164/71 (!) 158/81   Pulse: 79 84 94 88  Resp: 20   (!) 24  Temp: 98.4 F (36.9 C)     TempSrc: Oral     SpO2:  100%  98%  Weight:      Height:        Wt Readings from Last 3 Encounters:  10/11/22 81.6 kg  09/15/22 82.4 kg  03/30/22 96.7 kg    No intake or output data in the 24 hours ending 10/13/22 0948   Physical Exam  Awake but confused, no focal deficits [place].AT,PERRAL Supple Neck, No JVD,   Symmetrical Chest wall movement, Good air movement bilaterally, CTAB RRR,No Gallops,Rubs or new Murmurs,  +ve B.Sounds, abdomen is distended question underlying periumbilical  hernia No Cyanosis, Clubbing or edema     RN pressure injury documentation: Pressure Injury 09/16/22 Coccyx Posterior;Mid Stage 1 -  Intact skin with non-blanchable redness of a localized area usually over a bony prominence. Ol wound healed, still not blachable (Active)  09/16/22 1240  Location: Coccyx  Location Orientation: Posterior;Mid  Staging: Stage 1 -  Intact skin with non-blanchable redness of a localized area usually over a bony prominence.  Wound Description (Comments): Ol wound healed, still not blachable  Present on Admission: Yes  Dressing Type None 10/12/22 2000      Data Review:    Recent Labs  Lab  10/11/22 1113 10/11/22 1709 10/12/22 0559 10/13/22 0013  WBC 4.7  --  5.0 6.2  HGB 8.9* 9.3* 9.1* 9.6*  HCT 28.2* 29.6* 28.5* 30.0*  PLT 212  --  226 252  MCV 98.6  --  97.3 98.4  MCH 31.1  --  31.1 31.5  MCHC 31.6  --  31.9 32.0  RDW 14.2  --  14.0 13.9    Recent Labs  Lab 10/11/22 1113 10/11/22 1123 10/11/22 1709 10/12/22 0559 10/13/22 0013  NA 143  --   --  139 140  K 2.9*  --   --  3.1* 3.4*  CL 104  --   --  102 100  CO2 28  --   --  28 28  ANIONGAP 11  --   --  9 12  GLUCOSE 99  --   --  82 119*  BUN 14  --   --  9 12  CREATININE 0.88  --   --  0.89 0.69  AST 18  --   --   --   --   ALT 12  --   --   --   --   ALKPHOS 49  --   --   --   --   BILITOT 0.2*  --   --   --   --   ALBUMIN 2.7*  --   --   --   --   CRP  --  1.7*  --   --   --   PROCALCITON  --  <0.10  --   --   --   BNP  --   --   --   --  110.8*  MG  --   --  1.7  --   --   CALCIUM 8.6*  --   --  8.6* 9.3      Recent Labs  Lab 10/11/22 1113 10/11/22 1123 10/11/22 1709 10/12/22 0559 10/13/22 0013  CRP  --  1.7*  --   --   --   PROCALCITON  --  <0.10  --   --   --   BNP  --   --   --   --  110.8*  MG  --   --  1.7  --   --   CALCIUM 8.6*  --   --  8.6* 9.3    Micro Results Recent Results (from the past 240 hour(s))  Urine Culture     Status: Abnormal   Collection Time: 10/11/22  1:45 PM   Specimen: Urine, Clean Catch  Result Value Ref Range Status   Specimen Description URINE, CLEAN CATCH  Final   Special Requests   Final    NONE Performed at [facility], [address], [place], [place] [address]    Culture >=100,000 COLONIES/mL KLEBSIELLA  PNEUMONIAE (A)  Final   Report Status 10/13/2022 FINAL  Final   Organism ID, Bacteria KLEBSIELLA PNEUMONIAE (A)  Final      Susceptibility   Klebsiella pneumoniae - MIC*    AMPICILLIN RESISTANT Resistant     CEFAZOLIN <=4 SENSITIVE Sensitive     CEFEPIME <=0.12 SENSITIVE Sensitive     CEFTRIAXONE <=0.25 SENSITIVE Sensitive      CIPROFLOXACIN <=0.25 SENSITIVE Sensitive     GENTAMICIN <=1 SENSITIVE Sensitive     IMIPENEM <=0.25 SENSITIVE Sensitive     NITROFURANTOIN 64 INTERMEDIATE Intermediate     TRIMETH/SULFA <=20 SENSITIVE Sensitive     AMPICILLIN/SULBACTAM 4 SENSITIVE Sensitive     PIP/TAZO <=4 SENSITIVE Sensitive     * >=100,000 COLONIES/mL KLEBSIELLA PNEUMONIAE    Radiology Reports DG Abd 1 View  Result Date: 10/13/2022 CLINICAL DATA:  89 year old female with ileus. EXAM: ABDOMEN - 1 VIEW COMPARISON:  Abdominal radiograph 10/11/2022. FINDINGS: Multiple prominent gas-filled loops of small bowel and colon are again noted. Gaseous distention of the sigmoid colon measuring up to 10 cm in diameter. No pneumoperitoneum. IMPRESSION: 1. Nonspecific bowel gas pattern, which may again suggest an ileus, very similar to the prior examination. Electronically Signed   By: Trudie Reed M.D.   On: 10/13/2022 09:00   DG Chest Port 1 View  Result Date: 10/13/2022 CLINICAL DATA:  [id number] with shortness of breath, wheezing and COPD. EXAM: PORTABLE CHEST 1 VIEW COMPARISON:  Portable chest 10/11/2022 at 5:16 p.m. FINDINGS: 6:26 a.m. The lungs are clear. No pleural effusion is seen. The cardiomediastinal silhouette and vascular pattern are normal. There is calcific plaque in the aortic arch. Advanced thoracic spondylosis and advanced right shoulder DJD. IMPRESSION: No evidence of acute chest disease.  Aortic atherosclerosis. Electronically Signed   By: Almira Bar M.D.   On: 10/13/2022 06:57   DG ABD ACUTE 2+V W 1V CHEST  Result Date: 10/11/2022 CLINICAL DATA:  Abdominal distention, nausea and vomiting. EXAM: DG ABDOMEN ACUTE WITH 1 VIEW CHEST COMPARISON:  Abdominal radiograph dated 09/18/2022 and chest radiograph dated 09/16/2022. FINDINGS: Multiple gas-filled loops of bowel are seen throughout the abdomen. At least a few loops of colon appear distended in the lower midline abdomen, similar to 09/18/2022. No air-fluid levels or  free intraperitoneal air are noted. No radiopaque calculi are seen. Heart size and mediastinal contours are within normal limits. Both lungs are clear. IMPRESSION: 1. Multiple gas-filled loops of bowel throughout the abdomen with at least a few loops of colon appearing distended in the lower midline abdomen, similar to 09/18/2022. These findings likely reflect ileus. 2.  No acute pulmonary process. Electronically Signed   By: Romona Curls M.D.   On: 10/11/2022 18:19      Signature  -   Susa Raring M.D on 10/13/2022 at 9:48 AM   -  To page go to [email or web address]

## 2022-10-13 NOTE — Progress Notes (Signed)
Initial Nutrition Assessment  DOCUMENTATION CODES:   Not applicable  INTERVENTION:  Monitor for diet advancement Recommend obtaining a new weight  NUTRITION DIAGNOSIS:   Inadequate oral intake related to inability to eat as evidenced by NPO status.  GOAL:   Patient will meet greater than or equal to 90% of their needs  MONITOR:   Diet advancement, Weight trends, Labs, I & O's  REASON FOR ASSESSMENT:   Consult Assessment of nutrition requirement/status  ASSESSMENT:   89 y.o. female presented with altered mental status. PMH includes COPD, GERD, CAD, and HTN. Pt admitted with acute metabolic encephalopathy, UTI, and hypokalemia.   Pt laying in bed, no family at bedside. Unable to provide any nutrition information due to language barrier, daughter has been translating. Pending work-up for possible ileus. Spoke with RN, reports that pt ate well yesterday with no nausea or vomiting. Had a BM yesterday. Per weight history, current weight appears to be stated versus actual measured weight.   Medications reviewed and include: Vitamin D3, Pepcid, Melatonin, Miralax, Senokot-S, IV antibiotics  Labs reviewed: Sodium 140, Potassium 3.4, BUN 12, Creatinine 0.69   NUTRITION - FOCUSED PHYSICAL EXAM:  Flowsheet Row Most Recent Value  Orbital Region No depletion  Upper Arm Region No depletion  Thoracic and Lumbar Region No depletion  Buccal Region No depletion  Temple Region No depletion  Clavicle Bone Region No depletion  Clavicle and Acromion Bone Region No depletion  Scapular Bone Region No depletion  Dorsal Hand Mild depletion  Patellar Region Mild depletion  Anterior Thigh Region Mild depletion  Posterior Calf Region Mild depletion   Diet Order:   Diet Order             Diet NPO time specified Except for: Sips with Meds  Diet effective now                   EDUCATION NEEDS:   No education needs have been identified at this time  Skin:  Skin Assessment: Skin  Integrity Issues: Skin Integrity Issues:: Stage I Stage I: Coccyx  Last BM:  8/19  Height:   Ht Readings from Last 1 Encounters:  10/11/22 5\' 5"  (1.651 m)    Weight:   Wt Readings from Last 1 Encounters:  10/11/22 81.6 kg    Ideal Body Weight:  56.8 kg  BMI:  Body mass index is 29.95 kg/m.  Estimated Nutritional Needs:  Kcal:  1650-1850 Protein:  80-100 grams Fluid:  >/= 1.6 L   Kirby Crigler RD, LDN Clinical Dietitian See [facility] for contact information.

## 2022-10-13 NOTE — Plan of Care (Signed)

## 2022-10-14 DIAGNOSIS — R627 Adult failure to thrive: Secondary | ICD-10-CM | POA: Diagnosis not present

## 2022-10-14 DIAGNOSIS — N390 Urinary tract infection, site not specified: Secondary | ICD-10-CM | POA: Diagnosis not present

## 2022-10-14 DIAGNOSIS — G9341 Metabolic encephalopathy: Secondary | ICD-10-CM | POA: Diagnosis not present

## 2022-10-14 DIAGNOSIS — R319 Hematuria, unspecified: Secondary | ICD-10-CM | POA: Diagnosis not present

## 2022-10-14 LAB — CBC WITH DIFFERENTIAL/PLATELET
Abs Immature Granulocytes: 0.03 10*3/uL (ref 0.00–0.07)
Basophils Absolute: 0 10*3/uL (ref 0.0–0.1)
Basophils Relative: 0 %
Eosinophils Absolute: 0.2 10*3/uL (ref 0.0–0.5)
Eosinophils Relative: 3 %
HCT: 28.5 % — ABNORMAL LOW (ref 36.0–46.0)
Hemoglobin: 9.2 g/dL — ABNORMAL LOW (ref 12.0–15.0)
Immature Granulocytes: 1 %
Lymphocytes Relative: 9 %
Lymphs Abs: 0.6 10*3/uL — ABNORMAL LOW (ref 0.7–4.0)
MCH: 30.6 pg (ref 26.0–34.0)
MCHC: 32.3 g/dL (ref 30.0–36.0)
MCV: 94.7 fL (ref 80.0–100.0)
Monocytes Absolute: 0.5 10*3/uL (ref 0.1–1.0)
Monocytes Relative: 8 %
Neutro Abs: 5.2 10*3/uL (ref 1.7–7.7)
Neutrophils Relative %: 79 %
Platelets: 243 10*3/uL (ref 150–400)
RBC: 3.01 MIL/uL — ABNORMAL LOW (ref 3.87–5.11)
RDW: 13.9 % (ref 11.5–15.5)
WBC: 6.5 10*3/uL (ref 4.0–10.5)
nRBC: 0 % (ref 0.0–0.2)

## 2022-10-14 LAB — BASIC METABOLIC PANEL
Anion gap: 11 (ref 5–15)
BUN: 8 mg/dL (ref 8–23)
CO2: 27 mmol/L (ref 22–32)
Calcium: 9.1 mg/dL (ref 8.9–10.3)
Chloride: 101 mmol/L (ref 98–111)
Creatinine, Ser: 0.59 mg/dL (ref 0.44–1.00)
GFR, Estimated: >60 mL/min (ref >60)
Glucose, Bld: 116 mg/dL — ABNORMAL HIGH (ref 70–99)
Potassium: 3.3 mmol/L — ABNORMAL LOW (ref 3.5–5.1)
Sodium: 139 mmol/L (ref 135–145)

## 2022-10-14 LAB — BRAIN NATRIURETIC PEPTIDE: B Natriuretic Peptide: 128.8 pg/mL — ABNORMAL HIGH (ref 0.0–100.0)

## 2022-10-14 LAB — MAGNESIUM: Magnesium: 1.5 mg/dL — ABNORMAL LOW (ref 1.7–2.4)

## 2022-10-14 LAB — PROCALCITONIN: Procalcitonin: <0.1 ng/mL

## 2022-10-14 LAB — C-REACTIVE PROTEIN: CRP: 4.4 mg/dL — ABNORMAL HIGH (ref <1.0)

## 2022-10-14 MED ORDER — MAGNESIUM SULFATE 2 GM/50ML IV SOLN
2.0000 g | Freq: Once | INTRAVENOUS | Status: AC
  Administered 2022-10-14: 2 g via INTRAVENOUS
  Filled 2022-10-14: qty 50

## 2022-10-14 MED ORDER — BISACODYL 10 MG RE SUPP
10.0000 mg | Freq: Two times a day (BID) | RECTAL | Status: AC
  Administered 2022-10-14: 10 mg via RECTAL
  Filled 2022-10-14: qty 1

## 2022-10-14 MED ORDER — CEFAZOLIN SODIUM-DEXTROSE 1-4 GM/50ML-% IV SOLN
1.0000 g | Freq: Three times a day (TID) | INTRAVENOUS | Status: AC
  Administered 2022-10-14 – 2022-10-16 (×8): 1 g via INTRAVENOUS
  Filled 2022-10-14 (×8): qty 50

## 2022-10-14 MED ORDER — POTASSIUM CHLORIDE 10 MEQ/100ML IV SOLN
10.0000 meq | INTRAVENOUS | Status: AC
  Administered 2022-10-14 (×4): 10 meq via INTRAVENOUS
  Filled 2022-10-14 (×2): qty 100

## 2022-10-14 NOTE — Progress Notes (Addendum)
Ok to optimize ceftriaxone to cefazolin to complete a total of 5d of abx per Dr Randol Kern.  Ulyses Southward, PharmD, BCIDP, AAHIVP, CPP Infectious Disease Pharmacist 10/14/2022 10:19 AM

## 2022-10-14 NOTE — Progress Notes (Addendum)
PROGRESS NOTE                                                                                                                                                                                                             Patient Demographics:    Leslie Farley, is a 89 y.o. female, DOB - December 06, 1930, ZOX:[id number]  Outpatient Primary MD for the patient is Pcp, No    LOS - 3  Admit date - 10/11/2022    Chief Complaint  Patient presents with   Altered Mental Status       Brief Narrative (HPI from H&P)    89 y.o. Leslie Farley speaking female with dementia hypertension, COPD/asthma, anemia, arthritis, GERD who presents after being noted to be acutely altered 8/18 morning.  She had just recently been admitted into the hospital from 7/23-7/30 with acute hypoxic respiratory failure with abdominal pain secondary to colonic ileus. History is limited from the patient and her daughter over the phone.It was reported that she has had decreased responsiveness  and didn't want to eat breakfast. Over the he last couple months she has had a progressive decline in her mental status.  The ER workup suggestive of UTI along with possible reoccurrence of ileus and she was admitted to the hospital.   Subjective:    [name] today she remains altered, unable to provide any reliable complaints, no significant events overnight as discussed with staff, she had multiple BMs yesterday.     Assessment  & Plan :    Acute metabolic encephalopathy due to Klebsiella UTI, present on admission -As well patient with known underlying dementia -Patient presents with failure to thrive, worsening mentation is most likely due to acute UTI, but with significant underlying dementia - She has been placed on appropriate IV Rocephin along with IV fluids for hydration, has poor quality of life at baseline with advanced dementia, no headache or focal  deficits. - continue supportive care. - PT OT and speech consulted.   -Continue with current management, no escalation of care, can consider palliative if she fails to improve  Recurrent colonic ileus  -Patient with abdominal distention, repeat CT abdomen pelvis confirming colonic ileus  -She is having good bowel movements for now, will continue with Dulcolax as needed  -No nausea, no vomiting.    Advance underlying dementia.   -Supportive  care.  At risk for delirium.  Minimize narcotics and benzodiazepines.  Anemia of chronic disease.  Stable.  Hypokalemia.  Replaced.  Hypomagnesemia -Replaced  HX of asthma and COPD.  Does have minimal wheezing, nebulizer treatments added, supplemental oxygen as needed.  Monitor for fluid overload.  Hypertension.  On Norvasc, add as needed hydralazine.       Condition - Extremely Guarded  Family Communication  :  daughter bedisde 10/13/22, confirmed DNR, if declines comfort measures.  Code Status :  DNR  Consults  :    PUD Prophylaxis :  PPI   Procedures  :     CT Abd Pelvis -       Disposition Plan  :    Status is: Inpatient  DVT Prophylaxis  :    enoxaparin (LOVENOX) injection 40 mg Start: 10/11/22 2200    Lab Results  Component Value Date   PLT 243 10/14/2022    Diet :  Diet Order             Diet NPO time specified Except for: Sips with Meds  Diet effective now                    Inpatient Medications  Scheduled Meds:  amLODipine  10 mg Oral Daily   bisacodyl  10 mg Rectal BID   cholecalciferol  2,000 Units Oral Daily   cycloSPORINE  1 drop Both Eyes BID   enoxaparin (LOVENOX) injection  40 mg Subcutaneous Q24H   famotidine  20 mg Oral Daily   ipratropium-albuterol  3 mL Nebulization BID   melatonin  6 mg Oral QHS   mometasone-formoterol  2 puff Inhalation BID   pantoprazole  40 mg Oral Daily   polyethylene glycol  17 g Oral Daily   polyvinyl alcohol  1 drop Both Eyes BID   QUEtiapine  25 mg  Oral QHS   senna-docusate  2 tablet Oral QHS   sodium chloride flush  3 mL Intravenous Q12H   Continuous Infusions:  cefTRIAXone (ROCEPHIN)  IV 1 g (10/13/22 1536)   potassium chloride 10 mEq (10/14/22 0939)   PRN Meds:.acetaminophen **OR** acetaminophen, bisacodyl, hydrALAZINE, ipratropium-albuterol, ondansetron **OR** ondansetron (ZOFRAN) IV  Antibiotics  :    Anti-infectives (From admission, onward)    Start     Dose/Rate Route Frequency Ordered Stop   10/12/22 1500  cefTRIAXone (ROCEPHIN) 1 g in sodium chloride 0.9 % 100 mL IVPB        1 g 200 mL/hr over 30 Minutes Intravenous Every 24 hours 10/11/22 1558     10/11/22 1345  cefTRIAXone (ROCEPHIN) 1 g in sodium chloride 0.9 % 100 mL IVPB        1 g 200 mL/hr over 30 Minutes Intravenous  Once 10/11/22 1344 10/11/22 1457         Objective:   Vitals:   10/13/22 1743 10/13/22 2022 10/14/22 0025 10/14/22 0400  BP: (!) 119/105 (!) 138/54 (!) 155/78 128/60  Pulse: 90 (!) 102 88 76  Resp:  18 18 17   Temp: 99.1 F (37.3 C)  98.9 F (37.2 C) 98.6 F (37 C)  TempSrc: Oral  Oral Oral  SpO2:  100% 100% 100%  Weight:      Height:        Wt Readings from Last 3 Encounters:  10/11/22 81.6 kg  09/15/22 82.4 kg  03/30/22 96.7 kg     Intake/Output Summary (Last 24 hours) at 10/14/2022 4098 Last data filed at  10/13/2022 1519 Gross per 24 hour  Intake --  Output 500 ml  Net -500 ml     Physical Exam  He is awake, but noncommunicative, does not follow commands, chronically ill-appearing  awake Alert, Oriented X 3, No new F.N deficits, Normal affect Symmetrical Chest wall movement, Good air movement bilaterally, CTAB RRR,No Gallops,Rubs or new Murmurs, No Parasternal Heave +ve B.Sounds, abdomen is distended, with underlying periumbilical hernia No Cyanosis, Clubbing or edema, No new Rash or bruise           Data Review:    Recent Labs  Lab 10/11/22 1113 10/11/22 1709 10/12/22 0559 10/13/22 0013 10/14/22 0053   WBC 4.7  --  5.0 6.2 6.5  HGB 8.9* 9.3* 9.1* 9.6* 9.2*  HCT 28.2* 29.6* 28.5* 30.0* 28.5*  PLT 212  --  226 252 243  MCV 98.6  --  97.3 98.4 94.7  MCH 31.1  --  31.1 31.5 30.6  MCHC 31.6  --  31.9 32.0 32.3  RDW 14.2  --  14.0 13.9 13.9  LYMPHSABS  --   --   --   --  0.6*  MONOABS  --   --   --   --  0.5  EOSABS  --   --   --   --  0.2  BASOSABS  --   --   --   --  0.0    Recent Labs  Lab 10/11/22 1113 10/11/22 1123 10/11/22 1709 10/12/22 0559 10/13/22 0013 10/14/22 0053  NA 143  --   --  139 140 139  K 2.9*  --   --  3.1* 3.4* 3.3*  CL 104  --   --  102 100 101  CO2 28  --   --  28 28 27   ANIONGAP 11  --   --  9 12 11   GLUCOSE 99  --   --  82 119* 116*  BUN 14  --   --  9 12 8   CREATININE 0.88  --   --  0.89 0.69 0.59  AST 18  --   --   --   --   --   ALT 12  --   --   --   --   --   ALKPHOS 49  --   --   --   --   --   BILITOT 0.2*  --   --   --   --   --   ALBUMIN 2.7*  --   --   --   --   --   CRP  --  1.7*  --   --   --  4.4*  PROCALCITON  --  <0.10  --   --   --  <0.10  BNP  --   --   --   --  110.8* 128.8*  MG  --   --  1.7  --   --  1.5*  CALCIUM 8.6*  --   --  8.6* 9.3 9.1      Recent Labs  Lab 10/11/22 1113 10/11/22 1123 10/11/22 1709 10/12/22 0559 10/13/22 0013 10/14/22 0053  CRP  --  1.7*  --   --   --  4.4*  PROCALCITON  --  <0.10  --   --   --  <0.10  BNP  --   --   --   --  110.8* 128.8*  MG  --   --  1.7  --   --  1.5*  CALCIUM 8.6*  --   --  8.6* 9.3 9.1    Micro Results Recent Results (from the past 240 hour(s))  Urine Culture     Status: Abnormal   Collection Time: 10/11/22  1:45 PM   Specimen: Urine, Clean Catch  Result Value Ref Range Status   Specimen Description URINE, CLEAN CATCH  Final   Special Requests   Final    NONE Performed at [facility], [address] [place], [place] [address]    Culture >=100,000 COLONIES/mL KLEBSIELLA PNEUMONIAE (A)  Final   Report Status 10/13/2022 FINAL  Final   Organism ID,  Bacteria KLEBSIELLA PNEUMONIAE (A)  Final      Susceptibility   Klebsiella pneumoniae - MIC*    AMPICILLIN RESISTANT Resistant     CEFAZOLIN <=4 SENSITIVE Sensitive     CEFEPIME <=0.12 SENSITIVE Sensitive     CEFTRIAXONE <=0.25 SENSITIVE Sensitive     CIPROFLOXACIN <=0.25 SENSITIVE Sensitive     GENTAMICIN <=1 SENSITIVE Sensitive     IMIPENEM <=0.25 SENSITIVE Sensitive     NITROFURANTOIN 64 INTERMEDIATE Intermediate     TRIMETH/SULFA <=20 SENSITIVE Sensitive     AMPICILLIN/SULBACTAM 4 SENSITIVE Sensitive     PIP/TAZO <=4 SENSITIVE Sensitive     * >=100,000 COLONIES/mL KLEBSIELLA PNEUMONIAE    Radiology Reports CT ABDOMEN PELVIS WO CONTRAST  Result Date: 10/13/2022 CLINICAL DATA:  Abdominal pain and swelling. Bowel obstruction suspected. EXAM: CT ABDOMEN AND PELVIS WITHOUT CONTRAST TECHNIQUE: Multidetector CT imaging of the abdomen and pelvis was performed following the standard protocol without IV contrast. RADIATION DOSE REDUCTION: This exam was performed according to the departmental dose-optimization program which includes automated exposure control, adjustment of the mA and/or kV according to patient size and/or use of iterative reconstruction technique. COMPARISON:  September 15, 2022 FINDINGS: Lower chest: Bibasilar atelectasis versus airspace consolidation. Hepatobiliary: No focal liver abnormality is seen. Status post cholecystectomy. No biliary dilatation. Pancreas: Unremarkable. No pancreatic ductal dilatation or surrounding inflammatory changes. Spleen: Normal in size without focal abnormality. Adrenals/Urinary Tract: Adrenal glands are unremarkable. Kidneys are normal, without renal calculi, focal lesion, or hydronephrosis. Bladder is unremarkable. Stomach/Bowel: Normal appearance of the stomach and small bowel. There is gas and fluid distension of the rectum and sigmoid colon which transitions to normal caliber descending and transverse colon. Known atomic obstruction is identified. Left  colonic diverticulosis. Vascular/Lymphatic: Aortic atherosclerosis. No enlarged abdominal or pelvic lymph nodes. Reproductive: Uterus and bilateral adnexa are unremarkable. Other: No abdominal wall hernia or abnormality. No abdominopelvic ascites. Musculoskeletal: Osteopenia. Spondylosis and scoliosis of the lumbosacral spine.Subcutaneous stranding and thickening of the abdominal musculature in the left central abdomen/flank, image 33/89, series 3. Small amount of subcutaneous gas is also seen in this area, image 43/89, series 3. IMPRESSION: 1. Gas and fluid distension of the rectum and sigmoid colon which transitions to normal caliber descending and transverse colon. No definite atomic obstruction is identified. 2. Left colonic diverticulosis without evidence of acute diverticulitis. 3. Subcutaneous stranding and thickening of the abdominal musculature in the left central abdomen/flank. Small amount of subcutaneous gas is also seen in this area. This may represent focal hematoma or cellulitis/myositis. 4. Bibasilar atelectasis versus airspace consolidation. 5. Aortic atherosclerosis. Aortic Atherosclerosis (ICD10-I70.0). Electronically Signed   By: Ted Mcalpine M.D.   On: 10/13/2022 11:34   DG Abd 1 View  Result Date: 10/13/2022 CLINICAL DATA:  89 year old female with ileus. EXAM: ABDOMEN - 1 VIEW COMPARISON:  Abdominal radiograph 10/11/2022. FINDINGS: Multiple prominent gas-filled  loops of small bowel and colon are again noted. Gaseous distention of the sigmoid colon measuring up to 10 cm in diameter. No pneumoperitoneum. IMPRESSION: 1. Nonspecific bowel gas pattern, which may again suggest an ileus, very similar to the prior examination. Electronically Signed   By: Trudie Reed M.D.   On: 10/13/2022 09:00   DG Chest Port 1 View  Result Date: 10/13/2022 CLINICAL DATA:  [id number] with shortness of breath, wheezing and COPD. EXAM: PORTABLE CHEST 1 VIEW COMPARISON:  Portable chest 10/11/2022 at 5:16  p.m. FINDINGS: 6:26 a.m. The lungs are clear. No pleural effusion is seen. The cardiomediastinal silhouette and vascular pattern are normal. There is calcific plaque in the aortic arch. Advanced thoracic spondylosis and advanced right shoulder DJD. IMPRESSION: No evidence of acute chest disease.  Aortic atherosclerosis. Electronically Signed   By: Almira Bar M.D.   On: 10/13/2022 06:57   DG ABD ACUTE 2+V W 1V CHEST  Result Date: 10/11/2022 CLINICAL DATA:  Abdominal distention, nausea and vomiting. EXAM: DG ABDOMEN ACUTE WITH 1 VIEW CHEST COMPARISON:  Abdominal radiograph dated 09/18/2022 and chest radiograph dated 09/16/2022. FINDINGS: Multiple gas-filled loops of bowel are seen throughout the abdomen. At least a few loops of colon appear distended in the lower midline abdomen, similar to 09/18/2022. No air-fluid levels or free intraperitoneal air are noted. No radiopaque calculi are seen. Heart size and mediastinal contours are within normal limits. Both lungs are clear. IMPRESSION: 1. Multiple gas-filled loops of bowel throughout the abdomen with at least a few loops of colon appearing distended in the lower midline abdomen, similar to 09/18/2022. These findings likely reflect ileus. 2.  No acute pulmonary process. Electronically Signed   By: Romona Curls M.D.   On: 10/11/2022 18:19      Signature  -   Mliss Fritz Zameer Borman M.D on 10/14/2022 at 9:43 AM   -  To page go to [email or web address]

## 2022-10-14 NOTE — Care Management Important Message (Signed)
Important Message  Patient Details  Name: Leslie Farley MRN: [id number] Date of Birth: [birth year]   Medicare Important Message Given:  Yes     Dorena Bodo 10/14/2022, 2:11 PM

## 2022-10-14 NOTE — Care Management Important Message (Signed)
Important Message  Patient Details  Name: Leslie Farley MRN: [id number] Date of Birth: [birth year]   Medicare Important Message Given:  Yes     Kerra Guilfoil Stefan Church 10/14/2022, 2:06 PM

## 2022-10-14 NOTE — Evaluation (Signed)
Clinical/Bedside Swallow Evaluation Patient Details  Name: Leslie Farley MRN: [id number] Date of Birth: 1930/04/16  Today's Date: 10/14/2022 Time: SLP Start Time (ACUTE ONLY): 1015 SLP Stop Time (ACUTE ONLY): 1030 SLP Time Calculation (min) (ACUTE ONLY): 15 min  Past Medical History:  Past Medical History:  Diagnosis Date   Arthritis    Asthma    COPD (chronic obstructive pulmonary disease) (HCC)    GERD (gastroesophageal reflux disease)    Hypertension    Past Surgical History:  Past Surgical History:  Procedure Laterality Date   CHOLECYSTECTOMY     HPI:  Patient is an 89 y.o. [place] speaking female with PMH: dementia, HTN, COPD, asthma, anemia, GERD, arthritis. She had recently been admitted to the hospital 7/23-7/30/24 with acute hypoxic respiratory failure with abdominal pain secondary to colonic ileus. Daughter provided history over the phone during current admission and reported that over the past couple months she has had progressive decline in her mental status and has been eating pureed foods lately due to not wanting to wear her dentures. She presented to the hospital on 10/11/22 for this current admission with c/o decreased responsiveness, not wanting to eat breakfast. In ED, she was afebrile, vital signs maintaned. Patient admitted with UTI, acute metabolic encephalopathy suspected to be related to acute infection and dehydration, worsening dementia.    Assessment / Plan / Recommendation  Clinical Impression  Patient is presenting with clinical s/s of what currently appears to be a cognitive-based dysphagia. She had eyes closed when SLP arrived but she awakened to voice. She was able to open her mouth with verbal and tactile cues but was not consistent in doing so. Oral mucosa was dry but clear of any secretions or PO residuals. Patient did allow SLP to provide oral care via toothette sponge. When raising HOB, patient did start vocalizing but no clear verbal  communication. She was able to be alert for limited PO trials only. (honey thick liquids via spoon sip, thin liquids via spoon and cup sip) She exhibited reduced oral preparatory phase of swallow and when bolus was in oral cavity, she exhibited delayed oral transit and delayed swallow initiation. No overt s/s aspiration observed. No PO's in oral cavity when inspected at end of session. SLP recommending continue NPO status with hopes that she will become more alert and able to trial PO's. SLP Visit Diagnosis: Dysphagia, unspecified (R13.10)    Aspiration Risk  Risk for inadequate nutrition/hydration;Moderate aspiration risk    Diet Recommendation NPO    Medication Administration: Via alternative means    Other  Recommendations Oral Care Recommendations: Oral care QID;Staff/trained caregiver to provide oral care    Recommendations for follow up therapy are one component of a multi-disciplinary discharge planning process, led by the attending physician.  Recommendations may be updated based on patient status, additional functional criteria and insurance authorization.  Follow up Recommendations Skilled nursing-short term rehab (<3 hours/day)      Assistance Recommended at Discharge    Functional Status Assessment Patient has had a recent decline in their functional status and demonstrates the ability to make significant improvements in function in a reasonable and predictable amount of time.  Frequency and Duration min 2x/week  2 weeks       Prognosis Prognosis for improved oropharyngeal function: Fair Barriers to Reach Goals: Cognitive deficits      Swallow Study   General Date of Onset: 10/13/22 HPI: Patient is an 89 y.o. [place] speaking female with PMH: dementia, HTN, COPD, asthma,  anemia, GERD, arthritis. She had recently been admitted to the hospital 7/23-7/30/24 with acute hypoxic respiratory failure with abdominal pain secondary to colonic ileus. Daughter provided history  over the phone during current admission and reported that over the past couple months she has had progressive decline in her mental status and has been eating pureed foods lately due to not wanting to wear her dentures. She presented to the hospital on 10/11/22 for this current admission with c/o decreased responsiveness, not wanting to eat breakfast. In ED, she was afebrile, vital signs maintaned. Patient admitted with UTI, acute metabolic encephalopathy suspected to be related to acute infection and dehydration, worsening dementia. Type of Study: Bedside Swallow Evaluation Previous Swallow Assessment: none found Diet Prior to this Study: NPO Temperature Spikes Noted: No Respiratory Status: Room air History of Recent Intubation: No Behavior/Cognition: Lethargic/Drowsy;Requires cueing Oral Cavity Assessment: Dry Oral Care Completed by SLP: Yes Oral Cavity - Dentition: Edentulous Self-Feeding Abilities: Total assist Patient Positioning: Upright in bed Baseline Vocal Quality: Other (comment);Normal (minimal vocalizations heard) Volitional Cough: Cognitively unable to elicit Volitional Swallow: Unable to elicit    Oral/Motor/Sensory Function Overall Oral Motor/Sensory Function: Other (comment) (did not participate but no focal weakness observed)   Ice Chips Ice chips: Not tested   Thin Liquid Thin Liquid: Impaired Presentation: Spoon;Cup Oral Phase Impairments: Reduced labial seal;Poor awareness of bolus Oral Phase Functional Implications: Oral holding Pharyngeal  Phase Impairments: Suspected delayed Swallow    Nectar Thick Nectar Thick Liquid: Not tested   Honey Thick Honey Thick Liquid: Impaired Presentation: Spoon Oral Phase Impairments: Reduced labial seal;Poor awareness of bolus Oral Phase Functional Implications: Oral holding Pharyngeal Phase Impairments: Suspected delayed Swallow   Puree Puree: Not tested   Solid     Solid: Not tested     Angela Nevin, MA, CCC-SLP Speech  Therapy

## 2022-10-14 NOTE — Plan of Care (Signed)

## 2022-10-15 DIAGNOSIS — G9341 Metabolic encephalopathy: Secondary | ICD-10-CM | POA: Diagnosis not present

## 2022-10-15 DIAGNOSIS — R319 Hematuria, unspecified: Secondary | ICD-10-CM | POA: Diagnosis not present

## 2022-10-15 DIAGNOSIS — N390 Urinary tract infection, site not specified: Secondary | ICD-10-CM | POA: Diagnosis not present

## 2022-10-15 LAB — C-REACTIVE PROTEIN: CRP: 8.6 mg/dL — ABNORMAL HIGH (ref <1.0)

## 2022-10-15 LAB — BASIC METABOLIC PANEL WITH GFR
Anion gap: 12 (ref 5–15)
BUN: 7 mg/dL — ABNORMAL LOW (ref 8–23)
CO2: 25 mmol/L (ref 22–32)
Calcium: 9 mg/dL (ref 8.9–10.3)
Chloride: 98 mmol/L (ref 98–111)
Creatinine, Ser: 0.69 mg/dL (ref 0.44–1.00)
GFR, Estimated: >60 mL/min
Glucose, Bld: 86 mg/dL (ref 70–99)
Potassium: 3.2 mmol/L — ABNORMAL LOW (ref 3.5–5.1)
Sodium: 135 mmol/L (ref 135–145)

## 2022-10-15 LAB — CBC WITH DIFFERENTIAL/PLATELET
Abs Immature Granulocytes: 0.03 K/uL (ref 0.00–0.07)
Basophils Absolute: 0 K/uL (ref 0.0–0.1)
Basophils Relative: 0 %
Eosinophils Absolute: 0.3 K/uL (ref 0.0–0.5)
Eosinophils Relative: 3 %
HCT: 28.4 % — ABNORMAL LOW (ref 36.0–46.0)
Hemoglobin: 9.5 g/dL — ABNORMAL LOW (ref 12.0–15.0)
Immature Granulocytes: 0 %
Lymphocytes Relative: 11 %
Lymphs Abs: 0.9 K/uL (ref 0.7–4.0)
MCH: 32.1 pg (ref 26.0–34.0)
MCHC: 33.5 g/dL (ref 30.0–36.0)
MCV: 95.9 fL (ref 80.0–100.0)
Monocytes Absolute: 0.6 K/uL (ref 0.1–1.0)
Monocytes Relative: 8 %
Neutro Abs: 6.1 K/uL (ref 1.7–7.7)
Neutrophils Relative %: 78 %
Platelets: 242 K/uL (ref 150–400)
RBC: 2.96 MIL/uL — ABNORMAL LOW (ref 3.87–5.11)
RDW: 13.8 % (ref 11.5–15.5)
WBC: 8 K/uL (ref 4.0–10.5)
nRBC: 0 % (ref 0.0–0.2)

## 2022-10-15 LAB — MAGNESIUM: Magnesium: 1.9 mg/dL (ref 1.7–2.4)

## 2022-10-15 LAB — PHOSPHORUS: Phosphorus: 2.6 mg/dL (ref 2.5–4.6)

## 2022-10-15 LAB — BRAIN NATRIURETIC PEPTIDE: B Natriuretic Peptide: 125.2 pg/mL — ABNORMAL HIGH (ref 0.0–100.0)

## 2022-10-15 LAB — PROCALCITONIN: Procalcitonin: 0.13 ng/mL

## 2022-10-15 MED ORDER — POTASSIUM CHLORIDE 10 MEQ/100ML IV SOLN
10.0000 meq | INTRAVENOUS | Status: AC
  Administered 2022-10-15 (×4): 10 meq via INTRAVENOUS
  Filled 2022-10-15: qty 100

## 2022-10-15 NOTE — Plan of Care (Signed)
  Problem: Education: Goal: Knowledge of General Education information will improve Description Including pain rating scale, medication(s)/side effects and non-pharmacologic comfort measures Outcome: Progressing   Problem: Health Behavior/Discharge Planning: Goal: Ability to manage health-related needs will improve Outcome: Progressing   Problem: Clinical Measurements: Goal: Ability to maintain clinical measurements within normal limits will improve Outcome: Progressing Goal: Will remain free from infection Outcome: Progressing Goal: Diagnostic test results will improve Outcome: Progressing Goal: Respiratory complications will improve Outcome: Progressing   Problem: Activity: Goal: Risk for activity intolerance will decrease Outcome: Progressing   Problem: Nutrition: Goal: Adequate nutrition will be maintained Outcome: Progressing   Problem: Pain Managment: Goal: General experience of comfort will improve Outcome: Progressing   Problem: Safety: Goal: Ability to remain free from injury will improve Outcome: Progressing   Problem: Skin Integrity: Goal: Risk for impaired skin integrity will decrease Outcome: Progressing

## 2022-10-15 NOTE — Progress Notes (Signed)
Speech Language Pathology Treatment: Dysphagia  Patient Details Name: Leslie Farley MRN: [id number] DOB: 01-28-1931 Today's Date: 10/15/2022 Time: 5366-4403 SLP Time Calculation (min) (ACUTE ONLY): 36 min  Assessment / Plan / Recommendation Clinical Impression  Pt is more alert today per nursing report, and was seen using [place] video interpreter. Daughter also arrived during the session and assisted as pt would alternate with [place]. Pt consumed applesauce and water with oral manipulation and transit that appeared to be slow, but given extra time, she had good clearance. No overt signs of aspiration were noted during PO trials, but then a strong cough response was noted several minutes after POs had stopped. Question possible retrograde flow (if related to POs), but her daughter denies any h/o this. She also confirms that the pt has been on pureed foods for several months at home and is agreeable to starting dys 1 diet and thin liquids here. Encouraged careful monitoring and focus on good positioning.   HPI HPI: Patient is an 89 y.o. [place] speaking female with PMH: dementia, HTN, COPD, asthma, anemia, GERD, arthritis. She had recently been admitted to the hospital 7/23-7/30/24 with acute hypoxic respiratory failure with abdominal pain secondary to colonic ileus. Daughter provided history over the phone during current admission and reported that over the past couple months she has had progressive decline in her mental status and has been eating pureed foods lately due to not wanting to wear her dentures. She presented to the hospital on 10/11/22 for this current admission with c/o decreased responsiveness, not wanting to eat breakfast. In ED, she was afebrile, vital signs maintaned. Patient admitted with UTI, acute metabolic encephalopathy suspected to be related to acute infection and dehydration, worsening dementia.      SLP Plan  Continue with current plan of care      Recommendations  for follow up therapy are one component of a multi-disciplinary discharge planning process, led by the attending physician.  Recommendations may be updated based on patient status, additional functional criteria and insurance authorization.    Recommendations  Diet recommendations: Dysphagia 1 (puree);Thin liquid Liquids provided via: Cup;Straw Medication Administration: Crushed with puree Supervision: Staff to assist with self feeding;Full supervision/cueing for compensatory strategies Compensations: Minimize environmental distractions;Slow rate;Small sips/bites Postural Changes and/or Swallow Maneuvers: Seated upright 90 degrees                  Oral care BID   Frequent or constant Supervision/Assistance Dysphagia, unspecified (R13.10)     Continue with current plan of care     Mahala Menghini., M.A. CCC-SLP Acute Rehabilitation Services Office [phone]  Secure chat preferred   10/15/2022, 3:09 PM

## 2022-10-15 NOTE — Plan of Care (Signed)
Patient more alert but remains drowsy and refuses her meds.

## 2022-10-15 NOTE — Progress Notes (Signed)
PROGRESS NOTE                                                                                                                                                                                                             Patient Demographics:    Leslie Farley, is a 89 y.o. female, DOB - August 31, 1930, UJW:[id number]  Outpatient Primary MD for the patient is Pcp, No    LOS - 4  Admit date - 10/11/2022    Chief Complaint  Patient presents with   Altered Mental Status       Brief Narrative (HPI from H&P)     89 y.o. [place] speaking female with dementia hypertension, COPD/asthma, anemia, arthritis, GERD who presents after being noted to be acutely altered 8/18 morning.  She had just recently been admitted into the hospital from 7/23-7/30 with acute hypoxic respiratory failure with abdominal pain secondary to colonic ileus. History is limited from the patient and her daughter over the phone.It was reported that she has had decreased responsiveness  and didn't want to eat breakfast. Over the he last couple months she has had a progressive decline in her mental status.  The ER workup suggestive of UTI along with possible reoccurrence of ileus and she was admitted to the hospital.   Subjective:    Leslie Farley today with no significant events overnight as discussed with staff, she is unable to provide a reliable complaints, she appears more awake today, she has been refusing to take her meds yesterday as discussed with staff.     Assessment  & Plan :    Acute metabolic encephalopathy due to Klebsiella UTI, present on admission -As well patient with known underlying dementia -Patient presents with failure to thrive, worsening mentation is most likely due to acute UTI, but with significant underlying dementia -Still with IV Rocephin, urine culture significant for Klebsiella pneumonia, narrowed to IV cefazolin  - continue  supportive care. - PT OT and speech consulted.   -Continue with current management, no escalation of care, can consider palliative if she fails to improve -Appears to be more awake today, able to tolerate few spoons of applesauce with me, will reconsult SLP.  Recurrent colonic ileus  -Patient with abdominal distention, repeat CT abdomen pelvis confirming colonic ileus  -She is having good bowel movements for now, will continue with  Dulcolax as needed  -No nausea, no vomiting.    Advance underlying dementia.   -Supportive care.  At risk for delirium.  Minimize narcotics and benzodiazepines.  Anemia of chronic disease.  Stable.  Hypokalemia.  Replaced.  Hypomagnesemia -Replaced  HX of asthma and COPD.  Does have minimal wheezing, nebulizer treatments added, supplemental oxygen as needed.  Monitor for fluid overload.  Hypertension.  On Norvasc, add as needed hydralazine.       Condition - Extremely Guarded  Family Communication  : None at bedside today.  Code Status :  DNR  Consults  :    PUD Prophylaxis :  PPI   Procedures  :     CT Abd Pelvis -       Disposition Plan  :    Status is: Inpatient  DVT Prophylaxis  :    enoxaparin (LOVENOX) injection 40 mg Start: 10/11/22 2200    Lab Results  Component Value Date   PLT 242 10/15/2022    Diet :  Diet Order             Diet NPO time specified Except for: Sips with Meds  Diet effective now                    Inpatient Medications  Scheduled Meds:  amLODipine  10 mg Oral Daily   bisacodyl  10 mg Rectal BID   cholecalciferol  2,000 Units Oral Daily   cycloSPORINE  1 drop Both Eyes BID   enoxaparin (LOVENOX) injection  40 mg Subcutaneous Q24H   famotidine  20 mg Oral Daily   ipratropium-albuterol  3 mL Nebulization BID   melatonin  6 mg Oral QHS   mometasone-formoterol  2 puff Inhalation BID   pantoprazole  40 mg Oral Daily   polyvinyl alcohol  1 drop Both Eyes BID   QUEtiapine  25 mg Oral QHS    senna-docusate  2 tablet Oral QHS   sodium chloride flush  3 mL Intravenous Q12H   Continuous Infusions:   ceFAZolin (ANCEF) IV 1 g (10/15/22 0531)   potassium chloride 10 mEq (10/15/22 1021)   PRN Meds:.acetaminophen **OR** acetaminophen, bisacodyl, hydrALAZINE, ipratropium-albuterol, ondansetron **OR** ondansetron (ZOFRAN) IV  Antibiotics  :    Anti-infectives (From admission, onward)    Start     Dose/Rate Route Frequency Ordered Stop   10/14/22 1400  ceFAZolin (ANCEF) IVPB 1 g/50 mL premix        1 g 100 mL/hr over 30 Minutes Intravenous Every 8 hours 10/14/22 1018 10/17/22 0559   10/12/22 1500  cefTRIAXone (ROCEPHIN) 1 g in sodium chloride 0.9 % 100 mL IVPB  Status:  Discontinued        1 g 200 mL/hr over 30 Minutes Intravenous Every 24 hours 10/11/22 1558 10/14/22 1018   10/11/22 1345  cefTRIAXone (ROCEPHIN) 1 g in sodium chloride 0.9 % 100 mL IVPB        1 g 200 mL/hr over 30 Minutes Intravenous  Once 10/11/22 1344 10/11/22 1457         Objective:   Vitals:   10/15/22 0720 10/15/22 0850 10/15/22 0900 10/15/22 1000  BP:  (!) 183/88    Pulse:  83 84   Resp:  (!) 22 (!) 22 (!) 22  Temp:  99 F (37.2 C)    TempSrc:  Oral    SpO2: 100% 100% 99%   Weight:      Height:  Wt Readings from Last 3 Encounters:  10/11/22 81.6 kg  09/15/22 82.4 kg  03/30/22 96.7 kg     Intake/Output Summary (Last 24 hours) at 10/15/2022 1214 Last data filed at 10/15/2022 1000 Gross per 24 hour  Intake 0 ml  Output 550 ml  Net -550 ml     Physical Exam  He is more awake today, chronically ill appearing Symmetrical Chest wall movement, Good air movement bilaterally, CTAB RRR,No Gallops,Rubs or new Murmurs, No Parasternal Heave +ve B.Sounds, abdomen is distended, nontender, underlying periumbilical hernia, nontender No Cyanosis, Clubbing or edema, No new Rash or bruise          Data Review:    Recent Labs  Lab 10/11/22 1113 10/11/22 1709 10/12/22 0559  10/13/22 0013 10/14/22 0053 10/15/22 0235  WBC 4.7  --  5.0 6.2 6.5 8.0  HGB 8.9* 9.3* 9.1* 9.6* 9.2* 9.5*  HCT 28.2* 29.6* 28.5* 30.0* 28.5* 28.4*  PLT 212  --  226 252 243 242  MCV 98.6  --  97.3 98.4 94.7 95.9  MCH 31.1  --  31.1 31.5 30.6 32.1  MCHC 31.6  --  31.9 32.0 32.3 33.5  RDW 14.2  --  14.0 13.9 13.9 13.8  LYMPHSABS  --   --   --   --  0.6* 0.9  MONOABS  --   --   --   --  0.5 0.6  EOSABS  --   --   --   --  0.2 0.3  BASOSABS  --   --   --   --  0.0 0.0    Recent Labs  Lab 10/11/22 1113 10/11/22 1123 10/11/22 1709 10/12/22 0559 10/13/22 0013 10/14/22 0053 10/15/22 0235  NA 143  --   --  139 140 139 135  K 2.9*  --   --  3.1* 3.4* 3.3* 3.2*  CL 104  --   --  102 100 101 98  CO2 28  --   --  28 28 27 25   ANIONGAP 11  --   --  9 12 11 12   GLUCOSE 99  --   --  82 119* 116* 86  BUN 14  --   --  9 12 8  7*  CREATININE 0.88  --   --  0.89 0.69 0.59 0.69  AST 18  --   --   --   --   --   --   ALT 12  --   --   --   --   --   --   ALKPHOS 49  --   --   --   --   --   --   BILITOT 0.2*  --   --   --   --   --   --   ALBUMIN 2.7*  --   --   --   --   --   --   CRP  --  1.7*  --   --   --  4.4* 8.6*  PROCALCITON  --  <0.10  --   --   --  <0.10 0.13  BNP  --   --   --   --  110.8* 128.8* 125.2*  MG  --   --  1.7  --   --  1.5* 1.9  CALCIUM 8.6*  --   --  8.6* 9.3 9.1 9.0      Recent Labs  Lab 10/11/22 1113 10/11/22 1123 10/11/22  1709 10/12/22 0559 10/13/22 0013 10/14/22 0053 10/15/22 0235  CRP  --  1.7*  --   --   --  4.4* 8.6*  PROCALCITON  --  <0.10  --   --   --  <0.10 0.13  BNP  --   --   --   --  110.8* 128.8* 125.2*  MG  --   --  1.7  --   --  1.5* 1.9  CALCIUM 8.6*  --   --  8.6* 9.3 9.1 9.0    Micro Results Recent Results (from the past 240 hour(s))  Urine Culture     Status: Abnormal   Collection Time: 10/11/22  1:45 PM   Specimen: Urine, Clean Catch  Result Value Ref Range Status   Specimen Description URINE, CLEAN CATCH  Final   Special  Requests   Final    NONE Performed at [facility], [address], [place], [place] [address]    Culture >=100,000 COLONIES/mL KLEBSIELLA PNEUMONIAE (A)  Final   Report Status 10/13/2022 FINAL  Final   Organism ID, Bacteria KLEBSIELLA PNEUMONIAE (A)  Final      Susceptibility   Klebsiella pneumoniae - MIC*    AMPICILLIN RESISTANT Resistant     CEFAZOLIN <=4 SENSITIVE Sensitive     CEFEPIME <=0.12 SENSITIVE Sensitive     CEFTRIAXONE <=0.25 SENSITIVE Sensitive     CIPROFLOXACIN <=0.25 SENSITIVE Sensitive     GENTAMICIN <=1 SENSITIVE Sensitive     IMIPENEM <=0.25 SENSITIVE Sensitive     NITROFURANTOIN 64 INTERMEDIATE Intermediate     TRIMETH/SULFA <=20 SENSITIVE Sensitive     AMPICILLIN/SULBACTAM 4 SENSITIVE Sensitive     PIP/TAZO <=4 SENSITIVE Sensitive     * >=100,000 COLONIES/mL KLEBSIELLA PNEUMONIAE    Radiology Reports CT ABDOMEN PELVIS WO CONTRAST  Result Date: 10/13/2022 CLINICAL DATA:  Abdominal pain and swelling. Bowel obstruction suspected. EXAM: CT ABDOMEN AND PELVIS WITHOUT CONTRAST TECHNIQUE: Multidetector CT imaging of the abdomen and pelvis was performed following the standard protocol without IV contrast. RADIATION DOSE REDUCTION: This exam was performed according to the departmental dose-optimization program which includes automated exposure control, adjustment of the mA and/or kV according to patient size and/or use of iterative reconstruction technique. COMPARISON:  September 15, 2022 FINDINGS: Lower chest: Bibasilar atelectasis versus airspace consolidation. Hepatobiliary: No focal liver abnormality is seen. Status post cholecystectomy. No biliary dilatation. Pancreas: Unremarkable. No pancreatic ductal dilatation or surrounding inflammatory changes. Spleen: Normal in size without focal abnormality. Adrenals/Urinary Tract: Adrenal glands are unremarkable. Kidneys are normal, without renal calculi, focal lesion, or hydronephrosis. Bladder is unremarkable. Stomach/Bowel:  Normal appearance of the stomach and small bowel. There is gas and fluid distension of the rectum and sigmoid colon which transitions to normal caliber descending and transverse colon. Known atomic obstruction is identified. Left colonic diverticulosis. Vascular/Lymphatic: Aortic atherosclerosis. No enlarged abdominal or pelvic lymph nodes. Reproductive: Uterus and bilateral adnexa are unremarkable. Other: No abdominal wall hernia or abnormality. No abdominopelvic ascites. Musculoskeletal: Osteopenia. Spondylosis and scoliosis of the lumbosacral spine.Subcutaneous stranding and thickening of the abdominal musculature in the left central abdomen/flank, image 33/89, series 3. Small amount of subcutaneous gas is also seen in this area, image 43/89, series 3. IMPRESSION: 1. Gas and fluid distension of the rectum and sigmoid colon which transitions to normal caliber descending and transverse colon. No definite atomic obstruction is identified. 2. Left colonic diverticulosis without evidence of acute diverticulitis. 3. Subcutaneous stranding and thickening of the abdominal musculature in the left  central abdomen/flank. Small amount of subcutaneous gas is also seen in this area. This may represent focal hematoma or cellulitis/myositis. 4. Bibasilar atelectasis versus airspace consolidation. 5. Aortic atherosclerosis. Aortic Atherosclerosis (ICD10-I70.0). Electronically Signed   By: Ted Mcalpine M.D.   On: 10/13/2022 11:34   DG Abd 1 View  Result Date: 10/13/2022 CLINICAL DATA:  89 year old female with ileus. EXAM: ABDOMEN - 1 VIEW COMPARISON:  Abdominal radiograph 10/11/2022. FINDINGS: Multiple prominent gas-filled loops of small bowel and colon are again noted. Gaseous distention of the sigmoid colon measuring up to 10 cm in diameter. No pneumoperitoneum. IMPRESSION: 1. Nonspecific bowel gas pattern, which may again suggest an ileus, very similar to the prior examination. Electronically Signed   By: Trudie Reed M.D.   On: 10/13/2022 09:00   DG Chest Port 1 View  Result Date: 10/13/2022 CLINICAL DATA:  [id number] with shortness of breath, wheezing and COPD. EXAM: PORTABLE CHEST 1 VIEW COMPARISON:  Portable chest 10/11/2022 at 5:16 p.m. FINDINGS: 6:26 a.m. The lungs are clear. No pleural effusion is seen. The cardiomediastinal silhouette and vascular pattern are normal. There is calcific plaque in the aortic arch. Advanced thoracic spondylosis and advanced right shoulder DJD. IMPRESSION: No evidence of acute chest disease.  Aortic atherosclerosis. Electronically Signed   By: Almira Bar M.D.   On: 10/13/2022 06:57   DG ABD ACUTE 2+V W 1V CHEST  Result Date: 10/11/2022 CLINICAL DATA:  Abdominal distention, nausea and vomiting. EXAM: DG ABDOMEN ACUTE WITH 1 VIEW CHEST COMPARISON:  Abdominal radiograph dated 09/18/2022 and chest radiograph dated 09/16/2022. FINDINGS: Multiple gas-filled loops of bowel are seen throughout the abdomen. At least a few loops of colon appear distended in the lower midline abdomen, similar to 09/18/2022. No air-fluid levels or free intraperitoneal air are noted. No radiopaque calculi are seen. Heart size and mediastinal contours are within normal limits. Both lungs are clear. IMPRESSION: 1. Multiple gas-filled loops of bowel throughout the abdomen with at least a few loops of colon appearing distended in the lower midline abdomen, similar to 09/18/2022. These findings likely reflect ileus. 2.  No acute pulmonary process. Electronically Signed   By: Romona Curls M.D.   On: 10/11/2022 18:19      Signature  -   Huey Bienenstock M.D on 10/15/2022 at 12:14 PM   -  To page go to [email or web address]

## 2022-10-16 DIAGNOSIS — G9341 Metabolic encephalopathy: Secondary | ICD-10-CM | POA: Diagnosis not present

## 2022-10-16 LAB — CBC WITH DIFFERENTIAL/PLATELET
Abs Immature Granulocytes: 0.04 10*3/uL (ref 0.00–0.07)
Basophils Absolute: 0 10*3/uL (ref 0.0–0.1)
Basophils Relative: 0 %
Eosinophils Absolute: 0.2 10*3/uL (ref 0.0–0.5)
Eosinophils Relative: 2 %
HCT: 27.1 % — ABNORMAL LOW (ref 36.0–46.0)
Hemoglobin: 8.7 g/dL — ABNORMAL LOW (ref 12.0–15.0)
Immature Granulocytes: 0 %
Lymphocytes Relative: 12 %
Lymphs Abs: 1.1 10*3/uL (ref 0.7–4.0)
MCH: 30.5 pg (ref 26.0–34.0)
MCHC: 32.1 g/dL (ref 30.0–36.0)
MCV: 95.1 fL (ref 80.0–100.0)
Monocytes Absolute: 0.9 10*3/uL (ref 0.1–1.0)
Monocytes Relative: 10 %
Neutro Abs: 6.9 10*3/uL (ref 1.7–7.7)
Neutrophils Relative %: 76 %
Platelets: 209 10*3/uL (ref 150–400)
RBC: 2.85 MIL/uL — ABNORMAL LOW (ref 3.87–5.11)
RDW: 13.8 % (ref 11.5–15.5)
WBC: 9.2 10*3/uL (ref 4.0–10.5)
nRBC: 0 % (ref 0.0–0.2)

## 2022-10-16 LAB — C-REACTIVE PROTEIN: CRP: 13.3 mg/dL — ABNORMAL HIGH (ref <1.0)

## 2022-10-16 LAB — BASIC METABOLIC PANEL
Anion gap: 10 (ref 5–15)
BUN: 6 mg/dL — ABNORMAL LOW (ref 8–23)
CO2: 26 mmol/L (ref 22–32)
Calcium: 8.7 mg/dL — ABNORMAL LOW (ref 8.9–10.3)
Chloride: 99 mmol/L (ref 98–111)
Creatinine, Ser: 0.78 mg/dL (ref 0.44–1.00)
GFR, Estimated: >60 mL/min (ref >60)
Glucose, Bld: 88 mg/dL (ref 70–99)
Potassium: 3 mmol/L — ABNORMAL LOW (ref 3.5–5.1)
Sodium: 135 mmol/L (ref 135–145)

## 2022-10-16 LAB — MAGNESIUM: Magnesium: 1.6 mg/dL — ABNORMAL LOW (ref 1.7–2.4)

## 2022-10-16 LAB — PROCALCITONIN: Procalcitonin: 0.12 ng/mL

## 2022-10-16 LAB — BRAIN NATRIURETIC PEPTIDE: B Natriuretic Peptide: 114.5 pg/mL — ABNORMAL HIGH (ref 0.0–100.0)

## 2022-10-16 MED ORDER — POTASSIUM CHLORIDE 10 MEQ/100ML IV SOLN
10.0000 meq | INTRAVENOUS | Status: AC
  Administered 2022-10-16 (×4): 10 meq via INTRAVENOUS
  Filled 2022-10-16 (×4): qty 100

## 2022-10-16 NOTE — Progress Notes (Signed)
Speech Language Pathology Treatment: Dysphagia  Patient Details Name: Aiana Thelusma MRN: [id number] DOB: 19-Aug-1930 Today's Date: 10/16/2022 Time: 0102-7253 SLP Time Calculation (min) (ACUTE ONLY): 12 min  Assessment / Plan / Recommendation Clinical Impression  Pt is sleepier this morning and needs more cueing to maintain alertness for PO trials from her breakfast tray. She did respond some to the video interpreter, but given her mumbling, he was not able to discern most of what she was saying. She consumed small amounts of purees and thin liquids with prolonged oral phase but no overt s/s of aspiration today. Suspect that her swallowing and her intake are likely to fluctuate. SLP will continue to follow, leaving pt on current diet for now.   HPI HPI: Patient is an 89 y.o. [place] speaking female with PMH: dementia, HTN, COPD, asthma, anemia, GERD, arthritis. She had recently been admitted to the hospital 7/23-7/30/24 with acute hypoxic respiratory failure with abdominal pain secondary to colonic ileus. Daughter provided history over the phone during current admission and reported that over the past couple months she has had progressive decline in her mental status and has been eating pureed foods lately due to not wanting to wear her dentures. She presented to the hospital on 10/11/22 for this current admission with c/o decreased responsiveness, not wanting to eat breakfast. In ED, she was afebrile, vital signs maintaned. Patient admitted with UTI, acute metabolic encephalopathy suspected to be related to acute infection and dehydration, worsening dementia.      SLP Plan  Continue with current plan of care      Recommendations for follow up therapy are one component of a multi-disciplinary discharge planning process, led by the attending physician.  Recommendations may be updated based on patient status, additional functional criteria and insurance authorization.    Recommendations   Diet recommendations: Dysphagia 1 (puree);Thin liquid Liquids provided via: Cup;Straw Medication Administration: Crushed with puree Supervision: Staff to assist with self feeding;Full supervision/cueing for compensatory strategies Compensations: Minimize environmental distractions;Slow rate;Small sips/bites Postural Changes and/or Swallow Maneuvers: Seated upright 90 degrees                  Oral care BID   Frequent or constant Supervision/Assistance Dysphagia, unspecified (R13.10)     Continue with current plan of care     Mahala Menghini., M.A. CCC-SLP Acute Rehabilitation Services Office [phone]  Secure chat preferred   10/16/2022, 11:18 AM

## 2022-10-16 NOTE — Progress Notes (Signed)
PROGRESS NOTE                                                                                                                                                                                                             Patient Demographics:    Leslie Farley, is a 89 y.o. female, DOB - [birth year], TFT:[id number]  Outpatient Primary MD for the patient is Pcp, No    LOS - 5  Admit date - 10/11/2022    Chief Complaint  Patient presents with   Altered Mental Status       Brief Narrative (HPI from H&P)     89 y.o. [place] speaking female with dementia hypertension, COPD/asthma, anemia, arthritis, GERD who presents after being noted to be acutely altered 8/18 morning.  She had just recently been admitted into the hospital from 7/23-7/30 with acute hypoxic respiratory failure with abdominal pain secondary to colonic ileus. History is limited from the patient and her daughter over the phone.It was reported that she has had decreased responsiveness  and didn't want to eat breakfast. Over the he last couple months she has had a progressive decline in her mental status.  The ER workup suggestive of UTI along with possible reoccurrence of ileus and she was admitted to the hospital.   Subjective:    Leslie Farley somnolent today, less conversant, unable to provide any complete plaints, she has not been able to tolerate any p.o. intake, has not taking any of her meds as she did not open her mouth.    Assessment  & Plan :    Acute metabolic encephalopathy due to Klebsiella UTI, present on admission -As well patient with known underlying dementia -Patient presents with failure to thrive, worsening mentation is most likely due to acute UTI, but with significant underlying dementia -Still with IV Rocephin, urine culture significant for Klebsiella pneumonia, narrowed to IV cefazolin  - continue supportive care. - PT OT and  speech consulted.   -Continue with current management, no escalation of care. -She was doing better yesterday, more awake, appetite and oral intake, but today she is somnolent, minimally interactive and responsive.  Recurrent colonic ileus  -Patient with abdominal distention, repeat CT abdomen pelvis confirming colonic ileus  -She is having good bowel movements for now, will continue with Dulcolax as needed  -No nausea, no vomiting.  Advance underlying dementia.   -Supportive care.  At risk for delirium.  Minimize narcotics and benzodiazepines.  Anemia of chronic disease.  Stable.  Hypokalemia.  Replaced.  Hypomagnesemia -Replaced  HX of asthma and COPD.  Does have minimal wheezing, nebulizer treatments added, supplemental oxygen as needed.  Monitor for fluid overload.  Hypertension.  On Norvasc, add as needed hydralazine.       Condition - Extremely Guarded  Family Communication  : Discussed with daughter at bedside  Code Status :  DNR  Consults  :    PUD Prophylaxis :  PPI   Procedures  :     CT Abd Pelvis -       Disposition Plan  :    Status is: Inpatient  DVT Prophylaxis  :    enoxaparin (LOVENOX) injection 40 mg Start: 10/11/22 2200    Lab Results  Component Value Date   PLT 209 10/16/2022    Diet :  Diet Order             DIET - DYS 1 Room service appropriate? No; Fluid consistency: Thin  Diet effective now                    Inpatient Medications  Scheduled Meds:  amLODipine  10 mg Oral Daily   cholecalciferol  2,000 Units Oral Daily   cycloSPORINE  1 drop Both Eyes BID   enoxaparin (LOVENOX) injection  40 mg Subcutaneous Q24H   famotidine  20 mg Oral Daily   ipratropium-albuterol  3 mL Nebulization BID   melatonin  6 mg Oral QHS   mometasone-formoterol  2 puff Inhalation BID   pantoprazole  40 mg Oral Daily   polyvinyl alcohol  1 drop Both Eyes BID   QUEtiapine  25 mg Oral QHS   senna-docusate  2 tablet Oral QHS   sodium  chloride flush  3 mL Intravenous Q12H   Continuous Infusions:   ceFAZolin (ANCEF) IV 1 g (10/16/22 1307)   PRN Meds:.acetaminophen **OR** acetaminophen, bisacodyl, hydrALAZINE, ipratropium-albuterol, ondansetron **OR** ondansetron (ZOFRAN) IV  Antibiotics  :    Anti-infectives (From admission, onward)    Start     Dose/Rate Route Frequency Ordered Stop   10/14/22 1400  ceFAZolin (ANCEF) IVPB 1 g/50 mL premix        1 g 100 mL/hr over 30 Minutes Intravenous Every 8 hours 10/14/22 1018 10/17/22 0559   10/12/22 1500  cefTRIAXone (ROCEPHIN) 1 g in sodium chloride 0.9 % 100 mL IVPB  Status:  Discontinued        1 g 200 mL/hr over 30 Minutes Intravenous Every 24 hours 10/11/22 1558 10/14/22 1018   10/11/22 1345  cefTRIAXone (ROCEPHIN) 1 g in sodium chloride 0.9 % 100 mL IVPB        1 g 200 mL/hr over 30 Minutes Intravenous  Once 10/11/22 1344 10/11/22 1457         Objective:   Vitals:   10/16/22 0800 10/16/22 0822 10/16/22 0823 10/16/22 1236  BP: (!) 109/52   (!) 112/46  Pulse: 71   76  Resp: (!) 22   20  Temp: 98.8 F (37.1 C)   98.9 F (37.2 C)  TempSrc: Oral   Oral  SpO2: 96% 97% 98% 96%  Weight:      Height:        Wt Readings from Last 3 Encounters:  10/16/22 85.2 kg  09/15/22 82.4 kg  03/30/22 96.7 kg  Intake/Output Summary (Last 24 hours) at 10/16/2022 1433 Last data filed at 10/16/2022 0916 Gross per 24 hour  Intake --  Output 575 ml  Net -575 ml     Physical Exam  Somnolent, less interactive today chronically ill-appearing, unable to answer any questions, does not open her eyes or follow any commands Symmetrical Chest wall movement, Good air movement bilaterally, CTAB RRR,No Gallops,Rubs or new Murmurs, No Parasternal Heave +ve B.Sounds, mildly distended, but nontender, bowel sounds present, underlying periumbilical hernia No Cyanosis, Clubbing or edema, No new Rash or bruise         Data Review:    Recent Labs  Lab 10/12/22 0559  10/13/22 0013 10/14/22 0053 10/15/22 0235 10/16/22 0711  WBC 5.0 6.2 6.5 8.0 9.2  HGB 9.1* 9.6* 9.2* 9.5* 8.7*  HCT 28.5* 30.0* 28.5* 28.4* 27.1*  PLT 226 252 243 242 209  MCV 97.3 98.4 94.7 95.9 95.1  MCH 31.1 31.5 30.6 32.1 30.5  MCHC 31.9 32.0 32.3 33.5 32.1  RDW 14.0 13.9 13.9 13.8 13.8  LYMPHSABS  --   --  0.6* 0.9 1.1  MONOABS  --   --  0.5 0.6 0.9  EOSABS  --   --  0.2 0.3 0.2  BASOSABS  --   --  0.0 0.0 0.0    Recent Labs  Lab 10/11/22 1113 10/11/22 1123 10/11/22 1709 10/12/22 0559 10/13/22 0013 10/14/22 0053 10/15/22 0235 10/16/22 0711  NA 143  --   --  139 140 139 135 135  K 2.9*  --   --  3.1* 3.4* 3.3* 3.2* 3.0*  CL 104  --   --  102 100 101 98 99  CO2 28  --   --  28 28 27 25 26   ANIONGAP 11  --   --  9 12 11 12 10   GLUCOSE 99  --   --  82 119* 116* 86 88  BUN 14  --   --  9 12 8  7* 6*  CREATININE 0.88  --   --  0.89 0.69 0.59 0.69 0.78  AST 18  --   --   --   --   --   --   --   ALT 12  --   --   --   --   --   --   --   ALKPHOS 49  --   --   --   --   --   --   --   BILITOT 0.2*  --   --   --   --   --   --   --   ALBUMIN 2.7*  --   --   --   --   --   --   --   CRP  --  1.7*  --   --   --  4.4* 8.6* 13.3*  PROCALCITON  --  <0.10  --   --   --  <0.10 0.13 0.12  BNP  --   --   --   --  110.8* 128.8* 125.2* 114.5*  MG  --   --  1.7  --   --  1.5* 1.9 1.6*  CALCIUM 8.6*  --   --  8.6* 9.3 9.1 9.0 8.7*      Recent Labs  Lab 10/11/22 1123 10/11/22 1709 10/12/22 0559 10/13/22 0013 10/14/22 0053 10/15/22 0235 10/16/22 0711  CRP 1.7*  --   --   --  4.4*  8.6* 13.3*  PROCALCITON <0.10  --   --   --  <0.10 0.13 0.12  BNP  --   --   --  110.8* 128.8* 125.2* 114.5*  MG  --  1.7  --   --  1.5* 1.9 1.6*  CALCIUM  --   --  8.6* 9.3 9.1 9.0 8.7*    Micro Results Recent Results (from the past 240 hour(s))  Urine Culture     Status: Abnormal   Collection Time: 10/11/22  1:45 PM   Specimen: Urine, Clean Catch  Result Value Ref Range Status   Specimen  Description URINE, CLEAN CATCH  Final   Special Requests   Final    NONE Performed at [facility], [address] [place], [place] [address]    Culture >=100,000 COLONIES/mL KLEBSIELLA PNEUMONIAE (A)  Final   Report Status 10/13/2022 FINAL  Final   Organism ID, Bacteria KLEBSIELLA PNEUMONIAE (A)  Final      Susceptibility   Klebsiella pneumoniae - MIC*    AMPICILLIN RESISTANT Resistant     CEFAZOLIN <=4 SENSITIVE Sensitive     CEFEPIME <=0.12 SENSITIVE Sensitive     CEFTRIAXONE <=0.25 SENSITIVE Sensitive     CIPROFLOXACIN <=0.25 SENSITIVE Sensitive     GENTAMICIN <=1 SENSITIVE Sensitive     IMIPENEM <=0.25 SENSITIVE Sensitive     NITROFURANTOIN 64 INTERMEDIATE Intermediate     TRIMETH/SULFA <=20 SENSITIVE Sensitive     AMPICILLIN/SULBACTAM 4 SENSITIVE Sensitive     PIP/TAZO <=4 SENSITIVE Sensitive     * >=100,000 COLONIES/mL KLEBSIELLA PNEUMONIAE    Radiology Reports CT ABDOMEN PELVIS WO CONTRAST  Result Date: 10/13/2022 CLINICAL DATA:  Abdominal pain and swelling. Bowel obstruction suspected. EXAM: CT ABDOMEN AND PELVIS WITHOUT CONTRAST TECHNIQUE: Multidetector CT imaging of the abdomen and pelvis was performed following the standard protocol without IV contrast. RADIATION DOSE REDUCTION: This exam was performed according to the departmental dose-optimization program which includes automated exposure control, adjustment of the mA and/or kV according to patient size and/or use of iterative reconstruction technique. COMPARISON:  September 15, 2022 FINDINGS: Lower chest: Bibasilar atelectasis versus airspace consolidation. Hepatobiliary: No focal liver abnormality is seen. Status post cholecystectomy. No biliary dilatation. Pancreas: Unremarkable. No pancreatic ductal dilatation or surrounding inflammatory changes. Spleen: Normal in size without focal abnormality. Adrenals/Urinary Tract: Adrenal glands are unremarkable. Kidneys are normal, without renal calculi, focal lesion, or  hydronephrosis. Bladder is unremarkable. Stomach/Bowel: Normal appearance of the stomach and small bowel. There is gas and fluid distension of the rectum and sigmoid colon which transitions to normal caliber descending and transverse colon. Known atomic obstruction is identified. Left colonic diverticulosis. Vascular/Lymphatic: Aortic atherosclerosis. No enlarged abdominal or pelvic lymph nodes. Reproductive: Uterus and bilateral adnexa are unremarkable. Other: No abdominal wall hernia or abnormality. No abdominopelvic ascites. Musculoskeletal: Osteopenia. Spondylosis and scoliosis of the lumbosacral spine.Subcutaneous stranding and thickening of the abdominal musculature in the left central abdomen/flank, image 33/89, series 3. Small amount of subcutaneous gas is also seen in this area, image 43/89, series 3. IMPRESSION: 1. Gas and fluid distension of the rectum and sigmoid colon which transitions to normal caliber descending and transverse colon. No definite atomic obstruction is identified. 2. Left colonic diverticulosis without evidence of acute diverticulitis. 3. Subcutaneous stranding and thickening of the abdominal musculature in the left central abdomen/flank. Small amount of subcutaneous gas is also seen in this area. This may represent focal hematoma or cellulitis/myositis. 4. Bibasilar atelectasis versus airspace consolidation. 5. Aortic atherosclerosis. Aortic  Atherosclerosis (ICD10-I70.0). Electronically Signed   By: Ted Mcalpine M.D.   On: 10/13/2022 11:34   DG Abd 1 View  Result Date: 10/13/2022 CLINICAL DATA:  89 year old female with ileus. EXAM: ABDOMEN - 1 VIEW COMPARISON:  Abdominal radiograph 10/11/2022. FINDINGS: Multiple prominent gas-filled loops of small bowel and colon are again noted. Gaseous distention of the sigmoid colon measuring up to 10 cm in diameter. No pneumoperitoneum. IMPRESSION: 1. Nonspecific bowel gas pattern, which may again suggest an ileus, very similar to the  prior examination. Electronically Signed   By: Trudie Reed M.D.   On: 10/13/2022 09:00   DG Chest Port 1 View  Result Date: 10/13/2022 CLINICAL DATA:  [id number] with shortness of breath, wheezing and COPD. EXAM: PORTABLE CHEST 1 VIEW COMPARISON:  Portable chest 10/11/2022 at 5:16 p.m. FINDINGS: 6:26 a.m. The lungs are clear. No pleural effusion is seen. The cardiomediastinal silhouette and vascular pattern are normal. There is calcific plaque in the aortic arch. Advanced thoracic spondylosis and advanced right shoulder DJD. IMPRESSION: No evidence of acute chest disease.  Aortic atherosclerosis. Electronically Signed   By: Almira Bar M.D.   On: 10/13/2022 06:57      Signature  -   Huey Bienenstock M.D on 10/16/2022 at 2:33 PM   -  To page go to [email or web address]

## 2022-10-16 NOTE — Plan of Care (Signed)

## 2022-10-16 NOTE — Progress Notes (Signed)
Nutrition Follow-up  DOCUMENTATION CODES:   Not applicable  INTERVENTION:  Encourage good PO intake  NUTRITION DIAGNOSIS:   Inadequate oral intake related to inability to eat as evidenced by NPO status. - Progressing, diet now advanced   GOAL:   Patient will meet greater than or equal to 90% of their needs - Ongoing  MONITOR:   PO intake, Labs, I & O's  REASON FOR ASSESSMENT:   Consult Assessment of nutrition requirement/status  ASSESSMENT:   89 y.o. female presented with altered mental status. PMH includes COPD, GERD, CAD, and HTN. Pt admitted with acute metabolic encephalopathy, UTI, and hypokalemia.   8/18 - Admitted 8/20 - NPO 8/22 - diet advanced to Dysphagia 1   Discussed pt with MD and RN. Pt holding meds in mouth and requiring suctioning. Not as awake and alert as yesterday. Ongoing GOC conversations.   Medications reviewed and include: Vitamin D3, Pepcid, Melatonin, Protonix, Senokot-S, IV antibiotics  Labs reviewed: Sodium 135, Potassium 3.0, BUN 6, Creatinine 0.78, Magnesium 1.6  Diet Order:   Diet Order             DIET - DYS 1 Room service appropriate? No; Fluid consistency: Thin  Diet effective now                   EDUCATION NEEDS:   No education needs have been identified at this time  Skin:  Skin Assessment: Skin Integrity Issues: Skin Integrity Issues:: Stage I Stage I: Coccyx  Last BM:  8/23 - Type 7, large  Height:  Ht Readings from Last 1 Encounters:  10/11/22 5\' 5"  (1.651 m)   Weight:  Wt Readings from Last 1 Encounters:  10/16/22 85.2 kg   Ideal Body Weight:  56.8 kg  BMI:  Body mass index is 31.26 kg/m.  Estimated Nutritional Needs:  Kcal:  1650-1850 Protein:  80-100 grams Fluid:  >/= 1.6 L   Kirby Crigler RD, LDN Clinical Dietitian See [facility] for contact information.

## 2022-10-17 DIAGNOSIS — G9341 Metabolic encephalopathy: Secondary | ICD-10-CM | POA: Diagnosis not present

## 2022-10-17 DIAGNOSIS — N390 Urinary tract infection, site not specified: Secondary | ICD-10-CM | POA: Diagnosis not present

## 2022-10-17 NOTE — TOC Progression Note (Signed)
Transition of Care [facility]) - Progression Note    Patient Details  Name: Leslie Farley MRN: [id number] Date of Birth: April 22, 1930  [facility] of [facility]) CM/SW Contact  Carmina Miller, LCSWA Phone Number: 10/17/2022, 11:22 AM  Clinical Narrative:     CSW received notification from MD to have hospice follow at [facility]). CSW spoke with pt's granddaughter [name], she states pt currently receives palliative services through [facility] and requests they provide hospice services. CSW notified [facility] liaison for review. TOC will continue to follow.         Expected Discharge Plan and Services                                               Social Determinants of Health (SDOH) Interventions SDOH Screenings   Food Insecurity: No Food Insecurity (09/15/2022)  Housing: Low Risk  (09/15/2022)  Transportation Needs: No Transportation Needs (09/15/2022)  Utilities: Not At Risk (09/15/2022)  Tobacco Use: Medium Risk (10/11/2022)    Readmission Risk Interventions    03/31/2022    1:08 PM  Readmission Risk Prevention Plan  Post Dischage Appt Complete  Medication Screening Complete  Transportation Screening Complete

## 2022-10-17 NOTE — Progress Notes (Signed)
PROGRESS NOTE                                                                                                                                                                                                             Patient Demographics:    Leslie Farley, is a 89 y.o. female, DOB - 07-02-1930, JXB:[id number]  Outpatient Primary MD for the patient is Pcp, No    LOS - 6  Admit date - 10/11/2022    Chief Complaint  Patient presents with   Altered Mental Status       Brief Narrative (HPI from H&P)     89 y.o. [place] speaking female with dementia hypertension, COPD/asthma, anemia, arthritis, GERD who presents after being noted to be acutely altered 8/18 morning.  She had just recently been admitted into the hospital from 7/23-7/30 with acute hypoxic respiratory failure with abdominal pain secondary to colonic ileus. History is limited from the patient and her daughter over the phone.It was reported that she has had decreased responsiveness  and didn't want to eat breakfast. Over the he last couple months she has had a progressive decline in her mental status.  The ER workup suggestive of UTI along with possible reoccurrence of ileus and she was admitted to the hospital.   Subjective:    Leslie Farley she is more awake today, she had few bites from her breakfast today.     Assessment  & Plan :    Acute metabolic encephalopathy due to Klebsiella UTI, present on admission -As well patient with known underlying dementia -Patient presents with failure to thrive, worsening mentation is most likely due to acute UTI, but with significant underlying dementia - treated  with IV Rocephin, narrowed to cefazolin.   - continue supportive care. - PT OT and speech consulted.   -Continue with current management, no escalation of care. -Her mentation significantly fluctuate, and this alters her oral intake, yesterday she was  pretty somnolent with no oral intake, this morning she is more awake, able to tolerate fruit bites with her breakfast, will discuss with her facility of the able to provide hospice care, she is already been followed by palliative medicine there.    Recurrent colonic ileus  -Patient with abdominal distention, repeat CT abdomen pelvis confirming colonic ileus  -She is having good bowel movements for now,  will continue with laxatives on as-needed basis only.-No nausea, no vomiting.    Advance underlying dementia.   -Supportive care.  At risk for delirium.  Minimize narcotics and benzodiazepines.  Anemia of chronic disease.  Stable.  Hypokalemia.  Replaced.  Hypomagnesemia -Replaced  HX of asthma and COPD.   Continue with as needed nebs.    Hypertension.  On Norvasc, and as needed hydralazine.       Condition - Extremely Guarded  Family Communication  : Discussed with daughter at bedside 8/23  Code Status :  DNR  Consults  :    PUD Prophylaxis :  PPI   Procedures  :     CT Abd Pelvis -       Disposition Plan  : He is long[facility]term at [facility] [place] [facility], likely will DC to [facility], Child psychotherapist is following to see if they can provide hospice at facility.  Status is: Inpatient  DVT Prophylaxis  :    enoxaparin (LOVENOX) injection 40 mg Start: 10/11/22 2200    Lab Results  Component Value Date   PLT 209 10/16/2022    Diet :  Diet Order             DIET - DYS 1 Room service appropriate? No; Fluid consistency: Thin  Diet effective now                    Inpatient Medications  Scheduled Meds:  amLODipine  10 mg Oral Daily   cholecalciferol  2,000 Units Oral Daily   cycloSPORINE  1 drop Both Eyes BID   enoxaparin (LOVENOX) injection  40 mg Subcutaneous Q24H   famotidine  20 mg Oral Daily   ipratropium-albuterol  3 mL Nebulization BID   melatonin  6 mg Oral QHS   mometasone-formoterol  2 puff Inhalation BID   pantoprazole  40 mg Oral Daily   polyvinyl  alcohol  1 drop Both Eyes BID   QUEtiapine  25 mg Oral QHS   senna-docusate  2 tablet Oral QHS   sodium chloride flush  3 mL Intravenous Q12H   Continuous Infusions:   PRN Meds:.acetaminophen **OR** acetaminophen, bisacodyl, hydrALAZINE, ipratropium-albuterol, ondansetron **OR** ondansetron (ZOFRAN) IV  Antibiotics  :    Anti-infectives (From admission, onward)    Start     Dose/Rate Route Frequency Ordered Stop   10/14/22 1400  ceFAZolin (ANCEF) IVPB 1 g/50 mL premix        1 g 100 mL/hr over 30 Minutes Intravenous Every 8 hours 10/14/22 1018 10/16/22 2332   10/12/22 1500  cefTRIAXone (ROCEPHIN) 1 g in sodium chloride 0.9 % 100 mL IVPB  Status:  Discontinued        1 g 200 mL/hr over 30 Minutes Intravenous Every 24 hours 10/11/22 1558 10/14/22 1018   10/11/22 1345  cefTRIAXone (ROCEPHIN) 1 g in sodium chloride 0.9 % 100 mL IVPB        1 g 200 mL/hr over 30 Minutes Intravenous  Once 10/11/22 1344 10/11/22 1457         Objective:   Vitals:   10/17/22 0500 10/17/22 0600 10/17/22 0754 10/17/22 0800  BP: (!) 154/61 (!) 162/67  (!) 154/62  Pulse: 78 80  80  Resp: (!) 32 20 (!) 22   Temp:    98.5 F (36.9 C)  TempSrc:    Oral  SpO2: 100% 100%  93%  Weight:      Height:        Wt Readings  from Last 3 Encounters:  10/16/22 85.2 kg  09/15/22 82.4 kg  03/30/22 96.7 kg     Intake/Output Summary (Last 24 hours) at 10/17/2022 1234 Last data filed at 10/17/2022 0800 Gross per 24 hour  Intake 918.52 ml  Output 300 ml  Net 618.52 ml     Physical Exam   Is more awake and communicative today, but she is frail, chronically ill-appearing Symmetrical Chest wall movement, Good air movement bilaterally, CTAB RRR,No Gallops,Rubs or new Murmurs, No Parasternal Heave +ve B.Sounds, Abd Soft, No tenderness, No rebound - guarding or rigidity. No Cyanosis, Clubbing or edema, No new Rash or bruise         Data Review:    Recent Labs  Lab 10/12/22 0559 10/13/22 0013  10/14/22 0053 10/15/22 0235 10/16/22 0711  WBC 5.0 6.2 6.5 8.0 9.2  HGB 9.1* 9.6* 9.2* 9.5* 8.7*  HCT 28.5* 30.0* 28.5* 28.4* 27.1*  PLT 226 252 243 242 209  MCV 97.3 98.4 94.7 95.9 95.1  MCH 31.1 31.5 30.6 32.1 30.5  MCHC 31.9 32.0 32.3 33.5 32.1  RDW 14.0 13.9 13.9 13.8 13.8  LYMPHSABS  --   --  0.6* 0.9 1.1  MONOABS  --   --  0.5 0.6 0.9  EOSABS  --   --  0.2 0.3 0.2  BASOSABS  --   --  0.0 0.0 0.0    Recent Labs  Lab 10/11/22 1113 10/11/22 1123 10/11/22 1709 10/12/22 0559 10/13/22 0013 10/14/22 0053 10/15/22 0235 10/16/22 0711  NA 143  --   --  139 140 139 135 135  K 2.9*  --   --  3.1* 3.4* 3.3* 3.2* 3.0*  CL 104  --   --  102 100 101 98 99  CO2 28  --   --  28 28 27 25 26   ANIONGAP 11  --   --  9 12 11 12 10   GLUCOSE 99  --   --  82 119* 116* 86 88  BUN 14  --   --  9 12 8  7* 6*  CREATININE 0.88  --   --  0.89 0.69 0.59 0.69 0.78  AST 18  --   --   --   --   --   --   --   ALT 12  --   --   --   --   --   --   --   ALKPHOS 49  --   --   --   --   --   --   --   BILITOT 0.2*  --   --   --   --   --   --   --   ALBUMIN 2.7*  --   --   --   --   --   --   --   CRP  --  1.7*  --   --   --  4.4* 8.6* 13.3*  PROCALCITON  --  <0.10  --   --   --  <0.10 0.13 0.12  BNP  --   --   --   --  110.8* 128.8* 125.2* 114.5*  MG  --   --  1.7  --   --  1.5* 1.9 1.6*  CALCIUM 8.6*  --   --  8.6* 9.3 9.1 9.0 8.7*      Recent Labs  Lab 10/11/22 1123 10/11/22 1709 10/12/22 0559 10/13/22 0013 10/14/22 0053 10/15/22 0235 10/16/22  0711  CRP 1.7*  --   --   --  4.4* 8.6* 13.3*  PROCALCITON <0.10  --   --   --  <0.10 0.13 0.12  BNP  --   --   --  110.8* 128.8* 125.2* 114.5*  MG  --  1.7  --   --  1.5* 1.9 1.6*  CALCIUM  --   --  8.6* 9.3 9.1 9.0 8.7*    Micro Results Recent Results (from the past 240 hour(s))  Urine Culture     Status: Abnormal   Collection Time: 10/11/22  1:45 PM   Specimen: Urine, Clean Catch  Result Value Ref Range Status   Specimen Description  URINE, CLEAN CATCH  Final   Special Requests   Final    NONE Performed at [facility], [address] [place], [place] [address]    Culture >=100,000 COLONIES/mL KLEBSIELLA PNEUMONIAE (A)  Final   Report Status 10/13/2022 FINAL  Final   Organism ID, Bacteria KLEBSIELLA PNEUMONIAE (A)  Final      Susceptibility   Klebsiella pneumoniae - MIC*    AMPICILLIN RESISTANT Resistant     CEFAZOLIN <=4 SENSITIVE Sensitive     CEFEPIME <=0.12 SENSITIVE Sensitive     CEFTRIAXONE <=0.25 SENSITIVE Sensitive     CIPROFLOXACIN <=0.25 SENSITIVE Sensitive     GENTAMICIN <=1 SENSITIVE Sensitive     IMIPENEM <=0.25 SENSITIVE Sensitive     NITROFURANTOIN 64 INTERMEDIATE Intermediate     TRIMETH/SULFA <=20 SENSITIVE Sensitive     AMPICILLIN/SULBACTAM 4 SENSITIVE Sensitive     PIP/TAZO <=4 SENSITIVE Sensitive     * >=100,000 COLONIES/mL KLEBSIELLA PNEUMONIAE    Radiology Reports No results found.    Signature  -   Huey Bienenstock M.D on 10/17/2022 at 12:34 PM   -  To page go to [email or web address]

## 2022-10-18 DIAGNOSIS — G9341 Metabolic encephalopathy: Secondary | ICD-10-CM | POA: Diagnosis not present

## 2022-10-18 MED ORDER — POTASSIUM CHLORIDE 20 MEQ PO PACK
40.0000 meq | PACK | Freq: Once | ORAL | Status: AC
  Administered 2022-10-18: 40 meq via ORAL
  Filled 2022-10-18: qty 2

## 2022-10-18 MED ORDER — POTASSIUM CHLORIDE 10 MEQ/100ML IV SOLN
10.0000 meq | INTRAVENOUS | Status: AC
  Administered 2022-10-18 (×3): 10 meq via INTRAVENOUS
  Filled 2022-10-18 (×3): qty 100

## 2022-10-18 NOTE — Progress Notes (Signed)
PROGRESS NOTE                                                                                                                                                                                                             Patient Demographics:    Leslie Farley, is a 89 y.o. female, DOB - 06/06/1930, ZOX:[id number]  Outpatient Primary MD for the patient is Pcp, No    LOS - 7  Admit date - 10/11/2022    Chief Complaint  Patient presents with   Altered Mental Status       Brief Narrative (HPI from H&P)     89 y.o. [place] speaking female with dementia hypertension, COPD/asthma, anemia, arthritis, GERD who presents after being noted to be acutely altered 8/18 morning.  She had just recently been admitted into the hospital from 7/23-7/30 with acute hypoxic respiratory failure with abdominal pain secondary to colonic ileus. History is limited from the patient and her daughter over the phone.It was reported that she has had decreased responsiveness  and didn't want to eat breakfast. Over the he last couple months she has had a progressive decline in her mental status.  The ER workup suggestive of UTI along with possible reoccurrence of ileus and she was admitted to the hospital.   Subjective:    Leslie Farley with no significant events overnight, more somnolent today, unable to provide any complaints, she did not do well at all with breakfast this morning.   Assessment  & Plan :    Acute metabolic encephalopathy due to Klebsiella UTI, present on admission -As well patient with known underlying dementia -Patient presents with failure to thrive, worsening mentation is most likely due to acute UTI, but with significant underlying dementia - treated  with IV Rocephin, narrowed to cefazolin.   - continue supportive care. - PT OT and speech consulted.   -Continue with current management, no escalation of care. -Her  mentation significantly fluctuate, and this alters her oral intake, at times she is awake with fair intake, some other days very poor oral intake, oral intake is unreliable, discussed with her daughter, plan to discharge back to facility with hospice tomorrow    Recurrent colonic ileus  -Patient with abdominal distention, repeat CT abdomen pelvis confirming colonic ileus  -She is having good bowel movements for now, will continue with  laxatives on as-needed basis only.-No nausea, no vomiting.    Advance underlying dementia.   -Supportive care.  At risk for delirium.  Minimize narcotics and benzodiazepines.  Anemia of chronic disease.  Stable.  Hypokalemia.  Replaced.  Hypomagnesemia -Replaced  HX of asthma and COPD.   Continue with as needed nebs.    Hypertension.  On Norvasc, and as needed hydralazine.       Condition - Extremely Guarded  Family Communication  : Discussed with daughter at bedside 8/23 and by phone 8/25.  Code Status :  DNR  Consults  :    PUD Prophylaxis :  PPI   Procedures  :     CT Abd Pelvis -       Disposition Plan  : He is long-term at [facility], likely will DC to [facility], Child psychotherapist is following to see if they can provide hospice at facility.  Status is: Inpatient  DVT Prophylaxis  :    enoxaparin (LOVENOX) injection 40 mg Start: 10/11/22 2200    Lab Results  Component Value Date   PLT 209 10/16/2022    Diet :  Diet Order             DIET - DYS 1 Room service appropriate? No; Fluid consistency: Thin  Diet effective now                    Inpatient Medications  Scheduled Meds:  amLODipine  10 mg Oral Daily   cholecalciferol  2,000 Units Oral Daily   cycloSPORINE  1 drop Both Eyes BID   enoxaparin (LOVENOX) injection  40 mg Subcutaneous Q24H   famotidine  20 mg Oral Daily   ipratropium-albuterol  3 mL Nebulization BID   melatonin  6 mg Oral QHS   mometasone-formoterol  2 puff Inhalation BID   pantoprazole  40  mg Oral Daily   polyvinyl alcohol  1 drop Both Eyes BID   QUEtiapine  25 mg Oral QHS   senna-docusate  2 tablet Oral QHS   sodium chloride flush  3 mL Intravenous Q12H   Continuous Infusions:  potassium chloride 10 mEq (10/18/22 1222)    PRN Meds:.acetaminophen **OR** acetaminophen, bisacodyl, hydrALAZINE, ipratropium-albuterol, ondansetron **OR** ondansetron (ZOFRAN) IV  Antibiotics  :    Anti-infectives (From admission, onward)    Start     Dose/Rate Route Frequency Ordered Stop   10/14/22 1400  ceFAZolin (ANCEF) IVPB 1 g/50 mL premix        1 g 100 mL/hr over 30 Minutes Intravenous Every 8 hours 10/14/22 1018 10/16/22 2332   10/12/22 1500  cefTRIAXone (ROCEPHIN) 1 g in sodium chloride 0.9 % 100 mL IVPB  Status:  Discontinued        1 g 200 mL/hr over 30 Minutes Intravenous Every 24 hours 10/11/22 1558 10/14/22 1018   10/11/22 1345  cefTRIAXone (ROCEPHIN) 1 g in sodium chloride 0.9 % 100 mL IVPB        1 g 200 mL/hr over 30 Minutes Intravenous  Once 10/11/22 1344 10/11/22 1457         Objective:   Vitals:   10/18/22 0400 10/18/22 0800 10/18/22 0900 10/18/22 1200  BP: (!) 147/63 (!) 140/66  136/61  Pulse:  78  70  Resp: 18 17    Temp: 99.1 F (37.3 C) 98.9 F (37.2 C)    TempSrc: Oral Oral    SpO2: 96% 98% 97% 97%  Weight:      Height:  Wt Readings from Last 3 Encounters:  10/16/22 85.2 kg  09/15/22 82.4 kg  03/30/22 96.7 kg    No intake or output data in the 24 hours ending 10/18/22 1259    Physical Exam   She is somnolent, nonresponsive, does not open her eyes or answer any questions. Symmetrical Chest wall movement, Good air movement bilaterally, CTAB RRR,No Gallops,Rubs or new Murmurs, No Parasternal Heave +ve B.Sounds, Abd Soft, umbilical ileus,  bowel sounds present No Cyanosis, Clubbing or edema, No new Rash or bruise        Data Review:    Recent Labs  Lab 10/12/22 0559 10/13/22 0013 10/14/22 0053 10/15/22 0235 10/16/22 0711   WBC 5.0 6.2 6.5 8.0 9.2  HGB 9.1* 9.6* 9.2* 9.5* 8.7*  HCT 28.5* 30.0* 28.5* 28.4* 27.1*  PLT 226 252 243 242 209  MCV 97.3 98.4 94.7 95.9 95.1  MCH 31.1 31.5 30.6 32.1 30.5  MCHC 31.9 32.0 32.3 33.5 32.1  RDW 14.0 13.9 13.9 13.8 13.8  LYMPHSABS  --   --  0.6* 0.9 1.1  MONOABS  --   --  0.5 0.6 0.9  EOSABS  --   --  0.2 0.3 0.2  BASOSABS  --   --  0.0 0.0 0.0    Recent Labs  Lab 10/11/22 1709 10/12/22 0559 10/13/22 0013 10/14/22 0053 10/15/22 0235 10/16/22 0711  NA  --  139 140 139 135 135  K  --  3.1* 3.4* 3.3* 3.2* 3.0*  CL  --  102 100 101 98 99  CO2  --  28 28 27 25 26   ANIONGAP  --  9 12 11 12 10   GLUCOSE  --  82 119* 116* 86 88  BUN  --  9 12 8  7* 6*  CREATININE  --  0.89 0.69 0.59 0.69 0.78  CRP  --   --   --  4.4* 8.6* 13.3*  PROCALCITON  --   --   --  <0.10 0.13 0.12  BNP  --   --  110.8* 128.8* 125.2* 114.5*  MG 1.7  --   --  1.5* 1.9 1.6*  CALCIUM  --  8.6* 9.3 9.1 9.0 8.7*      Recent Labs  Lab 10/11/22 1709 10/12/22 0559 10/13/22 0013 10/14/22 0053 10/15/22 0235 10/16/22 0711  CRP  --   --   --  4.4* 8.6* 13.3*  PROCALCITON  --   --   --  <0.10 0.13 0.12  BNP  --   --  110.8* 128.8* 125.2* 114.5*  MG 1.7  --   --  1.5* 1.9 1.6*  CALCIUM  --  8.6* 9.3 9.1 9.0 8.7*    Micro Results Recent Results (from the past 240 hour(s))  Urine Culture     Status: Abnormal   Collection Time: 10/11/22  1:45 PM   Specimen: Urine, Clean Catch  Result Value Ref Range Status   Specimen Description URINE, CLEAN CATCH  Final   Special Requests   Final    NONE Performed at [facility], [address], [place], [place] [address]    Culture >=100,000 COLONIES/mL KLEBSIELLA PNEUMONIAE (A)  Final   Report Status 10/13/2022 FINAL  Final   Organism ID, Bacteria KLEBSIELLA PNEUMONIAE (A)  Final      Susceptibility   Klebsiella pneumoniae - MIC*    AMPICILLIN RESISTANT Resistant     CEFAZOLIN <=4 SENSITIVE Sensitive     CEFEPIME <=0.12 SENSITIVE Sensitive  CEFTRIAXONE <=0.25 SENSITIVE Sensitive     CIPROFLOXACIN <=0.25 SENSITIVE Sensitive     GENTAMICIN <=1 SENSITIVE Sensitive     IMIPENEM <=0.25 SENSITIVE Sensitive     NITROFURANTOIN 64 INTERMEDIATE Intermediate     TRIMETH/SULFA <=20 SENSITIVE Sensitive     AMPICILLIN/SULBACTAM 4 SENSITIVE Sensitive     PIP/TAZO <=4 SENSITIVE Sensitive     * >=100,000 COLONIES/mL KLEBSIELLA PNEUMONIAE    Radiology Reports No results found.    Signature  -   Huey Bienenstock M.D on 10/18/2022 at 12:59 PM   -  To page go to [email or web address]

## 2022-10-18 NOTE — Plan of Care (Signed)

## 2022-10-19 DIAGNOSIS — R319 Hematuria, unspecified: Secondary | ICD-10-CM | POA: Diagnosis not present

## 2022-10-19 DIAGNOSIS — N390 Urinary tract infection, site not specified: Secondary | ICD-10-CM | POA: Diagnosis not present

## 2022-10-19 DIAGNOSIS — G9341 Metabolic encephalopathy: Secondary | ICD-10-CM | POA: Diagnosis not present

## 2022-10-19 NOTE — Discharge Summary (Addendum)
Physician Discharge Summary  Rai Repasky ZHY:[id number] DOB: 22-Nov-1930 DOA: 10/11/2022  PCP: Pcp, No  Admit date: 10/11/2022 Discharge date: 10/19/2022  Admitted From: (SNF) Disposition:  (SNF with Hospice)  Recommendations for Outpatient Follow-up:  Management per hospice, overall poor prognosis anticipated, feeding for comfort, she is on dysphagia 1 diet with thin liquid, please avoid patient returning to the hospital if she has again poor oral intake, family has picked up MOST form, and they will fill at the facility with assistance of hospice/palliative medicine.   Discharge Condition: (hospice) CODE STATUS: (DNR) Diet recommendation:  Dysphagia 1 with thin liquid  Brief/Interim Summary:  89 y.o. [place] speaking female with dementia hypertension, COPD/asthma, anemia, arthritis, GERD who presents after being noted to be acutely altered 8/18 morning.  She had just recently been admitted into the hospital from 7/23-7/30 with acute hypoxic respiratory failure with abdominal pain secondary to colonic ileus. History is limited from the patient and her daughter over the phone.It was reported that she has had decreased responsiveness  and didn't want to eat breakfast. Over the he last couple months she has had a progressive decline in her mental status.   The ER workup suggestive of UTI along with possible reoccurrence of ileus and she was admitted to the hospital.  Her UTI was treated with appropriate antibiotics, and regarding colonic ileus, she never had any signs of obstruction, no nausea, no vomiting, she is with good BM during hospital stay, but overall patient mentation seems to be fluctuating, with unreliable oral intake, and some difficulty swallowing, patient is being followed by palliative medicine in the facility, have discussed with daughter, her prognosis is anticipated, and plan to discharge back to the facility with hospice.   Acute metabolic encephalopathy due to  Klebsiella UTI, present on admission -As well patient with known underlying dementia -Patient presents with failure to thrive, worsening mentation is most likely due to acute UTI, but with significant underlying dementia - Treated  with IV Rocephin, narrowed to cefazolin.  No further antibiotics at time of discharge -Her mentation significantly fluctuate, and this alters her oral intake, at times she is awake with fair intake, but more often she is somnolent, with very poor oral intake, even refusing to take her meds, but in general oral intake is unreliable, discussed with the daughter, patient remains with poor mentation, and poor oral input despite treating her UTI and all other reversible condition, this is most likely due progression of her dementia, at this point decision has been made to discharge patient back to the facility under hospice care.     Recurrent colonic ileus  -On physical exam she is with mildly distended abdomen, and paraumbilical hernia, but does not have any signs of obstruction, she is having good BMs, no nausea, no vomiting, no abdominal pain or tenderness .   Advance underlying dementia.   -Supportive care.  At risk for delirium.   Anemia of chronic disease.  Stable.   Hypokalemia.  Replaced.   Hypomagnesemia -Replaced   HX of asthma and COPD.   Continue with as needed nebs.     Hypertension.  On Norvasc   Discharge Diagnoses:  Principal Problem:   Acute metabolic encephalopathy Active Problems:   Urinary tract infection   Hypokalemia   Normocytic anemia   Chronic constipation   Ileus (HCC)   COPD (chronic obstructive pulmonary disease) (HCC)   Essential hypertension   Dementia [facility])    Discharge Instructions  Discharge Instructions  Diet - low sodium heart healthy   Complete by: As directed    Discharge instructions   Complete by: As directed    Follow with hospice/SNF physician     Disposition SNF/long-term   Diet: Dysphagia 1  diet   On your next visit with your primary care physician please Get Medicines reviewed and adjusted.   Please request your Prim.MD to go over all Hospital Tests and Procedure/Radiological results at the follow up, please get all Hospital records sent to your Prim MD by signing hospital release before you go home.   If you experience worsening of your admission symptoms, develop shortness of breath, life threatening emergency, suicidal or homicidal thoughts you must seek medical attention immediately by calling 911 or calling your MD immediately  if symptoms less severe.  You Must read complete instructions/literature along with all the possible adverse reactions/side effects for all the Medicines you take and that have been prescribed to you. Take any new Medicines after you have completely understood and accpet all the possible adverse reactions/side effects.   Do not drive, operating heavy machinery, perform activities at heights, swimming or participation in water activities or provide baby sitting services if your were admitted for syncope or siezures until you have seen by Primary MD or a Neurologist and advised to do so again.  Do not drive when taking Pain medications.    Do not take more than prescribed Pain, Sleep and Anxiety Medications  Special Instructions: If you have smoked or chewed Tobacco  in the last 2 yrs please stop smoking, stop any regular Alcohol  and or any Recreational drug use.  Wear Seat belts while driving.   Please note  You were cared for by a hospitalist during your hospital stay. If you have any questions about your discharge medications or the care you received while you were in the hospital after you are discharged, you can call the unit and asked to speak with the hospitalist on call if the hospitalist that took care of you is not available. Once you are discharged, your primary care physician will handle any further medical issues. Please note that NO  REFILLS for any discharge medications will be authorized once you are discharged, as it is imperative that you return to your primary care physician (or establish a relationship with a primary care physician if you do not have one) for your aftercare needs so that they can reassess your need for medications and monitor your lab values.   Increase activity slowly   Complete by: As directed    No wound care   Complete by: As directed       Allergies as of 10/19/2022       Reactions   Cucumber Extract         Medication List     TAKE these medications    acetaminophen 500 MG tablet Commonly known as: TYLENOL Take 2 tablets (1,000 mg total) by mouth every 6 (six) hours as needed for mild pain or moderate pain.   Advair Diskus 100-50 MCG/ACT Aepb Generic drug: fluticasone-salmeterol Inhale 1 puff into the lungs every 12 (twelve) hours.   albuterol 108 (90 Base) MCG/ACT inhaler Commonly known as: VENTOLIN HFA Inhale 2 puffs into the lungs every 6 (six) hours as needed for wheezing or shortness of breath.   amLODipine 5 MG tablet Commonly known as: NORVASC Take 1 tablet (5 mg total) by mouth daily.   bisacodyl 10 MG suppository Commonly known as: DULCOLAX Place  1 suppository (10 mg total) rectally daily as needed for moderate constipation.   CERTAVITE/ANTIOXIDANTS PO Take 1 tablet by mouth daily.   cycloSPORINE 0.05 % ophthalmic emulsion Commonly known as: RESTASIS Place 1 drop into both eyes 2 (two) times daily.   famotidine 20 MG tablet Commonly known as: PEPCID Take 1 tablet (20 mg total) by mouth daily.   feeding supplement (PRO-STAT SUGAR FREE 64) Liqd Take 30 mLs by mouth in the morning and at bedtime.   FP FIBER LAXATIVE PO Take 500 mg by mouth 2 (two) times daily.   ipratropium-albuterol 0.5-2.5 (3) MG/3ML Soln Commonly known as: DUONEB Take 3 mLs by nebulization every 6 (six) hours as needed.   ipratropium-albuterol 0.5-2.5 (3) MG/3ML Soln Commonly  known as: DUONEB Take 3 mLs by nebulization 2 (two) times daily.   melatonin 3 MG Tabs tablet Take 6 mg by mouth at bedtime.   nitroGLYCERIN 0.4 MG SL tablet Commonly known as: NITROSTAT Place 0.4 mg under the tongue every 5 (five) minutes as needed for chest pain (Do not exceed 3 doses per episode.).   polyethylene glycol 17 g packet Commonly known as: MIRALAX / GLYCOLAX Take 17 g by mouth daily.   QUEtiapine 25 MG tablet Commonly known as: SEROQUEL Take 1 tablet (25 mg total) by mouth at bedtime.   senna-docusate 8.6-50 MG tablet Commonly known as: Senokot-S Take 2 tablets by mouth at bedtime.   Systane Balance 0.6 % Soln Generic drug: Propylene Glycol Place 1 drop into both eyes in the morning and at bedtime.   tiZANidine 2 MG tablet Commonly known as: ZANAFLEX Take 1 tablet (2 mg total) by mouth at bedtime. After completing cipro   Vitamin D 50 MCG (2000 UT) tablet Take 2,000 Units by mouth daily.   Z-Bum 22 % Crea Generic drug: Zinc Oxide Apply 1 Application topically in the morning and at bedtime. Apply to inner thighs and perineal area twice a day.        Allergies  Allergen Reactions   Cucumber Extract     Consultations: None   Procedures/Studies: CT ABDOMEN PELVIS WO CONTRAST  Result Date: 10/13/2022 CLINICAL DATA:  Abdominal pain and swelling. Bowel obstruction suspected. EXAM: CT ABDOMEN AND PELVIS WITHOUT CONTRAST TECHNIQUE: Multidetector CT imaging of the abdomen and pelvis was performed following the standard protocol without IV contrast. RADIATION DOSE REDUCTION: This exam was performed according to the departmental dose-optimization program which includes automated exposure control, adjustment of the mA and/or kV according to patient size and/or use of iterative reconstruction technique. COMPARISON:  September 15, 2022 FINDINGS: Lower chest: Bibasilar atelectasis versus airspace consolidation. Hepatobiliary: No focal liver abnormality is seen. Status  post cholecystectomy. No biliary dilatation. Pancreas: Unremarkable. No pancreatic ductal dilatation or surrounding inflammatory changes. Spleen: Normal in size without focal abnormality. Adrenals/Urinary Tract: Adrenal glands are unremarkable. Kidneys are normal, without renal calculi, focal lesion, or hydronephrosis. Bladder is unremarkable. Stomach/Bowel: Normal appearance of the stomach and small bowel. There is gas and fluid distension of the rectum and sigmoid colon which transitions to normal caliber descending and transverse colon. Known atomic obstruction is identified. Left colonic diverticulosis. Vascular/Lymphatic: Aortic atherosclerosis. No enlarged abdominal or pelvic lymph nodes. Reproductive: Uterus and bilateral adnexa are unremarkable. Other: No abdominal wall hernia or abnormality. No abdominopelvic ascites. Musculoskeletal: Osteopenia. Spondylosis and scoliosis of the lumbosacral spine.Subcutaneous stranding and thickening of the abdominal musculature in the left central abdomen/flank, image 33/89, series 3. Small amount of subcutaneous gas is also seen in this area,  image 43/89, series 3. IMPRESSION: 1. Gas and fluid distension of the rectum and sigmoid colon which transitions to normal caliber descending and transverse colon. No definite atomic obstruction is identified. 2. Left colonic diverticulosis without evidence of acute diverticulitis. 3. Subcutaneous stranding and thickening of the abdominal musculature in the left central abdomen/flank. Small amount of subcutaneous gas is also seen in this area. This may represent focal hematoma or cellulitis/myositis. 4. Bibasilar atelectasis versus airspace consolidation. 5. Aortic atherosclerosis. Aortic Atherosclerosis (ICD10-I70.0). Electronically Signed   By: Ted Mcalpine M.D.   On: 10/13/2022 11:34   DG Abd 1 View  Result Date: 10/13/2022 CLINICAL DATA:  89 year old female with ileus. EXAM: ABDOMEN - 1 VIEW COMPARISON:  Abdominal  radiograph 10/11/2022. FINDINGS: Multiple prominent gas-filled loops of small bowel and colon are again noted. Gaseous distention of the sigmoid colon measuring up to 10 cm in diameter. No pneumoperitoneum. IMPRESSION: 1. Nonspecific bowel gas pattern, which may again suggest an ileus, very similar to the prior examination. Electronically Signed   By: Trudie Reed M.D.   On: 10/13/2022 09:00   DG Chest Port 1 View  Result Date: 10/13/2022 CLINICAL DATA:  [id number] with shortness of breath, wheezing and COPD. EXAM: PORTABLE CHEST 1 VIEW COMPARISON:  Portable chest 10/11/2022 at 5:16 p.m. FINDINGS: 6:26 a.m. The lungs are clear. No pleural effusion is seen. The cardiomediastinal silhouette and vascular pattern are normal. There is calcific plaque in the aortic arch. Advanced thoracic spondylosis and advanced right shoulder DJD. IMPRESSION: No evidence of acute chest disease.  Aortic atherosclerosis. Electronically Signed   By: Almira Bar M.D.   On: 10/13/2022 06:57   DG ABD ACUTE 2+V W 1V CHEST  Result Date: 10/11/2022 CLINICAL DATA:  Abdominal distention, nausea and vomiting. EXAM: DG ABDOMEN ACUTE WITH 1 VIEW CHEST COMPARISON:  Abdominal radiograph dated 09/18/2022 and chest radiograph dated 09/16/2022. FINDINGS: Multiple gas-filled loops of bowel are seen throughout the abdomen. At least a few loops of colon appear distended in the lower midline abdomen, similar to 09/18/2022. No air-fluid levels or free intraperitoneal air are noted. No radiopaque calculi are seen. Heart size and mediastinal contours are within normal limits. Both lungs are clear. IMPRESSION: 1. Multiple gas-filled loops of bowel throughout the abdomen with at least a few loops of colon appearing distended in the lower midline abdomen, similar to 09/18/2022. These findings likely reflect ileus. 2.  No acute pulmonary process. Electronically Signed   By: Romona Curls M.D.   On: 10/11/2022 18:19      Subjective: No significant  events overnight as discussed with staff, she is somnolent unable to provide reliable complaints, she is with poor appetite, oral intake for last couple days.  Discharge Exam: Vitals:   10/19/22 0736 10/19/22 0738  BP:    Pulse:    Resp:    Temp:    SpO2: 96% 96%   Vitals:   10/19/22 0051 10/19/22 0400 10/19/22 0736 10/19/22 0738  BP: (!) 150/69 (!) 149/79    Pulse: 97 82    Resp:      Temp: 98.2 F (36.8 C) 98.5 F (36.9 C)    TempSrc: Oral Oral    SpO2: 93% 96% 96% 96%  Weight:      Height:        General: Patient is somnolent, but she wakes up, does not follow commands or answer any questions, frail, deconditioned Cardiovascular: RRR, S1/S2 +, no rubs, no gallops Respiratory: CTA bilaterally, no wheezing, no rhonchi Abdominal: Soft,  NT, ND, bowel sounds + Extremities: no edema, no cyanosis    The results of significant diagnostics from this hospitalization (including imaging, microbiology, ancillary and laboratory) are listed below for reference.     Microbiology: Recent Results (from the past 240 hour(s))  Urine Culture     Status: Abnormal   Collection Time: 10/11/22  1:45 PM   Specimen: Urine, Clean Catch  Result Value Ref Range Status   Specimen Description URINE, CLEAN CATCH  Final   Special Requests   Final    NONE Performed at [facility], [address], [place], [place] [address]    Culture >=100,000 COLONIES/mL KLEBSIELLA PNEUMONIAE (A)  Final   Report Status 10/13/2022 FINAL  Final   Organism ID, Bacteria KLEBSIELLA PNEUMONIAE (A)  Final      Susceptibility   Klebsiella pneumoniae - MIC*    AMPICILLIN RESISTANT Resistant     CEFAZOLIN <=4 SENSITIVE Sensitive     CEFEPIME <=0.12 SENSITIVE Sensitive     CEFTRIAXONE <=0.25 SENSITIVE Sensitive     CIPROFLOXACIN <=0.25 SENSITIVE Sensitive     GENTAMICIN <=1 SENSITIVE Sensitive     IMIPENEM <=0.25 SENSITIVE Sensitive     NITROFURANTOIN 64 INTERMEDIATE Intermediate     TRIMETH/SULFA <=20  SENSITIVE Sensitive     AMPICILLIN/SULBACTAM 4 SENSITIVE Sensitive     PIP/TAZO <=4 SENSITIVE Sensitive     * >=100,000 COLONIES/mL KLEBSIELLA PNEUMONIAE     Labs: BNP (last 3 results) Recent Labs    10/14/22 0053 10/15/22 0235 10/16/22 0711  BNP 128.8* 125.2* 114.5*   Basic Metabolic Panel: Recent Labs  Lab 10/13/22 0013 10/14/22 0053 10/15/22 0235 10/16/22 0711  NA 140 139 135 135  K 3.4* 3.3* 3.2* 3.0*  CL 100 101 98 99  CO2 28 27 25 26   GLUCOSE 119* 116* 86 88  BUN 12 8 7* 6*  CREATININE 0.69 0.59 0.69 0.78  CALCIUM 9.3 9.1 9.0 8.7*  MG  --  1.5* 1.9 1.6*  PHOS  --   --  2.6  --    Liver Function Tests: No results for input(s): "AST", "ALT", "ALKPHOS", "BILITOT", "PROT", "ALBUMIN" in the last 168 hours. No results for input(s): "LIPASE", "AMYLASE" in the last 168 hours. No results for input(s): "AMMONIA" in the last 168 hours. CBC: Recent Labs  Lab 10/13/22 0013 10/14/22 0053 10/15/22 0235 10/16/22 0711  WBC 6.2 6.5 8.0 9.2  NEUTROABS  --  5.2 6.1 6.9  HGB 9.6* 9.2* 9.5* 8.7*  HCT 30.0* 28.5* 28.4* 27.1*  MCV 98.4 94.7 95.9 95.1  PLT 252 243 242 209   Cardiac Enzymes: No results for input(s): "CKTOTAL", "CKMB", "CKMBINDEX", "TROPONINI" in the last 168 hours. BNP: Invalid input(s): "POCBNP" CBG: No results for input(s): "GLUCAP" in the last 168 hours. D-Dimer No results for input(s): "DDIMER" in the last 72 hours. Hgb A1c No results for input(s): "HGBA1C" in the last 72 hours. Lipid Profile No results for input(s): "CHOL", "HDL", "LDLCALC", "TRIG", "CHOLHDL", "LDLDIRECT" in the last 72 hours. Thyroid function studies No results for input(s): "TSH", "T4TOTAL", "T3FREE", "THYROIDAB" in the last 72 hours.  Invalid input(s): "FREET3" Anemia work up No results for input(s): "VITAMINB12", "FOLATE", "FERRITIN", "TIBC", "IRON", "RETICCTPCT" in the last 72 hours. Urinalysis    Component Value Date/Time   COLORURINE YELLOW 10/11/2022 1154    APPEARANCEUR CLOUDY (A) 10/11/2022 1154   LABSPEC 1.023 10/11/2022 1154   PHURINE 5.0 10/11/2022 1154   GLUCOSEU NEGATIVE 10/11/2022 1154   HGBUR NEGATIVE 10/11/2022 1154  BILIRUBINUR NEGATIVE 10/11/2022 1154   KETONESUR NEGATIVE 10/11/2022 1154   PROTEINUR 30 (A) 10/11/2022 1154   UROBILINOGEN 0.2 11/02/2014 0832   NITRITE NEGATIVE 10/11/2022 1154   LEUKOCYTESUR LARGE (A) 10/11/2022 1154   Sepsis Labs Recent Labs  Lab 10/13/22 0013 10/14/22 0053 10/15/22 0235 10/16/22 0711  WBC 6.2 6.5 8.0 9.2   Microbiology Recent Results (from the past 240 hour(s))  Urine Culture     Status: Abnormal   Collection Time: 10/11/22  1:45 PM   Specimen: Urine, Clean Catch  Result Value Ref Range Status   Specimen Description URINE, CLEAN CATCH  Final   Special Requests   Final    NONE Performed at [facility], [address] [place], [place] [address]    Culture >=100,000 COLONIES/mL KLEBSIELLA PNEUMONIAE (A)  Final   Report Status 10/13/2022 FINAL  Final   Organism ID, Bacteria KLEBSIELLA PNEUMONIAE (A)  Final      Susceptibility   Klebsiella pneumoniae - MIC*    AMPICILLIN RESISTANT Resistant     CEFAZOLIN <=4 SENSITIVE Sensitive     CEFEPIME <=0.12 SENSITIVE Sensitive     CEFTRIAXONE <=0.25 SENSITIVE Sensitive     CIPROFLOXACIN <=0.25 SENSITIVE Sensitive     GENTAMICIN <=1 SENSITIVE Sensitive     IMIPENEM <=0.25 SENSITIVE Sensitive     NITROFURANTOIN 64 INTERMEDIATE Intermediate     TRIMETH/SULFA <=20 SENSITIVE Sensitive     AMPICILLIN/SULBACTAM 4 SENSITIVE Sensitive     PIP/TAZO <=4 SENSITIVE Sensitive     * >=100,000 COLONIES/mL KLEBSIELLA PNEUMONIAE     Time coordinating discharge: Over 30 minutes  SIGNED:   Huey Bienenstock, MD  [facility] 10/19/2022, 9:48 AM Pager   If 7PM-7AM, please contact night-coverage [email or web address]

## 2022-10-19 NOTE — TOC Transition Note (Signed)
Transition of Care [facility]) - CM/SW Discharge Note   Patient Details  Name: Leslie Farley MRN: [id number] Date of Birth: 31-Dec-1930  Transition of Care [facility]) CM/SW Contact:  Mearl Latin, LCSW Phone Number: 10/19/2022, 12:29 PM   Clinical Narrative:    Patient will DC to: [facility] Anticipated DC date: 10/19/22 Family notified: Daughter at bedside Transport by: Sharin Mons   Per MD patient ready for DC to [facility]. RN to call report prior to discharge [phone] room [phone]). RN, patient, patient's family, and facility notified of DC. Discharge Summary and FL2 sent to facility. DC packet on chart including signed DNR. Ambulance transport requested for patient.   CSW will sign off for now as social work intervention is no longer needed. Please consult [place] again if new needs arise.     Final next level of care: Skilled Nursing Facility Barriers to Discharge: Barriers Resolved   Patient Goals and CMS Choice CMS Medicare.gov Compare Post Acute Care list provided to:: Patient Represenative (must comment) (Daughter) Choice offered to / list presented to : Adult Children (Granddaughter)  Discharge Placement     Existing PASRR number confirmed : 10/19/22          Patient chooses bed at: [facility] Patient to be transferred to facility by: [facility] Name of family member notified: [name] Patient and family notified of of transfer: 10/19/22  Discharge Plan and Services Additional resources added to the After Visit Summary for   In-house Referral: Clinical Social Work   Post Acute Care Choice: Skilled Nursing Facility, Hospice                               Social Determinants of Health (SDOH) Interventions SDOH Screenings   Food Insecurity: No Food Insecurity (09/15/2022)  Housing: Low Risk  (09/15/2022)  Transportation Needs: No Transportation Needs (09/15/2022)  Utilities: Not At Risk (09/15/2022)  Tobacco Use: Medium Risk (10/11/2022)      Readmission Risk Interventions    03/31/2022    1:08 PM  Readmission Risk Prevention Plan  Post Dischage Appt Complete  Medication Screening Complete  Transportation Screening Complete

## 2022-10-19 NOTE — Plan of Care (Signed)

## 2022-10-19 NOTE — Discharge Instructions (Signed)
Follow with hospice/SNF physician     Disposition SNF/long-term   Diet: Dysphagia 1 diet   On your next visit with your primary care physician please Get Medicines reviewed and adjusted.   Please request your Prim.MD to go over all Hospital Tests and Procedure/Radiological results at the follow up, please get all Hospital records sent to your Prim MD by signing hospital release before you go home.   If you experience worsening of your admission symptoms, develop shortness of breath, life threatening emergency, suicidal or homicidal thoughts you must seek medical attention immediately by calling 911 or calling your MD immediately  if symptoms less severe.  You Must read complete instructions/literature along with all the possible adverse reactions/side effects for all the Medicines you take and that have been prescribed to you. Take any new Medicines after you have completely understood and accpet all the possible adverse reactions/side effects.   Do not drive, operating heavy machinery, perform activities at heights, swimming or participation in water activities or provide baby sitting services if your were admitted for syncope or siezures until you have seen by Primary MD or a Neurologist and advised to do so again.  Do not drive when taking Pain medications.    Do not take more than prescribed Pain, Sleep and Anxiety Medications  Special Instructions: If you have smoked or chewed Tobacco  in the last 2 yrs please stop smoking, stop any regular Alcohol  and or any Recreational drug use.  Wear Seat belts while driving.   Please note  You were cared for by a hospitalist during your hospital stay. If you have any questions about your discharge medications or the care you received while you were in the hospital after you are discharged, you can call the unit and asked to speak with the hospitalist on call if the hospitalist that took care of you is not available. Once you are  discharged, your primary care physician will handle any further medical issues. Please note that NO REFILLS for any discharge medications will be authorized once you are discharged, as it is imperative that you return to your primary care physician (or establish a relationship with a primary care physician if you do not have one) for your aftercare needs so that they can reassess your need for medications and monitor your lab values.

## 2022-10-19 NOTE — TOC Progression Note (Addendum)
Transition of Care [facility]) - Progression Note    Patient Details  Name: Holliann Mccartt MRN: [id number] Date of Birth: 1930-06-03  Transition of Care [facility]) CM/SW Contact  Mearl Latin, LCSW Phone Number: 10/19/2022, 11:25 AM  Clinical Narrative:    CSW confirmed [facility] is able to accept patient back today with Hospice services. CSW sent referral to [facility] and they will follow patient. CSW spoke with patient's daughter in the room and she reported agreement with [facility].    Expected Discharge Plan: Skilled Nursing Facility Barriers to Discharge: Barriers Resolved  Expected Discharge Plan and Services In-house Referral: Clinical Social Work   Post Acute Care Choice: Skilled Nursing Facility, Hospice Living arrangements for the past 2 months: Skilled Nursing Facility Expected Discharge Date: 10/19/22                                     Social Determinants of Health (SDOH) Interventions SDOH Screenings   Food Insecurity: No Food Insecurity (09/15/2022)  Housing: Low Risk  (09/15/2022)  Transportation Needs: No Transportation Needs (09/15/2022)  Utilities: Not At Risk (09/15/2022)  Tobacco Use: Medium Risk (10/11/2022)    Readmission Risk Interventions    03/31/2022    1:08 PM  Readmission Risk Prevention Plan  Post Dischage Appt Complete  Medication Screening Complete  Transportation Screening Complete

## 2022-10-19 NOTE — NC FL2 (Signed)
[place] MEDICAID FL2 LEVEL OF CARE FORM     IDENTIFICATION  Patient Name: Leslie Farley Birthdate: 11-05-1930 Sex: female Admission Date (Current Location): 10/11/2022  [facility] and [place] Number:  Producer, television/film/video and Address:  [facility], [address], [place], [place] [address]      Provider Number: [id number]  Attending Physician Name and Address:  Elgergawy, Leana Roe, MD  Relative Name and Phone Number:  [name] (daughter) Ph: [phone]    Current Level of Care: Hospital Recommended Level of Care: Skilled Nursing Facility Prior Approval Number:    Date Approved/Denied:   PASRR Number: [id number]  Discharge Plan: SNF    Current Diagnoses: Patient Active Problem List   Diagnosis Date Noted   Acute metabolic encephalopathy 10/11/2022   Urinary tract infection 10/11/2022   Hypokalemia 10/11/2022   Normocytic anemia 10/11/2022   Dementia (HCC) 09/22/2022   Coordination of complex care 09/22/2022   Palliative care encounter 09/20/2022   Goals of care, counseling/discussion 09/20/2022   Abdominal pain 09/16/2022   Leukocytosis 09/16/2022   AKI (acute kidney injury) (HCC) 09/16/2022   Elevated transaminase level 09/16/2022   Ileus (HCC) 09/16/2022   Stercoral colitis 03/29/2022   Fecal impaction (HCC) 03/29/2022   Cognitive impairment 03/29/2022   Chronic bronchitis (HCC) 01/15/2016   Insomnia 01/15/2016   Constipation 01/15/2016   CAD (coronary artery disease) of artery bypass graft 01/15/2016   Benign paroxysmal positional vertigo 01/15/2016   History of pulmonary embolism 01/14/2016   Peripheral neuropathy 06/26/2015   Chronic constipation 05/10/2015   COPD (chronic obstructive pulmonary disease) (HCC) 03/01/2015   Acute encephalopathy 11/10/2014   Right thyroid nodule 11/02/2014   Hypertension    GERD (gastroesophageal reflux disease)    Arthritis    Essential hypertension     Orientation  RESPIRATION BLADDER Height & Weight     Self  Normal Incontinent Weight: 187 lb 13.3 oz (85.2 kg) Height:  5\' 5"  (165.1 cm)  BEHAVIORAL SYMPTOMS/MOOD NEUROLOGICAL BOWEL NUTRITION STATUS      Incontinent Diet (See dc summary)  AMBULATORY STATUS COMMUNICATION OF NEEDS Skin   Extensive Assist Verbally PU Stage and Appropriate Care (Stage I on coccyx)                       Personal Care Assistance Level of Assistance  Bathing, Feeding, Dressing Bathing Assistance: Maximum assistance Feeding assistance: Maximum assistance Dressing Assistance: Maximum assistance     Functional Limitations Info             SPECIAL CARE FACTORS FREQUENCY                       Contractures Contractures Info: Not present    Additional Factors Info  Code Status, Allergies Code Status Info: DNR Allergies Info: Cucumber extract Psychotropic Info: See dc summary         Current Medications (10/19/2022):  This is the current hospital active medication list Current Facility-Administered Medications  Medication Dose Route Frequency Provider Last Rate Last Admin   acetaminophen (TYLENOL) tablet 650 mg  650 mg Oral Q6H PRN Madelyn Flavors A, MD       Or   acetaminophen (TYLENOL) suppository 650 mg  650 mg Rectal Q6H PRN Madelyn Flavors A, MD       amLODipine (NORVASC) tablet 10 mg  10 mg Oral Daily Leroy Sea, MD   10 mg at 10/19/22 0837   bisacodyl (DULCOLAX)  suppository 10 mg  10 mg Rectal Daily PRN Kc, Dayna Barker, MD       cholecalciferol (VITAMIN D3) 25 MCG (1000 UNIT) tablet 2,000 Units  2,000 Units Oral Daily Kc, Ramesh, MD   2,000 Units at 10/19/22 0837   cycloSPORINE (RESTASIS) 0.05 % ophthalmic emulsion 1 drop  1 drop Both Eyes BID Kc, Ramesh, MD   1 drop at 10/19/22 0948   enoxaparin (LOVENOX) injection 40 mg  40 mg Subcutaneous Q24H Smith, Rondell A, MD   40 mg at 10/18/22 2342   famotidine (PEPCID) tablet 20 mg  20 mg Oral Daily Kc, Ramesh, MD   20 mg at 10/19/22 0837    hydrALAZINE (APRESOLINE) injection 10 mg  10 mg Intravenous Q6H PRN Leroy Sea, MD   10 mg at 10/17/22 0327   ipratropium-albuterol (DUONEB) 0.5-2.5 (3) MG/3ML nebulizer solution 3 mL  3 mL Nebulization Q4H PRN Katrinka Blazing, Rondell A, MD   3 mL at 10/12/22 0830   ipratropium-albuterol (DUONEB) 0.5-2.5 (3) MG/3ML nebulizer solution 3 mL  3 mL Nebulization BID Kc, Ramesh, MD   3 mL at 10/19/22 0738   melatonin tablet 6 mg  6 mg Oral QHS Kc, Ramesh, MD   6 mg at 10/18/22 2341   mometasone-formoterol (DULERA) 100-5 MCG/ACT inhaler 2 puff  2 puff Inhalation BID Madelyn Flavors A, MD   2 puff at 10/19/22 0736   ondansetron (ZOFRAN) tablet 4 mg  4 mg Oral Q6H PRN Madelyn Flavors A, MD       Or   ondansetron (ZOFRAN) injection 4 mg  4 mg Intravenous Q6H PRN Smith, Rondell A, MD       pantoprazole (PROTONIX) EC tablet 40 mg  40 mg Oral Daily Leroy Sea, MD   40 mg at 10/19/22 1610   polyvinyl alcohol (LIQUIFILM TEARS) 1.4 % ophthalmic solution 1 drop  1 drop Both Eyes BID Kc, Ramesh, MD   1 drop at 10/19/22 0838   QUEtiapine (SEROQUEL) tablet 25 mg  25 mg Oral QHS Smith, Rondell A, MD   25 mg at 10/18/22 2341   senna-docusate (Senokot-S) tablet 2 tablet  2 tablet Oral QHS Smith, Rondell A, MD   2 tablet at 10/18/22 2341   sodium chloride flush (NS) 0.9 % injection 3 mL  3 mL Intravenous Q12H Madelyn Flavors A, MD   3 mL at 10/19/22 9604     Discharge Medications: Please see discharge summary for a list of discharge medications.  Relevant Imaging Results:  Relevant Lab Results:   Additional Information SSN: [id number]. Hospice to follow at [facility], LCSW

## 2022-10-19 NOTE — Progress Notes (Signed)
[facility] Liaison Note  Referral received for patient/family interest in hospice at [facility]. Interest confirmed with patient's daughter [name].   Plan is to discharge back to [facility] when medically ready.   No DME needs at this time.   Please send comfort medications/prescriptions with patient at discharge.   Please call with any questions or concerns. Thank you  Leward Quan, [facility] hospital liaison [phone]

## 2022-10-19 NOTE — Progress Notes (Signed)
Report called to [employer] at this time.

## 2022-10-30 ENCOUNTER — Inpatient Hospital Stay (HOSPITAL_COMMUNITY)
Admission: EM | Admit: 2022-10-30 | Discharge: 2022-11-13 | DRG: 389 | Disposition: A | Discharge status: Hospice | Source: Skilled Nursing Facility | Attending: Internal Medicine | Admitting: Internal Medicine

## 2022-10-30 ENCOUNTER — Other Ambulatory Visit: Payer: Self-pay

## 2022-10-30 DIAGNOSIS — Z7951 Long term (current) use of inhaled steroids: Secondary | ICD-10-CM

## 2022-10-30 DIAGNOSIS — Z515 Encounter for palliative care: Secondary | ICD-10-CM

## 2022-10-30 DIAGNOSIS — Z87891 Personal history of nicotine dependence: Secondary | ICD-10-CM

## 2022-10-30 DIAGNOSIS — K439 Ventral hernia without obstruction or gangrene: Secondary | ICD-10-CM | POA: Diagnosis present

## 2022-10-30 DIAGNOSIS — I110 Hypertensive heart disease with heart failure: Secondary | ICD-10-CM | POA: Diagnosis present

## 2022-10-30 DIAGNOSIS — Z86711 Personal history of pulmonary embolism: Secondary | ICD-10-CM

## 2022-10-30 DIAGNOSIS — Z833 Family history of diabetes mellitus: Secondary | ICD-10-CM

## 2022-10-30 DIAGNOSIS — R1310 Dysphagia, unspecified: Secondary | ICD-10-CM | POA: Diagnosis not present

## 2022-10-30 DIAGNOSIS — D62 Acute posthemorrhagic anemia: Secondary | ICD-10-CM | POA: Diagnosis present

## 2022-10-30 DIAGNOSIS — Z8042 Family history of malignant neoplasm of prostate: Secondary | ICD-10-CM

## 2022-10-30 DIAGNOSIS — F0390 Unspecified dementia without behavioral disturbance: Secondary | ICD-10-CM | POA: Diagnosis present

## 2022-10-30 DIAGNOSIS — I5032 Chronic diastolic (congestive) heart failure: Secondary | ICD-10-CM | POA: Diagnosis present

## 2022-10-30 DIAGNOSIS — K922 Gastrointestinal hemorrhage, unspecified: Secondary | ICD-10-CM | POA: Diagnosis present

## 2022-10-30 DIAGNOSIS — Z6832 Body mass index (BMI) 32.0-32.9, adult: Secondary | ICD-10-CM

## 2022-10-30 DIAGNOSIS — K56609 Unspecified intestinal obstruction, unspecified as to partial versus complete obstruction: Secondary | ICD-10-CM | POA: Diagnosis not present

## 2022-10-30 DIAGNOSIS — E876 Hypokalemia: Secondary | ICD-10-CM | POA: Diagnosis not present

## 2022-10-30 DIAGNOSIS — L89152 Pressure ulcer of sacral region, stage 2: Secondary | ICD-10-CM | POA: Diagnosis present

## 2022-10-30 DIAGNOSIS — K56600 Partial intestinal obstruction, unspecified as to cause: Secondary | ICD-10-CM | POA: Diagnosis present

## 2022-10-30 DIAGNOSIS — K5669 Other partial intestinal obstruction: Principal | ICD-10-CM | POA: Diagnosis present

## 2022-10-30 DIAGNOSIS — K219 Gastro-esophageal reflux disease without esophagitis: Secondary | ICD-10-CM | POA: Diagnosis present

## 2022-10-30 DIAGNOSIS — Z951 Presence of aortocoronary bypass graft: Secondary | ICD-10-CM

## 2022-10-30 DIAGNOSIS — Z808 Family history of malignant neoplasm of other organs or systems: Secondary | ICD-10-CM

## 2022-10-30 DIAGNOSIS — Z79899 Other long term (current) drug therapy: Secondary | ICD-10-CM

## 2022-10-30 DIAGNOSIS — G629 Polyneuropathy, unspecified: Secondary | ICD-10-CM | POA: Diagnosis present

## 2022-10-30 DIAGNOSIS — Z8709 Personal history of other diseases of the respiratory system: Secondary | ICD-10-CM

## 2022-10-30 DIAGNOSIS — G4700 Insomnia, unspecified: Secondary | ICD-10-CM | POA: Diagnosis present

## 2022-10-30 DIAGNOSIS — Z91018 Allergy to other foods: Secondary | ICD-10-CM

## 2022-10-30 DIAGNOSIS — Z66 Do not resuscitate: Secondary | ICD-10-CM | POA: Diagnosis present

## 2022-10-30 DIAGNOSIS — Z87440 Personal history of urinary (tract) infections: Secondary | ICD-10-CM

## 2022-10-30 DIAGNOSIS — J4489 Other specified chronic obstructive pulmonary disease: Secondary | ICD-10-CM | POA: Diagnosis present

## 2022-10-30 DIAGNOSIS — Z87448 Personal history of other diseases of urinary system: Secondary | ICD-10-CM

## 2022-10-30 DIAGNOSIS — Z9049 Acquired absence of other specified parts of digestive tract: Secondary | ICD-10-CM

## 2022-10-30 DIAGNOSIS — E669 Obesity, unspecified: Secondary | ICD-10-CM | POA: Diagnosis present

## 2022-10-30 DIAGNOSIS — M1990 Unspecified osteoarthritis, unspecified site: Secondary | ICD-10-CM | POA: Diagnosis present

## 2022-10-30 DIAGNOSIS — I160 Hypertensive urgency: Secondary | ICD-10-CM

## 2022-10-30 DIAGNOSIS — Z8719 Personal history of other diseases of the digestive system: Secondary | ICD-10-CM

## 2022-10-30 DIAGNOSIS — R627 Adult failure to thrive: Secondary | ICD-10-CM | POA: Diagnosis present

## 2022-10-30 DIAGNOSIS — Z7401 Bed confinement status: Secondary | ICD-10-CM

## 2022-10-30 DIAGNOSIS — Z8679 Personal history of other diseases of the circulatory system: Secondary | ICD-10-CM

## 2022-10-30 LAB — POC OCCULT BLOOD, ED: Fecal Occult Bld: POSITIVE — AB

## 2022-10-30 NOTE — ED Triage Notes (Addendum)
[facility] on [address] Coffee ground emesis per facility, 1 episode. Abdominal distended. Hx of dementia. 170/90 84 20 100 on 3l 185 cbg 96.4 F

## 2022-10-31 ENCOUNTER — Emergency Department (HOSPITAL_COMMUNITY)

## 2022-10-31 ENCOUNTER — Inpatient Hospital Stay (HOSPITAL_COMMUNITY)

## 2022-10-31 DIAGNOSIS — G4700 Insomnia, unspecified: Secondary | ICD-10-CM | POA: Diagnosis present

## 2022-10-31 DIAGNOSIS — E669 Obesity, unspecified: Secondary | ICD-10-CM | POA: Diagnosis present

## 2022-10-31 DIAGNOSIS — K439 Ventral hernia without obstruction or gangrene: Secondary | ICD-10-CM | POA: Diagnosis present

## 2022-10-31 DIAGNOSIS — I5032 Chronic diastolic (congestive) heart failure: Secondary | ICD-10-CM | POA: Diagnosis present

## 2022-10-31 DIAGNOSIS — Z7401 Bed confinement status: Secondary | ICD-10-CM | POA: Diagnosis not present

## 2022-10-31 DIAGNOSIS — J4489 Other specified chronic obstructive pulmonary disease: Secondary | ICD-10-CM | POA: Diagnosis present

## 2022-10-31 DIAGNOSIS — K922 Gastrointestinal hemorrhage, unspecified: Secondary | ICD-10-CM | POA: Diagnosis present

## 2022-10-31 DIAGNOSIS — F0390 Unspecified dementia without behavioral disturbance: Secondary | ICD-10-CM | POA: Diagnosis present

## 2022-10-31 DIAGNOSIS — R1310 Dysphagia, unspecified: Secondary | ICD-10-CM | POA: Diagnosis not present

## 2022-10-31 DIAGNOSIS — Z515 Encounter for palliative care: Secondary | ICD-10-CM | POA: Diagnosis not present

## 2022-10-31 DIAGNOSIS — R627 Adult failure to thrive: Secondary | ICD-10-CM | POA: Diagnosis present

## 2022-10-31 DIAGNOSIS — D62 Acute posthemorrhagic anemia: Secondary | ICD-10-CM | POA: Diagnosis present

## 2022-10-31 DIAGNOSIS — M1990 Unspecified osteoarthritis, unspecified site: Secondary | ICD-10-CM | POA: Diagnosis present

## 2022-10-31 DIAGNOSIS — G629 Polyneuropathy, unspecified: Secondary | ICD-10-CM | POA: Diagnosis present

## 2022-10-31 DIAGNOSIS — K56600 Partial intestinal obstruction, unspecified as to cause: Secondary | ICD-10-CM | POA: Diagnosis present

## 2022-10-31 DIAGNOSIS — K56609 Unspecified intestinal obstruction, unspecified as to partial versus complete obstruction: Secondary | ICD-10-CM | POA: Diagnosis present

## 2022-10-31 DIAGNOSIS — K219 Gastro-esophageal reflux disease without esophagitis: Secondary | ICD-10-CM | POA: Diagnosis present

## 2022-10-31 DIAGNOSIS — K5669 Other partial intestinal obstruction: Secondary | ICD-10-CM | POA: Diagnosis present

## 2022-10-31 DIAGNOSIS — Z66 Do not resuscitate: Secondary | ICD-10-CM | POA: Diagnosis present

## 2022-10-31 DIAGNOSIS — I110 Hypertensive heart disease with heart failure: Secondary | ICD-10-CM | POA: Diagnosis present

## 2022-10-31 DIAGNOSIS — Z6832 Body mass index (BMI) 32.0-32.9, adult: Secondary | ICD-10-CM | POA: Diagnosis not present

## 2022-10-31 DIAGNOSIS — Z7951 Long term (current) use of inhaled steroids: Secondary | ICD-10-CM | POA: Diagnosis not present

## 2022-10-31 DIAGNOSIS — E876 Hypokalemia: Secondary | ICD-10-CM | POA: Diagnosis not present

## 2022-10-31 DIAGNOSIS — L89152 Pressure ulcer of sacral region, stage 2: Secondary | ICD-10-CM | POA: Diagnosis present

## 2022-10-31 DIAGNOSIS — Z87891 Personal history of nicotine dependence: Secondary | ICD-10-CM | POA: Diagnosis not present

## 2022-10-31 LAB — CBC WITH DIFFERENTIAL/PLATELET
Abs Immature Granulocytes: 0.02 10*3/uL (ref 0.00–0.07)
Basophils Absolute: 0 10*3/uL (ref 0.0–0.1)
Basophils Relative: 0 %
Eosinophils Absolute: 0 10*3/uL (ref 0.0–0.5)
Eosinophils Relative: 0 %
HCT: 35.1 % — ABNORMAL LOW (ref 36.0–46.0)
Hemoglobin: 11.1 g/dL — ABNORMAL LOW (ref 12.0–15.0)
Immature Granulocytes: 0 %
Lymphocytes Relative: 17 %
Lymphs Abs: 1.3 10*3/uL (ref 0.7–4.0)
MCH: 30.8 pg (ref 26.0–34.0)
MCHC: 31.6 g/dL (ref 30.0–36.0)
MCV: 97.5 fL (ref 80.0–100.0)
Monocytes Absolute: 0.5 10*3/uL (ref 0.1–1.0)
Monocytes Relative: 6 %
Neutro Abs: 5.8 10*3/uL (ref 1.7–7.7)
Neutrophils Relative %: 77 %
Platelets: 301 10*3/uL (ref 150–400)
RBC: 3.6 MIL/uL — ABNORMAL LOW (ref 3.87–5.11)
RDW: 14.2 % (ref 11.5–15.5)
WBC: 7.6 10*3/uL (ref 4.0–10.5)
nRBC: 0 % (ref 0.0–0.2)

## 2022-10-31 LAB — URINALYSIS, ROUTINE W REFLEX MICROSCOPIC
Bacteria, UA: NONE SEEN
Bilirubin Urine: NEGATIVE
Glucose, UA: NEGATIVE mg/dL
Hgb urine dipstick: NEGATIVE
Ketones, ur: NEGATIVE mg/dL
Leukocytes,Ua: NEGATIVE
Nitrite: NEGATIVE
Protein, ur: 30 mg/dL — AB
Specific Gravity, Urine: 1.027 (ref 1.005–1.030)
pH: 5 (ref 5.0–8.0)

## 2022-10-31 LAB — HEMOGLOBIN AND HEMATOCRIT, BLOOD
HCT: 32.2 % — ABNORMAL LOW (ref 36.0–46.0)
HCT: 29.2 % — ABNORMAL LOW (ref 36.0–46.0)
HCT: 29.6 % — ABNORMAL LOW (ref 36.0–46.0)
Hemoglobin: 10.1 g/dL — ABNORMAL LOW (ref 12.0–15.0)
Hemoglobin: 9.3 g/dL — ABNORMAL LOW (ref 12.0–15.0)
Hemoglobin: 9.3 g/dL — ABNORMAL LOW (ref 12.0–15.0)

## 2022-10-31 LAB — TYPE AND SCREEN
ABO/RH(D): B POS
Antibody Screen: NEGATIVE

## 2022-10-31 LAB — BASIC METABOLIC PANEL
Anion gap: 8 (ref 5–15)
BUN: 19 mg/dL (ref 8–23)
CO2: 33 mmol/L — ABNORMAL HIGH (ref 22–32)
Calcium: 9.2 mg/dL (ref 8.9–10.3)
Chloride: 97 mmol/L — ABNORMAL LOW (ref 98–111)
Creatinine, Ser: 0.71 mg/dL (ref 0.44–1.00)
GFR, Estimated: >60 mL/min (ref >60)
Glucose, Bld: 125 mg/dL — ABNORMAL HIGH (ref 70–99)
Potassium: 3.7 mmol/L (ref 3.5–5.1)
Sodium: 138 mmol/L (ref 135–145)

## 2022-10-31 LAB — ABO/RH: ABO/RH(D): B POS

## 2022-10-31 MED ORDER — PANTOPRAZOLE SODIUM 40 MG IV SOLR
40.0000 mg | Freq: Two times a day (BID) | INTRAVENOUS | Status: DC
  Administered 2022-10-31 – 2022-11-13 (×25): 40 mg via INTRAVENOUS
  Filled 2022-10-31 (×27): qty 10

## 2022-10-31 MED ORDER — IOHEXOL 300 MG/ML  SOLN
100.0000 mL | Freq: Once | INTRAMUSCULAR | Status: AC | PRN
  Administered 2022-10-31: 100 mL via INTRAVENOUS

## 2022-10-31 MED ORDER — IPRATROPIUM-ALBUTEROL 0.5-2.5 (3) MG/3ML IN SOLN: 3.0000 mL | Freq: Four times a day (QID) | RESPIRATORY_TRACT | Status: DC | PRN

## 2022-10-31 MED ORDER — ALBUTEROL SULFATE HFA 108 (90 BASE) MCG/ACT IN AERS: 2.0000 | INHALATION_SPRAY | Freq: Four times a day (QID) | RESPIRATORY_TRACT | Status: DC | PRN

## 2022-10-31 MED ORDER — IPRATROPIUM-ALBUTEROL 0.5-2.5 (3) MG/3ML IN SOLN: 3.0000 mL | RESPIRATORY_TRACT | Status: DC | PRN

## 2022-10-31 MED ORDER — POLYVINYL ALCOHOL 1.4 % OP SOLN
1.0000 [drp] | Freq: Two times a day (BID) | OPHTHALMIC | Status: DC
  Administered 2022-11-01 – 2022-11-13 (×23): 1 [drp] via OPHTHALMIC
  Filled 2022-10-31: qty 15

## 2022-10-31 MED ORDER — HYDRALAZINE HCL 20 MG/ML IJ SOLN
5.0000 mg | Freq: Once | INTRAMUSCULAR | Status: AC
  Administered 2022-10-31: 5 mg via INTRAVENOUS
  Filled 2022-10-31: qty 1

## 2022-10-31 MED ORDER — MOMETASONE FURO-FORMOTEROL FUM 100-5 MCG/ACT IN AERO
2.0000 | INHALATION_SPRAY | Freq: Two times a day (BID) | RESPIRATORY_TRACT | Status: DC
  Administered 2022-10-31 – 2022-11-13 (×14): 2 via RESPIRATORY_TRACT
  Filled 2022-10-31: qty 8.8

## 2022-10-31 MED ORDER — DIATRIZOATE MEGLUMINE & SODIUM 66-10 % PO SOLN
90.0000 mL | Freq: Once | ORAL | Status: AC
  Administered 2022-10-31: 90 mL via NASOGASTRIC
  Filled 2022-10-31: qty 90

## 2022-10-31 MED ORDER — LACTATED RINGERS IV SOLN: INTRAVENOUS | Status: AC

## 2022-10-31 MED ORDER — HYDRALAZINE HCL 20 MG/ML IJ SOLN
5.0000 mg | Freq: Four times a day (QID) | INTRAMUSCULAR | Status: DC | PRN
  Administered 2022-10-31 – 2022-11-01 (×2): 5 mg via INTRAVENOUS
  Filled 2022-10-31: qty 1

## 2022-10-31 MED ORDER — NITROGLYCERIN 0.4 MG SL SUBL: 0.4000 mg | SUBLINGUAL_TABLET | SUBLINGUAL | Status: DC | PRN

## 2022-10-31 MED ORDER — CYCLOSPORINE 0.05 % OP EMUL
1.0000 [drp] | Freq: Two times a day (BID) | OPHTHALMIC | Status: DC
  Administered 2022-11-01 – 2022-11-13 (×24): 1 [drp] via OPHTHALMIC
  Filled 2022-10-31 (×27): qty 30

## 2022-10-31 MED ORDER — IPRATROPIUM-ALBUTEROL 0.5-2.5 (3) MG/3ML IN SOLN
3.0000 mL | Freq: Two times a day (BID) | RESPIRATORY_TRACT | Status: DC
  Administered 2022-10-31: 3 mL via RESPIRATORY_TRACT
  Filled 2022-10-31 (×2): qty 3

## 2022-10-31 NOTE — Progress Notes (Signed)
Patient seen and examined personally, I reviewed the chart, history and physical and admission note, done by admitting physician this morning and agree with the same with following addendum.  Please refer to the morning admission note for more detailed plan of care.  Briefly,  89 y.o. female with medical history significant for chronic diastolic CHF, hypertension, advanced dementia recently enrolled in hospice care who presents from SNF due to reportedly vomiting coffee-ground emesis and severely distended abdomen. In the ED: Afebrile vitals stable not hypoxic, labs stable renal function mild anemia CT abdomen and pelvis with contrast revealed findings suggestive of partial small bowel obstruction with a transition zone at the level of the terminal ileum, small right pleural effusion.  The patient's daughter at bedside was also requesting NG tube placement, NGT was placed following which large amount of fluid removed, and subsequently admitted for further management.     Seen and examined this morning Patient feels overall better abdomen is still distended She is alert awake denies abdominal pain nausea vomiting NG tube in place-had 800 cc output  A/P Partial small bowel obstruction Significant abdominal distention: Continue NG tube decompression, IV fluids supportive care.  Patient is nonambulatory and currently in hospice. General surgery consulted and informed.   Reported coffee-ground emesis FOBT positive stool: Continue supportive care with PPI, hemoglobin stable  Dementia Enrolled in hospice: Palliative care consulted for symptom management continue DNR/DNI comfort prognosis not bright Continue plan of care per palliative care  Chronic diastolic CHF: Monitor volume status chest x-ray clear and not hypoxic on admission  Ambulatory dysfunction: Currently bedbound  Hypertension: BP poorly controlled overnight currently stable.  Monitor  cont PRN iv

## 2022-10-31 NOTE — Progress Notes (Signed)
Civil engineer, contracting Hospitalized Hospice Patient  [name] is a current hospice followed at [facility] who was admitted to [facility] on 9.7.24 with primary diagnosis of small bowel obstruction.  Per Dr. Gordy Savers, hospice physician, this is a related hospital admission. Patient resting quietly in bed with eyes closed.  Spoke with floor nurse regarding patient's current condition.  Patient's daughter has been at the bedside with her mom, but is not currently present.  Patient was brought to the ED via EMS after reports of patient vomiting dark brown/blackish liquid substance on 9.6.24.  After evaluation in the ED, patient was admitted.  Patient experienced hypertension in the ED, but they have come down after treatment.  Patient had 800+ml output via NGT.  Surgical consult pending.  Vital Signs:  T 98.4 oral, BP 194/92 (198/102 @ 0145), P 79, R 20, SP02 97% on 3L oxygen via [place]  Abnormal Labs:  chloride 97, C02 33, glucose 125,  HGB 11.2, HCT 35.1, RBC 3.6, Fecal Occult Blood- Positive  Diagnostics: CT ABDOMEN PELVIS W CONTRAST   Result Date: 10/31/2022 CLINICAL DATA:  Cough for ground emesis and abdominal distension. EXAM: CT ABDOMEN AND PELVIS WITH CONTRAST TECHNIQUE: Multidetector CT imaging of the abdomen and pelvis was performed using the standard protocol following bolus administration of intravenous contrast. RADIATION DOSE REDUCTION: This exam was performed according to the departmental dose-optimization program which includes automated exposure control, adjustment of the mA and/or kV according to patient size and/or use of iterative reconstruction technique. CONTRAST:  OMNIPAQUE IOHEXOL 300 MG/ML  SOLN COMPARISON:  October 13, 2022 FINDINGS: Lower chest: There is a small right pleural effusion. This is mildly increased in size when compared to the prior exam. Hepatobiliary: No focal liver abnormality is seen. Status post cholecystectomy. No biliary dilatation.  Pancreas: Unremarkable. No pancreatic ductal dilatation or surrounding inflammatory changes. Spleen: Normal in size without focal abnormality. Adrenals/Urinary Tract: Adrenal glands are unremarkable. Kidneys are normal, without renal calculi, focal lesion, or hydronephrosis. The urinary bladder is poorly distended and subsequently limited in evaluation. Stomach/Bowel: Stomach is within normal limits. The appendix is normal. Numerous dilated small bowel loops are seen throughout the abdomen and pelvis (maximum small bowel diameter of approximately 4.3 cm). A transition zone is seen at the level of the terminal ileum (axial CT images 32 through 34, CT series 4). Noninflamed diverticula are seen throughout the large bowel. Persistently distended mid to distal sigmoid colon and rectum is noted. Vascular/Lymphatic: Aortic atherosclerosis. No enlarged abdominal or pelvic lymph nodes. Reproductive: Uterus and bilateral adnexa are unremarkable. Other: No abdominal wall hernia or abnormality. No abdominopelvic ascites. Musculoskeletal: Multilevel chronic and degenerative changes seen throughout the lumbar spine. IMPRESSION: 1. Findings consistent with a partial small bowel obstruction with a transition zone at the level of the terminal ileum. 2. Colonic diverticulosis. 3. Small right pleural effusion. 4. Aortic atherosclerosis. Aortic Atherosclerosis (ICD10-I70.0). Electronically Signed   By: Aram Candela M.D.   On: 10/31/2022 03:24    DG Chest 1 View   Result Date: 10/31/2022 CLINICAL DATA:  Coffee brown emesis and abdominal distension. EXAM: CHEST  1 VIEW COMPARISON:  October 13, 2022 FINDINGS: The heart size and mediastinal contours are within normal limits. There is moderate severity calcification of the aortic arch. Both lungs are clear. Degenerative changes are seen involving the right shoulder and throughout the thoracic spine. IMPRESSION: No active disease. Electronically Signed   By: Aram Candela M.D.    On: 10/31/2022 03:16  IV medications: Protonix 40mg  IV BID Hydralazine 5mg  IV q6prn- last dose today @ 0402 LR @ 148ml/hour  HOSPITAL PROBLEM Partial small bowel obstruction status post NG tube placement to suction CT abdomen and pelvis with contrast revealed findings suggestive of partial small bowel obstruction with a transition zone at the level of the terminal ileum, small right pleural effusion.  The patient's daughter at bedside wants NG tube placed.  NG tube was placed in the ED to suction.  Large amount of fluid removed from severely distended abdomen. IV fluid hydration Replete electrolytes as indicated, optimize magnesium and potassium levels Per the patient's daughter at bedside the patient is nonambulatory.   Reported ground coffee emesis with positive FOBT Add IV Protonix 40 mg twice daily Serial H&H every 6 hours Transfuse hemoglobin less than 8.0 Maintain MAP greater than 65   Dementia, recently enrolled in hospice care Palliative care medicine consulted. DNR/DNI   Chronic diastolic CHF Closely monitor volume status while on IV fluid No evidence of pulmonary edema on chest x-ray, O2 saturation 97% on room air Start strict I's and O's and daily weight   Hypertension BP is not at goal, elevated IV hydralazine as needed with parameters Closely monitor vital signs   Ambulatory dysfunction Per her daughter at bedside she is nonambulatory Fall precautions  Discharge Planning:  Ongoing-  Family contact:  Called and spoke with [name], one of patient's daughters.  She left hospital around 7am because she had been with her mom all night in the ED.  She confirms patient is DNR- most form completed.  They want to treat the treatable and goals are still focused on comfort. IDT:  Updated   Goals of Care- treat the treatable and keep Leslie Farley comfortable. Day 1 only:  Medication list given to floor secretary Should patient need ambulance transfer at discharge-  please use [facility]) as they contract this service for our active hospice patients.  Norris Cross, RN Nurse Liaison [phone]

## 2022-10-31 NOTE — ED Notes (Signed)
ED TO INPATIENT HANDOFF REPORT  ED Nurse Name and Phone #: Ardyth Gal [phone]    S Name/Age/Gender Leslie Farley 89 y.o. female Room/Bed: WA09/WA09  Code Status   Code Status: Limited: Do not attempt resuscitation (DNR) -DNR-LIMITED -Do Not Intubate/DNI   Home/SNF/Other Skilled nursing facility Patient oriented to: self and time Is this baseline? Yes   Triage Complete: Triage complete  Chief Complaint Partial small bowel obstruction (HCC) [K56.600]  Triage Note [facility] on [address] Coffee ground emesis per facility, 1 episode. Abdominal distended. Hx of dementia. 170/90 84 20 100 on 3l 185 cbg 96.4 F     Allergies Allergies  Allergen Reactions   Cucumber Extract     Level of Care/Admitting Diagnosis ED Disposition     ED Disposition  Admit   Condition  --   Comment  Hospital Area: [facility] [[id number]]  Level of Care: Telemetry [5]  Admit to tele based on following criteria: Monitor for Ischemic changes  May admit patient to [facility] or [facility] if equivalent level of care is available:: Yes  Covid Evaluation: Asymptomatic - no recent exposure (last 10 days) testing not required  Diagnosis: Partial small bowel obstruction [facility]) [[id number]]  Admitting Physician: Darlin Drop [[id number]]  Attending Physician: Darlin Drop [[id number]]  Certification:: I certify this patient will need inpatient services for at least 2 midnights  Expected Medical Readiness: 11/02/2022          B Medical/Surgery History Past Medical History:  Diagnosis Date   Arthritis    Asthma    COPD (chronic obstructive pulmonary disease) (HCC)    GERD (gastroesophageal reflux disease)    Hypertension    Past Surgical History:  Procedure Laterality Date   CHOLECYSTECTOMY       A IV Location/Drains/Wounds Patient Lines/Drains/Airways Status     Active Line/Drains/Airways     Name Placement date Placement time Site Days   Peripheral IV  10/31/22 20 G Anterior;Left Forearm 10/31/22  0242  Forearm  less than 1   NG/OG Vented/Dual Lumen 16 Fr. Right nare Marking at nare/corner of mouth 55 cm 10/31/22  0347  Right nare  less than 1   Pressure Injury 09/16/22 Coccyx Posterior;Mid Stage 1 -  Intact skin with non-blanchable redness of a localized area usually over a bony prominence. Ol wound healed, still not blachable 09/16/22  1240  -- 45            Intake/Output Last 24 hours No intake or output data in the 24 hours ending 10/31/22 0443  Labs/Imaging Results for orders placed or performed during the hospital encounter of 10/30/22 (from the past 48 hour(s))  POC occult blood, ED Provider will collect     Status: Abnormal   Collection Time: 10/30/22 11:19 PM  Result Value Ref Range   Fecal Occult Bld POSITIVE (A) NEGATIVE  CBC with Differential     Status: Abnormal   Collection Time: 10/31/22 12:55 AM  Result Value Ref Range   WBC 7.6 4.0 - 10.5 K/uL   RBC 3.60 (L) 3.87 - 5.11 MIL/uL   Hemoglobin 11.1 (L) 12.0 - 15.0 g/dL   HCT 57.8 (L) 46.9 - 62.9 %   MCV 97.5 80.0 - 100.0 fL   MCH 30.8 26.0 - 34.0 pg   MCHC 31.6 30.0 - 36.0 g/dL   RDW 52.8 41.3 - 24.4 %   Platelets 301 150 - 400 K/uL   nRBC 0.0 0.0 - 0.2 %  Neutrophils Relative % 77 %   Neutro Abs 5.8 1.7 - 7.7 K/uL   Lymphocytes Relative 17 %   Lymphs Abs 1.3 0.7 - 4.0 K/uL   Monocytes Relative 6 %   Monocytes Absolute 0.5 0.1 - 1.0 K/uL   Eosinophils Relative 0 %   Eosinophils Absolute 0.0 0.0 - 0.5 K/uL   Basophils Relative 0 %   Basophils Absolute 0.0 0.0 - 0.1 K/uL   Immature Granulocytes 0 %   Abs Immature Granulocytes 0.02 0.00 - 0.07 K/uL    Comment: Performed at [facility], [address] [place], [place] [address]  Basic metabolic panel     Status: Abnormal   Collection Time: 10/31/22 12:55 AM  Result Value Ref Range   Sodium 138 135 - 145 mmol/L   Potassium 3.7 3.5 - 5.1 mmol/L   Chloride 97 (L) 98 - 111 mmol/L   CO2  33 (H) 22 - 32 mmol/L   Glucose, Bld 125 (H) 70 - 99 mg/dL    Comment: Glucose reference range applies only to samples taken after fasting for at least 8 hours.   BUN 19 8 - 23 mg/dL   Creatinine, Ser 6.04 0.44 - 1.00 mg/dL   Calcium 9.2 8.9 - 54.0 mg/dL   GFR, Estimated >98 >11 mL/min    Comment: (NOTE) Calculated using the CKD-EPI Creatinine Equation (2021)    Anion gap 8 5 - 15    Comment: Performed at [facility], [address] [place], [place] [address]  Type and screen [facility]     Status: None   Collection Time: 10/31/22 12:55 AM  Result Value Ref Range   ABO/RH(D) B POS    Antibody Screen NEG    Sample Expiration      11/03/2022,2359 Performed at [facility], [address] [place], [place] [address]   ABO/Rh     Status: None   Collection Time: 10/31/22 12:57 AM  Result Value Ref Range   ABO/RH(D)      B POS Performed at [facility], [address] [place], [place] [address]   Urinalysis, Routine w reflex microscopic -Urine, Clean Catch     Status: Abnormal   Collection Time: 10/31/22  2:35 AM  Result Value Ref Range   Color, Urine YELLOW YELLOW   APPearance CLEAR CLEAR   Specific Gravity, Urine 1.027 1.005 - 1.030   pH 5.0 5.0 - 8.0   Glucose, UA NEGATIVE NEGATIVE mg/dL   Hgb urine dipstick NEGATIVE NEGATIVE   Bilirubin Urine NEGATIVE NEGATIVE   Ketones, ur NEGATIVE NEGATIVE mg/dL   Protein, ur 30 (A) NEGATIVE mg/dL   Nitrite NEGATIVE NEGATIVE   Leukocytes,Ua NEGATIVE NEGATIVE   RBC / HPF 0-5 0 - 5 RBC/hpf   WBC, UA 0-5 0 - 5 WBC/hpf   Bacteria, UA NONE SEEN NONE SEEN   Squamous Epithelial / HPF 0-5 0 - 5 /HPF   Mucus PRESENT     Comment: Performed at [facility], [address], [place], [place] [address]   CT ABDOMEN PELVIS W CONTRAST  Result Date: 10/31/2022 CLINICAL DATA:  Cough for ground emesis and abdominal distension. EXAM: CT ABDOMEN AND PELVIS  WITH CONTRAST TECHNIQUE: Multidetector CT imaging of the abdomen and pelvis was performed using the standard protocol following bolus administration of intravenous contrast. RADIATION DOSE REDUCTION: This exam was performed according to the departmental dose-optimization program which includes automated exposure control, adjustment of the  mA and/or kV according to patient size and/or use of iterative reconstruction technique. CONTRAST:  OMNIPAQUE IOHEXOL 300 MG/ML  SOLN COMPARISON:  October 13, 2022 FINDINGS: Lower chest: There is a small right pleural effusion. This is mildly increased in size when compared to the prior exam. Hepatobiliary: No focal liver abnormality is seen. Status post cholecystectomy. No biliary dilatation. Pancreas: Unremarkable. No pancreatic ductal dilatation or surrounding inflammatory changes. Spleen: Normal in size without focal abnormality. Adrenals/Urinary Tract: Adrenal glands are unremarkable. Kidneys are normal, without renal calculi, focal lesion, or hydronephrosis. The urinary bladder is poorly distended and subsequently limited in evaluation. Stomach/Bowel: Stomach is within normal limits. The appendix is normal. Numerous dilated small bowel loops are seen throughout the abdomen and pelvis (maximum small bowel diameter of approximately 4.3 cm). A transition zone is seen at the level of the terminal ileum (axial CT images 32 through 34, CT series 4). Noninflamed diverticula are seen throughout the large bowel. Persistently distended mid to distal sigmoid colon and rectum is noted. Vascular/Lymphatic: Aortic atherosclerosis. No enlarged abdominal or pelvic lymph nodes. Reproductive: Uterus and bilateral adnexa are unremarkable. Other: No abdominal wall hernia or abnormality. No abdominopelvic ascites. Musculoskeletal: Multilevel chronic and degenerative changes seen throughout the lumbar spine. IMPRESSION: 1. Findings consistent with a partial small bowel obstruction with a  transition zone at the level of the terminal ileum. 2. Colonic diverticulosis. 3. Small right pleural effusion. 4. Aortic atherosclerosis. Aortic Atherosclerosis (ICD10-I70.0). Electronically Signed   By: Aram Candela M.D.   On: 10/31/2022 03:24   DG Chest 1 View  Result Date: 10/31/2022 CLINICAL DATA:  Coffee brown emesis and abdominal distension. EXAM: CHEST  1 VIEW COMPARISON:  October 13, 2022 FINDINGS: The heart size and mediastinal contours are within normal limits. There is moderate severity calcification of the aortic arch. Both lungs are clear. Degenerative changes are seen involving the right shoulder and throughout the thoracic spine. IMPRESSION: No active disease. Electronically Signed   By: Aram Candela M.D.   On: 10/31/2022 03:16    Pending Labs Unresulted Labs (From admission, onward)    None       Vitals/Pain Today's Vitals   10/31/22 0230 10/31/22 0402 10/31/22 0415 10/31/22 0430  BP: (!) 208/93 (!) 199/94 (!) 162/71 (!) 155/71  Pulse: 79  77 80  Resp:      Temp:      TempSrc:      SpO2: 100%  100% 100%    Isolation Precautions No active isolations  Medications Medications  hydrALAZINE (APRESOLINE) injection 5 mg (5 mg Intravenous Given 10/31/22 0402)  lactated ringers infusion ( Intravenous New Bag/Given 10/31/22 0401)  iohexol (OMNIPAQUE) 300 MG/ML solution 100 mL (100 mLs Intravenous Contrast Given 10/31/22 0244)  hydrALAZINE (APRESOLINE) injection 5 mg (5 mg Intravenous Given 10/31/22 0358)    Mobility non-ambulatory     Focused Assessments Patient came in from SNF on hospice care for coffee ground emesis. Patient since being in my care has not had any episodes of vomiting. Severely distended abdomen. Patient had CT revealed SBO and placed NG tube. Was confirmed with Xray and on intermittent suction.   R Recommendations: See Admitting Provider Note  Report given to:   Additional Notes: Patient speaks [place] or [place]. Patients daughter has been at  bedside helping assist when talking with patient. Patient does have baseline dementia and hard to get to do basic commands. Patient does have the soft mittens on at this time so she doesn't pull out NG  tube. Daughter states when she is anxious she drawers upwards and was trying to pull it out after securing it.

## 2022-10-31 NOTE — Hospital Course (Addendum)
Leslie Farley is a 89 y.o. female with a history of diastolic heart failure, hypertension, dementia currently on hospice.  Patient presented secondary to vomiting with coffee-ground emesis in addition to a severely distended abdomen.  On admission, patient was found to have evidence of a partial small obstruction with transition point in the terminal ileum.  Gastroenterology was consulted for coffee-ground emesis with decision made for conservative management secondary to goals of care.  General surgery was consulted for obstruction within assessment more consistent with likely pseudoobstruction/ileus.  Patient managed with suppositories with development of bowel movements, abdomen remains distended although having bowel movement.  Her abdomen appears softer and less distended 9/20 had a bowel movement, added lites electively stable. I called and daughter and udpated and she is agreeable for discharge.

## 2022-10-31 NOTE — ED Provider Notes (Signed)
[place] EMERGENCY DEPARTMENT AT [facility] Provider Note   CSN: [id number] Arrival date & time: 10/30/22  2139     History  Chief Complaint  Patient presents with   Emesis    Leslie Farley is a 89 y.o. female.  The history is provided by a relative and medical records. The history is limited by the condition of the patient.  Emesis Severity:  Moderate Timing:  Intermittent Quality:  Stomach contents Progression:  Unchanged Chronicity:  New Recent urination:  Normal Relieved by:  Nothing Worsened by:  Nothing Associated symptoms: no diarrhea   Risk factors: no alcohol use   Patient with GERD and HTN with vomiting and abdominal distention.      Past Medical History:  Diagnosis Date   Arthritis    Asthma    COPD (chronic obstructive pulmonary disease) (HCC)    GERD (gastroesophageal reflux disease)    Hypertension      Home Medications Prior to Admission medications   Medication Sig Start Date End Date Taking? Authorizing Provider  acetaminophen (TYLENOL) 500 MG tablet Take 2 tablets (1,000 mg total) by mouth every 6 (six) hours as needed for mild pain or moderate pain. 11/20/16   Elson Areas, PA-C  ADVAIR DISKUS 100-50 MCG/ACT AEPB Inhale 1 puff into the lungs every 12 (twelve) hours. 03/26/22   [provider]  albuterol (VENTOLIN HFA) 108 (90 Base) MCG/ACT inhaler Inhale 2 puffs into the lungs every 6 (six) hours as needed for wheezing or shortness of breath. 04/01/22   Pokhrel, Rebekah Chesterfield, MD  Amino Acids-Protein Hydrolys (FEEDING SUPPLEMENT, PRO-STAT SUGAR FREE 64,) LIQD Take 30 mLs by mouth in the morning and at bedtime.    [provider]  amLODipine (NORVASC) 5 MG tablet Take 1 tablet (5 mg total) by mouth daily. 09/23/22   Regalado, Belkys A, MD  bisacodyl (DULCOLAX) 10 MG suppository Place 1 suppository (10 mg total) rectally daily as needed for moderate constipation. 09/22/22   Regalado, Belkys A, MD  Cholecalciferol (VITAMIN D)  50 MCG (2000 UT) tablet Take 2,000 Units by mouth daily.    [provider]  cycloSPORINE (RESTASIS) 0.05 % ophthalmic emulsion Place 1 drop into both eyes 2 (two) times daily.    [provider]  famotidine (PEPCID) 20 MG tablet Take 1 tablet (20 mg total) by mouth daily. 04/02/22   Pokhrel, Rebekah Chesterfield, MD  ipratropium-albuterol (DUONEB) 0.5-2.5 (3) MG/3ML SOLN Take 3 mLs by nebulization every 6 (six) hours as needed. 09/22/22   Regalado, Belkys A, MD  ipratropium-albuterol (DUONEB) 0.5-2.5 (3) MG/3ML SOLN Take 3 mLs by nebulization 2 (two) times daily. 09/22/22   Regalado, Belkys A, MD  melatonin 3 MG TABS tablet Take 6 mg by mouth at bedtime.    [provider]  Multiple Vitamins-Minerals (CERTAVITE/ANTIOXIDANTS PO) Take 1 tablet by mouth daily.    [provider]  nitroGLYCERIN (NITROSTAT) 0.4 MG SL tablet Place 0.4 mg under the tongue every 5 (five) minutes as needed for chest pain (Do not exceed 3 doses per episode.).    [provider]  polyethylene glycol (MIRALAX / GLYCOLAX) packet Take 17 g by mouth daily.    [provider]  Propylene Glycol (SYSTANE BALANCE) 0.6 % SOLN Place 1 drop into both eyes in the morning and at bedtime.    [provider]  Psyllium (FP FIBER LAXATIVE PO) Take 500 mg by mouth 2 (two) times daily.    [provider]  QUEtiapine (SEROQUEL) 25 MG  tablet Take 1 tablet (25 mg total) by mouth at bedtime. 09/22/22   Regalado, Belkys A, MD  senna-docusate (SENOKOT-S) 8.6-50 MG tablet Take 2 tablets by mouth at bedtime.    [provider]  tiZANidine (ZANAFLEX) 2 MG tablet Take 1 tablet (2 mg total) by mouth at bedtime. After completing cipro 04/06/22   Pokhrel, Rebekah Chesterfield, MD  Zinc Oxide (Z-BUM) 22 % CREA Apply 1 Application topically in the morning and at bedtime. Apply to inner thighs and perineal area twice a day.    [provider]      Allergies    Cucumber extract    Review of Systems    Review of Systems  Unable to perform ROS: Acuity of condition  Gastrointestinal:  Positive for vomiting. Negative for diarrhea.    Physical Exam Updated Vital Signs BP (!) 208/93   Pulse 79   Temp 98.2 F (36.8 C) (Oral)   Resp 16   SpO2 100%  Physical Exam Vitals and nursing note reviewed.  Constitutional:      General: She is not in acute distress.    Appearance: She is well-developed.  HENT:     Head: Normocephalic and atraumatic.     Nose: Nose normal.  Eyes:     Pupils: Pupils are equal, round, and reactive to light.  Cardiovascular:     Rate and Rhythm: Normal rate and regular rhythm.     Pulses: Normal pulses.     Heart sounds: Normal heart sounds.  Pulmonary:     Effort: Pulmonary effort is normal. No respiratory distress.     Breath sounds: Normal breath sounds.  Abdominal:     General: There is distension.     Palpations: Abdomen is soft.     Tenderness: There is no abdominal tenderness. There is no guarding or rebound.     Comments: Tinkling bowel sounds   Genitourinary:    Rectum: Guaiac result positive.  Musculoskeletal:        General: Normal range of motion.     Cervical back: Neck supple.  Skin:    General: Skin is dry.     Capillary Refill: Capillary refill takes less than 2 seconds.     Findings: No erythema or rash.  Neurological:     General: No focal deficit present.     Mental Status: She is alert.     Deep Tendon Reflexes: Reflexes normal.  Psychiatric:        Mood and Affect: Mood normal.     ED Results / Procedures / Treatments   Labs (all labs ordered are listed, but only abnormal results are displayed) Results for orders placed or performed during the hospital encounter of 10/30/22  CBC with Differential  Result Value Ref Range   WBC 7.6 4.0 - 10.5 K/uL   RBC 3.60 (L) 3.87 - 5.11 MIL/uL   Hemoglobin 11.1 (L) 12.0 - 15.0 g/dL   HCT 96.0 (L) 45.4 - 09.8 %   MCV 97.5 80.0 - 100.0 fL   MCH 30.8 26.0 - 34.0 pg   MCHC 31.6 30.0 -  36.0 g/dL   RDW 11.9 14.7 - 82.9 %   Platelets 301 150 - 400 K/uL   nRBC 0.0 0.0 - 0.2 %   Neutrophils Relative % 77 %   Neutro Abs 5.8 1.7 - 7.7 K/uL   Lymphocytes Relative 17 %   Lymphs Abs 1.3 0.7 - 4.0 K/uL   Monocytes Relative 6 %   Monocytes Absolute 0.5 0.1 -  1.0 K/uL   Eosinophils Relative 0 %   Eosinophils Absolute 0.0 0.0 - 0.5 K/uL   Basophils Relative 0 %   Basophils Absolute 0.0 0.0 - 0.1 K/uL   Immature Granulocytes 0 %   Abs Immature Granulocytes 0.02 0.00 - 0.07 K/uL  Basic metabolic panel  Result Value Ref Range   Sodium 138 135 - 145 mmol/L   Potassium 3.7 3.5 - 5.1 mmol/L   Chloride 97 (L) 98 - 111 mmol/L   CO2 33 (H) 22 - 32 mmol/L   Glucose, Bld 125 (H) 70 - 99 mg/dL   BUN 19 8 - 23 mg/dL   Creatinine, Ser 1.30 0.44 - 1.00 mg/dL   Calcium 9.2 8.9 - 86.5 mg/dL   GFR, Estimated >78 >46 mL/min   Anion gap 8 5 - 15  Urinalysis, Routine w reflex microscopic -Urine, Clean Catch  Result Value Ref Range   Color, Urine YELLOW YELLOW   APPearance CLEAR CLEAR   Specific Gravity, Urine 1.027 1.005 - 1.030   pH 5.0 5.0 - 8.0   Glucose, UA NEGATIVE NEGATIVE mg/dL   Hgb urine dipstick NEGATIVE NEGATIVE   Bilirubin Urine NEGATIVE NEGATIVE   Ketones, ur NEGATIVE NEGATIVE mg/dL   Protein, ur 30 (A) NEGATIVE mg/dL   Nitrite NEGATIVE NEGATIVE   Leukocytes,Ua NEGATIVE NEGATIVE   RBC / HPF 0-5 0 - 5 RBC/hpf   WBC, UA 0-5 0 - 5 WBC/hpf   Bacteria, UA NONE SEEN NONE SEEN   Squamous Epithelial / HPF 0-5 0 - 5 /HPF   Mucus PRESENT   POC occult blood, ED Provider will collect  Result Value Ref Range   Fecal Occult Bld POSITIVE (A) NEGATIVE  Type and screen [facility]  Result Value Ref Range   ABO/RH(D) B POS    Antibody Screen NEG    Sample Expiration      11/03/2022,2359 Performed at [facility], [address], [place], [place] [address]   ABO/Rh  Result Value Ref Range   ABO/RH(D)      B POS Performed at [facility], [address] [place], [place] [address]    CT ABDOMEN PELVIS W CONTRAST  Result Date: 10/31/2022 CLINICAL DATA:  Cough for ground emesis and abdominal distension. EXAM: CT ABDOMEN AND PELVIS WITH CONTRAST TECHNIQUE: Multidetector CT imaging of the abdomen and pelvis was performed using the standard protocol following bolus administration of intravenous contrast. RADIATION DOSE REDUCTION: This exam was performed according to the departmental dose-optimization program which includes automated exposure control, adjustment of the mA and/or kV according to patient size and/or use of iterative reconstruction technique. CONTRAST:  OMNIPAQUE IOHEXOL 300 MG/ML  SOLN COMPARISON:  October 13, 2022 FINDINGS: Lower chest: There is a small right pleural effusion. This is mildly increased in size when compared to the prior exam. Hepatobiliary: No focal liver abnormality is seen. Status post cholecystectomy. No biliary dilatation. Pancreas: Unremarkable. No pancreatic ductal dilatation or surrounding inflammatory changes. Spleen: Normal in size without focal abnormality. Adrenals/Urinary Tract: Adrenal glands are unremarkable. Kidneys are normal, without renal calculi, focal lesion, or hydronephrosis. The urinary bladder is poorly distended and subsequently limited in evaluation. Stomach/Bowel: Stomach is within normal limits. The appendix is normal. Numerous dilated small bowel loops are seen throughout the abdomen and pelvis (maximum small bowel diameter of approximately 4.3 cm). A transition zone is seen at the level of the terminal ileum (axial CT images 32 through 34, CT series 4). Noninflamed  diverticula are seen throughout the large bowel. Persistently distended mid to distal sigmoid colon and rectum is noted. Vascular/Lymphatic: Aortic atherosclerosis. No enlarged abdominal or pelvic lymph nodes. Reproductive: Uterus and bilateral adnexa are unremarkable. Other: No abdominal wall hernia or  abnormality. No abdominopelvic ascites. Musculoskeletal: Multilevel chronic and degenerative changes seen throughout the lumbar spine. IMPRESSION: 1. Findings consistent with a partial small bowel obstruction with a transition zone at the level of the terminal ileum. 2. Colonic diverticulosis. 3. Small right pleural effusion. 4. Aortic atherosclerosis. Aortic Atherosclerosis (ICD10-I70.0). Electronically Signed   By: Aram Candela M.D.   On: 10/31/2022 03:24   DG Chest 1 View  Result Date: 10/31/2022 CLINICAL DATA:  Coffee brown emesis and abdominal distension. EXAM: CHEST  1 VIEW COMPARISON:  October 13, 2022 FINDINGS: The heart size and mediastinal contours are within normal limits. There is moderate severity calcification of the aortic arch. Both lungs are clear. Degenerative changes are seen involving the right shoulder and throughout the thoracic spine. IMPRESSION: No active disease. Electronically Signed   By: Aram Candela M.D.   On: 10/31/2022 03:16   CT ABDOMEN PELVIS WO CONTRAST  Result Date: 10/13/2022 CLINICAL DATA:  Abdominal pain and swelling. Bowel obstruction suspected. EXAM: CT ABDOMEN AND PELVIS WITHOUT CONTRAST TECHNIQUE: Multidetector CT imaging of the abdomen and pelvis was performed following the standard protocol without IV contrast. RADIATION DOSE REDUCTION: This exam was performed according to the departmental dose-optimization program which includes automated exposure control, adjustment of the mA and/or kV according to patient size and/or use of iterative reconstruction technique. COMPARISON:  September 15, 2022 FINDINGS: Lower chest: Bibasilar atelectasis versus airspace consolidation. Hepatobiliary: No focal liver abnormality is seen. Status post cholecystectomy. No biliary dilatation. Pancreas: Unremarkable. No pancreatic ductal dilatation or surrounding inflammatory changes. Spleen: Normal in size without focal abnormality. Adrenals/Urinary Tract: Adrenal glands are  unremarkable. Kidneys are normal, without renal calculi, focal lesion, or hydronephrosis. Bladder is unremarkable. Stomach/Bowel: Normal appearance of the stomach and small bowel. There is gas and fluid distension of the rectum and sigmoid colon which transitions to normal caliber descending and transverse colon. Known atomic obstruction is identified. Left colonic diverticulosis. Vascular/Lymphatic: Aortic atherosclerosis. No enlarged abdominal or pelvic lymph nodes. Reproductive: Uterus and bilateral adnexa are unremarkable. Other: No abdominal wall hernia or abnormality. No abdominopelvic ascites. Musculoskeletal: Osteopenia. Spondylosis and scoliosis of the lumbosacral spine.Subcutaneous stranding and thickening of the abdominal musculature in the left central abdomen/flank, image 33/89, series 3. Small amount of subcutaneous gas is also seen in this area, image 43/89, series 3. IMPRESSION: 1. Gas and fluid distension of the rectum and sigmoid colon which transitions to normal caliber descending and transverse colon. No definite atomic obstruction is identified. 2. Left colonic diverticulosis without evidence of acute diverticulitis. 3. Subcutaneous stranding and thickening of the abdominal musculature in the left central abdomen/flank. Small amount of subcutaneous gas is also seen in this area. This may represent focal hematoma or cellulitis/myositis. 4. Bibasilar atelectasis versus airspace consolidation. 5. Aortic atherosclerosis. Aortic Atherosclerosis (ICD10-I70.0). Electronically Signed   By: Ted Mcalpine M.D.   On: 10/13/2022 11:34   DG Abd 1 View  Result Date: 10/13/2022 CLINICAL DATA:  89 year old female with ileus. EXAM: ABDOMEN - 1 VIEW COMPARISON:  Abdominal radiograph 10/11/2022. FINDINGS: Multiple prominent gas-filled loops of small bowel and colon are again noted. Gaseous distention of the sigmoid colon measuring up to 10 cm in diameter. No pneumoperitoneum. IMPRESSION: 1. Nonspecific  bowel gas pattern, which may again suggest an ileus,  very similar to the prior examination. Electronically Signed   By: Trudie Reed M.D.   On: 10/13/2022 09:00   DG Chest Port 1 View  Result Date: 10/13/2022 CLINICAL DATA:  [id number] with shortness of breath, wheezing and COPD. EXAM: PORTABLE CHEST 1 VIEW COMPARISON:  Portable chest 10/11/2022 at 5:16 p.m. FINDINGS: 6:26 a.m. The lungs are clear. No pleural effusion is seen. The cardiomediastinal silhouette and vascular pattern are normal. There is calcific plaque in the aortic arch. Advanced thoracic spondylosis and advanced right shoulder DJD. IMPRESSION: No evidence of acute chest disease.  Aortic atherosclerosis. Electronically Signed   By: Almira Bar M.D.   On: 10/13/2022 06:57   DG ABD ACUTE 2+V W 1V CHEST  Result Date: 10/11/2022 CLINICAL DATA:  Abdominal distention, nausea and vomiting. EXAM: DG ABDOMEN ACUTE WITH 1 VIEW CHEST COMPARISON:  Abdominal radiograph dated 09/18/2022 and chest radiograph dated 09/16/2022. FINDINGS: Multiple gas-filled loops of bowel are seen throughout the abdomen. At least a few loops of colon appear distended in the lower midline abdomen, similar to 09/18/2022. No air-fluid levels or free intraperitoneal air are noted. No radiopaque calculi are seen. Heart size and mediastinal contours are within normal limits. Both lungs are clear. IMPRESSION: 1. Multiple gas-filled loops of bowel throughout the abdomen with at least a few loops of colon appearing distended in the lower midline abdomen, similar to 09/18/2022. These findings likely reflect ileus. 2.  No acute pulmonary process. Electronically Signed   By: Romona Curls M.D.   On: 10/11/2022 18:19    Radiology CT ABDOMEN PELVIS W CONTRAST  Result Date: 10/31/2022 CLINICAL DATA:  Cough for ground emesis and abdominal distension. EXAM: CT ABDOMEN AND PELVIS WITH CONTRAST TECHNIQUE: Multidetector CT imaging of the abdomen and pelvis was performed using the standard  protocol following bolus administration of intravenous contrast. RADIATION DOSE REDUCTION: This exam was performed according to the departmental dose-optimization program which includes automated exposure control, adjustment of the mA and/or kV according to patient size and/or use of iterative reconstruction technique. CONTRAST:  OMNIPAQUE IOHEXOL 300 MG/ML  SOLN COMPARISON:  October 13, 2022 FINDINGS: Lower chest: There is a small right pleural effusion. This is mildly increased in size when compared to the prior exam. Hepatobiliary: No focal liver abnormality is seen. Status post cholecystectomy. No biliary dilatation. Pancreas: Unremarkable. No pancreatic ductal dilatation or surrounding inflammatory changes. Spleen: Normal in size without focal abnormality. Adrenals/Urinary Tract: Adrenal glands are unremarkable. Kidneys are normal, without renal calculi, focal lesion, or hydronephrosis. The urinary bladder is poorly distended and subsequently limited in evaluation. Stomach/Bowel: Stomach is within normal limits. The appendix is normal. Numerous dilated small bowel loops are seen throughout the abdomen and pelvis (maximum small bowel diameter of approximately 4.3 cm). A transition zone is seen at the level of the terminal ileum (axial CT images 32 through 34, CT series 4). Noninflamed diverticula are seen throughout the large bowel. Persistently distended mid to distal sigmoid colon and rectum is noted. Vascular/Lymphatic: Aortic atherosclerosis. No enlarged abdominal or pelvic lymph nodes. Reproductive: Uterus and bilateral adnexa are unremarkable. Other: No abdominal wall hernia or abnormality. No abdominopelvic ascites. Musculoskeletal: Multilevel chronic and degenerative changes seen throughout the lumbar spine. IMPRESSION: 1. Findings consistent with a partial small bowel obstruction with a transition zone at the level of the terminal ileum. 2. Colonic diverticulosis. 3. Small right pleural effusion.  4. Aortic atherosclerosis. Aortic Atherosclerosis (ICD10-I70.0). Electronically Signed   By: Aram Candela M.D.   On: 10/31/2022 03:24  DG Chest 1 View  Result Date: 10/31/2022 CLINICAL DATA:  Coffee brown emesis and abdominal distension. EXAM: CHEST  1 VIEW COMPARISON:  October 13, 2022 FINDINGS: The heart size and mediastinal contours are within normal limits. There is moderate severity calcification of the aortic arch. Both lungs are clear. Degenerative changes are seen involving the right shoulder and throughout the thoracic spine. IMPRESSION: No active disease. Electronically Signed   By: Aram Candela M.D.   On: 10/31/2022 03:16    Procedures Procedures    Medications Ordered in ED Medications  iohexol (OMNIPAQUE) 300 MG/ML solution 100 mL (100 mLs Intravenous Contrast Given 10/31/22 0244)    ED Course/ Medical Decision Making/ A&P                                 Medical Decision Making Patient with emesis who is on hospice and has a MOLST form.  Family wants everything short of cardiac intervention and intubation, no PEG tubes.   Amount and/or Complexity of Data Reviewed Independent Historian:     Details: Daughter see above  External Data Reviewed: notes.    Details: Previous notes reviewed  Labs: ordered.    Details: White count normal 7.6, low hemoglobin 11.1, normal platelets. Normal sodium 138, normal potassium 3.7. urine without UTI.  Occult blood positive  Radiology: ordered and independent interpretation performed.    Details: SBO by me  Discussion of management or test interpretation with external provider(s): Dr. Margo Aye who will admit  [facility] surgery group chat for consult   Risk Prescription drug management. Decision regarding hospitalization.    Final Clinical Impression(s) / ED Diagnoses Final diagnoses:  SBO (small bowel obstruction) (HCC)   The patient appears reasonably stabilized for admission considering the current resources, flow, and  capabilities available in the ED at this time, and I doubt any other [facility] requiring further screening and/or treatment in the ED prior to admission.   Rx / DC Orders ED Discharge Orders     None         Terel Bann, MD 10/31/22 [phone]

## 2022-10-31 NOTE — Consult Note (Signed)
Leslie Farley May 08, 1930  [id number].    Chief Complaint/Reason for Consult: SBO  HPI:  89 y.o. female with medical history significant for chronic diastolic CHF, hypertension, advanced dementia who presented from SNF due to reportedly vomiting coffee-ground emesis and severely distended abdomen.  She is a poor historian and unable to provide details.  CT showed bowel dilation with c/f pSBO.  An NG was placed and she was admitted to the floor. She is AF and HDS. WBC 7.  Hb 11-->10.    ROS: Unable to be obtained due to patient's dementia  Family History  Problem Relation Age of Onset   Cancer - Other Mother        Died of throat cancer   Cancer - Prostate Father    Diabetes Brother     Past Medical History:  Diagnosis Date   Arthritis    Asthma    COPD (chronic obstructive pulmonary disease) (HCC)    GERD (gastroesophageal reflux disease)    Hypertension     Past Surgical History:  Procedure Laterality Date   CHOLECYSTECTOMY      Social History:  reports that she quit smoking about 51 years ago. She has never used smokeless tobacco. She reports that she does not drink alcohol and does not use drugs.  Allergies:  Allergies  Allergen Reactions   Cucumber Extract     Medications Prior to Admission  Medication Sig Dispense Refill   acetaminophen (TYLENOL) 500 MG tablet Take 2 tablets (1,000 mg total) by mouth every 6 (six) hours as needed for mild pain or moderate pain. 30 tablet 0   ADVAIR DISKUS 100-50 MCG/ACT AEPB Inhale 1 puff into the lungs every 12 (twelve) hours.     albuterol (VENTOLIN HFA) 108 (90 Base) MCG/ACT inhaler Inhale 2 puffs into the lungs every 6 (six) hours as needed for wheezing or shortness of breath.     Amino Acids-Protein Hydrolys (FEEDING SUPPLEMENT, PRO-STAT SUGAR FREE 64,) LIQD Take 30 mLs by mouth in the morning and at bedtime.     amLODipine (NORVASC) 5 MG tablet Take 1 tablet (5 mg total) by mouth daily. 30 tablet 0   bisacodyl  (DULCOLAX) 10 MG suppository Place 1 suppository (10 mg total) rectally daily as needed for moderate constipation. 12 suppository 0   Cholecalciferol (VITAMIN D) 50 MCG (2000 UT) tablet Take 2,000 Units by mouth daily.     cycloSPORINE (RESTASIS) 0.05 % ophthalmic emulsion Place 1 drop into both eyes 2 (two) times daily.     famotidine (PEPCID) 20 MG tablet Take 1 tablet (20 mg total) by mouth daily.     ipratropium-albuterol (DUONEB) 0.5-2.5 (3) MG/3ML SOLN Take 3 mLs by nebulization every 6 (six) hours as needed. 360 mL 0   ipratropium-albuterol (DUONEB) 0.5-2.5 (3) MG/3ML SOLN Take 3 mLs by nebulization 2 (two) times daily.     melatonin 3 MG TABS tablet Take 6 mg by mouth at bedtime.     Multiple Vitamins-Minerals (CERTAVITE/ANTIOXIDANTS PO) Take 1 tablet by mouth daily.     nitroGLYCERIN (NITROSTAT) 0.4 MG SL tablet Place 0.4 mg under the tongue every 5 (five) minutes as needed for chest pain (Do not exceed 3 doses per episode.).     polyethylene glycol (MIRALAX / GLYCOLAX) packet Take 17 g by mouth daily.     Propylene Glycol (SYSTANE BALANCE) 0.6 % SOLN Place 1 drop into both eyes in the morning and at bedtime.     Psyllium (FP FIBER LAXATIVE PO)  Take 500 mg by mouth 2 (two) times daily.     Zinc Oxide (Z-BUM) 22 % CREA Apply 1 Application topically in the morning and at bedtime. Apply to inner thighs and perineal area twice a day.     QUEtiapine (SEROQUEL) 25 MG tablet Take 1 tablet (25 mg total) by mouth at bedtime. 30 tablet 0   senna-docusate (SENOKOT-S) 8.6-50 MG tablet Take 2 tablets by mouth at bedtime.     tiZANidine (ZANAFLEX) 2 MG tablet Take 1 tablet (2 mg total) by mouth at bedtime. After completing cipro 30 tablet 0    Physical Exam: Blood pressure (!) 171/78, pulse 79, temperature 98.6 F (37 C), resp. rate 16, SpO2 94%. Gen: elderly female, NAD Resp: no increased WOB CV: RRR Abd: soft, distended, tympanic, minimal reaction to palpation, no peritoneal signs Neuro: moving  all extremities  Results for orders placed or performed during the hospital encounter of 10/30/22 (from the past 48 hour(s))  POC occult blood, ED Provider will collect     Status: Abnormal   Collection Time: 10/30/22 11:19 PM  Result Value Ref Range   Fecal Occult Bld POSITIVE (A) NEGATIVE  CBC with Differential     Status: Abnormal   Collection Time: 10/31/22 12:55 AM  Result Value Ref Range   WBC 7.6 4.0 - 10.5 K/uL   RBC 3.60 (L) 3.87 - 5.11 MIL/uL   Hemoglobin 11.1 (L) 12.0 - 15.0 g/dL   HCT 01.0 (L) 93.2 - 35.5 %   MCV 97.5 80.0 - 100.0 fL   MCH 30.8 26.0 - 34.0 pg   MCHC 31.6 30.0 - 36.0 g/dL   RDW 73.2 20.2 - 54.2 %   Platelets 301 150 - 400 K/uL   nRBC 0.0 0.0 - 0.2 %   Neutrophils Relative % 77 %   Neutro Abs 5.8 1.7 - 7.7 K/uL   Lymphocytes Relative 17 %   Lymphs Abs 1.3 0.7 - 4.0 K/uL   Monocytes Relative 6 %   Monocytes Absolute 0.5 0.1 - 1.0 K/uL   Eosinophils Relative 0 %   Eosinophils Absolute 0.0 0.0 - 0.5 K/uL   Basophils Relative 0 %   Basophils Absolute 0.0 0.0 - 0.1 K/uL   Immature Granulocytes 0 %   Abs Immature Granulocytes 0.02 0.00 - 0.07 K/uL    Comment: Performed at [facility], [address], [place], [place] [address]  Basic metabolic panel     Status: Abnormal   Collection Time: 10/31/22 12:55 AM  Result Value Ref Range   Sodium 138 135 - 145 mmol/L   Potassium 3.7 3.5 - 5.1 mmol/L   Chloride 97 (L) 98 - 111 mmol/L   CO2 33 (H) 22 - 32 mmol/L   Glucose, Bld 125 (H) 70 - 99 mg/dL    Comment: Glucose reference range applies only to samples taken after fasting for at least 8 hours.   BUN 19 8 - 23 mg/dL   Creatinine, Ser 7.62 0.44 - 1.00 mg/dL   Calcium 9.2 8.9 - 83.1 mg/dL   GFR, Estimated >51 >76 mL/min    Comment: (NOTE) Calculated using the CKD-EPI Creatinine Equation (2021)    Anion gap 8 5 - 15    Comment: Performed at [facility], [address] [place], [place] [address]  Type and screen  [facility]     Status: None   Collection Time: 10/31/22 12:55 AM  Result Value Ref Range   ABO/RH(D) B POS  Antibody Screen NEG    Sample Expiration      11/03/2022,2359 Performed at [facility], [address], [place], [place] [address]   ABO/Rh     Status: None   Collection Time: 10/31/22 12:57 AM  Result Value Ref Range   ABO/RH(D)      B POS Performed at [facility], [address], [place], [place] [address]   Urinalysis, Routine w reflex microscopic -Urine, Clean Catch     Status: Abnormal   Collection Time: 10/31/22  2:35 AM  Result Value Ref Range   Color, Urine YELLOW YELLOW   APPearance CLEAR CLEAR   Specific Gravity, Urine 1.027 1.005 - 1.030   pH 5.0 5.0 - 8.0   Glucose, UA NEGATIVE NEGATIVE mg/dL   Hgb urine dipstick NEGATIVE NEGATIVE   Bilirubin Urine NEGATIVE NEGATIVE   Ketones, ur NEGATIVE NEGATIVE mg/dL   Protein, ur 30 (A) NEGATIVE mg/dL   Nitrite NEGATIVE NEGATIVE   Leukocytes,Ua NEGATIVE NEGATIVE   RBC / HPF 0-5 0 - 5 RBC/hpf   WBC, UA 0-5 0 - 5 WBC/hpf   Bacteria, UA NONE SEEN NONE SEEN   Squamous Epithelial / HPF 0-5 0 - 5 /HPF   Mucus PRESENT     Comment: Performed at [facility], [address] [place], [place] [address]  Hemoglobin and hematocrit, blood     Status: Abnormal   Collection Time: 10/31/22  6:47 AM  Result Value Ref Range   Hemoglobin 10.1 (L) 12.0 - 15.0 g/dL   HCT 38.1 (L) 82.9 - 93.7 %    Comment: Performed at [facility], [address] [place], [place] [address]   DG Abd Portable 1 View  Result Date: 10/31/2022 CLINICAL DATA:  89 year old female status post nasogastric tube placement. EXAM: PORTABLE ABDOMEN - 1 VIEW COMPARISON:  Abdominal radiograph 10/13/2022. FINDINGS: Nasogastric tube in position with tip in the mid body of the stomach and side port just distal to the gastroesophageal junction. Visualized portions of the  upper abdomen again demonstrate numerous gas-filled loops of bowel, some of which appear dilated (poorly evaluated on this single view examination which excludes the majority of the lower 2/3 of the abdomen). IMPRESSION: 1. Tip of nasogastric tube is in the body of the stomach with side port just distal to the gastroesophageal junction. 2. Persistent bowel dilatation, likely reflective of small bowel obstruction as demonstrated on contemporaneously obtained CT of the abdomen and pelvis. Electronically Signed   By: Trudie Reed M.D.   On: 10/31/2022 06:06   CT ABDOMEN PELVIS W CONTRAST  Result Date: 10/31/2022 CLINICAL DATA:  Cough for ground emesis and abdominal distension. EXAM: CT ABDOMEN AND PELVIS WITH CONTRAST TECHNIQUE: Multidetector CT imaging of the abdomen and pelvis was performed using the standard protocol following bolus administration of intravenous contrast. RADIATION DOSE REDUCTION: This exam was performed according to the departmental dose-optimization program which includes automated exposure control, adjustment of the mA and/or kV according to patient size and/or use of iterative reconstruction technique. CONTRAST:  OMNIPAQUE IOHEXOL 300 MG/ML  SOLN COMPARISON:  October 13, 2022 FINDINGS: Lower chest: There is a small right pleural effusion. This is mildly increased in size when compared to the prior exam. Hepatobiliary: No focal liver abnormality is seen. Status post cholecystectomy. No biliary dilatation. Pancreas: Unremarkable. No pancreatic ductal dilatation or surrounding inflammatory changes. Spleen: Normal in size without focal abnormality. Adrenals/Urinary Tract: Adrenal glands are unremarkable. Kidneys are normal, without renal  calculi, focal lesion, or hydronephrosis. The urinary bladder is poorly distended and subsequently limited in evaluation. Stomach/Bowel: Stomach is within normal limits. The appendix is normal. Numerous dilated small bowel loops are seen throughout the  abdomen and pelvis (maximum small bowel diameter of approximately 4.3 cm). A transition zone is seen at the level of the terminal ileum (axial CT images 32 through 34, CT series 4). Noninflamed diverticula are seen throughout the large bowel. Persistently distended mid to distal sigmoid colon and rectum is noted. Vascular/Lymphatic: Aortic atherosclerosis. No enlarged abdominal or pelvic lymph nodes. Reproductive: Uterus and bilateral adnexa are unremarkable. Other: No abdominal wall hernia or abnormality. No abdominopelvic ascites. Musculoskeletal: Multilevel chronic and degenerative changes seen throughout the lumbar spine. IMPRESSION: 1. Findings consistent with a partial small bowel obstruction with a transition zone at the level of the terminal ileum. 2. Colonic diverticulosis. 3. Small right pleural effusion. 4. Aortic atherosclerosis. Aortic Atherosclerosis (ICD10-I70.0). Electronically Signed   By: Aram Candela M.D.   On: 10/31/2022 03:24   DG Chest 1 View  Result Date: 10/31/2022 CLINICAL DATA:  Coffee brown emesis and abdominal distension. EXAM: CHEST  1 VIEW COMPARISON:  October 13, 2022 FINDINGS: The heart size and mediastinal contours are within normal limits. There is moderate severity calcification of the aortic arch. Both lungs are clear. Degenerative changes are seen involving the right shoulder and throughout the thoracic spine. IMPRESSION: No active disease. Electronically Signed   By: Aram Candela M.D.   On: 10/31/2022 03:16    Assessment/Plan 89 y/o F who presents with coffee ground emesis w/ CT showing pSBO  - No indication for urgent surgical intervention - Will plan for small bowel protocol  - NGT to LWS - GI consult to evaluate for GIB - Surgery will continue to follow  I reviewed last 24 h vitals and pain scores, last 24 h labs and trends, and last 24 h imaging results.  [facility] 10/31/2022, 11:41 AM Please see Amion for pager  number during day hours 7:00am-4:30pm or 7:00am -11:30am on weekends

## 2022-10-31 NOTE — H&P (Addendum)
History and Physical  Leslie Farley WUX:[id number] DOB: [birth year] DOA: 10/30/2022  Referring physician: Dr. Nicanor Alcon, EDP  PCP: Pcp, No  Outpatient Specialists: Palliative care medicine. Patient coming from: SNF  Chief Complaint: Emesis.  HPI: Leslie Farley is a 89 y.o. female with medical history significant for chronic diastolic CHF, hypertension, advanced dementia recently enrolled in hospice care who presents from SNF due to reportedly vomiting coffee-ground emesis.  Associated with severely distended abdomen.  The patient is unable to provide a history.  She denies having any abdominal pain.  In the ED, CT abdomen and pelvis with contrast revealed findings suggestive of partial small bowel obstruction with a transition zone at the level of the terminal ileum, small right pleural effusion.  The patient's daughter at bedside wants NG tube placed.  NG tube was placed in the ED to suction.  Large amount of fluid removed from severely distended abdomen.  The patient was admitted by [employer], hospitalist service.  ED Course: Temperature 98.4.  BP 130/58, pulse 83, respiratory 17, saturation 97% on room air.  Review of Systems: Review of systems as noted in the HPI. All other systems reviewed and are negative.   Past Medical History:  Diagnosis Date   Arthritis    Asthma    COPD (chronic obstructive pulmonary disease) (HCC)    GERD (gastroesophageal reflux disease)    Hypertension    Past Surgical History:  Procedure Laterality Date   CHOLECYSTECTOMY      Social History:  reports that she quit smoking about 51 years ago. She has never used smokeless tobacco. She reports that she does not drink alcohol and does not use drugs.   Allergies  Allergen Reactions   Cucumber Extract     Family History  Problem Relation Age of Onset   Cancer - Other Mother        Died of throat cancer   Cancer - Prostate Father    Diabetes Brother       Prior to Admission medications    Medication Sig Start Date End Date Taking? Authorizing Provider  acetaminophen (TYLENOL) 500 MG tablet Take 2 tablets (1,000 mg total) by mouth every 6 (six) hours as needed for mild pain or moderate pain. 11/20/16  Yes Cheron Schaumann K, PA-C  ADVAIR DISKUS 100-50 MCG/ACT AEPB Inhale 1 puff into the lungs every 12 (twelve) hours. 03/26/22  Yes [provider]  albuterol (VENTOLIN HFA) 108 (90 Base) MCG/ACT inhaler Inhale 2 puffs into the lungs every 6 (six) hours as needed for wheezing or shortness of breath. 04/01/22  Yes Pokhrel, Laxman, MD  Amino Acids-Protein Hydrolys (FEEDING SUPPLEMENT, PRO-STAT SUGAR FREE 64,) LIQD Take 30 mLs by mouth in the morning and at bedtime.   Yes [provider]  amLODipine (NORVASC) 5 MG tablet Take 1 tablet (5 mg total) by mouth daily. 09/23/22  Yes Regalado, Belkys A, MD  bisacodyl (DULCOLAX) 10 MG suppository Place 1 suppository (10 mg total) rectally daily as needed for moderate constipation. 09/22/22  Yes Regalado, Belkys A, MD  Cholecalciferol (VITAMIN D) 50 MCG (2000 UT) tablet Take 2,000 Units by mouth daily.   Yes [provider]  cycloSPORINE (RESTASIS) 0.05 % ophthalmic emulsion Place 1 drop into both eyes 2 (two) times daily.   Yes [provider]  famotidine (PEPCID) 20 MG tablet Take 1 tablet (20 mg total) by mouth daily. 04/02/22  Yes Pokhrel, Laxman, MD  ipratropium-albuterol (DUONEB) 0.5-2.5 (3) MG/3ML SOLN Take 3 mLs by nebulization every 6 (six)  hours as needed. 09/22/22  Yes Regalado, Belkys A, MD  ipratropium-albuterol (DUONEB) 0.5-2.5 (3) MG/3ML SOLN Take 3 mLs by nebulization 2 (two) times daily.   Yes [provider]  melatonin 3 MG TABS tablet Take 6 mg by mouth at bedtime.   Yes [provider]  Multiple Vitamins-Minerals (CERTAVITE/ANTIOXIDANTS PO) Take 1 tablet by mouth daily.   Yes [provider]  nitroGLYCERIN (NITROSTAT) 0.4 MG SL tablet Place 0.4 mg under the tongue every 5 (five)  minutes as needed for chest pain (Do not exceed 3 doses per episode.).   Yes [provider]  polyethylene glycol (MIRALAX / GLYCOLAX) packet Take 17 g by mouth daily.   Yes [provider]  Propylene Glycol (SYSTANE BALANCE) 0.6 % SOLN Place 1 drop into both eyes in the morning and at bedtime.   Yes [provider]  Psyllium (FP FIBER LAXATIVE PO) Take 500 mg by mouth 2 (two) times daily.   Yes [provider]  Zinc Oxide (Z-BUM) 22 % CREA Apply 1 Application topically in the morning and at bedtime. Apply to inner thighs and perineal area twice a day.   Yes [provider]  QUEtiapine (SEROQUEL) 25 MG tablet Take 1 tablet (25 mg total) by mouth at bedtime. 09/22/22   Regalado, Belkys A, MD  senna-docusate (SENOKOT-S) 8.6-50 MG tablet Take 2 tablets by mouth at bedtime.    [provider]  tiZANidine (ZANAFLEX) 2 MG tablet Take 1 tablet (2 mg total) by mouth at bedtime. After completing cipro 04/06/22   Pokhrel, Rebekah [place], MD    Physical Exam: BP (!) 130/58 (BP Location: Right Arm)   Pulse 83   Temp 98.4 F (36.9 C) (Oral)   Resp 17   SpO2 97%   General: 89 y.o. year-old female well developed well nourished in no acute distress.  Alert and confused in the setting of advanced dementia. Cardiovascular: Regular rate and rhythm with no rubs or gallops.  No thyromegaly or JVD noted.  No lower extremity edema. 2/4 pulses in all 4 extremities. Respiratory: Clear to auscultation with no wheezes or rales. Good inspiratory effort. Abdomen: Severely distended with hypoactive bowel sounds x4 quadrants. Muskuloskeletal: No cyanosis, clubbing or edema noted bilaterally Neuro: CN II-XII intact, strength, sensation, reflexes Skin: No ulcerative lesions noted or rashes Psychiatry: Judgement and insight appear altered. Mood is appropriate for condition and setting          Labs on Admission:  Basic Metabolic Panel: Recent Labs  Lab 10/31/22 0055  NA  138  K 3.7  CL 97*  CO2 33*  GLUCOSE 125*  BUN 19  CREATININE 0.71  CALCIUM 9.2   Liver Function Tests: No results for input(s): "AST", "ALT", "ALKPHOS", "BILITOT", "PROT", "ALBUMIN" in the last 168 hours. No results for input(s): "LIPASE", "AMYLASE" in the last 168 hours. No results for input(s): "AMMONIA" in the last 168 hours. CBC: Recent Labs  Lab 10/31/22 0055  WBC 7.6  NEUTROABS 5.8  HGB 11.1*  HCT 35.1*  MCV 97.5  PLT 301   Cardiac Enzymes: No results for input(s): "CKTOTAL", "CKMB", "CKMBINDEX", "TROPONINI" in the last 168 hours.  BNP (last 3 results) Recent Labs    10/14/22 0053 10/15/22 0235 10/16/22 0711  BNP 128.8* 125.2* 114.5*    ProBNP (last 3 results) No results for input(s): "PROBNP" in the last 8760 hours.  CBG: No results for input(s): "GLUCAP" in the last 168 hours.  Radiological Exams on Admission: CT ABDOMEN PELVIS W CONTRAST  Result Date: 10/31/2022 CLINICAL DATA:  Cough for ground emesis and abdominal distension. EXAM: CT ABDOMEN AND PELVIS WITH CONTRAST TECHNIQUE: Multidetector CT imaging of the abdomen and pelvis was performed using the standard protocol following bolus administration of intravenous contrast. RADIATION DOSE REDUCTION: This exam was performed according to the departmental dose-optimization program which includes automated exposure control, adjustment of the mA and/or kV according to patient size and/or use of iterative reconstruction technique. CONTRAST:  OMNIPAQUE IOHEXOL 300 MG/ML  SOLN COMPARISON:  October 13, 2022 FINDINGS: Lower chest: There is a small right pleural effusion. This is mildly increased in size when compared to the prior exam. Hepatobiliary: No focal liver abnormality is seen. Status post cholecystectomy. No biliary dilatation. Pancreas: Unremarkable. No pancreatic ductal dilatation or surrounding inflammatory changes. Spleen: Normal in size without focal abnormality. Adrenals/Urinary Tract: Adrenal glands  are unremarkable. Kidneys are normal, without renal calculi, focal lesion, or hydronephrosis. The urinary bladder is poorly distended and subsequently limited in evaluation. Stomach/Bowel: Stomach is within normal limits. The appendix is normal. Numerous dilated small bowel loops are seen throughout the abdomen and pelvis (maximum small bowel diameter of approximately 4.3 cm). A transition zone is seen at the level of the terminal ileum (axial CT images 32 through 34, CT series 4). Noninflamed diverticula are seen throughout the large bowel. Persistently distended mid to distal sigmoid colon and rectum is noted. Vascular/Lymphatic: Aortic atherosclerosis. No enlarged abdominal or pelvic lymph nodes. Reproductive: Uterus and bilateral adnexa are unremarkable. Other: No abdominal wall hernia or abnormality. No abdominopelvic ascites. Musculoskeletal: Multilevel chronic and degenerative changes seen throughout the lumbar spine. IMPRESSION: 1. Findings consistent with a partial small bowel obstruction with a transition zone at the level of the terminal ileum. 2. Colonic diverticulosis. 3. Small right pleural effusion. 4. Aortic atherosclerosis. Aortic Atherosclerosis (ICD10-I70.0). Electronically Signed   By: Aram Candela M.D.   On: 10/31/2022 03:24   DG Chest 1 View  Result Date: 10/31/2022 CLINICAL DATA:  Coffee brown emesis and abdominal distension. EXAM: CHEST  1 VIEW COMPARISON:  October 13, 2022 FINDINGS: The heart size and mediastinal contours are within normal limits. There is moderate severity calcification of the aortic arch. Both lungs are clear. Degenerative changes are seen involving the right shoulder and throughout the thoracic spine. IMPRESSION: No active disease. Electronically Signed   By: Aram Candela M.D.   On: 10/31/2022 03:16    EKG: I independently viewed the EKG done and my findings are as followed: None available at the time of density.  Assessment/Plan Present on Admission:   Partial small bowel obstruction (HCC)  Principal Problem:   Partial small bowel obstruction (HCC)  Partial small bowel obstruction status post NG tube placement to suction CT abdomen and pelvis with contrast revealed findings suggestive of partial small bowel obstruction with a transition zone at the level of the terminal ileum, small right pleural effusion.  The patient's daughter at bedside wants NG tube placed.  NG tube was placed in the ED to suction.  Large amount of fluid removed from severely distended abdomen. IV fluid hydration Replete electrolytes as indicated, optimize magnesium and potassium levels Per the patient's daughter at bedside the patient is nonambulatory.  Reported ground coffee emesis with positive FOBT Add IV Protonix 40 mg twice daily Serial H&H every 6 hours Transfuse hemoglobin less than 8.0 Maintain MAP greater than 65  Dementia, recently enrolled in hospice care Palliative care medicine consulted. DNR/DNI  Chronic diastolic CHF Closely monitor volume status while on  IV fluid No evidence of pulmonary edema on chest x-ray, O2 saturation 97% on room air Start strict I's and O's and daily weight  Hypertension BP is not at goal, elevated IV hydralazine as needed with parameters Closely monitor vital signs  Ambulatory dysfunction Per her daughter at bedside she is nonambulatory Fall precautions   Time: 75 minutes.   DVT prophylaxis: SCDs  Code Status: DNR/DNI  Family Communication: Updated the patient's daughter at bedside  Disposition Plan: Admitted to telemetry unit  Consults called: Palliative care medicine  Admission status: Inpatient status.   Status is: Inpatient The patient requires at least 2 midnights for further evaluation and treatment of present condition.   Darlin Drop MD [facility] Pager [phone]  If 7PM-7AM, please contact night-coverage [email or web address] Password [facility]  10/31/2022, 5:55 AM

## 2022-11-01 ENCOUNTER — Inpatient Hospital Stay (HOSPITAL_COMMUNITY)

## 2022-11-01 DIAGNOSIS — K56600 Partial intestinal obstruction, unspecified as to cause: Secondary | ICD-10-CM | POA: Diagnosis not present

## 2022-11-01 LAB — COMPREHENSIVE METABOLIC PANEL
ALT: 41 U/L (ref 0–44)
AST: 42 U/L — ABNORMAL HIGH (ref 15–41)
Albumin: 3.1 g/dL — ABNORMAL LOW (ref 3.5–5.0)
Alkaline Phosphatase: 57 U/L (ref 38–126)
Anion gap: 14 (ref 5–15)
BUN: 17 mg/dL (ref 8–23)
CO2: 30 mmol/L (ref 22–32)
Calcium: 9.5 mg/dL (ref 8.9–10.3)
Chloride: 99 mmol/L (ref 98–111)
Creatinine, Ser: 0.57 mg/dL (ref 0.44–1.00)
GFR, Estimated: >60 mL/min (ref >60)
Glucose, Bld: 99 mg/dL (ref 70–99)
Potassium: 3.5 mmol/L (ref 3.5–5.1)
Sodium: 143 mmol/L (ref 135–145)
Total Bilirubin: 0.6 mg/dL (ref 0.3–1.2)
Total Protein: 6.5 g/dL (ref 6.5–8.1)

## 2022-11-01 LAB — CBC
HCT: 28.9 % — ABNORMAL LOW (ref 36.0–46.0)
Hemoglobin: 9.1 g/dL — ABNORMAL LOW (ref 12.0–15.0)
MCH: 31.2 pg (ref 26.0–34.0)
MCHC: 31.5 g/dL (ref 30.0–36.0)
MCV: 99 fL (ref 80.0–100.0)
Platelets: 255 10*3/uL (ref 150–400)
RBC: 2.92 MIL/uL — ABNORMAL LOW (ref 3.87–5.11)
RDW: 14.6 % (ref 11.5–15.5)
WBC: 6.9 10*3/uL (ref 4.0–10.5)
nRBC: 0 % (ref 0.0–0.2)

## 2022-11-01 LAB — HEMOGLOBIN AND HEMATOCRIT, BLOOD
HCT: 30.6 % — ABNORMAL LOW (ref 36.0–46.0)
Hemoglobin: 9.7 g/dL — ABNORMAL LOW (ref 12.0–15.0)

## 2022-11-01 LAB — MAGNESIUM: Magnesium: 1.9 mg/dL (ref 1.7–2.4)

## 2022-11-01 LAB — PHOSPHORUS: Phosphorus: 2.7 mg/dL (ref 2.5–4.6)

## 2022-11-01 MED ORDER — HYDRALAZINE HCL 20 MG/ML IJ SOLN
5.0000 mg | Freq: Four times a day (QID) | INTRAMUSCULAR | Status: DC | PRN
  Administered 2022-11-02: 10 mg via INTRAVENOUS
  Administered 2022-11-02: 5 mg via INTRAVENOUS
  Administered 2022-11-03: 10 mg via INTRAVENOUS
  Administered 2022-11-03 – 2022-11-06 (×2): 5 mg via INTRAVENOUS
  Administered 2022-11-07: 10 mg via INTRAVENOUS
  Administered 2022-11-07: 5 mg via INTRAVENOUS
  Filled 2022-11-01 (×8): qty 1

## 2022-11-01 MED ORDER — DIATRIZOATE MEGLUMINE & SODIUM 66-10 % PO SOLN
90.0000 mL | Freq: Once | ORAL | Status: AC
  Administered 2022-11-01: 90 mL via NASOGASTRIC
  Filled 2022-11-01: qty 90

## 2022-11-01 MED ORDER — HYDRALAZINE HCL 20 MG/ML IJ SOLN
5.0000 mg | Freq: Once | INTRAMUSCULAR | Status: AC
  Administered 2022-11-01: 5 mg via INTRAVENOUS
  Filled 2022-11-01: qty 1

## 2022-11-01 NOTE — Consult Note (Signed)
Consultation Note Date: 11/01/2022   Patient Name: Leslie Farley  DOB: March 06, 1930  MRN: [id number]  Age / Sex: 89 y.o., female  PCP: Pcp, No Referring Physician: Lanae Boast, MD  Reason for Consultation: Establishing goals of care  HPI/Patient Profile: 89 y.o. female   admitted on 10/30/2022    Clinical Assessment and Goals of Care: 89 year old lady who lives at [facility], was enrolled with hospice services about a week ago.  Known to palliative service-seen by various providers in previous hospitalization. Past medical history of chronic diastolic CHF, hypertension, dementia, admitted with reports of coffee-ground emesis and severely distended abdomen. Remains admitted to hospital medicine service with general surgery colleagues following.  Abdominal imaging shows a partial small bowel obstruction, patient with significant abdominal distention.  NG tube decompression and IV fluids and supportive care is being done.  Repeat Gastrografin to be done on 11-01-2022.  GI services to also be consulted if there is further drop in hemoglobin.  Concern for GI bleed. Palliative consult for ongoing goals of care discussions has been requested. Chart reviewed, patient seen and examined, call placed and was able to reach daughter [name].  Palliative medicine is specialized medical care for people living with serious illness. It focuses on providing relief from the symptoms and stress of a serious illness. The goal is to improve quality of life for both the patient and the family. Goals of care: Broad aims of medical therapy in relation to the patient's values and preferences. Our aim is to provide medical care aimed at enabling patients to achieve the goals that matter most to them, given the circumstances of their particular medical situation and their constraints.    NEXT OF KIN Daughter [name]  [phone]  SUMMARY OF RECOMMENDATIONS   Goals of care discussions: Discussed in detail with patient's daughter [name].  She wishes to continue scope of current hospitalization.  At this time, goals are not for comfort-focused care, family desires reasonable procedures and interventions aimed at stabilization/recovery.  We are going to do a time-limited trial of current interventions for the next few days or so and then reevaluate with further goals of care discussions. PMT to follow DNR Will recommend continuation of hospice services at patient's long-term care facility-[facility].   Code Status/Advance Care Planning: DNR   Symptom Management:     Palliative Prophylaxis:  Delirium Protocol   Psycho-social/Spiritual:  Desire for further Chaplaincy support:yes Additional Recommendations: Caregiving  Support/Resources  Prognosis:  < 6 months  Discharge Planning: Skilled Nursing Facility with Hospice      Primary Diagnoses: Present on Admission:  Partial small bowel obstruction (HCC)   I have reviewed the medical record, interviewed the patient and family, and examined the patient. The following aspects are pertinent.  Past Medical History:  Diagnosis Date   Arthritis    Asthma    COPD (chronic obstructive pulmonary disease) (HCC)    GERD (gastroesophageal reflux disease)    Hypertension    Social History   Socioeconomic History   Marital  status: Single    Spouse name: Not on file   Number of children: Not on file   Years of education: Not on file   Highest education level: Not on file  Occupational History   Not on file  Tobacco Use   Smoking status: Former    Current packs/day: 0.00    Types: Cigarettes    Quit date: 02/24/1971    Years since quitting: 51.7   Smokeless tobacco: Never  Substance and Sexual Activity   Alcohol use: No   Drug use: No   Sexual activity: Not Currently  Other Topics Concern   Not on file  Social History Narrative   Not on  file   Social Determinants of Health   Financial Resource Strain: Not on file  Food Insecurity: No Food Insecurity (09/15/2022)   Hunger Vital Sign    Worried About Running Out of Food in the Last Year: Never true    Ran Out of Food in the Last Year: Never true  Transportation Needs: No Transportation Needs (09/15/2022)   PRAPARE - Administrator, Civil Service (Medical): No    Lack of Transportation (Non-Medical): No  Physical Activity: Not on file  Stress: Not on file  Social Connections: Not on file   Family History  Problem Relation Age of Onset   Cancer - Other Mother        Died of throat cancer   Cancer - Prostate Father    Diabetes Brother    Scheduled Meds:  cycloSPORINE  1 drop Both Eyes BID   mometasone-formoterol  2 puff Inhalation BID   pantoprazole (PROTONIX) IV  40 mg Intravenous BID   polyvinyl alcohol  1 drop Both Eyes BID   Continuous Infusions:  lactated ringers 100 mL/hr at 11/01/22 0902   PRN Meds:.albuterol, hydrALAZINE, ipratropium-albuterol, ipratropium-albuterol, nitroGLYCERIN Medications Prior to Admission:  Prior to Admission medications   Medication Sig Start Date End Date Taking? Authorizing Provider  acetaminophen (TYLENOL) 500 MG tablet Take 2 tablets (1,000 mg total) by mouth every 6 (six) hours as needed for mild pain or moderate pain. 11/20/16  Yes Cheron Schaumann K, PA-C  ADVAIR DISKUS 100-50 MCG/ACT AEPB Inhale 1 puff into the lungs every 12 (twelve) hours. 03/26/22  Yes [provider]  albuterol (VENTOLIN HFA) 108 (90 Base) MCG/ACT inhaler Inhale 2 puffs into the lungs every 6 (six) hours as needed for wheezing or shortness of breath. 04/01/22  Yes Pokhrel, Laxman, MD  Amino Acids-Protein Hydrolys (FEEDING SUPPLEMENT, PRO-STAT SUGAR FREE 64,) LIQD Take 30 mLs by mouth in the morning and at bedtime.   Yes [provider]  amLODipine (NORVASC) 5 MG tablet Take 1 tablet (5 mg total) by mouth daily. 09/23/22  Yes Regalado,  Belkys A, MD  bisacodyl (DULCOLAX) 10 MG suppository Place 1 suppository (10 mg total) rectally daily as needed for moderate constipation. 09/22/22  Yes Regalado, Belkys A, MD  Cholecalciferol (VITAMIN D) 50 MCG (2000 UT) tablet Take 2,000 Units by mouth daily.   Yes [provider]  cycloSPORINE (RESTASIS) 0.05 % ophthalmic emulsion Place 1 drop into both eyes 2 (two) times daily.   Yes [provider]  famotidine (PEPCID) 20 MG tablet Take 1 tablet (20 mg total) by mouth daily. 04/02/22  Yes Pokhrel, Laxman, MD  ipratropium-albuterol (DUONEB) 0.5-2.5 (3) MG/3ML SOLN Take 3 mLs by nebulization every 6 (six) hours as needed. 09/22/22  Yes Regalado, Belkys A, MD  ipratropium-albuterol (DUONEB) 0.5-2.5 (3) MG/3ML SOLN Take  3 mLs by nebulization 2 (two) times daily.   Yes [provider]  melatonin 3 MG TABS tablet Take 6 mg by mouth at bedtime.   Yes [provider]  Multiple Vitamins-Minerals (CERTAVITE/ANTIOXIDANTS PO) Take 1 tablet by mouth daily.   Yes [provider]  nitroGLYCERIN (NITROSTAT) 0.4 MG SL tablet Place 0.4 mg under the tongue every 5 (five) minutes as needed for chest pain (Do not exceed 3 doses per episode.).   Yes [provider]  polyethylene glycol (MIRALAX / GLYCOLAX) packet Take 17 g by mouth daily.   Yes [provider]  Propylene Glycol (SYSTANE BALANCE) 0.6 % SOLN Place 1 drop into both eyes in the morning and at bedtime.   Yes [provider]  Psyllium (FP FIBER LAXATIVE PO) Take 500 mg by mouth 2 (two) times daily.   Yes [provider]  Zinc Oxide (Z-BUM) 22 % CREA Apply 1 Application topically in the morning and at bedtime. Apply to inner thighs and perineal area twice a day.   Yes [provider]  QUEtiapine (SEROQUEL) 25 MG tablet Take 1 tablet (25 mg total) by mouth at bedtime. 09/22/22   Regalado, Belkys A, MD  senna-docusate (SENOKOT-S) 8.6-50 MG tablet Take 2 tablets by mouth at  bedtime.    [provider]  tiZANidine (ZANAFLEX) 2 MG tablet Take 1 tablet (2 mg total) by mouth at bedtime. After completing cipro 04/06/22   Pokhrel, Rebekah Chesterfield, MD   Allergies  Allergen Reactions   Cucumber Extract    Review of Systems Confused, mumbling to herself. Physical Exam Confused, mumbling to herself Bearing mittens both upper extremities Absent abdominal sounds Awake but not alert  Vital Signs: BP (!) 198/97 (BP Location: Left Arm)   Pulse 81   Temp 97.9 F (36.6 C) (Oral)   Resp 14   Wt 86.1 kg   SpO2 92%   BMI 31.59 kg/m  Pain Scale: Faces   Pain Score: 0-No pain   SpO2: SpO2: 92 % O2 Device:SpO2: 92 % O2 Flow Rate: .   IO: Intake/output summary:  Intake/Output Summary (Last 24 hours) at 11/01/2022 1352 Last data filed at 11/01/2022 0618 Gross per 24 hour  Intake 994.78 ml  Output 200 ml  Net 794.78 ml    LBM:   Baseline Weight: Weight: 86.1 kg Most recent weight: Weight: 86.1 kg     Palliative Assessment/Data:   PPS 40 %  Time In:  12.30 Time Out:  1330 Time Total:  60  Greater than 50%  of this time was spent counseling and coordinating care related to the above assessment and plan.  Signed by: Rosalin Hawking, MD   Please contact Palliative Medicine Team phone at [phone] for questions and concerns.  For individual provider: See Loretha Stapler

## 2022-11-01 NOTE — Progress Notes (Signed)
PROGRESS NOTE Leslie Farley  ZOX:[id number] DOB: 1930-10-13 DOA: 10/30/2022 PCP: Pcp, No  Brief Narrative/Hospital Course: 89 y.o. female with medical history significant for chronic diastolic CHF, hypertension, advanced dementia recently enrolled in hospice care who presents from SNF due to reportedly vomiting coffee-ground emesis and severely distended abdomen. In the ED: Afebrile vitals stable not hypoxic, labs stable renal function mild anemia CT abdomen and pelvis with contrast revealed findings suggestive of partial small bowel obstruction with a transition zone at the level of the terminal ileum, small right pleural effusion.  The patient's daughter at bedside was also requesting NG tube placement, NGT was placed following which large amount of fluid removed, and subsequently admitted for further management.       Subjective: Patient seen and examined this morning Able to respond some, he is alert awake NGT+  Overnight afebrile remains hypertensive up to 190s heart rate in 80s on room air Labs reviewed with stable CMP albumin low 3.1 hemoglobin 9.1 g stable   Assessment and Plan: Principal Problem:   Partial small bowel obstruction (HCC)  Partial small bowel obstruction Significant abdominal distention: X-ray shows diffuse dilated small bowel and no oral contrast in the small bowel or colon, repeat Gastrografin ordered today, continue NG tube decompression, IV fluids supportive care. Surgery following closely> continue plan of care as per [facility] Patient is nonambulatory and currently in hospice.  Reported coffee-ground emesis FOBT positive stool: Hb has remained stable in 9 g, monitor, continue PPI.  If further drop in hemoglobin may need GI evaluation but will hold off for now. Recent Labs  Lab 10/31/22 0055 10/31/22 0647 10/31/22 1248 10/31/22 1858 11/01/22 0435  HGB 11.1* 10.1* 9.3* 9.3* 9.1*  HCT 35.1* 32.2* 29.2* 29.6* 28.9*    Dementia: Continue supportive care  delirium precaution   Chronic diastolic CHF: Monitor volume status chest x-ray clear and not hypoxic on admission  Ambulatory dysfunction: Currently bedbound  Hypertension: BP poorly controlled overnight currently stable.  Monitor  cont PRN iv  Class I Obesity:Patient's Body mass index is 31.59 kg/m. : Will benefit with PCP follow-up, weight loss  healthy lifestyle  Goals of care: Patient is DNR currently in hospice services goal is to keep her comfortable, treat what is treatable Palliative care has been consulted  Pressure ulcer POA Pressure Injury 09/16/22 Coccyx Posterior;Mid Stage 1 -  Intact skin with non-blanchable redness of a localized area usually over a bony prominence. Ol wound healed, still not blachable (Active)  09/16/22 1240  Location: Coccyx  Location Orientation: Posterior;Mid  Staging: Stage 1 -  Intact skin with non-blanchable redness of a localized area usually over a bony prominence.  Wound Description (Comments): Ol wound healed, still not blachable  Present on Admission: Yes  Dressing Type Foam - Lift dressing to assess site every shift 10/31/22 2000   DVT prophylaxis: SCDs Start: 10/31/22 0349 Code Status:   Code Status: Limited: Do not attempt resuscitation (DNR) -DNR-LIMITED -Do Not Intubate/DNI  Family Communication: plan of care discussed with patient/RN at bedside. Patient status is: Inpatient because of bowel obstruction Level of care: Telemetry   Dispo: The patient is from: home            Anticipated disposition: TBD  Objective: Vitals last 24 hrs: Vitals:   10/31/22 1700 10/31/22 2213 11/01/22 0422 11/01/22 0500  BP:  (!) 182/103 (!) 198/97   Pulse:  85 81   Resp:  14 14   Temp:  98.6 F (37 C) 97.9 F (36.6 C)  TempSrc:  Oral Oral   SpO2: 94% 100% 100%   Weight:    86.1 kg   Weight change:   Physical Examination: General exam: alert awake, older than stated age, ill appearing HEENT:Oral mucosa moist, Ear/Nose WNL  grossly Respiratory system: bilaterally clear BS, no use of accessory muscle Cardiovascular system: S1 & S2 +, No JVD. Gastrointestinal system: Abdomen soft, appears distended bowel sounds absent  Nervous System:Alert, awake, moving extremities. Extremities: LE edema neg,distal peripheral pulses palpable.  Skin: No rashes,no icterus. MSK: Normal muscle bulk,tone, power  Medications reviewed:  Scheduled Meds:  cycloSPORINE  1 drop Both Eyes BID   mometasone-formoterol  2 puff Inhalation BID   pantoprazole (PROTONIX) IV  40 mg Intravenous BID   polyvinyl alcohol  1 drop Both Eyes BID   Continuous Infusions:  lactated ringers 100 mL/hr at 10/31/22 2321   Diet Order     None     Every morning Intake/Output Summary (Last 24 hours) at 11/01/2022 0755 Last data filed at 11/01/2022 0618 Gross per 24 hour  Intake 994.78 ml  Output 200 ml  Net 794.78 ml   Net IO Since Admission: 134.63 mL [11/01/22 0755]  Wt Readings from Last 3 Encounters:  11/01/22 86.1 kg  10/16/22 85.2 kg  09/15/22 82.4 kg     Unresulted Labs (From admission, onward)    None     Data Reviewed: I have personally reviewed following labs and imaging studies CBC: Recent Labs  Lab 10/31/22 0055 10/31/22 0647 10/31/22 1248 10/31/22 1858 11/01/22 0435  WBC 7.6  --   --   --  6.9  NEUTROABS 5.8  --   --   --   --   HGB 11.1* 10.1* 9.3* 9.3* 9.1*  HCT 35.1* 32.2* 29.2* 29.6* 28.9*  MCV 97.5  --   --   --  99.0  PLT 301  --   --   --  255   Basic Metabolic Panel: Recent Labs  Lab 10/31/22 0055 11/01/22 0435  NA 138 143  K 3.7 3.5  CL 97* 99  CO2 33* 30  GLUCOSE 125* 99  BUN 19 17  CREATININE 0.71 0.57  CALCIUM 9.2 9.5  MG  --  1.9  PHOS  --  2.7   GFR: Estimated Creatinine Clearance: 51.6 mL/min (by C-G formula based on SCr of 0.57 mg/dL). Liver Function Tests: Recent Labs  Lab 11/01/22 0435  AST 42*  ALT 41  ALKPHOS 57  BILITOT 0.6  PROT 6.5  ALBUMIN 3.1*   Antimicrobials: Anti-infectives (From admission, onward)    None      Culture/Microbiology    Component Value Date/Time   SDES URINE, CLEAN CATCH 10/11/2022 1345   SPECREQUEST  10/11/2022 1345    NONE Performed at [facility], [address], [place], [place] [address]    CULT >=100,000 COLONIES/mL KLEBSIELLA PNEUMONIAE (A) 10/11/2022 1345   REPTSTATUS 10/13/2022 FINAL 10/11/2022 1345  Radiology Studies: DG Abd Portable 1V-Small Bowel Obstruction Protocol-initial, 8 hr delay  Result Date: 11/01/2022 CLINICAL DATA:  8 hour delay small-bowel obstruction protocol. EXAM: PORTABLE ABDOMEN - 1 VIEW COMPARISON:  CT abdomen and pelvis 10/31/2022 FINDINGS: No significant oral contrast identified within small bowel or colon. Diffusely dilated small bowel loops are present measuring up to 4 cm. Contrast is seen in the bladder. IMPRESSION: 1. Diffusely dilated small bowel again seen. No significant oral contrast identified in small bowel or colon. Findings are concerning for severe ileus or small-bowel obstruction.  Electronically Signed   By: Darliss Cheney M.D.   On: 11/01/2022 00:17   DG Abd Portable 1 View  Result Date: 10/31/2022 CLINICAL DATA:  89 year old female status post nasogastric tube placement. EXAM: PORTABLE ABDOMEN - 1 VIEW COMPARISON:  Abdominal radiograph 10/13/2022. FINDINGS: Nasogastric tube in position with tip in the mid body of the stomach and side port just distal to the gastroesophageal junction. Visualized portions of the upper abdomen again demonstrate numerous gas-filled loops of bowel, some of which appear dilated (poorly evaluated on this single view examination which excludes the majority of the lower 2/3 of the abdomen). IMPRESSION: 1. Tip of nasogastric tube is in the body of the stomach with side port just distal to the gastroesophageal junction. 2. Persistent bowel dilatation, likely reflective of small bowel obstruction as demonstrated on contemporaneously  obtained CT of the abdomen and pelvis. Electronically Signed   By: Trudie Reed M.D.   On: 10/31/2022 06:06   CT ABDOMEN PELVIS W CONTRAST  Result Date: 10/31/2022 CLINICAL DATA:  Cough for ground emesis and abdominal distension. EXAM: CT ABDOMEN AND PELVIS WITH CONTRAST TECHNIQUE: Multidetector CT imaging of the abdomen and pelvis was performed using the standard protocol following bolus administration of intravenous contrast. RADIATION DOSE REDUCTION: This exam was performed according to the departmental dose-optimization program which includes automated exposure control, adjustment of the mA and/or kV according to patient size and/or use of iterative reconstruction technique. CONTRAST:  OMNIPAQUE IOHEXOL 300 MG/ML  SOLN COMPARISON:  October 13, 2022 FINDINGS: Lower chest: There is a small right pleural effusion. This is mildly increased in size when compared to the prior exam. Hepatobiliary: No focal liver abnormality is seen. Status post cholecystectomy. No biliary dilatation. Pancreas: Unremarkable. No pancreatic ductal dilatation or surrounding inflammatory changes. Spleen: Normal in size without focal abnormality. Adrenals/Urinary Tract: Adrenal glands are unremarkable. Kidneys are normal, without renal calculi, focal lesion, or hydronephrosis. The urinary bladder is poorly distended and subsequently limited in evaluation. Stomach/Bowel: Stomach is within normal limits. The appendix is normal. Numerous dilated small bowel loops are seen throughout the abdomen and pelvis (maximum small bowel diameter of approximately 4.3 cm). A transition zone is seen at the level of the terminal ileum (axial CT images 32 through 34, CT series 4). Noninflamed diverticula are seen throughout the large bowel. Persistently distended mid to distal sigmoid colon and rectum is noted. Vascular/Lymphatic: Aortic atherosclerosis. No enlarged abdominal or pelvic lymph nodes. Reproductive: Uterus and bilateral adnexa are  unremarkable. Other: No abdominal wall hernia or abnormality. No abdominopelvic ascites. Musculoskeletal: Multilevel chronic and degenerative changes seen throughout the lumbar spine. IMPRESSION: 1. Findings consistent with a partial small bowel obstruction with a transition zone at the level of the terminal ileum. 2. Colonic diverticulosis. 3. Small right pleural effusion. 4. Aortic atherosclerosis. Aortic Atherosclerosis (ICD10-I70.0). Electronically Signed   By: Aram Candela M.D.   On: 10/31/2022 03:24   DG Chest 1 View  Result Date: 10/31/2022 CLINICAL DATA:  Coffee brown emesis and abdominal distension. EXAM: CHEST  1 VIEW COMPARISON:  October 13, 2022 FINDINGS: The heart size and mediastinal contours are within normal limits. There is moderate severity calcification of the aortic arch. Both lungs are clear. Degenerative changes are seen involving the right shoulder and throughout the thoracic spine. IMPRESSION: No active disease. Electronically Signed   By: Aram Candela M.D.   On: 10/31/2022 03:16     LOS: 1 day   Lanae Boast, MD [facility]  11/01/2022, 7:55 AM

## 2022-11-01 NOTE — Progress Notes (Signed)
Subjective: Abdomen remains distended. No reported bowel function but patient is a poor historian.  XR did not show any contrast in her GI tract and her SB remains dilated.    ROS: See above, otherwise other systems negative  Objective: Vital signs in last 24 hours: Temp:  [97.9 F (36.6 C)-98.6 F (37 C)] 97.9 F (36.6 C) (09/08 0422) Pulse Rate:  [79-85] 81 (09/08 0422) Resp:  [14-24] 14 (09/08 0422) BP: (171-198)/(78-103) 198/97 (09/08 0422) SpO2:  [92 %-100 %] 100 % (09/08 0422) Weight:  [86.1 kg] 86.1 kg (09/08 0500)    Intake/Output from previous day: 09/07 0701 - 09/08 0700 In: 994.8 [I.V.:994.8] Out: 200 [Emesis/NG output:200] Intake/Output this shift: No intake/output data recorded.  PE: Gen: elderly female, NAD Resp: no increased WOB CV: RRR Abd: soft, distended, no response to palpation, no peritoneal signs  Lab Results:  Recent Labs    10/31/22 0055 10/31/22 0647 10/31/22 1858 11/01/22 0435  WBC 7.6  --   --  6.9  HGB 11.1*   < > 9.3* 9.1*  HCT 35.1*   < > 29.6* 28.9*  PLT 301  --   --  255   < > = values in this interval not displayed.   BMET Recent Labs    10/31/22 0055 11/01/22 0435  NA 138 143  K 3.7 3.5  CL 97* 99  CO2 33* 30  GLUCOSE 125* 99  BUN 19 17  CREATININE 0.71 0.57  CALCIUM 9.2 9.5   PT/INR No results for input(s): "LABPROT", "INR" in the last 72 hours. CMP     Component Value Date/Time   NA 143 11/01/2022 0435   NA 138 06/03/2016 0000   K 3.5 11/01/2022 0435   CL 99 11/01/2022 0435   CO2 30 11/01/2022 0435   GLUCOSE 99 11/01/2022 0435   BUN 17 11/01/2022 0435   BUN 15 06/03/2016 0000   CREATININE 0.57 11/01/2022 0435   CALCIUM 9.5 11/01/2022 0435   PROT 6.5 11/01/2022 0435   ALBUMIN 3.1 (L) 11/01/2022 0435   AST 42 (H) 11/01/2022 0435   ALT 41 11/01/2022 0435   ALKPHOS 57 11/01/2022 0435   BILITOT 0.6 11/01/2022 0435   GFRNONAA >60 11/01/2022 0435   GFRAA >60 10/31/2014 0515   Lipase      Component Value Date/Time   LIPASE 32 09/15/2022 1600    Studies/Results: DG Abd Portable 1V-Small Bowel Obstruction Protocol-initial, 8 hr delay  Result Date: 11/01/2022 CLINICAL DATA:  8 hour delay small-bowel obstruction protocol. EXAM: PORTABLE ABDOMEN - 1 VIEW COMPARISON:  CT abdomen and pelvis 10/31/2022 FINDINGS: No significant oral contrast identified within small bowel or colon. Diffusely dilated small bowel loops are present measuring up to 4 cm. Contrast is seen in the bladder. IMPRESSION: 1. Diffusely dilated small bowel again seen. No significant oral contrast identified in small bowel or colon. Findings are concerning for severe ileus or small-bowel obstruction. Electronically Signed   By: Darliss Cheney M.D.   On: 11/01/2022 00:17   DG Abd Portable 1 View  Result Date: 10/31/2022 CLINICAL DATA:  89 year old female status post nasogastric tube placement. EXAM: PORTABLE ABDOMEN - 1 VIEW COMPARISON:  Abdominal radiograph 10/13/2022. FINDINGS: Nasogastric tube in position with tip in the mid body of the stomach and side port just distal to the gastroesophageal junction. Visualized portions of the upper abdomen again demonstrate numerous gas-filled loops of bowel, some of which appear dilated (poorly evaluated on this single view examination  which excludes the majority of the lower 2/3 of the abdomen). IMPRESSION: 1. Tip of nasogastric tube is in the body of the stomach with side port just distal to the gastroesophageal junction. 2. Persistent bowel dilatation, likely reflective of small bowel obstruction as demonstrated on contemporaneously obtained CT of the abdomen and pelvis. Electronically Signed   By: Trudie Reed M.D.   On: 10/31/2022 06:06   CT ABDOMEN PELVIS W CONTRAST  Result Date: 10/31/2022 CLINICAL DATA:  Cough for ground emesis and abdominal distension. EXAM: CT ABDOMEN AND PELVIS WITH CONTRAST TECHNIQUE: Multidetector CT imaging of the abdomen and pelvis was performed using  the standard protocol following bolus administration of intravenous contrast. RADIATION DOSE REDUCTION: This exam was performed according to the departmental dose-optimization program which includes automated exposure control, adjustment of the mA and/or kV according to patient size and/or use of iterative reconstruction technique. CONTRAST:  OMNIPAQUE IOHEXOL 300 MG/ML  SOLN COMPARISON:  October 13, 2022 FINDINGS: Lower chest: There is a small right pleural effusion. This is mildly increased in size when compared to the prior exam. Hepatobiliary: No focal liver abnormality is seen. Status post cholecystectomy. No biliary dilatation. Pancreas: Unremarkable. No pancreatic ductal dilatation or surrounding inflammatory changes. Spleen: Normal in size without focal abnormality. Adrenals/Urinary Tract: Adrenal glands are unremarkable. Kidneys are normal, without renal calculi, focal lesion, or hydronephrosis. The urinary bladder is poorly distended and subsequently limited in evaluation. Stomach/Bowel: Stomach is within normal limits. The appendix is normal. Numerous dilated small bowel loops are seen throughout the abdomen and pelvis (maximum small bowel diameter of approximately 4.3 cm). A transition zone is seen at the level of the terminal ileum (axial CT images 32 through 34, CT series 4). Noninflamed diverticula are seen throughout the large bowel. Persistently distended mid to distal sigmoid colon and rectum is noted. Vascular/Lymphatic: Aortic atherosclerosis. No enlarged abdominal or pelvic lymph nodes. Reproductive: Uterus and bilateral adnexa are unremarkable. Other: No abdominal wall hernia or abnormality. No abdominopelvic ascites. Musculoskeletal: Multilevel chronic and degenerative changes seen throughout the lumbar spine. IMPRESSION: 1. Findings consistent with a partial small bowel obstruction with a transition zone at the level of the terminal ileum. 2. Colonic diverticulosis. 3. Small right  pleural effusion. 4. Aortic atherosclerosis. Aortic Atherosclerosis (ICD10-I70.0). Electronically Signed   By: Aram Candela M.D.   On: 10/31/2022 03:24   DG Chest 1 View  Result Date: 10/31/2022 CLINICAL DATA:  Coffee brown emesis and abdominal distension. EXAM: CHEST  1 VIEW COMPARISON:  October 13, 2022 FINDINGS: The heart size and mediastinal contours are within normal limits. There is moderate severity calcification of the aortic arch. Both lungs are clear. Degenerative changes are seen involving the right shoulder and throughout the thoracic spine. IMPRESSION: No active disease. Electronically Signed   By: Aram Candela M.D.   On: 10/31/2022 03:16    Anti-infectives: Anti-infectives (From admission, onward)    None       Assessment/Plan 89 y/o F w/ multiple comorbidity and hx of cholecystectomy who presents with coffee ground emesis and CT evidence of SBO  - Wound recommend GI consult to eval GIB - Will order repeat gastrografin today.  It is unclear if the contrast was evacuated by the NG prior to the exam - No indication for urgent surgical intervention. Surgery will continue to follow  I reviewed last 24 h vitals and pain scores, last 48 h intake and output, last 24 h labs and trends, and last 24 h imaging results.  This  care required moderate level of medical decision making.    LOS: 1 day   T[facility] 11/01/2022, 8:39 AM Please see Amion for pager number during day hours 7:00am-4:30pm or 7:00am -11:30am on weekends

## 2022-11-01 NOTE — Progress Notes (Signed)
Civil engineer, contracting Hospitalized Hospice Patient   [name] is a current hospice followed at [facility] who was admitted to [facility] on 9.7.24 with primary diagnosis of small bowel obstruction.  Per Dr. Gordy Savers, hospice physician, this is a related hospital admission.  Visited with patient at bedside. Patient pleasantly confused with mits in place showing no signs of pain discomfort. MSW spoke with daughter/Leslie Farley and at this time, plan is to continue current care regimen and 'treat the treatable.' Daughter confirms that she would like for patient to discharge with continued hospice support once medically clear.   Vital Signs:      11/01/22 1409 11/01/22 1530  BP: (!) 190/100 (!) 191/102  Pulse: 79   Resp: 20   Temp: 98.6 F (37 C)   SpO2: 98%     Intake/Output Summary (Last 24 hours) at 11/01/2022 1717 Last data filed at 11/01/2022 1409 Gross per 24 hour  Intake 994.78 ml  Output 200 ml  Net 794.78 ml     Abnormal Labs:   Latest Reference Range & Units 11/01/22 04:35  Albumin 3.5 - 5.0 g/dL 3.1 (L)  AST 15 - 41 U/L 42 (H)  RBC 3.87 - 5.11 MIL/uL 2.92 (L)  Hemoglobin 12.0 - 15.0 g/dL 9.1 (L)  HCT 65.7 - 84.6 % 28.9 (L)   Diagnostics: CT ABDOMEN PELVIS W CONTRAST   Result Date: 10/31/2022 CLINICAL DATA:  Cough for ground emesis and abdominal distension. EXAM: CT ABDOMEN AND PELVIS WITH CONTRAST TECHNIQUE: Multidetector CT imaging of the abdomen and pelvis was performed using the standard protocol following bolus administration of intravenous contrast. RADIATION DOSE REDUCTION: This exam was performed according to the departmental dose-optimization program which includes automated exposure control, adjustment of the mA and/or kV according to patient size and/or use of iterative reconstruction technique. CONTRAST:  OMNIPAQUE IOHEXOL 300 MG/ML  SOLN COMPARISON:  October 13, 2022 FINDINGS: Lower chest: There is a small right pleural effusion. This is  mildly increased in size when compared to the prior exam. Hepatobiliary: No focal liver abnormality is seen. Status post cholecystectomy. No biliary dilatation. Pancreas: Unremarkable. No pancreatic ductal dilatation or surrounding inflammatory changes. Spleen: Normal in size without focal abnormality. Adrenals/Urinary Tract: Adrenal glands are unremarkable. Kidneys are normal, without renal calculi, focal lesion, or hydronephrosis. The urinary bladder is poorly distended and subsequently limited in evaluation. Stomach/Bowel: Stomach is within normal limits. The appendix is normal. Numerous dilated small bowel loops are seen throughout the abdomen and pelvis (maximum small bowel diameter of approximately 4.3 cm). A transition zone is seen at the level of the terminal ileum (axial CT images 32 through 34, CT series 4). Noninflamed diverticula are seen throughout the large bowel. Persistently distended mid to distal sigmoid colon and rectum is noted. Vascular/Lymphatic: Aortic atherosclerosis. No enlarged abdominal or pelvic lymph nodes. Reproductive: Uterus and bilateral adnexa are unremarkable. Other: No abdominal wall hernia or abnormality. No abdominopelvic ascites. Musculoskeletal: Multilevel chronic and degenerative changes seen throughout the lumbar spine. IMPRESSION: 1. Findings consistent with a partial small bowel obstruction with a transition zone at the level of the terminal ileum. 2. Colonic diverticulosis. 3. Small right pleural effusion. 4. Aortic atherosclerosis. Aortic Atherosclerosis (ICD10-I70.0). Electronically Signed   By: Aram Candela M.D.   On: 10/31/2022 03:24    DG Chest 1 View   Result Date: 10/31/2022 CLINICAL DATA:  Coffee brown emesis and abdominal distension. EXAM: CHEST  1 VIEW COMPARISON:  October 13, 2022 FINDINGS: The heart size and  mediastinal contours are within normal limits. There is moderate severity calcification of the aortic arch. Both lungs are clear. Degenerative  changes are seen involving the right shoulder and throughout the thoracic spine. IMPRESSION: No active disease. Electronically Signed   By: Aram Candela M.D.   On: 10/31/2022 03:16     IV medications: Protonix 40mg  IV BID Hydralazine 5mg  IV q6prn- last dose today @ 0402 LR @ 115ml/hour  albuterol, 2 puff, Q6H PRN hydrALAZINE, 5-10 mg, Q6H PRN x1 ipratropium-albuterol, 3 mL, Q2H PRN ipratropium-albuterol, 3 mL, Q6H PRN nitroGLYCERIN, 0.4 mg, Q5 min PRN   HOSPITAL PROBLEM Partial small bowel obstruction status post NG tube placement to suction CT abdomen and pelvis with contrast revealed findings suggestive of partial small bowel obstruction with a transition zone at the level of the terminal ileum, small right pleural effusion.  The patient's daughter at bedside wants NG tube placed.  NG tube was placed in the ED to suction.  Large amount of fluid removed from severely distended abdomen. IV fluid hydration Replete electrolytes as indicated, optimize magnesium and potassium levels Per the patient's daughter at bedside the patient is nonambulatory.   Reported ground coffee emesis with positive FOBT Add IV Protonix 40 mg twice daily Serial H&H every 6 hours Transfuse hemoglobin less than 8.0 Maintain MAP greater than 65   Dementia, recently enrolled in hospice care Palliative care medicine consulted. DNR/DNI   Chronic diastolic CHF Closely monitor volume status while on IV fluid No evidence of pulmonary edema on chest x-ray, O2 saturation 97% on room air Start strict I's and O's and daily weight   Hypertension BP is not at goal, elevated IV hydralazine as needed with parameters Closely monitor vital signs   Ambulatory dysfunction Per her daughter at bedside she is nonambulatory Fall precautions   Discharge Planning:   Ongoing  Family contact:   Refer to above  IDT:  Updated    Goals of Care:  Clear. Continue current POC.   Should patient need ambulance  transfer at discharge- please use [facility]) as they contract this service for our active hospice patients.  Eugenie Birks, MSW [facility]

## 2022-11-02 ENCOUNTER — Inpatient Hospital Stay (HOSPITAL_COMMUNITY)

## 2022-11-02 DIAGNOSIS — K56600 Partial intestinal obstruction, unspecified as to cause: Secondary | ICD-10-CM | POA: Diagnosis not present

## 2022-11-02 LAB — BASIC METABOLIC PANEL
Anion gap: 11 (ref 5–15)
BUN: 17 mg/dL (ref 8–23)
CO2: 29 mmol/L (ref 22–32)
Calcium: 8.8 mg/dL — ABNORMAL LOW (ref 8.9–10.3)
Chloride: 99 mmol/L (ref 98–111)
Creatinine, Ser: 0.59 mg/dL (ref 0.44–1.00)
GFR, Estimated: >60 mL/min (ref >60)
Glucose, Bld: 80 mg/dL (ref 70–99)
Potassium: 3 mmol/L — ABNORMAL LOW (ref 3.5–5.1)
Sodium: 139 mmol/L (ref 135–145)

## 2022-11-02 LAB — CBC
HCT: 27.4 % — ABNORMAL LOW (ref 36.0–46.0)
Hemoglobin: 8.6 g/dL — ABNORMAL LOW (ref 12.0–15.0)
MCH: 30.6 pg (ref 26.0–34.0)
MCHC: 31.4 g/dL (ref 30.0–36.0)
MCV: 97.5 fL (ref 80.0–100.0)
Platelets: 251 10*3/uL (ref 150–400)
RBC: 2.81 MIL/uL — ABNORMAL LOW (ref 3.87–5.11)
RDW: 14.5 % (ref 11.5–15.5)
WBC: 7.2 10*3/uL (ref 4.0–10.5)
nRBC: 0 % (ref 0.0–0.2)

## 2022-11-02 MED ORDER — POTASSIUM CHLORIDE IN NACL 20-0.9 MEQ/L-% IV SOLN
INTRAVENOUS | Status: DC
  Filled 2022-11-02 (×6): qty 1000

## 2022-11-02 MED ORDER — POTASSIUM CHLORIDE 10 MEQ/100ML IV SOLN
10.0000 meq | INTRAVENOUS | Status: DC
  Administered 2022-11-02: 10 meq via INTRAVENOUS
  Filled 2022-11-02: qty 100

## 2022-11-02 MED ORDER — LACTATED RINGERS IV SOLN: INTRAVENOUS | Status: DC

## 2022-11-02 MED ORDER — GERHARDT'S BUTT CREAM
TOPICAL_CREAM | Freq: Two times a day (BID) | CUTANEOUS | Status: DC
  Administered 2022-11-02 – 2022-11-12 (×5): 1 via TOPICAL
  Filled 2022-11-02: qty 1

## 2022-11-02 MED ORDER — BISACODYL 10 MG RE SUPP
10.0000 mg | Freq: Two times a day (BID) | RECTAL | Status: DC
  Administered 2022-11-02 (×2): 10 mg via RECTAL
  Filled 2022-11-02 (×2): qty 1

## 2022-11-02 MED ORDER — POTASSIUM CHLORIDE 10 MEQ/100ML IV SOLN
10.0000 meq | INTRAVENOUS | Status: AC
  Administered 2022-11-02 (×5): 10 meq via INTRAVENOUS
  Filled 2022-11-02: qty 100

## 2022-11-02 MED ORDER — LABETALOL HCL 5 MG/ML IV SOLN
5.0000 mg | INTRAVENOUS | Status: DC | PRN
  Administered 2022-11-02: 5 mg via INTRAVENOUS
  Filled 2022-11-02 (×2): qty 4

## 2022-11-02 MED ORDER — CLONIDINE HCL 0.1 MG/24HR TD PTWK
0.1000 mg | MEDICATED_PATCH | TRANSDERMAL | Status: DC
  Administered 2022-11-02 – 2022-11-09 (×2): 0.1 mg via TRANSDERMAL
  Filled 2022-11-02 (×2): qty 1

## 2022-11-02 NOTE — Progress Notes (Addendum)
Daily Progress Note   Patient Name: Leslie Farley       Date: 11/02/2022 DOB: [birth year]  Age: 89 y.o. MRN#: [id number] Attending Physician: Lanae Boast, MD Primary Care Physician: Pcp, No Admit Date: 10/30/2022  Reason for Consultation/Follow-up: Establishing goals of care  Subjective: In no distress, moans and talks to herself, wearing mittens, no acute distress noted.   Length of Stay: 2  Current Medications: Scheduled Meds:   bisacodyl  10 mg Rectal BID   cloNIDine  0.1 mg Transdermal Weekly   cycloSPORINE  1 drop Both Eyes BID   Gerhardt's butt cream   Topical BID   mometasone-formoterol  2 puff Inhalation BID   pantoprazole (PROTONIX) IV  40 mg Intravenous BID   polyvinyl alcohol  1 drop Both Eyes BID    Continuous Infusions:  0.9 % NaCl with KCl 20 mEq / L 100 mL/hr at 11/02/22 0846   potassium chloride 10 mEq (11/02/22 1203)    PRN Meds: albuterol, hydrALAZINE, ipratropium-albuterol, ipratropium-albuterol, labetalol, nitroGLYCERIN  Physical Exam         Elderly lady No distress Regular work of breathing Resting in bed with eyes closed, mumbles to herself Wearing mittens Abdomen is soft and distended  Vital Signs: BP (!) 195/87 (BP Location: Left Arm)   Pulse 85   Temp 98.5 F (36.9 C) (Oral)   Resp 18   Ht 5\' 5"  (1.651 m)   Wt 85.9 kg   SpO2 98%   BMI 31.51 kg/m  SpO2: SpO2: 98 % O2 Device: O2 Device: Room Air O2 Flow Rate:    Intake/output summary:  Intake/Output Summary (Last 24 hours) at 11/02/2022 1301 Last data filed at 11/02/2022 0936 Gross per 24 hour  Intake 1960.28 ml  Output 200 ml  Net 1760.28 ml   LBM:   Baseline Weight: Weight: 86.1 kg Most recent weight: Weight: 85.9 kg       Palliative Assessment/Data:      Patient Active  Problem List   Diagnosis Date Noted   Partial small bowel obstruction (HCC) 10/31/2022   Acute metabolic encephalopathy 10/11/2022   Urinary tract infection 10/11/2022   Hypokalemia 10/11/2022   Normocytic anemia 10/11/2022   Dementia (HCC) 09/22/2022   Coordination of complex care 09/22/2022   Palliative care encounter 09/20/2022   Goals of care, counseling/discussion 09/20/2022  Abdominal pain 09/16/2022   Leukocytosis 09/16/2022   AKI (acute kidney injury) (HCC) 09/16/2022   Elevated transaminase level 09/16/2022   Ileus (HCC) 09/16/2022   Stercoral colitis 03/29/2022   Fecal impaction (HCC) 03/29/2022   Cognitive impairment 03/29/2022   Chronic bronchitis (HCC) 01/15/2016   Insomnia 01/15/2016   Constipation 01/15/2016   CAD (coronary artery disease) of artery bypass graft 01/15/2016   Benign paroxysmal positional vertigo 01/15/2016   History of pulmonary embolism 01/14/2016   Peripheral neuropathy 06/26/2015   Chronic constipation 05/10/2015   COPD (chronic obstructive pulmonary disease) (HCC) 03/01/2015   Acute encephalopathy 11/10/2014   Right thyroid nodule 11/02/2014   Hypertension    GERD (gastroesophageal reflux disease)    Arthritis    Essential hypertension     Palliative Care Assessment & Plan   Patient Profile:    Assessment:  89 year old lady who lives at [facility]-term facility, was enrolled with hospice services about a week ago.  Known to palliative service-seen by various providers in previous hospitalization. Past medical history of chronic diastolic CHF, hypertension, dementia, admitted with reports of coffee-ground emesis and severely distended abdomen. Remains admitted to hospital medicine service with general surgery colleagues following.    Recommendations/Plan: Hospice colleagues following Continue current mode of care Recommend continuation of hospice services at [facility] facility on discharge Surgery note reviewed, concern for  ileus/pseudoobstruction and suppositories to be attempted today to stimulate bowel function. Goals of care discussions undertaken with the patient's daughter [name] on 11-01-2022 at the time of initial consultation.  They are familiar with hospice philosophy of care.  Plan is not for comfort care-only, they do desire continuation of current mode of care aimed towards some degree of stabilization/recovery.  They do wish to continue hospice services in the outpatient setting at [facility]     Code Status:    Code Status Orders  (From admission, onward)           Start     Ordered   10/31/22 0349  Do not attempt resuscitation (DNR)- Limited -Do Not Intubate (DNI)  Continuous       Question Answer Comment  If pulseless and not breathing No CPR or chest compressions.   In Pre-Arrest Conditions (Patient Is Breathing and Has A Pulse) Do not intubate. Provide all appropriate non-invasive medical interventions. Avoid ICU transfer unless indicated or required.   Consent: Discussion documented in EHR or advanced directives reviewed      10/31/22 0348           Code Status History     Date Active Date Inactive Code Status Order ID Comments User Context   10/11/2022 1558 10/19/2022 1943 DNR [id number]  Clydie Braun, MD ED   10/11/2022 1548 10/11/2022 1558 DNR [id number]  Cathren Laine, MD ED   09/18/2022 1228 09/22/2022 2218 DNR [id number]  Rosalin Hawking, MD Inpatient   09/15/2022 2145 09/18/2022 1228 Full Code [id number]  John Giovanni, MD ED   03/29/2022 2053 04/01/2022 2008 Full Code [id number]  Sunnie Nielsen, DO ED   10/30/2014 2141 11/02/2014 2205 Partial Code [id number]  Lorretta Harp, MD Inpatient       Prognosis:  Unable to determine  Discharge Planning: Skilled Nursing Facility with Hospice  Care plan was discussed with  IDT  Thank you for allowing the Palliative Medicine Team to assist in the care of this patient. Low MDM.      Greater than 50%  of this time was spent counseling  and coordinating  care related to the above assessment and plan.  Rosalin Hawking, MD  Please contact Palliative Medicine Team phone at [phone] for questions and concerns.

## 2022-11-02 NOTE — Consult Note (Addendum)
WOC Nurse Consult Note: Reason for Consult: sacral wound  Wound type: Stage 2 Pressure Injury  Pressure Injury POA: Yes Measurement: 4 cm x 6 cm total area buttocks and sacrum, linear partial thickness skin loss noted coccyx  Wound bed: pink moist  Drainage (amount, consistency, odor) minimal serosanguinous from coccyx; buttocks appear dry  Periwound: intact, patient is noted to be incontinent of urine Dressing procedure/placement/frequency: Clean sacrum/buttocks/coccyx with soap and water and dry thoroughly. Apply Gerhardt's Butt Cream to area 2 times daily and prn soiling.  May cover with silicone foam or ABD pad whichever is preferred.   POC discussed with bedside nurse. WOC team will not follow. Re-consult if further needs arise.    Thank you    Priscella Mann MSN, RN-BC, [employer] [phone]

## 2022-11-02 NOTE — Progress Notes (Signed)
Subjective: Abdomen remains distended. No reported bowel function but patient is a poor historian.  NG was dislodged and had to be replaced overnight.  ROS: See above, otherwise other systems negative  Objective: Vital signs in last 24 hours: Temp:  [98.2 F (36.8 C)-98.6 F (37 C)] 98.5 F (36.9 C) (09/09 0519) Pulse Rate:  [79-85] 85 (09/09 0519) Resp:  [18-20] 18 (09/09 0519) BP: (178-195)/(87-103) 195/87 (09/09 0519) SpO2:  [92 %-100 %] 98 % (09/09 0519)    Intake/Output from previous day: 09/08 0701 - 09/09 0700 In: 1960.3 [I.V.:1960.3] Out: 200 [Emesis/NG output:200] Intake/Output this shift: No intake/output data recorded.  PE: Gen: elderly female, NAD Resp: no increased WOB CV: RRR Abd: soft, distended, no response to palpation, no peritoneal signs  Lab Results:  Recent Labs    11/01/22 0435 11/01/22 1839 11/02/22 0436  WBC 6.9  --  7.2  HGB 9.1* 9.7* 8.6*  HCT 28.9* 30.6* 27.4*  PLT 255  --  251   BMET Recent Labs    11/01/22 0435 11/02/22 0436  NA 143 139  K 3.5 3.0*  CL 99 99  CO2 30 29  GLUCOSE 99 80  BUN 17 17  CREATININE 0.57 0.59  CALCIUM 9.5 8.8*   PT/INR No results for input(s): "LABPROT", "INR" in the last 72 hours. CMP     Component Value Date/Time   NA 139 11/02/2022 0436   NA 138 06/03/2016 0000   K 3.0 (L) 11/02/2022 0436   CL 99 11/02/2022 0436   CO2 29 11/02/2022 0436   GLUCOSE 80 11/02/2022 0436   BUN 17 11/02/2022 0436   BUN 15 06/03/2016 0000   CREATININE 0.59 11/02/2022 0436   CALCIUM 8.8 (L) 11/02/2022 0436   PROT 6.5 11/01/2022 0435   ALBUMIN 3.1 (L) 11/01/2022 0435   AST 42 (H) 11/01/2022 0435   ALT 41 11/01/2022 0435   ALKPHOS 57 11/01/2022 0435   BILITOT 0.6 11/01/2022 0435   GFRNONAA >60 11/02/2022 0436   GFRAA >60 10/31/2014 0515   Lipase     Component Value Date/Time   LIPASE 32 09/15/2022 1600    Studies/Results: DG Abd Portable 1V  Result Date: 11/02/2022 CLINICAL DATA:   Nasogastric tube placement EXAM: PORTABLE ABDOMEN - 1 VIEW COMPARISON:  11/01/2022 at 6:58 p.m. FINDINGS: Tip and side port of the nasogastric tube project within the distal stomach. Diffuse gas distended colon and small bowel throughout the abdomen. IMPRESSION: Tip and side port of the nasogastric tube project within the distal stomach. Electronically Signed   By: Deatra Robinson M.D.   On: 11/02/2022 00:07   DG Abd Portable 1V-Small Bowel Obstruction Protocol-initial, 8 hr delay  Result Date: 11/01/2022 CLINICAL DATA:  Small-bowel obstruction.  8 hour delay. EXAM: PORTABLE ABDOMEN - 1 VIEW COMPARISON:  Abdominal radiograph dated 10/31/2022. FINDINGS: Enteric tube with tip and side port in the epigastric area in the region of the GE junction. Recommend further advancing for optimal positioning. There is diffuse dilatation of small bowel with probable air distention of the colon. Oral contrast noted in the proximal colon as well as within the rectum. IMPRESSION: 1. Enteric tube with tip and side port in the region of the GE junction. Recommend further advancing for optimal positioning. 2. Diffuse dilatation of small bowel with oral contrast in the proximal colon and rectum. Findings favored to represent an ileus rather than a small bowel obstruction. Clinical correlation is recommended. Electronically Signed   By: Burtis Junes  Radparvar M.D.   On: 11/01/2022 23:02   DG Abd Portable 1V-Small Bowel Obstruction Protocol-initial, 8 hr delay  Result Date: 11/01/2022 CLINICAL DATA:  8 hour delay small-bowel obstruction protocol. EXAM: PORTABLE ABDOMEN - 1 VIEW COMPARISON:  CT abdomen and pelvis 10/31/2022 FINDINGS: No significant oral contrast identified within small bowel or colon. Diffusely dilated small bowel loops are present measuring up to 4 cm. Contrast is seen in the bladder. IMPRESSION: 1. Diffusely dilated small bowel again seen. No significant oral contrast identified in small bowel or colon. Findings are  concerning for severe ileus or small-bowel obstruction. Electronically Signed   By: Darliss Cheney M.D.   On: 11/01/2022 00:17    Anti-infectives: Anti-infectives (From admission, onward)    None       Assessment/Plan 89 y/o F w/ multiple comorbidity and hx of cholecystectomy who presents with coffee ground emesis and CT evidence of ileus  - Wound recommend GI consult to eval GIB -On my review of her CT images from admission, she has dilation of small and large bowel with fairly significant dilation of the rectum, I think overall this is more concerning of a ileus/pseudoobstruction.  Will start suppositories to hopefully stimulate bowel function - No indication for urgent surgical intervention. Surgery will continue to follow -Hypokalemia-replace IV, I reviewed last 24 h vitals and pain scores, last 48 h intake and output, last 24 h labs and trends, and last 24 h imaging results.  This care required moderate level of medical decision making.    LOS: 2 days   Lady Deutscher [facility] 11/02/2022, 9:07 AM Please see Amion for pager number during day hours 7:00am-4:30pm or 7:00am -11:30am on weekends

## 2022-11-02 NOTE — Progress Notes (Signed)
PROGRESS NOTE Leslie Farley  NWG:[id number] DOB: [birth year] DOA: 10/30/2022 PCP: Pcp, No  Brief Narrative/Hospital Course: 89 y.o. female with medical history significant for chronic diastolic CHF, hypertension, advanced dementia recently enrolled in hospice care who presents from SNF due to reportedly vomiting coffee-ground emesis and severely distended abdomen. In the ED: Afebrile vitals stable not hypoxic, labs stable renal function mild anemia CT abdomen and pelvis with contrast revealed findings suggestive of partial small bowel obstruction with a transition zone at the level of the terminal ileum, small right pleural effusion.  The patient's daughter at bedside was also requesting NG tube placement, NGT was placed following which large amount of fluid removed, and subsequently admitted for further management.   Subjective: patient seen and examined this morning She is alert awake, able to tell me her name afebrile BP remains elevated 170s to 190s NGT output-200 cc Labs with hypokalemia hemoglobin further dropping    Assessment and Plan: Principal Problem:   Partial small bowel obstruction (HCC)  Partial small bowel obstruction-more likely pseudoobstruction-with a small and large bowel dilatation and significant dilatation of rectum: Continue with NGT decompression IVD. Npo, supportive care as per general surgery, followed SBFT protocol, follow-up x-ray per surgery, abdomen appears distended per surgery this is more likely pseudo-obstruction/ileus, so starting on Dulcolax to stimulate bowel function. Patient is nonambulatory and currently in hospice but family wishes to continue with time-limited intervention/procedure and trial to treat what is treatable.  Reported coffee-ground emesis FOBT positive stool Normocytic anemia: Possible acute blood loss anemia hemoglobin in the mid 8 g range previously on 10, monitor. Hb is further dropping continue to monitor H&H serially transfuse if  less than 7 g, continue PPI, Dr Loreta Ave from GI consulted  Recent Labs  Lab 10/31/22 1248 10/31/22 1858 11/01/22 0435 11/01/22 1839 11/02/22 0436  HGB 9.3* 9.3* 9.1* 9.7* 8.6*  HCT 29.2* 29.6* 28.9* 30.6* 27.4*   Hypokalemia: add KCl on IVF and additional KCl 3meqx4  Dementia: She is alert awake continue supportive care delirium precaution   Chronic diastolic CHF: On IV fluid hydration for #1 continue cautiously monitor daily weight and intake output  Net IO Since Admission: 1,894.91 mL [11/02/22 0933]  Filed Weights   11/01/22 0500  Weight: 86.1 kg    Ambulatory dysfunction: Currently bedbound  Hypertension: BP remains poorly controlled on hydralazine dose adjusted 5 to 10 mg prn ad labetaolo prn and clonidine patch.  PTA on amlodipine 5 mg-unable to use p.o. due to SBO.  Class I Obesity:Patient's Body mass index is 31.59 kg/m. : Will benefit with PCP follow-up, weight loss  healthy lifestyle  Goals of care: Patient is DNR currently in hospice services goal is to keep her comfortable, treat what is treatable Palliative care has been consulted  Pressure ulcer POA Pressure Injury 09/16/22 Coccyx Posterior;Mid Stage 1 -  Intact skin with non-blanchable redness of a localized area usually over a bony prominence. Ol wound healed, still not blachable (Active)  09/16/22 1240  Location: Coccyx  Location Orientation: Posterior;Mid  Staging: Stage 1 -  Intact skin with non-blanchable redness of a localized area usually over a bony prominence.  Wound Description (Comments): Ol wound healed, still not blachable  Present on Admission: Yes  Dressing Type Foam - Lift dressing to assess site every shift 10/31/22 2000   DVT prophylaxis: SCDs Start: 10/31/22 0349 Code Status:   Code Status: Limited: Do not attempt resuscitation (DNR) -DNR-LIMITED -Do Not Intubate/DNI  Family Communication: plan of care discussed with patient/RN at  bedside. Patient status is: Inpatient because of bowel  obstruction Level of care: Telemetry   Dispo: The patient is from: home            Anticipated disposition: TBD  Objective: Vitals last 24 hrs: Vitals:   11/01/22 1948 11/01/22 2104 11/02/22 0207 11/02/22 0519  BP: (!) 188/89  (!) 178/103 (!) 195/87  Pulse: 80  79 85  Resp: 18   18  Temp: 98.2 F (36.8 C)   98.5 F (36.9 C)  TempSrc: Oral   Oral  SpO2: 100% 98%  98%  Weight:       Weight change:   Physical Examination: General exam: alert awake, oriented x1-2, HEENT:Oral mucosa moist, Ear/Nose WNL grossly Respiratory system: Bilaterally clear BS,no use of accessory muscle Cardiovascular system: S1 & S2 +, No JVD. Gastrointestinal system: Abdomen firm with significant distention bowel sounds noted Nervous System: Alert, awake, moving all extremities,and following commands. Extremities: LE edema neg,distal peripheral pulses palpable and warm.  Skin: No rashes,no icterus. MSK: Normal muscle bulk,tone, power   Medications reviewed:  Scheduled Meds:  bisacodyl  10 mg Rectal BID   cloNIDine  0.1 mg Transdermal Weekly   cycloSPORINE  1 drop Both Eyes BID   mometasone-formoterol  2 puff Inhalation BID   pantoprazole (PROTONIX) IV  40 mg Intravenous BID   polyvinyl alcohol  1 drop Both Eyes BID   Continuous Infusions:  0.9 % NaCl with KCl 20 mEq / L 100 mL/hr at 11/02/22 0846   potassium chloride     Diet Order     None     Every morning Intake/Output Summary (Last 24 hours) at 11/02/2022 0933 Last data filed at 11/02/2022 9563 Gross per 24 hour  Intake 1960.28 ml  Output 200 ml  Net 1760.28 ml   Net IO Since Admission: 1,894.91 mL [11/02/22 0933]  Wt Readings from Last 3 Encounters:  11/01/22 86.1 kg  10/16/22 85.2 kg  09/15/22 82.4 kg     Unresulted Labs (From admission, onward)     Start     Ordered   11/03/22 0500  Magnesium  Tomorrow morning,   R        11/02/22 0909   11/02/22 0500  Basic metabolic panel  Daily,   R      11/01/22 1158   11/02/22 0500   CBC  Daily,   R      11/01/22 1158          Data Reviewed: I have personally reviewed following labs and imaging studies CBC: Recent Labs  Lab 10/31/22 0055 10/31/22 0647 10/31/22 1248 10/31/22 1858 11/01/22 0435 11/01/22 1839 11/02/22 0436  WBC 7.6  --   --   --  6.9  --  7.2  NEUTROABS 5.8  --   --   --   --   --   --   HGB 11.1*   < > 9.3* 9.3* 9.1* 9.7* 8.6*  HCT 35.1*   < > 29.2* 29.6* 28.9* 30.6* 27.4*  MCV 97.5  --   --   --  99.0  --  97.5  PLT 301  --   --   --  255  --  251   < > = values in this interval not displayed.   Basic Metabolic Panel: Recent Labs  Lab 10/31/22 0055 11/01/22 0435 11/02/22 0436  NA 138 143 139  K 3.7 3.5 3.0*  CL 97* 99 99  CO2 33* 30 29  GLUCOSE 125*  99 80  BUN 19 17 17   CREATININE 0.71 0.57 0.59  CALCIUM 9.2 9.5 8.8*  MG  --  1.9  --   PHOS  --  2.7  --    GFR: Estimated Creatinine Clearance: 51.6 mL/min (by C-G formula based on SCr of 0.59 mg/dL). Liver Function Tests: Recent Labs  Lab 11/01/22 0435  AST 42*  ALT 41  ALKPHOS 57  BILITOT 0.6  PROT 6.5  ALBUMIN 3.1*  Antimicrobials: Anti-infectives (From admission, onward)    None      Culture/Microbiology    Component Value Date/Time   SDES URINE, CLEAN CATCH 10/11/2022 1345   SPECREQUEST  10/11/2022 1345    NONE Performed at [facility], [address], [place], [place] [address]    CULT >=100,000 COLONIES/mL KLEBSIELLA PNEUMONIAE (A) 10/11/2022 1345   REPTSTATUS 10/13/2022 FINAL 10/11/2022 1345  Radiology Studies: DG Abd Portable 1V  Result Date: 11/02/2022 CLINICAL DATA:  Nasogastric tube placement EXAM: PORTABLE ABDOMEN - 1 VIEW COMPARISON:  11/01/2022 at 6:58 p.m. FINDINGS: Tip and side port of the nasogastric tube project within the distal stomach. Diffuse gas distended colon and small bowel throughout the abdomen. IMPRESSION: Tip and side port of the nasogastric tube project within the distal stomach. Electronically Signed   By: Deatra Robinson  M.D.   On: 11/02/2022 00:07   DG Abd Portable 1V-Small Bowel Obstruction Protocol-initial, 8 hr delay  Result Date: 11/01/2022 CLINICAL DATA:  Small-bowel obstruction.  8 hour delay. EXAM: PORTABLE ABDOMEN - 1 VIEW COMPARISON:  Abdominal radiograph dated 10/31/2022. FINDINGS: Enteric tube with tip and side port in the epigastric area in the region of the GE junction. Recommend further advancing for optimal positioning. There is diffuse dilatation of small bowel with probable air distention of the colon. Oral contrast noted in the proximal colon as well as within the rectum. IMPRESSION: 1. Enteric tube with tip and side port in the region of the GE junction. Recommend further advancing for optimal positioning. 2. Diffuse dilatation of small bowel with oral contrast in the proximal colon and rectum. Findings favored to represent an ileus rather than a small bowel obstruction. Clinical correlation is recommended. Electronically Signed   By: Elgie Collard M.D.   On: 11/01/2022 23:02   DG Abd Portable 1V-Small Bowel Obstruction Protocol-initial, 8 hr delay  Result Date: 11/01/2022 CLINICAL DATA:  8 hour delay small-bowel obstruction protocol. EXAM: PORTABLE ABDOMEN - 1 VIEW COMPARISON:  CT abdomen and pelvis 10/31/2022 FINDINGS: No significant oral contrast identified within small bowel or colon. Diffusely dilated small bowel loops are present measuring up to 4 cm. Contrast is seen in the bladder. IMPRESSION: 1. Diffusely dilated small bowel again seen. No significant oral contrast identified in small bowel or colon. Findings are concerning for severe ileus or small-bowel obstruction. Electronically Signed   By: Darliss Cheney M.D.   On: 11/01/2022 00:17     LOS: 2 days   Lanae Boast, MD [facility]  11/02/2022, 9:33 AM

## 2022-11-02 NOTE — Progress Notes (Signed)
[facility] hospitalized hospice patient visit.  [name] is a current hospice patient, start of care 8.29.24 with hospice diagnosos of COPD, followed at [facility] who was admitted to [facility] on 9.7.24 with primary diagnosis of small bowel obstruction. Per Dr. Gordy Savers, hospice physician, this is a related hospital admission.  Patient resting comfortably without signs of distress when visited. IV fluids and NGT to suction continued. Discussed care with patient's nurse. Reports during night patient dislodged NGT that was repositioned. Patient currently has mits on to protect NGT. Spoke with [name], patient's daughter by phone who is in agreement with plan for patient to discharge back to [facility] when medically ready. She is in agreement with current treatment but would not want more aggressive interventions.  Patient remains GIP appropriate due to need for IV fluids, and Nasal Gastric Tube. Vital Signs: 98.5/85/18    195/87     98%O2 on RA I&O: Abnormal labs: KCL 3.0, Calcium 8.8, HBG 8.6 Diagnostics: none new IV/PRN Meds: Protonix 40mg  IV BID, NS with KCL at 100 mg, Potassium 10 meq IV every hour x 6, hydralazine 5-10mg  IV every 6 hours as needed for BP, labetalol 5mg  every 4 hours as needed for BP Problem List: Partial small bowel obstruction-more likely pseudoobstruction-with a small and large bowel dilatation and significant dilatation of rectum: Continue with NGT decompression IVD. Npo, supportive care as per general surgery, followed SBFT protocol, follow-up x-ray per surgery, abdomen appears distended per surgery this is more likely pseudo-obstruction/ileus, so starting on Dulcolax to stimulate bowel function. Patient is nonambulatory and currently in hospice but family wishes to continue with time-limited intervention/procedure and trial to treat what is treatable.   Reported coffee-ground emesis FOBT positive stool Normocytic  anemia: Possible acute blood loss anemia hemoglobin in the mid 8 g range previously on 10, monitor. Hb is further dropping continue to monitor H&H serially transfuse if less than 7 g, continue PPI, Dr Loreta Ave from GI consulted    Hypokalemia: add KCl on IVF and additional KCl 71meqx4   Dementia: She is alert awake continue supportive care delirium precaution    Chronic diastolic CHF: On IV fluid hydration for #1 continue cautiously monitor daily weight and intake output  Net IO Since Admission: 1,894.91 mL [11/02/22 0933]   Discharge Planning: Ongoing, plan to return to [facility] upon Discharge with Hospice Services Family Contact: Spoke with daughter by phone IDT: Updated Goals of Care: DNR Should patient need ambulance transfer at discharge- please use [facility]) as they contract this service for our active hospice patients. Glenna Fellows BSN, RN, [facility] hospital liaison [phone]

## 2022-11-02 NOTE — TOC Initial Note (Addendum)
Transition of Care [facility]) - Initial/Assessment Note   Patient Details  Name: Leslie Farley MRN: [id number] Date of Birth: 04/14/1930  [facility] CM/SW Contact:    Ewing Schlein, LCSW Phone Number: 11/02/2022, 9:31 AM  Clinical Narrative: Patient is a long-term care resident of [facility] and is currently active with [facility] for hospice services. FL2 started. Anticipating discharge back to facility to resume hospice when medically ready. CSW followed up with patient's daughter, Leslie Farley, regarding anticipating patient returning to SNF under hospice. TOC to follow.  Expected Discharge Plan: Long Term Nursing Home ([facility] with [facility] hospice.) Barriers to Discharge: Continued Medical Work up  Patient Goals and CMS Choice Patient states their goals for this hospitalization and ongoing recovery are:: Resume hospices services at [email or web address] Compare Post Acute Care list provided to:: Patient Represenative (must comment) [name] (daughter)) Choice offered to / list presented to : Adult Children  Expected Discharge Plan and Services In-house Referral: Clinical Social Work Post Acute Care Choice: Nursing Home, Hospice Living arrangements for the past 2 months: Skilled Nursing Facility           DME Arranged: N/A DME Agency: NA  Prior Living Arrangements/Services Living arrangements for the past 2 months: Skilled Nursing Facility Lives with:: Facility Resident Patient language and need for interpreter reviewed:: Yes Do you feel safe going back to the place where you live?: Yes      Need for Family Participation in Patient Care: Yes (Comment) (Patient is oriented to self only.) Care giver support system in place?: Yes (comment) Criminal Activity/Legal Involvement Pertinent to Current Situation/Hospitalization: No - Comment as needed  Activities of Daily Living Home Assistive Devices/Equipment: Other (Comment) (Patient lives in a SNF  with hospice care) ADL Screening (condition at time of admission) Patient's cognitive ability adequate to safely complete daily activities?: No Is the patient deaf or have difficulty hearing?: Yes Does the patient have difficulty seeing, even when wearing glasses/contacts?: No Does the patient have difficulty concentrating, remembering, or making decisions?: Yes Patient able to express need for assistance with ADLs?: No Does the patient have difficulty dressing or bathing?: Yes Independently performs ADLs?: No Communication: Dependent Is this a change from baseline?: Pre-admission baseline Dressing (OT): Dependent Is this a change from baseline?: Pre-admission baseline Grooming: Dependent Is this a change from baseline?: Pre-admission baseline Feeding: Dependent Is this a change from baseline?: Pre-admission baseline Bathing: Dependent Is this a change from baseline?: Pre-admission baseline Toileting: Dependent Is this a change from baseline?: Pre-admission baseline In/Out Bed: Dependent Is this a change from baseline?: Pre-admission baseline Walks in Home: Dependent Is this a change from baseline?: Pre-admission baseline Does the patient have difficulty walking or climbing stairs?: Yes Weakness of Legs: Both Weakness of Arms/Hands: Both  Emotional Assessment Attitude/Demeanor/Rapport: Unable to Assess Affect (typically observed): Unable to Assess Orientation: : Oriented to Self Alcohol / Substance Use: Not Applicable Psych Involvement: No (comment)  Admission diagnosis:  SBO (small bowel obstruction) (HCC) [K56.609] Partial small bowel obstruction (HCC) [K56.600] Hypertensive urgency [I16.0] Patient Active Problem List   Diagnosis Date Noted   Partial small bowel obstruction (HCC) 10/31/2022   Acute metabolic encephalopathy 10/11/2022   Urinary tract infection 10/11/2022   Hypokalemia 10/11/2022   Normocytic anemia 10/11/2022   Dementia (HCC) 09/22/2022   Coordination  of complex care 09/22/2022   Palliative care encounter 09/20/2022   Goals of care, counseling/discussion 09/20/2022   Abdominal pain 09/16/2022   Leukocytosis 09/16/2022   AKI (  acute kidney injury) (HCC) 09/16/2022   Elevated transaminase level 09/16/2022   Ileus (HCC) 09/16/2022   Stercoral colitis 03/29/2022   Fecal impaction (HCC) 03/29/2022   Cognitive impairment 03/29/2022   Chronic bronchitis (HCC) 01/15/2016   Insomnia 01/15/2016   Constipation 01/15/2016   CAD (coronary artery disease) of artery bypass graft 01/15/2016   Benign paroxysmal positional vertigo 01/15/2016   History of pulmonary embolism 01/14/2016   Peripheral neuropathy 06/26/2015   Chronic constipation 05/10/2015   COPD (chronic obstructive pulmonary disease) (HCC) 03/01/2015   Acute encephalopathy 11/10/2014   Right thyroid nodule 11/02/2014   Hypertension    GERD (gastroesophageal reflux disease)    Arthritis    Essential hypertension    PCP:  Pcp, No Pharmacy:   [facility] - [place], [place] - [address] [place] [address] Phone: [phone] Fax: [phone]  Social Determinants of Health (SDOH) Social History: SDOH Screenings   Food Insecurity: Patient Unable To Answer (11/02/2022)  Housing: Patient Unable To Answer (11/02/2022)  Transportation Needs: Patient Unable To Answer (11/02/2022)  Utilities: Patient Unable To Answer (11/02/2022)  Tobacco Use: Medium Risk (10/11/2022)   SDOH Interventions:    Readmission Risk Interventions    11/02/2022    9:28 AM 03/31/2022    1:08 PM  Readmission Risk Prevention Plan  Post Dischage Appt  Complete  Medication Screening  Complete  Transportation Screening Complete Complete  Medication Review Oceanographer) Complete   HRI or Home Care Consult Complete   SW Recovery Care/Counseling Consult Complete   Palliative Care Screening Complete   Skilled Nursing Facility Complete

## 2022-11-02 NOTE — Consult Note (Signed)
UNASSIGNED PATIENT Reason for Consult: Coffee-ground emesis FOBT positive with partial small bowel obstruction. Referring Physician: [facility] hospitalist  Leslie Farley is an 89 y.o. female.  HPI: [name] is a 89 year old black female with multiple medical problems including hypertension , chronic CHF advanced dementia and hospice care, who presented to the hospital with coffee-ground emesis and severe distended abdomen CT scan of the abdomen pelvis revealed evidence of small bowel obstruction with transition zone in the terminal ileum and a small right pleural effusion.  GI consultation was requested   Past Medical History:  Diagnosis Date   Arthritis    Asthma    COPD (chronic obstructive pulmonary disease) (HCC)    GERD (gastroesophageal reflux disease)    Hypertension    Past Surgical History:  Procedure Laterality Date   CHOLECYSTECTOMY     Family History  Problem Relation Age of Onset   Cancer - Other Mother        Died of throat cancer   Cancer - Prostate Father    Diabetes Brother    Social History:  reports that she quit smoking about 51 years ago. She has never used smokeless tobacco. She reports that she does not drink alcohol and does not use drugs.  Allergies:  Allergies  Allergen Reactions   Cucumber Extract Other (See Comments)    "Allergic," per MAR    Medications: I have reviewed the patient's current medications. Prior to Admission:  Medications Prior to Admission  Medication Sig Dispense Refill Last Dose   acetaminophen (TYLENOL) 500 MG tablet Take 2 tablets (1,000 mg total) by mouth every 6 (six) hours as needed for mild pain or moderate pain. 30 tablet 0 unk   ADVAIR DISKUS 100-50 MCG/ACT AEPB Inhale 1 puff into the lungs See admin instructions. Inhale 1 puff into the lungs at 8 AM and 8 PM   10/30/2022   albuterol (VENTOLIN HFA) 108 (90 Base) MCG/ACT inhaler Inhale 2 puffs into the lungs every 6 (six) hours as needed for wheezing or shortness  of breath.   unk   Amino Acids-Protein Hydrolys (FEEDING SUPPLEMENT, PRO-STAT SUGAR FREE 64,) LIQD Take 30 mLs by mouth in the morning and at bedtime.   10/30/2022   amLODipine (NORVASC) 5 MG tablet Take 1 tablet (5 mg total) by mouth daily. 30 tablet 0 10/30/2022   bisacodyl (DULCOLAX) 10 MG suppository Place 1 suppository (10 mg total) rectally daily as needed for moderate constipation. 12 suppository 0 unk   Cholecalciferol (VITAMIN D) 50 MCG (2000 UT) tablet Take 2,000 Units by mouth in the morning.   10/30/2022   cycloSPORINE (RESTASIS) 0.05 % ophthalmic emulsion Place 1 drop into both eyes 2 (two) times daily.   10/30/2022   famotidine (PEPCID) 20 MG tablet Take 1 tablet (20 mg total) by mouth daily. (Patient taking differently: Take 20 mg by mouth in the morning.)   10/30/2022   ipratropium-albuterol (DUONEB) 0.5-2.5 (3) MG/3ML SOLN Take 3 mLs by nebulization See admin instructions. Nebulize the contents of one vial (3 ml's) and inhale into the lungs at 8 AM and 8 PM and every 6 hours as needed for shortness of breath or wheezing   10/30/2022   magnesium hydroxide (MILK OF MAGNESIA) 400 MG/5ML suspension Take 30 mLs by mouth daily as needed for mild constipation.   unk   melatonin 3 MG TABS tablet Take 6 mg by mouth at bedtime.   unk   Multiple Vitamins-Minerals (CERTAVITE/ANTIOXIDANTS PO) Take 1 tablet by mouth daily with  breakfast.   10/30/2022   nitroGLYCERIN (NITROSTAT) 0.4 MG SL tablet Place 0.4 mg under the tongue every 5 (five) minutes x 3 doses as needed for chest pain (Do not exceed 3 doses).   unk   polyethylene glycol (MIRALAX / GLYCOLAX) packet Take 17 g by mouth See admin instructions. Mix 17 grams of powder into 4 ounces of water and drink every morning   10/30/2022   Psyllium (FP FIBER LAXATIVE PO) Take 500 mg by mouth in the morning and at bedtime.   10/30/2022   QUEtiapine (SEROQUEL) 25 MG tablet Take 1 tablet (25 mg total) by mouth at bedtime. 30 tablet 0 10/29/2022   senna-docusate (SENOKOT-S)  8.6-50 MG tablet Take 2 tablets by mouth at bedtime.   10/29/2022   Sodium Phosphates (RA SALINE ENEMA) 19-7 GM/118ML ENEM Place 1 enema rectally daily as needed (for constipation not relieved by bisacodyl suppository- call MD if no relief from enema).   unk   SYSTANE COMPLETE 0.6 % SOLN Place 1 drop into both eyes in the morning and at bedtime.   10/30/2022   tiZANidine (ZANAFLEX) 2 MG tablet Take 1 tablet (2 mg total) by mouth at bedtime. After completing cipro (Patient taking differently: Take 2 mg by mouth at bedtime.) 30 tablet 0 10/29/2022   Zinc Oxide (Z-BUM) 22 % CREA Apply 1 Application topically See admin instructions. Apply to inner thighs and perineal area twice a day.   10/30/2022   Scheduled:  bisacodyl  10 mg Rectal BID   cloNIDine  0.1 mg Transdermal Weekly   cycloSPORINE  1 drop Both Eyes BID   Gerhardt's butt cream   Topical BID   mometasone-formoterol  2 puff Inhalation BID   pantoprazole (PROTONIX) IV  40 mg Intravenous BID   polyvinyl alcohol  1 drop Both Eyes BID   Continuous:  0.9 % NaCl with KCl 20 mEq / L 100 mL/hr at 11/02/22 0846    Results for orders placed or performed during the hospital encounter of 10/30/22 (from the past 48 hour(s))  Hemoglobin and hematocrit, blood     Status: Abnormal   Collection Time: 10/31/22  6:58 PM  Result Value Ref Range   Hemoglobin 9.3 (L) 12.0 - 15.0 g/dL   HCT 44.0 (L) 10.2 - 72.5 %    Comment: Performed at [facility], [address], [place], [place] [address]  CBC     Status: Abnormal   Collection Time: 11/01/22  4:35 AM  Result Value Ref Range   WBC 6.9 4.0 - 10.5 K/uL   RBC 2.92 (L) 3.87 - 5.11 MIL/uL   Hemoglobin 9.1 (L) 12.0 - 15.0 g/dL   HCT 03.4 (L) 74.2 - 59.5 %   MCV 99.0 80.0 - 100.0 fL   MCH 31.2 26.0 - 34.0 pg   MCHC 31.5 30.0 - 36.0 g/dL   RDW 63.8 75.6 - 43.3 %   Platelets 255 150 - 400 K/uL   nRBC 0.0 0.0 - 0.2 %    Comment: Performed at [facility], [address], [place], [place] [address]  Comprehensive metabolic panel     Status: Abnormal   Collection Time: 11/01/22  4:35 AM  Result Value Ref Range   Sodium 143 135 - 145 mmol/L   Potassium 3.5 3.5 - 5.1 mmol/L   Chloride 99 98 - 111 mmol/L   CO2 30 22 - 32 mmol/L   Glucose, Bld 99 70 - 99 mg/dL    Comment:  Glucose reference range applies only to samples taken after fasting for at least 8 hours.   BUN 17 8 - 23 mg/dL   Creatinine, Ser 9.14 0.44 - 1.00 mg/dL   Calcium 9.5 8.9 - 78.2 mg/dL   Total Protein 6.5 6.5 - 8.1 g/dL   Albumin 3.1 (L) 3.5 - 5.0 g/dL   AST 42 (H) 15 - 41 U/L   ALT 41 0 - 44 U/L   Alkaline Phosphatase 57 38 - 126 U/L   Total Bilirubin 0.6 0.3 - 1.2 mg/dL   GFR, Estimated >95 >62 mL/min    Comment: (NOTE) Calculated using the CKD-EPI Creatinine Equation (2021)    Anion gap 14 5 - 15    Comment: Performed at [facility], [address] [place], [place] [address]  Magnesium     Status: None   Collection Time: 11/01/22  4:35 AM  Result Value Ref Range   Magnesium 1.9 1.7 - 2.4 mg/dL    Comment: Performed at [facility], [address], [place], [place] [address]  Phosphorus     Status: None   Collection Time: 11/01/22  4:35 AM  Result Value Ref Range   Phosphorus 2.7 2.5 - 4.6 mg/dL    Comment: Performed at [facility], [address], [place], [place] [address]  Hemoglobin and hematocrit, blood     Status: Abnormal   Collection Time: 11/01/22  6:39 PM  Result Value Ref Range   Hemoglobin 9.7 (L) 12.0 - 15.0 g/dL   HCT 28.4 (L) 13.2 - 44.0 %    Comment: Performed at [facility], [address] [place], [place] [address]  Basic metabolic panel     Status: Abnormal   Collection Time: 11/02/22  4:36 AM  Result Value Ref Range   Sodium 139 135 - 145 mmol/L   Potassium 3.0 (L) 3.5 - 5.1 mmol/L   Chloride 99 98 - 111 mmol/L   CO2 29 22 - 32 mmol/L   Glucose, Bld 80 70 - 99 mg/dL     Comment: Glucose reference range applies only to samples taken after fasting for at least 8 hours.   BUN 17 8 - 23 mg/dL   Creatinine, Ser 5.36 0.44 - 1.00 mg/dL   Calcium 8.8 (L) 8.9 - 10.3 mg/dL   GFR, Estimated >64 >40 mL/min    Comment: (NOTE) Calculated using the CKD-EPI Creatinine Equation (2021)    Anion gap 11 5 - 15    Comment: Performed at [facility], [address], [place], [place] [address]  CBC     Status: Abnormal   Collection Time: 11/02/22  4:36 AM  Result Value Ref Range   WBC 7.2 4.0 - 10.5 K/uL   RBC 2.81 (L) 3.87 - 5.11 MIL/uL   Hemoglobin 8.6 (L) 12.0 - 15.0 g/dL   HCT 59.5 (L) 63.8 - 75.6 %   MCV 97.5 80.0 - 100.0 fL   MCH 30.6 26.0 - 34.0 pg   MCHC 31.4 30.0 - 36.0 g/dL   RDW 43.3 29.5 - 18.8 %   Platelets 251 150 - 400 K/uL   nRBC 0.0 0.0 - 0.2 %    Comment: Performed at [facility], [address], [place], [place] [address]    DG Abd Portable 1V  Result Date: 11/02/2022 CLINICAL DATA:  Nasogastric tube placement EXAM: PORTABLE ABDOMEN - 1 VIEW COMPARISON:  11/01/2022 at 6:58 p.m. FINDINGS: Tip and side port  of the nasogastric tube project within the distal stomach. Diffuse gas distended colon and small bowel throughout the abdomen. IMPRESSION: Tip and side port of the nasogastric tube project within the distal stomach. Electronically Signed   By: Deatra Robinson M.D.   On: 11/02/2022 00:07   DG Abd Portable 1V-Small Bowel Obstruction Protocol-initial, 8 hr delay  Result Date: 11/01/2022 CLINICAL DATA:  Small-bowel obstruction.  8 hour delay. EXAM: PORTABLE ABDOMEN - 1 VIEW COMPARISON:  Abdominal radiograph dated 10/31/2022. FINDINGS: Enteric tube with tip and side port in the epigastric area in the region of the GE junction. Recommend further advancing for optimal positioning. There is diffuse dilatation of small bowel with probable air distention of the colon. Oral contrast noted in the proximal colon as well as within  the rectum. IMPRESSION: 1. Enteric tube with tip and side port in the region of the GE junction. Recommend further advancing for optimal positioning. 2. Diffuse dilatation of small bowel with oral contrast in the proximal colon and rectum. Findings favored to represent an ileus rather than a small bowel obstruction. Clinical correlation is recommended. Electronically Signed   By: Elgie Collard M.D.   On: 11/01/2022 23:02   DG Abd Portable 1V-Small Bowel Obstruction Protocol-initial, 8 hr delay  Result Date: 11/01/2022 CLINICAL DATA:  8 hour delay small-bowel obstruction protocol. EXAM: PORTABLE ABDOMEN - 1 VIEW COMPARISON:  CT abdomen and pelvis 10/31/2022 FINDINGS: No significant oral contrast identified within small bowel or colon. Diffusely dilated small bowel loops are present measuring up to 4 cm. Contrast is seen in the bladder. IMPRESSION: 1. Diffusely dilated small bowel again seen. No significant oral contrast identified in small bowel or colon. Findings are concerning for severe ileus or small-bowel obstruction. Electronically Signed   By: Darliss Cheney M.D.   On: 11/01/2022 00:17    Review of Systems  Unable to perform ROS: Dementia   Blood pressure (!) 194/79, pulse 74, temperature 98.5 F (36.9 C), temperature source Oral, resp. rate 19, height 5\' 5"  (1.651 m), weight 85.9 kg, SpO2 97%. Physical Exam Constitutional:      General: She is not in acute distress. HENT:     Head: Atraumatic.     Mouth/Throat:     Mouth: Mucous membranes are dry.  Cardiovascular:     Rate and Rhythm: Normal rate and regular rhythm.     Pulses: Normal pulses.     Heart sounds: Normal heart sounds.  Pulmonary:     Breath sounds: Normal breath sounds.  Abdominal:     General: There is distension.     Tenderness: There is no abdominal tenderness. There is no guarding.  Musculoskeletal:     Cervical back: Neck supple.  Skin:    General: Skin is warm and dry.  Neurological:     Mental Status: She  is disoriented.    Assessment/Plan: 1) Coffee-ground emesis with nausea and vomiting and partial small bowel obstruction with transition zone in the terminal ileum-NG tube in place-I had a long discussion with the patient's daughter Leslie Farley who is at the patient's bedside letting her know that the patient is not in any condition to have any invasive procedures done and that she should be maintained on comfort care under hospice.  Her daughter seems to agree wholeheartedly and chooses to have comfort care for her mother.  I have told her to discuss this with Dr. Sharen Hint the hospitalist and with the palliative care care team.  2) Chronic diastolic CHF/HTN 3) Dementia.  4) Pressure ulcer on coccyx Charna Elizabeth 11/02/2022, 1:52 PM

## 2022-11-03 ENCOUNTER — Encounter (HOSPITAL_COMMUNITY): Payer: Self-pay | Admitting: Internal Medicine

## 2022-11-03 DIAGNOSIS — K56600 Partial intestinal obstruction, unspecified as to cause: Secondary | ICD-10-CM | POA: Diagnosis not present

## 2022-11-03 LAB — MAGNESIUM: Magnesium: 1.8 mg/dL (ref 1.7–2.4)

## 2022-11-03 LAB — BASIC METABOLIC PANEL
Anion gap: 14 (ref 5–15)
BUN: 15 mg/dL (ref 8–23)
CO2: 24 mmol/L (ref 22–32)
Calcium: 9 mg/dL (ref 8.9–10.3)
Chloride: 101 mmol/L (ref 98–111)
Creatinine, Ser: 0.58 mg/dL (ref 0.44–1.00)
GFR, Estimated: >60 mL/min (ref >60)
Glucose, Bld: 79 mg/dL (ref 70–99)
Potassium: 3.6 mmol/L (ref 3.5–5.1)
Sodium: 139 mmol/L (ref 135–145)

## 2022-11-03 LAB — CBC
HCT: 30.8 % — ABNORMAL LOW (ref 36.0–46.0)
Hemoglobin: 9.7 g/dL — ABNORMAL LOW (ref 12.0–15.0)
MCH: 31.2 pg (ref 26.0–34.0)
MCHC: 31.5 g/dL (ref 30.0–36.0)
MCV: 99 fL (ref 80.0–100.0)
Platelets: 236 10*3/uL (ref 150–400)
RBC: 3.11 MIL/uL — ABNORMAL LOW (ref 3.87–5.11)
RDW: 14.6 % (ref 11.5–15.5)
WBC: 7 10*3/uL (ref 4.0–10.5)
nRBC: 0 % (ref 0.0–0.2)

## 2022-11-03 MED ORDER — MAGNESIUM SULFATE 2 GM/50ML IV SOLN
2.0000 g | Freq: Once | INTRAVENOUS | Status: AC
  Administered 2022-11-03: 2 g via INTRAVENOUS
  Filled 2022-11-03: qty 50

## 2022-11-03 MED ORDER — POTASSIUM CHLORIDE 10 MEQ/100ML IV SOLN
10.0000 meq | INTRAVENOUS | Status: AC
  Administered 2022-11-03 (×4): 10 meq via INTRAVENOUS
  Filled 2022-11-03: qty 100

## 2022-11-03 MED ORDER — DOCUSATE SODIUM 100 MG PO CAPS
100.0000 mg | ORAL_CAPSULE | Freq: Two times a day (BID) | ORAL | Status: DC
  Administered 2022-11-04 – 2022-11-13 (×14): 100 mg via ORAL
  Filled 2022-11-03 (×19): qty 1

## 2022-11-03 MED ORDER — BISACODYL 10 MG RE SUPP
10.0000 mg | Freq: Every day | RECTAL | Status: DC
  Administered 2022-11-05 – 2022-11-13 (×7): 10 mg via RECTAL
  Filled 2022-11-03 (×9): qty 1

## 2022-11-03 MED ORDER — POLYETHYLENE GLYCOL 3350 17 G PO PACK: 17.0000 g | PACK | Freq: Every day | ORAL | Status: DC | PRN

## 2022-11-03 NOTE — Evaluation (Signed)
Clinical/Bedside Swallow Evaluation Patient Details  Name: Leslie Farley MRN: [id number] Date of Birth: 1930/06/18  Today's Date: 11/03/2022 Time: SLP Start Time (ACUTE ONLY): 1240 SLP Stop Time (ACUTE ONLY): 1254 SLP Time Calculation (min) (ACUTE ONLY): 14 min  Past Medical History:  Past Medical History:  Diagnosis Date   Arthritis    Asthma    COPD (chronic obstructive pulmonary disease) (HCC)    GERD (gastroesophageal reflux disease)    Hypertension    Past Surgical History:  Past Surgical History:  Procedure Laterality Date   CHOLECYSTECTOMY     HPI:       Assessment / Plan / Recommendation  Clinical Impression  Patient is presenting with clinical s/s of what currently appears to be a cognitive-based dysphagia. She had eyes closed when SLP arrived but she awakened to voice.  SLP spoke single [place] words to patient for swallow and drink but she did not follow directions.  Initially she refused all p.o. trials including apple juice, water, and Jell-O.  However when acquired [place] ice she readily open her mouth.  She exhibited clinical indications of reduced oral preparatory phase of swallow with delayed oral transit and delayed swallow initiation.  Multiple reflexive swallows noted across all boluses but no overt s/s aspiration observed.  Uncertain if multiple swallows was due to oral and or pharyngeal retention.  Patient consumed entire [place] ice and few sips of apple juice and water but then declined further p.o. recommend clear liquids and small amounts allowing patient time to conduct dry swallows with every bolus.  Anticipate nutrition and hydration adequacy will be unrealistic with her current level of swallow dysfunction, uncertain to prognosis for swallow function or intake to improve. SLP Visit Diagnosis: Dysphagia, oral phase (R13.11)    Aspiration Risk       Diet Recommendation Other (Comment) (clear liquids)    Medication Administration: Crushed with puree  (May try with [place] ice) Supervision: Staff to assist with self feeding Compensations: Slow rate;Small sips/bites (Allow patient to conduct dry swallows (multiple) with all boluses) Postural Changes: Seated upright at 90 degrees;Remain upright for at least 30 minutes after po intake    Other  Recommendations Oral Care Recommendations: Oral care QID;Staff/trained caregiver to provide oral care    Recommendations for follow up therapy are one component of a multi-disciplinary discharge planning process, led by the attending physician.  Recommendations may be updated based on patient status, additional functional criteria and insurance authorization.  Follow up Recommendations Skilled nursing-short term rehab (<3 hours/day)      Assistance Recommended at Discharge    Functional Status Assessment Patient has had a recent decline in their functional status and demonstrates the ability to make significant improvements in function in a reasonable and predictable amount of time.  Frequency and Duration min 2x/week  2 weeks       Prognosis Prognosis for improved oropharyngeal function: Fair Barriers to Reach Goals: Cognitive deficits      Swallow Study   General      Oral/Motor/Sensory Function Overall Oral Motor/Sensory Function: Other (comment) (Patient did not follow directions no focal weakness noted)   Ice Chips Ice chips: Not tested   Thin Liquid Thin Liquid: Impaired Presentation: Straw Oral Phase Impairments: Poor awareness of bolus Oral Phase Functional Implications: Prolonged oral transit Pharyngeal  Phase Impairments: Multiple swallows    Nectar Thick Nectar Thick Liquid: Not tested   Honey Thick Honey Thick Liquid: Not tested   Puree Puree: Impaired ([place] ice) Presentation: Spoon Oral  Phase Impairments: Other (comment);Reduced lingual movement/coordination Oral Phase Functional Implications: Prolonged oral transit Pharyngeal Phase Impairments: Multiple swallows    Solid     Solid: Not tested Other Comments: Patient did not open oral cavity to accept any solids      Chales Abrahams 11/03/2022,5:58 PM  [name], MS [facility] SLP Acute Rehab Services Office [phone]

## 2022-11-03 NOTE — Care Management Important Message (Signed)
Important Message  Patient Details No IM Letter given due to Hospice. Name: Leslie Farley MRN: [id number] Date of Birth: 04-13-1930   Medicare Important Message Given:  No     Caren Macadam 11/03/2022, 11:45 AM

## 2022-11-03 NOTE — Progress Notes (Signed)
[facility] Patient Note Ms. Jeanmarie Sollman is a current hospice patient, start of care 8.29.24 with hospice diagnosos of COPD, followed at [facility] who was admitted to [facility] on 9.7.24 with primary diagnosis of small bowel obstruction. Per Dr. Gordy Savers, hospice physician, this is a related hospital admission.   Patient resting comfortably without signs of distress when visited. Noted patient has had multiple bowel movements since starting suppositories. The patient removed her NGT and it was not replaced. Notes indicate plan to increase diet if tolerated. No change in plan for patient to discharge back to [facility] when medically ready.   Patient remains GIP appropriate due to need for IV fluids, protonix and electrolyte replacement as tolerance of advanced diet is evaluated.  Vital Signs: 97.8/77/16     180/96    O2 100% on RA I&O: Abnormal labs: Hgb 9.7, HCT 30.8  Diagnostics: none new  IV/PRN Meds: Protonix 40mg  IV BID, NS with of KCL at 163ml/hr, Hydralazine 5mg  every 6 hours as needed for BP  Problem List: Vomiting with significantly distended abdomen Pseudoobstruction/ileus ?pSBO: Per surgery likely pseudoobstruction.  Started on Dulcolax with LARGE BM. NG Tube got dislodged overnight- ccs aware. Cont supportive care, suppository antiemetics as needed.  Seen by GI and signed off.surgery following closely, hard to BM after suppository.  Discussed with surgery okay to leave NG out SLP consult for swallow eval for clear liquid trial if she passes, continue daily suppository bowel regimen and mobilize as able Patient is nonambulatory and currently in hospice but family wishes to continue with limited intervention    Reported coffee-ground emesis FOBT positive stool Normocytic anemia: Hemoglobin overall stable, seen by GI who discussed with the patient daughter and family wants to continue hospice supportive care and no  intervention/procedure per Dr.Mann.cont ppi Last Labs         Recent Labs  Lab 10/31/22 1858 11/01/22 0435 11/01/22 1839 11/02/22 0436 11/03/22 0449  HGB 9.3* 9.1* 9.7* 8.6* 9.7*  HCT 29.6* 28.9* 30.6* 27.4* 30.8*      Hypokalemia: Resolved , cont ivf w/ kcl   Dementia: She is alert awake continue supportive care delirium precaution.  Mittens in place to protect tubes and lines   Chronic diastolic CHF: On IV fluid hydration for #1 continue cautiously monitor daily weight and intake output  Net IO Since Admission: 3,765.73 mL [11/03/22 1051]       Filed Weights    11/01/22 0500 11/02/22 1204 11/03/22 0500  Weight: 86.1 kg 85.9 kg 85.9 kg     Hypertension: BP was poorly controlled now much better after starting clonidine patch 0.1 mg weekly home amlodipine on hold  as unable to use p.o. due to SBO.  Discharge Planning: Ongoing, plan to return to [facility] upon Discharge with Hospice Services  IDT: Updated  Goals of Care: DNR  Should patient need ambulance transfer at discharge- please use [facility]) as they contract this service for our active hospice patients.  Glenna Fellows BSN, RN, [facility] hospital liaison [phone]

## 2022-11-03 NOTE — Plan of Care (Signed)
  Problem: Clinical Measurements: Goal: Ability to maintain clinical measurements within normal limits will improve Outcome: Progressing   Problem: Clinical Measurements: Goal: Will remain free from infection Outcome: Progressing   Problem: Clinical Measurements: Goal: Diagnostic test results will improve Outcome: Progressing   Problem: Nutrition: Goal: Adequate nutrition will be maintained Outcome: Progressing   

## 2022-11-03 NOTE — Progress Notes (Signed)
PROGRESS NOTE Beverley Bynum  ZHY:[id number] DOB: 05/14/1930 DOA: 10/30/2022 PCP: Pcp, No  Brief Narrative/Hospital Course: 89 y.o. female with medical history significant for chronic diastolic CHF, hypertension, advanced dementia recently enrolled in hospice care who presents from SNF due to reportedly vomiting coffee-ground emesis and severely distended abdomen. In the ED: Afebrile vitals stable not hypoxic, labs stable renal function mild anemia CT abdomen and pelvis with contrast revealed findings suggestive of partial small bowel obstruction with a transition zone at the level of the terminal ileum, small right pleural effusion.  The patient's daughter at bedside was also requesting NG tube placement, NGT was placed following which large amount of fluid removed, and subsequently admitted for further management. Patient without much improvement, general surgery felt ileus/pseudoobstruction rather than bowel obstruction started on Dulcolax PR 9/10 and patient had bowel movements described as " blowout"  Subjective: Patient seen and examined this morning she is moaning and groaning able to tell me her name Mitten in place Overnight patient managed to remove NG tube despite having mittens  Patient did have bowel movements x 2 nursing reported "blowout" Labs reviewed appears overall stable hb up at 9.7   Assessment and Plan: Principal Problem:   Partial small bowel obstruction (HCC)  Vomiting with significantly distended abdomen Pseudoobstruction/ileus ?pSBO: Per surgery likely pseudoobstruction.  Started on Dulcolax with LARGE BM. NG Tube got dislodged overnight- ccs aware. Cont supportive care, suppository antiemetics as needed.  Seen by GI and signed off.surgery following closely, hard to BM after suppository.  Discussed with surgery okay to leave NG out SLP consult for swallow eval for clear liquid trial if she passes, continue daily suppository bowel regimen and mobilize as able Patient  is nonambulatory and currently in hospice but family wishes to continue with limited intervention   Reported coffee-ground emesis FOBT positive stool Normocytic anemia: Hemoglobin overall stable, seen by GI who discussed with the patient daughter and family wants to continue hospice supportive care and no intervention/procedure per Dr.Mann.cont ppi Recent Labs  Lab 10/31/22 1858 11/01/22 0435 11/01/22 1839 11/02/22 0436 11/03/22 0449  HGB 9.3* 9.1* 9.7* 8.6* 9.7*  HCT 29.6* 28.9* 30.6* 27.4* 30.8*   Hypokalemia: Resolved , cont ivf w/ kcl  Dementia: She is alert awake continue supportive care delirium precaution.  Mittens in place to protect tubes and lines  Chronic diastolic CHF: On IV fluid hydration for #1 continue cautiously monitor daily weight and intake output  Net IO Since Admission: 3,765.73 mL [11/03/22 1051]  Filed Weights   11/01/22 0500 11/02/22 1204 11/03/22 0500  Weight: 86.1 kg 85.9 kg 85.9 kg    Ambulatory dysfunction: Currently bedbound  Hypertension: BP was poorly controlled now much better after starting clonidine patch 0.1 mg weekly home amlodipine on hold  as unable to use p.o. due to SBO.  Class I Obesity:Patient's Body mass index is 31.51 kg/m. : Will benefit with PCP follow-up, weight loss  healthy lifestyle  Goals of care: Patient is DNR currently in hospice services goal is to keep her comfortable and discharged to facility with hospice services.  Care involved, hospice/[facility] also involved and in discussion with the daughter- whether to transition to inpatient hospice defer to hospice team  Pressure ulcer POA Pressure Injury 09/16/22 Coccyx Posterior;Mid Stage 1 -  Intact skin with non-blanchable redness of a localized area usually over a bony prominence. Ol wound healed, still not blachable (Active)  09/16/22 1240  Location: Coccyx  Location Orientation: Posterior;Mid  Staging: Stage 1 -  Intact skin  with non-blanchable redness of a  localized area usually over a bony prominence.  Wound Description (Comments): Ol wound healed, still not blachable  Present on Admission: Yes  Dressing Type Foam - Lift dressing to assess site every shift 10/31/22 2000   DVT prophylaxis: SCDs Start: 10/31/22 0349 Code Status:   Code Status: Limited: Do not attempt resuscitation (DNR) -DNR-LIMITED -Do Not Intubate/DNI  Family Communication: plan of care discussed with patient Patient status is: Inpatient because of bowel obstruction Level of care: Telemetry  Dispo: The patient is from: LTC w/ hospice            Anticipated disposition: Same.  Objective: Vitals last 24 hrs: Vitals:   11/03/22 0200 11/03/22 0243 11/03/22 0500 11/03/22 0511  BP:  (!) 178/82  (!) 146/83  Pulse:  79  75  Resp:  19    Temp: 98.1 F (36.7 C) 98.1 F (36.7 C)  98.1 F (36.7 C)  TempSrc: Oral     SpO2:  100%  100%  Weight:   85.9 kg   Height:       Weight change:   Physical Examination: General exam: alert awake, oriented HEENT:Oral mucosa moist, Ear/Nose WNL grossly Respiratory system: Bilaterally clear BS,no use of accessory muscle Cardiovascular system: S1 & S2 +, No JVD. Gastrointestinal system: Abdomen soft, distended nontender  Nervous System: Alert, awake, moving arms Extremities: LE edema neg,distal peripheral pulses palpable and warm.  Skin: No rashes,no icterus. MSK: Normal muscle bulk,tone, power   Medications reviewed:  Scheduled Meds:  [START ON 11/04/2022] bisacodyl  10 mg Rectal Daily   cloNIDine  0.1 mg Transdermal Weekly   cycloSPORINE  1 drop Both Eyes BID   docusate sodium  100 mg Oral BID   Gerhardt's butt cream   Topical BID   mometasone-formoterol  2 puff Inhalation BID   pantoprazole (PROTONIX) IV  40 mg Intravenous BID   polyvinyl alcohol  1 drop Both Eyes BID   Continuous Infusions:  0.9 % NaCl with KCl 20 mEq / L 100 mL/hr at 11/03/22 0644   Diet Order     None     Every morning Intake/Output Summary (Last  24 hours) at 11/03/2022 1051 Last data filed at 11/03/2022 1000 Gross per 24 hour  Intake 1970.82 ml  Output 100 ml  Net 1870.82 ml   Net IO Since Admission: 3,765.73 mL [11/03/22 1051]  Wt Readings from Last 3 Encounters:  11/03/22 85.9 kg  10/16/22 85.2 kg  09/15/22 82.4 kg     Unresulted Labs (From admission, onward)     Start     Ordered   11/02/22 0500  Basic metabolic panel  Daily,   R      11/01/22 1158   11/02/22 0500  CBC  Daily,   R      11/01/22 1158          Data Reviewed: I have personally reviewed following labs and imaging studies CBC: Recent Labs  Lab 10/31/22 0055 10/31/22 0647 10/31/22 1858 11/01/22 0435 11/01/22 1839 11/02/22 0436 11/03/22 0449  WBC 7.6  --   --  6.9  --  7.2 7.0  NEUTROABS 5.8  --   --   --   --   --   --   HGB 11.1*   < > 9.3* 9.1* 9.7* 8.6* 9.7*  HCT 35.1*   < > 29.6* 28.9* 30.6* 27.4* 30.8*  MCV 97.5  --   --  99.0  --  97.5 99.0  PLT 301  --   --  255  --  251 236   < > = values in this interval not displayed.   Basic Metabolic Panel: Recent Labs  Lab 10/31/22 0055 11/01/22 0435 11/02/22 0436 11/03/22 0449  NA 138 143 139 139  K 3.7 3.5 3.0* 3.6  CL 97* 99 99 101  CO2 33* 30 29 24   GLUCOSE 125* 99 80 79  BUN 19 17 17 15   CREATININE 0.71 0.57 0.59 0.58  CALCIUM 9.2 9.5 8.8* 9.0  MG  --  1.9  --  1.8  PHOS  --  2.7  --   --    GFR: Estimated Creatinine Clearance: 51.6 mL/min (by C-G formula based on SCr of 0.58 mg/dL). Liver Function Tests: Recent Labs  Lab 11/01/22 0435  AST 42*  ALT 41  ALKPHOS 57  BILITOT 0.6  PROT 6.5  ALBUMIN 3.1*  Antimicrobials: Anti-infectives (From admission, onward)    None      Culture/Microbiology    Component Value Date/Time   SDES URINE, CLEAN CATCH 10/11/2022 1345   SPECREQUEST  10/11/2022 1345    NONE Performed at [facility], [address], [place], [place] [address]    CULT >=100,000 COLONIES/mL KLEBSIELLA PNEUMONIAE (A) 10/11/2022 1345    REPTSTATUS 10/13/2022 FINAL 10/11/2022 1345  Radiology Studies: DG Abd Portable 1V-Small Bowel Obstruction Protocol-24 hr delay  Result Date: 11/02/2022 CLINICAL DATA:  Small bowel obstruction.  24 hour delayed images. EXAM: PORTABLE ABDOMEN - 1 VIEW COMPARISON:  11/01/2022 11:29 p.m. FINDINGS: Nasogastric tube unchanged with tip over the right upper quadrant likely over the distal stomach or proximal duodenum. There multiple air-filled large and small bowel loops without significant change. Contrast is present within the colon, mostly over the rectosigmoid colon. Findings suggest ileus. Remainder the exam is unchanged. IMPRESSION: Multiple air-filled large and small bowel loops without significant change with contrast present within the colon, mostly over the rectosigmoid colon. Findings suggest ileus. Electronically Signed   By: Elberta Fortis M.D.   On: 11/02/2022 14:45   DG Abd Portable 1V  Result Date: 11/02/2022 CLINICAL DATA:  Nasogastric tube placement EXAM: PORTABLE ABDOMEN - 1 VIEW COMPARISON:  11/01/2022 at 6:58 p.m. FINDINGS: Tip and side port of the nasogastric tube project within the distal stomach. Diffuse gas distended colon and small bowel throughout the abdomen. IMPRESSION: Tip and side port of the nasogastric tube project within the distal stomach. Electronically Signed   By: Deatra Robinson M.D.   On: 11/02/2022 00:07   DG Abd Portable 1V-Small Bowel Obstruction Protocol-initial, 8 hr delay  Result Date: 11/01/2022 CLINICAL DATA:  Small-bowel obstruction.  8 hour delay. EXAM: PORTABLE ABDOMEN - 1 VIEW COMPARISON:  Abdominal radiograph dated 10/31/2022. FINDINGS: Enteric tube with tip and side port in the epigastric area in the region of the GE junction. Recommend further advancing for optimal positioning. There is diffuse dilatation of small bowel with probable air distention of the colon. Oral contrast noted in the proximal colon as well as within the rectum. IMPRESSION: 1. Enteric tube  with tip and side port in the region of the GE junction. Recommend further advancing for optimal positioning. 2. Diffuse dilatation of small bowel with oral contrast in the proximal colon and rectum. Findings favored to represent an ileus rather than a small bowel obstruction. Clinical correlation is recommended. Electronically Signed   By: Elgie Collard M.D.   On: 11/01/2022 23:02     LOS: 3 days  Lanae Boast, MD [facility]  11/03/2022, 10:51 AM

## 2022-11-03 NOTE — Progress Notes (Addendum)
Pt with mittens on still managed to dislodge NG tube from 72 cm marked to 24 cm. RN attempted to advance and was unable to advance. RN completely pulled NG tube and contacted on call MD J.Garner Nash for further orders. Pt is not currently in any distress.   Orders received to keep NG tube out and monitor. Orders followed Pt is asleep, NAD noted.  Pt had a very large loose watery BM tonight.

## 2022-11-03 NOTE — Progress Notes (Signed)
[facility] Progress Note     Subjective: CC-  Patient comfortable this morning. She pulled NG out over night, it only put out about 100cc yesterday. She had two large Bms yesterday, both after suppository. Unsure if passing flatus this morning. Per RN her abdominal distension is less than yesterday.  Objective: Vital signs in last 24 hours: Temp:  [97.9 F (36.6 C)-98.5 F (36.9 C)] 98.1 F (36.7 C) (09/10 0511) Pulse Rate:  [74-79] 75 (09/10 0511) Resp:  [18-19] 19 (09/10 0243) BP: (146-194)/(78-97) 146/83 (09/10 0511) SpO2:  [96 %-100 %] 100 % (09/10 0511) Weight:  [85.9 kg] 85.9 kg (09/10 0500) Last BM Date : 11/02/22  Intake/Output from previous day: 09/09 0701 - 09/10 0700 In: 1970.8 [I.V.:1601.4; IV Piggyback:369.5] Out: 100 [Emesis/NG output:100] Intake/Output this shift: No intake/output data recorded.  PE: Gen:  Alert, NAD Pulm:  rate and effort normal Abd: distended but soft, nontender, hyperactive bowel sounds   Lab Results:  Recent Labs    11/02/22 0436 11/03/22 0449  WBC 7.2 7.0  HGB 8.6* 9.7*  HCT 27.4* 30.8*  PLT 251 236   BMET Recent Labs    11/02/22 0436 11/03/22 0449  NA 139 139  K 3.0* 3.6  CL 99 101  CO2 29 24  GLUCOSE 80 79  BUN 17 15  CREATININE 0.59 0.58  CALCIUM 8.8* 9.0   PT/INR No results for input(s): "LABPROT", "INR" in the last 72 hours. CMP     Component Value Date/Time   NA 139 11/03/2022 0449   NA 138 06/03/2016 0000   K 3.6 11/03/2022 0449   CL 101 11/03/2022 0449   CO2 24 11/03/2022 0449   GLUCOSE 79 11/03/2022 0449   BUN 15 11/03/2022 0449   BUN 15 06/03/2016 0000   CREATININE 0.58 11/03/2022 0449   CALCIUM 9.0 11/03/2022 0449   PROT 6.5 11/01/2022 0435   ALBUMIN 3.1 (L) 11/01/2022 0435   AST 42 (H) 11/01/2022 0435   ALT 41 11/01/2022 0435   ALKPHOS 57 11/01/2022 0435   BILITOT 0.6 11/01/2022 0435   GFRNONAA >60 11/03/2022 0449   GFRAA >60 10/31/2014 0515   Lipase     Component Value  Date/Time   LIPASE 32 09/15/2022 1600       Studies/Results: DG Abd Portable 1V-Small Bowel Obstruction Protocol-24 hr delay  Result Date: 11/02/2022 CLINICAL DATA:  Small bowel obstruction.  24 hour delayed images. EXAM: PORTABLE ABDOMEN - 1 VIEW COMPARISON:  11/01/2022 11:29 p.m. FINDINGS: Nasogastric tube unchanged with tip over the right upper quadrant likely over the distal stomach or proximal duodenum. There multiple air-filled large and small bowel loops without significant change. Contrast is present within the colon, mostly over the rectosigmoid colon. Findings suggest ileus. Remainder the exam is unchanged. IMPRESSION: Multiple air-filled large and small bowel loops without significant change with contrast present within the colon, mostly over the rectosigmoid colon. Findings suggest ileus. Electronically Signed   By: Elberta Fortis M.D.   On: 11/02/2022 14:45   DG Abd Portable 1V  Result Date: 11/02/2022 CLINICAL DATA:  Nasogastric tube placement EXAM: PORTABLE ABDOMEN - 1 VIEW COMPARISON:  11/01/2022 at 6:58 p.m. FINDINGS: Tip and side port of the nasogastric tube project within the distal stomach. Diffuse gas distended colon and small bowel throughout the abdomen. IMPRESSION: Tip and side port of the nasogastric tube project within the distal stomach. Electronically Signed   By: Deatra Robinson M.D.   On: 11/02/2022 00:07   DG Abd Portable  1V-Small Bowel Obstruction Protocol-initial, 8 hr delay  Result Date: 11/01/2022 CLINICAL DATA:  Small-bowel obstruction.  8 hour delay. EXAM: PORTABLE ABDOMEN - 1 VIEW COMPARISON:  Abdominal radiograph dated 10/31/2022. FINDINGS: Enteric tube with tip and side port in the epigastric area in the region of the GE junction. Recommend further advancing for optimal positioning. There is diffuse dilatation of small bowel with probable air distention of the colon. Oral contrast noted in the proximal colon as well as within the rectum. IMPRESSION: 1. Enteric tube  with tip and side port in the region of the GE junction. Recommend further advancing for optimal positioning. 2. Diffuse dilatation of small bowel with oral contrast in the proximal colon and rectum. Findings favored to represent an ileus rather than a small bowel obstruction. Clinical correlation is recommended. Electronically Signed   By: Elgie Collard M.D.   On: 11/01/2022 23:02    Anti-infectives: Anti-infectives (From admission, onward)    None        Assessment/Plan 89 y/o F w/ multiple comorbidities and hx of cholecystectomy who presents with coffee ground emesis and CT evidence of ileus/pseudo-obstruction - NG pulled out over night - minimal out yesterday and she has had two large Bms. Ok to leave NG out. SLP consult for swallow eval - ok to trial clear liquids if she passes. Continue daily suppositories, bowel regimen. Mobilize as able.  ID - none FEN - NPO VTE - ok for chemical dvt ppx from surgical standpoint Foley - none  GIB - h/h stable. GI has seen Advanced dementia, FTT on hospice care HTN CHF  I reviewed Consultant gastroenterology notes, hospitalist notes, last 24 h vitals and pain scores, last 48 h intake and output, last 24 h labs and trends, and last 24 h imaging results.    LOS: 3 days    Franne Forts, [facility] 11/03/2022, 8:43 AM Please see Amion for pager number during day hours 7:00am-4:30pm

## 2022-11-03 NOTE — Progress Notes (Signed)
Bedside evaluation complete full report to follow.  Initially patient was very resistant to any p.o. intake however after encouragement she consumed an entire [place] ice, approximately 1 ounce of water, approximately 1 ounce of apple juice but still continued to decline to take any Jell-O.  She does not follow any directions for oral motor exam likely because of her dementia.  Multiple subswallows noted with each bolus concerning for oral and/or pharyngeal retention with occasional throat clearing with straw boluses of thin.  At this time recommend consider initiating clear liquids from floor stock with liquids via teaspoon hopeful for improvement as patient medically stabilizes and question potential edema from NG placement (if she was agitated with NG in place) recommend consider attempting medications with [place] ice crushed but doubtful patient will swallow any p.o. medications regardless of administration.  Will follow-up briefly for education with family but if swallow function and mentation does not improve doubtful patient will be able to maintain nutrition and hydration.  [name], MS [facility] SLP Acute [employer] [phone]

## 2022-11-04 DIAGNOSIS — K56600 Partial intestinal obstruction, unspecified as to cause: Secondary | ICD-10-CM | POA: Diagnosis not present

## 2022-11-04 LAB — BASIC METABOLIC PANEL
Anion gap: 10 (ref 5–15)
BUN: 11 mg/dL (ref 8–23)
CO2: 24 mmol/L (ref 22–32)
Calcium: 8.6 mg/dL — ABNORMAL LOW (ref 8.9–10.3)
Chloride: 105 mmol/L (ref 98–111)
Creatinine, Ser: 0.59 mg/dL (ref 0.44–1.00)
GFR, Estimated: >60 mL/min (ref >60)
Glucose, Bld: 90 mg/dL (ref 70–99)
Potassium: 3.6 mmol/L (ref 3.5–5.1)
Sodium: 139 mmol/L (ref 135–145)

## 2022-11-04 LAB — CBC
HCT: 30.3 % — ABNORMAL LOW (ref 36.0–46.0)
Hemoglobin: 9.4 g/dL — ABNORMAL LOW (ref 12.0–15.0)
MCH: 30.1 pg (ref 26.0–34.0)
MCHC: 31 g/dL (ref 30.0–36.0)
MCV: 97.1 fL (ref 80.0–100.0)
Platelets: 248 10*3/uL (ref 150–400)
RBC: 3.12 MIL/uL — ABNORMAL LOW (ref 3.87–5.11)
RDW: 14.5 % (ref 11.5–15.5)
WBC: 5.3 10*3/uL (ref 4.0–10.5)
nRBC: 0 % (ref 0.0–0.2)

## 2022-11-04 LAB — MAGNESIUM: Magnesium: 1.6 mg/dL — ABNORMAL LOW (ref 1.7–2.4)

## 2022-11-04 MED ORDER — MAGNESIUM SULFATE 4 GM/100ML IV SOLN
4.0000 g | Freq: Once | INTRAVENOUS | Status: AC
  Administered 2022-11-04: 4 g via INTRAVENOUS
  Filled 2022-11-04: qty 100

## 2022-11-04 MED ORDER — POTASSIUM CHLORIDE 10 MEQ/100ML IV SOLN
10.0000 meq | INTRAVENOUS | Status: AC
  Administered 2022-11-04 (×2): 10 meq via INTRAVENOUS
  Filled 2022-11-04 (×2): qty 100

## 2022-11-04 NOTE — Progress Notes (Signed)
PROGRESS NOTE    Leslie Farley  XWR:[id number] DOB: June 09, 1930 DOA: 10/30/2022 PCP: Oneita Hurt, No   Brief Narrative: Leslie Farley is a 89 y.o. female with a history of diastolic heart failure, hypertension, dementia currently on hospice.  Patient presented secondary to vomiting with coffee-ground emesis in addition to a severely distended abdomen.  On admission, patient was found to have evidence of a partial small obstruction with transition point in the terminal ileum.  Gastroenterology was consulted for coffee-ground emesis with decision made for conservative management secondary to goals of care.  General surgery was consulted for obstruction within assessment more consistent with likely pseudoobstruction/ileus.  Patient managed with suppositories with development of bowel movements.    Assessment and Plan:  Pseudoobstruction/ileus Initial CT scan of the abdomen/pelvis was concerning for a partial bowel obstruction with transition point in the terminal ileum.  General surgery is consulted with assessment more consistent with pseudoobstruction versus ileus.  NG tube was placed but dislodged and not reinserted.  Patient started on Dulcolax suppositories per general surgery.  Patient having bowel movements with Dulcolax suppositories. General surgery recommendations: Continue daily suppositories, mobilize  Coffee-ground emesis FOBT positive stool GI consulted and decision for conservative management secondary to goals of care. Hemoglobin stable.  Chronic anemia Baseline hemoglobin is around 9.  Hemoglobin of 11.1 g/dL on admission with drop to low of 8.6.  Complicated by concern for coffee-ground emesis.  Hemoglobin has remained stable without need for blood transfusion.  Hypokalemia Resolved with supplementation.  Hypomagnesemia Patient given magnesium supplementation  Dementia -Delirium precautions  Chronic diastolic heart failure Stable. -Discontinue IV fluids  Ambulatory  dysfunction Bedbound.  Primary hypertension -Continue clonidine  Obesity Estimated body mass index is 31.51 kg/m as calculated from the following:   Height as of this encounter: 5\' 5"  (1.651 m).   Weight as of this encounter: 85.9 kg.   DVT prophylaxis: SCDs Start: 10/31/22 0349 Code Status:   Code Status: Limited: Do not attempt resuscitation (DNR) -DNR-LIMITED -Do Not Intubate/DNI  Family Communication: None at bedside Disposition Plan: Discharge back to SNF with hospice, likely discharge in 24 to 48 hours   Consultants:  General surgery Palliative care medicine Gastroenterology  Procedures:  None  Antimicrobials: None    Subjective: Patient reports not feeling well but cannot say what specifically is bothering her. Per nursing, patient had a big bowel movement overnight.  Objective: BP 139/79 (BP Location: Left Arm)   Pulse 73   Temp 97.7 F (36.5 C) (Oral)   Resp 17   Ht 5\' 5"  (1.651 m)   Wt 85.9 kg   SpO2 97%   BMI 31.51 kg/m   Examination:  General exam: Appears calm and comfortable Respiratory system: Clear to auscultation. Respiratory effort normal. Cardiovascular system: S1 & S2 heard, RRR. No murmurs, rubs, gallops or clicks. Gastrointestinal system: Abdomen is  very distended, soft and non-tender. Decreased bowel sounds heard. Central nervous system: Alert and oriented to person only. Musculoskeletal: No edema. No calf tenderness   Data Reviewed: I have personally reviewed following labs and imaging studies  CBC Lab Results  Component Value Date   WBC 5.3 11/04/2022   RBC 3.12 (L) 11/04/2022   HGB 9.4 (L) 11/04/2022   HCT 30.3 (L) 11/04/2022   MCV 97.1 11/04/2022   MCH 30.1 11/04/2022   PLT 248 11/04/2022   MCHC 31.0 11/04/2022   RDW 14.5 11/04/2022   LYMPHSABS 1.3 10/31/2022   MONOABS 0.5 10/31/2022   EOSABS 0.0 10/31/2022   BASOSABS  0.0 10/31/2022     Last metabolic panel Lab Results  Component Value Date   NA 139 11/04/2022    K 3.6 11/04/2022   CL 105 11/04/2022   CO2 24 11/04/2022   BUN 11 11/04/2022   CREATININE 0.59 11/04/2022   GLUCOSE 90 11/04/2022   GFRNONAA >60 11/04/2022   GFRAA >60 10/31/2014   CALCIUM 8.6 (L) 11/04/2022   PHOS 2.7 11/01/2022   PROT 6.5 11/01/2022   ALBUMIN 3.1 (L) 11/01/2022   BILITOT 0.6 11/01/2022   ALKPHOS 57 11/01/2022   AST 42 (H) 11/01/2022   ALT 41 11/01/2022   ANIONGAP 10 11/04/2022    GFR: Estimated Creatinine Clearance: 51.6 mL/min (by C-G formula based on SCr of 0.59 mg/dL).  No results found for this or any previous visit (from the past 240 hour(s)).    Radiology Studies: DG Abd Portable 1V-Small Bowel Obstruction Protocol-24 hr delay  Result Date: 11/02/2022 CLINICAL DATA:  Small bowel obstruction.  24 hour delayed images. EXAM: PORTABLE ABDOMEN - 1 VIEW COMPARISON:  11/01/2022 11:29 p.m. FINDINGS: Nasogastric tube unchanged with tip over the right upper quadrant likely over the distal stomach or proximal duodenum. There multiple air-filled large and small bowel loops without significant change. Contrast is present within the colon, mostly over the rectosigmoid colon. Findings suggest ileus. Remainder the exam is unchanged. IMPRESSION: Multiple air-filled large and small bowel loops without significant change with contrast present within the colon, mostly over the rectosigmoid colon. Findings suggest ileus. Electronically Signed   By: Elberta Fortis M.D.   On: 11/02/2022 14:45      LOS: 4 days    Jacquelin Hawking, MD [facility] 11/04/2022, 7:32 AM   If 7PM-7AM, please contact night-coverage [email or web address]

## 2022-11-04 NOTE — Progress Notes (Signed)
[facility] Progress Note     Subjective: CC-  Patient is resting comfortably.  Objective: Vital signs in last 24 hours: Temp:  [97.7 F (36.5 C)-97.8 F (36.6 C)] 97.7 F (36.5 C) (09/10 2154) Pulse Rate:  [73-77] 73 (09/10 2154) Resp:  [16-17] 17 (09/10 2154) BP: (139-180)/(79-96) 139/79 (09/10 2154) SpO2:  [97 %-100 %] 97 % (09/10 2154) Last BM Date : 11/03/22  Intake/Output from previous day: 09/10 0701 - 09/11 0700 In: 2112.8 [P.O.:120; I.V.:1805.4; IV Piggyback:187.4] Out: -  Intake/Output this shift: No intake/output data recorded.  PE: Gen: Sleeping, awakens easily Pulm:  rate and effort normal Abd: distended but soft, seems diffusely tender this morning although unclear if patient is just upset by me waking her up, hyperactive bowel sounds   Lab Results:  Recent Labs    11/03/22 0449 11/04/22 0501  WBC 7.0 5.3  HGB 9.7* 9.4*  HCT 30.8* 30.3*  PLT 236 248   BMET Recent Labs    11/03/22 0449 11/04/22 0501  NA 139 139  K 3.6 3.6  CL 101 105  CO2 24 24  GLUCOSE 79 90  BUN 15 11  CREATININE 0.58 0.59  CALCIUM 9.0 8.6*   PT/INR No results for input(s): "LABPROT", "INR" in the last 72 hours. CMP     Component Value Date/Time   NA 139 11/04/2022 0501   NA 138 06/03/2016 0000   K 3.6 11/04/2022 0501   CL 105 11/04/2022 0501   CO2 24 11/04/2022 0501   GLUCOSE 90 11/04/2022 0501   BUN 11 11/04/2022 0501   BUN 15 06/03/2016 0000   CREATININE 0.59 11/04/2022 0501   CALCIUM 8.6 (L) 11/04/2022 0501   PROT 6.5 11/01/2022 0435   ALBUMIN 3.1 (L) 11/01/2022 0435   AST 42 (H) 11/01/2022 0435   ALT 41 11/01/2022 0435   ALKPHOS 57 11/01/2022 0435   BILITOT 0.6 11/01/2022 0435   GFRNONAA >60 11/04/2022 0501   GFRAA >60 10/31/2014 0515   Lipase     Component Value Date/Time   LIPASE 32 09/15/2022 1600       Studies/Results: DG Abd Portable 1V-Small Bowel Obstruction Protocol-24 hr delay  Result Date: 11/02/2022 CLINICAL DATA:   Small bowel obstruction.  24 hour delayed images. EXAM: PORTABLE ABDOMEN - 1 VIEW COMPARISON:  11/01/2022 11:29 p.m. FINDINGS: Nasogastric tube unchanged with tip over the right upper quadrant likely over the distal stomach or proximal duodenum. There multiple air-filled large and small bowel loops without significant change. Contrast is present within the colon, mostly over the rectosigmoid colon. Findings suggest ileus. Remainder the exam is unchanged. IMPRESSION: Multiple air-filled large and small bowel loops without significant change with contrast present within the colon, mostly over the rectosigmoid colon. Findings suggest ileus. Electronically Signed   By: Elberta Fortis M.D.   On: 11/02/2022 14:45    Anti-infectives: Anti-infectives (From admission, onward)    None        Assessment/Plan 89 y/o F w/ multiple comorbidities and hx of cholecystectomy who presents with coffee ground emesis and CT evidence of ileus/pseudo-obstruction -Abdomen remains distended but she is tolerating a diet without vomiting, and continues to have bowel movements.  Continue daily suppositories, bowel regimen. Mobilize as able. -Surgery team will be available as needed.   ID - none FEN - CLD VTE - ok for chemical dvt ppx from surgical standpoint Foley - none  GIB - h/h stable. GI has seen Advanced dementia, FTT on hospice care HTN CHF  I  reviewed Consultant gastroenterology notes, hospitalist notes, last 24 h vitals and pain scores, last 48 h intake and output, last 24 h labs and trends, and last 24 h imaging results.    LOS: 4 days    Berna Bue, MD [facility] 11/04/2022, 9:10 AM Please see Amion for pager number during day hours 7:00am-4:30pm

## 2022-11-04 NOTE — Progress Notes (Signed)
[facility] Hospitalized Patient Note  [name] is a current hospice patient, start of care 8.29.24 with hospice diagnosos of COPD, followed at [facility] who was admitted to [facility] on 9.7.24 with primary diagnosis of small bowel obstruction. Per Dr. Gordy Savers, hospice physician, this is a related hospital admission.   Patient resting comfortably without signs of distress when visited. Easily aroused and alert though with garbled speech. Noted patient has continued bowel movements since starting suppositories. No change in plan for patient to discharge back to [facility] when medically ready.   Patient remains GIP appropriate due to need for IV  protonix and close monitoring as diet is advanced.  Vital Signs: 97.5/69/14      136/83    O2 100% on RA  I&O:  480/not documented  Abnormal labs: Calcium 8.6, Magnesium 1.6, HGB 9.4  Diagnostics: none new  IV/PRN Meds: protonix 40 meq IV BID  Problem List: Pseudoobstruction/ileus Initial CT scan of the abdomen/pelvis was concerning for a partial bowel obstruction with transition point in the terminal ileum.  General surgery is consulted with assessment more consistent with pseudoobstruction versus ileus.  NG tube was placed but dislodged and not reinserted.  Patient started on Dulcolax suppositories per general surgery.  Patient having bowel movements with Dulcolax suppositories. General surgery recommendations: Continue daily suppositories, mobilize   Coffee-ground emesis FOBT positive stool GI consulted and decision for conservative management secondary to goals of care. Hemoglobin stable.   Chronic anemia Baseline hemoglobin is around 9.  Hemoglobin of 11.1 g/dL on admission with drop to low of 8.6.  Complicated by concern for coffee-ground emesis.  Hemoglobin has remained stable without need for blood transfusion.   Hypokalemia Resolved with supplementation.   Hypomagnesemia Patient given  magnesium supplementation   Dementia -Delirium precautions   Discharge Planning: Ongoing, plan to return to [facility] upon Discharge with [facility], likely in 24 to 48 hours  Family Contact: Spoke with daughter by phone  IDT: Updated  Goals of Care: DNR  Should patient need ambulance transfer at discharge- please use [facility]) as they contract this service for our active hospice patients.  Glenna Fellows BSN, RN, [facility] Hospice hospital liaison [phone]

## 2022-11-05 DIAGNOSIS — K56600 Partial intestinal obstruction, unspecified as to cause: Secondary | ICD-10-CM | POA: Diagnosis not present

## 2022-11-05 LAB — BASIC METABOLIC PANEL
Anion gap: 13 (ref 5–15)
BUN: 9 mg/dL (ref 8–23)
CO2: 17 mmol/L — ABNORMAL LOW (ref 22–32)
Calcium: 8.6 mg/dL — ABNORMAL LOW (ref 8.9–10.3)
Chloride: 109 mmol/L (ref 98–111)
Creatinine, Ser: 0.63 mg/dL (ref 0.44–1.00)
GFR, Estimated: >60 mL/min (ref >60)
Glucose, Bld: 101 mg/dL — ABNORMAL HIGH (ref 70–99)
Potassium: 3.6 mmol/L (ref 3.5–5.1)
Sodium: 139 mmol/L (ref 135–145)

## 2022-11-05 LAB — CBC
HCT: 31.7 % — ABNORMAL LOW (ref 36.0–46.0)
Hemoglobin: 9.8 g/dL — ABNORMAL LOW (ref 12.0–15.0)
MCH: 31.3 pg (ref 26.0–34.0)
MCHC: 30.9 g/dL (ref 30.0–36.0)
MCV: 101.3 fL — ABNORMAL HIGH (ref 80.0–100.0)
Platelets: 131 10*3/uL — ABNORMAL LOW (ref 150–400)
RBC: 3.13 MIL/uL — ABNORMAL LOW (ref 3.87–5.11)
RDW: 14.6 % (ref 11.5–15.5)
WBC: 5 10*3/uL (ref 4.0–10.5)
nRBC: 0 % (ref 0.0–0.2)

## 2022-11-05 MED ORDER — POLYETHYLENE GLYCOL 3350 17 G PO PACK
17.0000 g | PACK | Freq: Every day | ORAL | Status: DC
  Administered 2022-11-05 – 2022-11-13 (×8): 17 g via ORAL
  Filled 2022-11-05 (×9): qty 1

## 2022-11-05 MED ORDER — METOCLOPRAMIDE HCL 5 MG/ML IJ SOLN
5.0000 mg | Freq: Four times a day (QID) | INTRAMUSCULAR | Status: DC
  Administered 2022-11-05 – 2022-11-13 (×30): 5 mg via INTRAVENOUS
  Filled 2022-11-05 (×32): qty 2

## 2022-11-05 NOTE — Progress Notes (Signed)
SLP Cancellation Note  Patient Details Name: Leslie Farley MRN: [id number] DOB: 21-Sep-1930   Cancelled treatment:       Reason Eval/Treat Not Completed: Other (comment) (RN reports pt moans and has distended abdomen, not ready for po advancement)   Chales Abrahams 11/05/2022, 3:58 PM  [name], MS [facility] SLP Acute Rehab Services Office [phone]

## 2022-11-05 NOTE — Progress Notes (Signed)
[facility] Patient Visit  [name] is a current hospice patient, start of care 8.29.24 with hospice diagnosos of COPD, followed at [facility] who was admitted to [facility] on 9.[facility].24 with primary diagnosis of small bowel obstruction. Per Dr. Gordy Savers, hospice physician, this is a related hospital admission.   Patient appears to be sleeping comfortably without signs of distress when visited. Hospitalist notes continued bowel movements and plan to discharge in 1 to 2 days. No change in plan for patient to discharge back to [facility] when medically ready.  Patient remains GIP appropriate due to need for IV protonix, IV Reglan, and close monitoring as diet is advanced.  Vital Signs: not documented today  I&O:  120/not documented  Abnormal labs: CO2 17, HGB 9.8, Plat 131  Diagnostics: none new  IV/PRN Meds: protonix 40 meq IV BID, Reglan 5mg  IV every 6 hours  Problem List:  Pseudoobstruction/ileus Initial CT scan of the abdomen/pelvis was concerning for a partial bowel obstruction with transition point in the terminal ileum.  General surgery is consulted with assessment more consistent with pseudoobstruction versus ileus.  NG tube was placed but dislodged and not reinserted.  Patient started on Dulcolax suppositories per general surgery.  Patient having bowel movements with Dulcolax suppositories. General surgery recommendations: Continue daily suppositories, mobilize; pending today   Coffee-ground emesis FOBT positive stool GI consulted and decision for conservative management secondary to goals of care. Hemoglobin stable.   Chronic anemia Baseline hemoglobin is around 9.  Hemoglobin of 11.1 g/dL on admission with drop to low of 8.6.  Complicated by concern for coffee-ground emesis.  Hemoglobin has remained stable without need for blood transfusion.   Hypokalemia Resolved with supplementation.   Hypomagnesemia Patient given  magnesium supplementation   Dementia -Delirium precautions   Chronic diastolic heart failure Stable.   Ambulatory dysfunction Bedbound.   Discharge Planning: Ongoing, plan to return to [facility] upon Discharge with [facility], likely in 24 to 48 hours  Family Contact: Communicated with daughter by phone IDT: Updated  Goals of Care: DNR  Should patient need ambulance transfer at discharge- please use [facility]) as they contract this service for our active hospice patients.  Glenna Fellows BSN, RN, [facility] Hospice hospital liaison [phone]

## 2022-11-05 NOTE — Progress Notes (Addendum)
Patient ID: Leslie Farley, female   DOB: 10/15/1930, 89 y.o.   MRN: [id number] [facility] Progress Note     Subjective: CC-  No complaints. Patient had a tiny BM last night. Tolerating some clear liquids. No n/v. Patient refused several medications yesterday.  Objective: Vital signs in last 24 hours: Temp:  [97.5 F (36.4 C)-97.7 F (36.5 C)] 97.7 F (36.5 C) (09/11 2155) Pulse Rate:  [67-69] 67 (09/11 2155) Resp:  [14-19] 19 (09/11 2155) BP: (136-137)/(72-83) 137/72 (09/11 2155) SpO2:  [97 %-100 %] 98 % (09/12 0734) FiO2 (%):  [21 %] 21 % (09/12 0734) Weight:  [86.2 kg] 86.2 kg (09/12 0500) Last BM Date : 11/03/22  Intake/Output from previous day: 09/11 0701 - 09/12 0700 In: 720 [P.O.:720] Out: -  Intake/Output this shift: No intake/output data recorded.  PE: Gen: Alert, NAD Pulm:  rate and effort normal Abd: distended, somewhat firm, nontender, hyperactive bowel sounds  Lab Results:  Recent Labs    11/04/22 0501 11/05/22 0501  WBC 5.3 5.0  HGB 9.4* 9.8*  HCT 30.3* 31.7*  PLT 248 131*   BMET Recent Labs    11/04/22 0501 11/05/22 0501  NA 139 139  K 3.6 3.6  CL 105 109  CO2 24 17*  GLUCOSE 90 101*  BUN 11 9  CREATININE 0.59 0.63  CALCIUM 8.6* 8.6*   PT/INR No results for input(s): "LABPROT", "INR" in the last 72 hours. CMP     Component Value Date/Time   NA 139 11/05/2022 0501   NA 138 06/03/2016 0000   K 3.6 11/05/2022 0501   CL 109 11/05/2022 0501   CO2 17 (L) 11/05/2022 0501   GLUCOSE 101 (H) 11/05/2022 0501   BUN 9 11/05/2022 0501   BUN 15 06/03/2016 0000   CREATININE 0.63 11/05/2022 0501   CALCIUM 8.6 (L) 11/05/2022 0501   PROT 6.5 11/01/2022 0435   ALBUMIN 3.1 (L) 11/01/2022 0435   AST 42 (H) 11/01/2022 0435   ALT 41 11/01/2022 0435   ALKPHOS 57 11/01/2022 0435   BILITOT 0.6 11/01/2022 0435   GFRNONAA >60 11/05/2022 0501   GFRAA >60 10/31/2014 0515   Lipase     Component Value Date/Time   LIPASE 32 09/15/2022  1600       Studies/Results: No results found.  Anti-infectives: Anti-infectives (From admission, onward)    None        Assessment/Plan 89 y/o F w/ multiple comorbidities and hx of cholecystectomy who presents with coffee ground emesis and CT evidence of ileus/pseudo-obstruction -Abdomen remains distended but she is nontender this morning and is tolerating a diet without vomiting. Small BM yesterday. Continue daily suppositories, bowel regimen. Mobilize as able. Trial reglan.   ID - none FEN - CLD (working with SLP, ok for FLD if she passes) VTE - ok for chemical dvt ppx from surgical standpoint Foley - none   GIB - h/h stable. GI has seen Advanced dementia, FTT on hospice care HTN CHF  I reviewed last 24 h vitals and pain scores, last 48 h intake and output, and last 24 h labs and trends.    LOS: 5 days    Franne Forts, [facility] Surgery 11/05/2022, 8:55 AM Please see Amion for pager number during day hours 7:00am-4:30pm

## 2022-11-05 NOTE — TOC Progression Note (Signed)
Transition of Care [facility]) - Progression Note   Patient Details  Name: Leslie Farley MRN: [id number] Date of Birth: 10-30-1930  Transition of Care [facility]) CM/SW Contact  Ewing Schlein, LCSW Phone Number: 11/05/2022, 10:58 AM  Clinical Narrative: Patient is expected to discharge back to [facility] in 1-2 days. CSW updated Nikki in admissions at [facility].  Expected Discharge Plan: Long Term Nursing Home ([facility] with [facility] hosp[facility].) Barriers to Discharge: Continued Medical Work up  Expected Discharge Plan and Services In-house Referral: Clinical Social Work Post Acute Care Choice: Nursing Home, Hospice Living arrangements for the past 2 months: Skilled Nursing Facility           DME Arranged: N/A DME Agency: NA  Social Determinants of Health (SDOH) Interventions SDOH Screenings   Food Insecurity: Patient Unable To Answer (11/02/2022)  Housing: Patient Unable To Answer (11/02/2022)  Transportation Needs: Patient Unable To Answer (11/02/2022)  Utilities: Patient Unable To Answer (11/02/2022)  Tobacco Use: Medium Risk (11/03/2022)   Readmission Risk Interventions    11/02/2022    9:28 AM 03/31/2022    1:08 PM  Readmission Risk Prevention Plan  Post Dischage Appt  Complete  Medication Screening  Complete  Transportation Screening Complete Complete  Medication Review (RN Care Manager) Complete   HRI or Home Care Consult Complete   SW Recovery Care/Counseling Consult Complete   Palliative Care Screening Complete   Skilled Nursing Facility Complete

## 2022-11-05 NOTE — Progress Notes (Signed)
PROGRESS NOTE    Leslie Farley  ZOX:[id number] DOB: May 21, 1930 DOA: 10/30/2022 PCP: Oneita Hurt, No   Brief Narrative: Leslie Farley is a 89 y.o. female with a history of diastolic heart failure, hypertension, dementia currently on hospice.  Patient presented secondary to vomiting with coffee-ground emesis in addition to a severely distended abdomen.  On admission, patient was found to have evidence of a partial small obstruction with transition point in the terminal ileum.  Gastroenterology was consulted for coffee-ground emesis with decision made for conservative management secondary to goals of care.  General surgery was consulted for obstruction within assessment more consistent with likely pseudoobstruction/ileus.  Patient managed with suppositories with development of bowel movements.    Assessment and Plan:  Pseudoobstruction/ileus Initial CT scan of the abdomen/pelvis was concerning for a partial bowel obstruction with transition point in the terminal ileum.  General surgery is consulted with assessment more consistent with pseudoobstruction versus ileus.  NG tube was placed but dislodged and not reinserted.  Patient started on Dulcolax suppositories per general surgery.  Patient having bowel movements with Dulcolax suppositories. General surgery recommendations: Continue daily suppositories, mobilize; pending today  Coffee-ground emesis FOBT positive stool GI consulted and decision for conservative management secondary to goals of care. Hemoglobin stable.  Chronic anemia Baseline hemoglobin is around 9.  Hemoglobin of 11.1 g/dL on admission with drop to low of 8.6.  Complicated by concern for coffee-ground emesis.  Hemoglobin has remained stable without need for blood transfusion.  Hypokalemia Resolved with supplementation.  Hypomagnesemia Patient given magnesium supplementation  Dementia -Delirium precautions  Chronic diastolic heart failure Stable.  Ambulatory  dysfunction Bedbound.  Primary hypertension -Continue clonidine  Obesity Estimated body mass index is 31.62 kg/m as calculated from the following:   Height as of this encounter: 5\' 5"  (1.651 m).   Weight as of this encounter: 86.2 kg.   DVT prophylaxis: SCDs Start: 10/31/22 0349 Code Status:   Code Status: Limited: Do not attempt resuscitation (DNR) -DNR-LIMITED -Do Not Intubate/DNI  Family Communication: None at bedside Disposition Plan: Discharge back to SNF with hospice, likely discharge in 24 to 48 hours pending ongoing surgery recommendations and improvement of symptoms   Consultants:  General surgery Palliative care medicine Gastroenterology  Procedures:  None  Antimicrobials: None    Subjective: No concerns from patient today. No abdominal pain.  Objective: BP 137/72 (BP Location: Left Arm)   Pulse 67   Temp 97.7 F (36.5 C) (Oral)   Resp 19   Ht 5\' 5"  (1.651 m)   Wt 86.2 kg   SpO2 98%   BMI 31.62 kg/m   Examination:  General exam: Appears calm and comfortable Respiratory system: Clear to auscultation. Respiratory effort normal. Cardiovascular system: S1 & S2 heard, RRR. No murmurs, rubs, gallops or clicks. Gastrointestinal system: Abdomen is distended, soft and non-tender. Decreased bowel sounds heard. Central nervous system: Alert and oriented. No focal neurological deficits. Musculoskeletal: No edema. No calf tenderness Skin: No cyanosis. No rashes Psychiatry: Judgement and insight appear normal. Mood & affect appropriate.    Data Reviewed: I have personally reviewed following labs and imaging studies  CBC Lab Results  Component Value Date   WBC 5.0 11/05/2022   RBC 3.13 (L) 11/05/2022   HGB 9.8 (L) 11/05/2022   HCT 31.7 (L) 11/05/2022   MCV 101.3 (H) 11/05/2022   MCH 31.3 11/05/2022   PLT 131 (L) 11/05/2022   MCHC 30.9 11/05/2022   RDW 14.6 11/05/2022   LYMPHSABS 1.3 10/31/2022   MONOABS  0.5 10/31/2022   EOSABS 0.0 10/31/2022    BASOSABS 0.0 10/31/2022     Last metabolic panel Lab Results  Component Value Date   NA 139 11/05/2022   K 3.6 11/05/2022   CL 109 11/05/2022   CO2 17 (L) 11/05/2022   BUN 9 11/05/2022   CREATININE 0.63 11/05/2022   GLUCOSE 101 (H) 11/05/2022   GFRNONAA >60 11/05/2022   GFRAA >60 10/31/2014   CALCIUM 8.6 (L) 11/05/2022   PHOS 2.7 11/01/2022   PROT 6.5 11/01/2022   ALBUMIN 3.1 (L) 11/01/2022   BILITOT 0.6 11/01/2022   ALKPHOS 57 11/01/2022   AST 42 (H) 11/01/2022   ALT 41 11/01/2022   ANIONGAP 13 11/05/2022    GFR: Estimated Creatinine Clearance: 51.7 mL/min (by C-G formula based on SCr of 0.63 mg/dL).  No results found for this or any previous visit (from the past 240 hour(s)).    Radiology Studies: No results found.    LOS: 5 days    Jacquelin Hawking, MD [facility] 11/05/2022, 8:28 AM   If 7PM-7AM, please contact night-coverage [email or web address]

## 2022-11-06 DIAGNOSIS — K56600 Partial intestinal obstruction, unspecified as to cause: Secondary | ICD-10-CM | POA: Diagnosis not present

## 2022-11-06 LAB — MAGNESIUM: Magnesium: 2 mg/dL (ref 1.7–2.4)

## 2022-11-06 LAB — CBC
HCT: 29.8 % — ABNORMAL LOW (ref 36.0–46.0)
Hemoglobin: 9.2 g/dL — ABNORMAL LOW (ref 12.0–15.0)
MCH: 30.5 pg (ref 26.0–34.0)
MCHC: 30.9 g/dL (ref 30.0–36.0)
MCV: 98.7 fL (ref 80.0–100.0)
Platelets: 245 10*3/uL (ref 150–400)
RBC: 3.02 MIL/uL — ABNORMAL LOW (ref 3.87–5.11)
RDW: 14.3 % (ref 11.5–15.5)
WBC: 4.3 10*3/uL (ref 4.0–10.5)
nRBC: 0 % (ref 0.0–0.2)

## 2022-11-06 LAB — BASIC METABOLIC PANEL
Anion gap: 8 (ref 5–15)
BUN: 8 mg/dL (ref 8–23)
CO2: 25 mmol/L (ref 22–32)
Calcium: 8.6 mg/dL — ABNORMAL LOW (ref 8.9–10.3)
Chloride: 104 mmol/L (ref 98–111)
Creatinine, Ser: 0.5 mg/dL (ref 0.44–1.00)
GFR, Estimated: >60 mL/min (ref >60)
Glucose, Bld: 93 mg/dL (ref 70–99)
Potassium: 2.5 mmol/L — CL (ref 3.5–5.1)
Sodium: 137 mmol/L (ref 135–145)

## 2022-11-06 LAB — POTASSIUM: Potassium: 2.8 mmol/L — ABNORMAL LOW (ref 3.5–5.1)

## 2022-11-06 MED ORDER — POTASSIUM CHLORIDE 10 MEQ/100ML IV SOLN
10.0000 meq | INTRAVENOUS | Status: DC
  Administered 2022-11-06: 10 meq via INTRAVENOUS
  Filled 2022-11-06 (×2): qty 100

## 2022-11-06 MED ORDER — POTASSIUM CHLORIDE 10 MEQ/100ML IV SOLN
10.0000 meq | INTRAVENOUS | Status: AC
  Administered 2022-11-06 (×6): 10 meq via INTRAVENOUS
  Filled 2022-11-06 (×6): qty 100

## 2022-11-06 MED ORDER — POTASSIUM CHLORIDE CRYS ER 20 MEQ PO TBCR
40.0000 meq | EXTENDED_RELEASE_TABLET | Freq: Two times a day (BID) | ORAL | Status: DC
  Administered 2022-11-06: 40 meq via ORAL
  Filled 2022-11-06: qty 2

## 2022-11-06 MED ORDER — POTASSIUM CHLORIDE 10 MEQ/100ML IV SOLN
10.0000 meq | INTRAVENOUS | Status: AC
  Administered 2022-11-06 (×3): 10 meq via INTRAVENOUS
  Filled 2022-11-06 (×3): qty 100

## 2022-11-06 NOTE — Progress Notes (Signed)
Patient ID: Leslie Farley, female   DOB: 11/04/1930, 89 y.o.   MRN: [id number] [facility] Progress Note     Subjective: CC-  Patient awake and alert this morning. Appears comfortable. Only says "no no no." 1 bm over night. She has taken in some liquids without emesis.  Objective: Vital signs in last 24 hours: Temp:  [97.9 F (36.6 C)-98.6 F (37 C)] 98 F (36.7 C) (09/13 0424) Pulse Rate:  [66-69] 66 (09/13 0424) Resp:  [14-18] 18 (09/13 0424) BP: (160-177)/(59-76) 177/62 (09/13 0424) SpO2:  [97 %-100 %] 100 % (09/13 0424) Weight:  [87.3 kg] 87.3 kg (09/13 0424) Last BM Date : 11/03/22  Intake/Output from previous day: 09/12 0701 - 09/13 0700 In: 220 [P.O.:220] Out: 375 [Urine:375] Intake/Output this shift: No intake/output data recorded.  PE: Gen:  Alert, NAD, comfortable Pulm: rate and effort normal on room air Abd: distended but soft, difficult to tell if she is tender or just does not want to be examined  Lab Results:  Recent Labs    11/05/22 0501 11/06/22 0456  WBC 5.0 4.3  HGB 9.8* 9.2*  HCT 31.7* 29.8*  PLT 131* 245   BMET Recent Labs    11/05/22 0501 11/06/22 0456  NA 139 137  K 3.6 2.5*  CL 109 104  CO2 17* 25  GLUCOSE 101* 93  BUN 9 8  CREATININE 0.63 0.50  CALCIUM 8.6* 8.6*   PT/INR No results for input(s): "LABPROT", "INR" in the last 72 hours. CMP     Component Value Date/Time   NA 137 11/06/2022 0456   NA 138 06/03/2016 0000   K 2.5 (LL) 11/06/2022 0456   CL 104 11/06/2022 0456   CO2 25 11/06/2022 0456   GLUCOSE 93 11/06/2022 0456   BUN 8 11/06/2022 0456   BUN 15 06/03/2016 0000   CREATININE 0.50 11/06/2022 0456   CALCIUM 8.6 (L) 11/06/2022 0456   PROT 6.5 11/01/2022 0435   ALBUMIN 3.1 (L) 11/01/2022 0435   AST 42 (H) 11/01/2022 0435   ALT 41 11/01/2022 0435   ALKPHOS 57 11/01/2022 0435   BILITOT 0.6 11/01/2022 0435   GFRNONAA >60 11/06/2022 0456   GFRAA >60 10/31/2014 0515   Lipase     Component Value  Date/Time   LIPASE 32 09/15/2022 1600       Studies/Results: No results found.  Anti-infectives: Anti-infectives (From admission, onward)    None        Assessment/Plan 89 y/o F w/ multiple comorbidities and hx of cholecystectomy who presents with coffee ground emesis and CT evidence of ileus/pseudo-obstruction -Abdomen remains distended but she is having some bowel function and tolerating some liquids without emesis since NG removal 9/9. Continue daily suppositories, bowel regimen. Mobilize as able. Continue reglan (this was started 9/12). No role for surgery.   ID - none FEN - CLD (ok to advance to FLD if she passes with SLP) VTE - ok for chemical dvt ppx from surgical standpoint Foley - none   GIB - h/h stable. GI has seen Advanced dementia, FTT on hospice care HTN CHF  I reviewed hospitalist notes, last 24 h vitals and pain scores, and last 48 h intake and output.    LOS: 6 days    Franne Forts, [facility] 11/06/2022, 9:38 AM Please see Amion for pager number during day hours 7:00am-4:30pm

## 2022-11-06 NOTE — Progress Notes (Signed)
PROGRESS NOTE    Leslie Farley  GEX:[id number] DOB: Mar 03, 1930 DOA: 10/30/2022 PCP: Oneita Hurt, No   Brief Narrative: Leslie Farley is a 89 y.o. female with a history of diastolic heart failure, hypertension, dementia currently on hospice.  Patient presented secondary to vomiting with coffee-ground emesis in addition to a severely distended abdomen.  On admission, patient was found to have evidence of a partial small obstruction with transition point in the terminal ileum.  Gastroenterology was consulted for coffee-ground emesis with decision made for conservative management secondary to goals of care.  General surgery was consulted for obstruction within assessment more consistent with likely pseudoobstruction/ileus.  Patient managed with suppositories with development of bowel movements.    Assessment and Plan:  Pseudoobstruction/ileus Initial CT scan of the abdomen/pelvis was concerning for a partial bowel obstruction with transition point in the terminal ileum.  General surgery is consulted with assessment more consistent with pseudoobstruction versus ileus.  NG tube was placed but dislodged and not reinserted.  Patient started on Dulcolax suppositories per general surgery.  Patient having bowel movements with Dulcolax suppositories. General surgery recommendations: Continue daily suppositories, mobilize, Reglan; signed off  Coffee-ground emesis FOBT positive stool GI consulted and decision for conservative management secondary to goals of care. Hemoglobin stable.  Chronic anemia Baseline hemoglobin is around 9.  Hemoglobin of 11.1 g/dL on admission with drop to low of 8.6.  Complicated by concern for coffee-ground emesis.  Hemoglobin has remained stable without need for blood transfusion.  Hypokalemia Recurrent. -Potassium supplementation  Hypomagnesemia Patient given magnesium supplementation  Dementia -Delirium precautions  Chronic diastolic heart  failure Stable.  Ambulatory dysfunction Bedbound.  Primary hypertension -Continue clonidine  Obesity Estimated body mass index is 32.03 kg/m as calculated from the following:   Height as of this encounter: 5\' 5"  (1.651 m).   Weight as of this encounter: 87.3 kg.   DVT prophylaxis: SCDs Start: 10/31/22 0349 Code Status:   Code Status: Limited: Do not attempt resuscitation (DNR) -DNR-LIMITED -Do Not Intubate/DNI  Family Communication: None at bedside Disposition Plan: Discharge back to SNF with hospice hopefully in 24 hours once electrolyte issues addressed   Consultants:  General surgery Palliative care medicine Gastroenterology  Procedures:  None  Antimicrobials: None    Subjective: No concerns today. Per nursing, patient is refusing medication.  Objective: BP (!) 168/71 (BP Location: Right Arm)   Pulse 71   Temp 98 F (36.7 C) (Axillary)   Resp 18   Ht 5\' 5"  (1.651 m)   Wt 87.3 kg   SpO2 99%   BMI 32.03 kg/m   Examination:  General exam: Appears calm and comfortable Respiratory system: Clear to auscultation. Respiratory effort normal. Cardiovascular system: S1 & S2 heard, RRR. Gastrointestinal system: Abdomen is very distended, soft and nontender. Large ventral hernia. Central nervous system: Alert. Musculoskeletal: No edema. No calf tenderness   Data Reviewed: I have personally reviewed following labs and imaging studies  CBC Lab Results  Component Value Date   WBC 4.3 11/06/2022   RBC 3.02 (L) 11/06/2022   HGB 9.2 (L) 11/06/2022   HCT 29.8 (L) 11/06/2022   MCV 98.7 11/06/2022   MCH 30.5 11/06/2022   PLT 245 11/06/2022   MCHC 30.9 11/06/2022   RDW 14.3 11/06/2022   LYMPHSABS 1.3 10/31/2022   MONOABS 0.5 10/31/2022   EOSABS 0.0 10/31/2022   BASOSABS 0.0 10/31/2022     Last metabolic panel Lab Results  Component Value Date   NA 137 11/06/2022   K 2.5 (  LL) 11/06/2022   CL 104 11/06/2022   CO2 25 11/06/2022   BUN 8 11/06/2022    CREATININE 0.50 11/06/2022   GLUCOSE 93 11/06/2022   GFRNONAA >60 11/06/2022   GFRAA >60 10/31/2014   CALCIUM 8.6 (L) 11/06/2022   PHOS 2.7 11/01/2022   PROT 6.5 11/01/2022   ALBUMIN 3.1 (L) 11/01/2022   BILITOT 0.6 11/01/2022   ALKPHOS 57 11/01/2022   AST 42 (H) 11/01/2022   ALT 41 11/01/2022   ANIONGAP 8 11/06/2022    GFR: Estimated Creatinine Clearance: 52 mL/min (by C-G formula based on SCr of 0.5 mg/dL).  No results found for this or any previous visit (from the past 240 hour(s)).    Radiology Studies: No results found.    LOS: 6 days    Jacquelin Hawking, MD [facility] 11/06/2022, 2:54 PM   If 7PM-7AM, please contact night-coverage [email or web address]

## 2022-11-06 NOTE — Progress Notes (Signed)
Patient ID: Leslie Farley, female   DOB: January 12, 1931, 89 y.o.   MRN: [id number] [facility] Surgery Progress Note     Subjective: CC-  No acute events. One BM recorded.   Objective: Vital signs in last 24 hours: Temp:  [97.9 F (36.6 C)-98.6 F (37 C)] 98 F (36.7 C) (09/13 0424) Pulse Rate:  [66-69] 66 (09/13 0424) Resp:  [14-18] 18 (09/13 0424) BP: (160-177)/(59-76) 177/62 (09/13 0424) SpO2:  [97 %-100 %] 100 % (09/13 0424) Weight:  [87.3 kg] 87.3 kg (09/13 0424) Last BM Date : 11/03/22  Intake/Output from previous day: 09/12 0701 - 09/13 0700 In: 220 [P.O.:220] Out: 375 [Urine:375] Intake/Output this shift: No intake/output data recorded.  PE: Gen: Alert, NAD Pulm:  rate and effort normal Abd: distended, stable  Lab Results:  Recent Labs    11/05/22 0501 11/06/22 0456  WBC 5.0 4.3  HGB 9.8* 9.2*  HCT 31.7* 29.8*  PLT 131* 245   BMET Recent Labs    11/05/22 0501 11/06/22 0456  NA 139 137  K 3.6 2.5*  CL 109 104  CO2 17* 25  GLUCOSE 101* 93  BUN 9 8  CREATININE 0.63 0.50  CALCIUM 8.6* 8.6*   PT/INR No results for input(s): "LABPROT", "INR" in the last 72 hours. CMP     Component Value Date/Time   NA 137 11/06/2022 0456   NA 138 06/03/2016 0000   K 2.5 (LL) 11/06/2022 0456   CL 104 11/06/2022 0456   CO2 25 11/06/2022 0456   GLUCOSE 93 11/06/2022 0456   BUN 8 11/06/2022 0456   BUN 15 06/03/2016 0000   CREATININE 0.50 11/06/2022 0456   CALCIUM 8.6 (L) 11/06/2022 0456   PROT 6.5 11/01/2022 0435   ALBUMIN 3.1 (L) 11/01/2022 0435   AST 42 (H) 11/01/2022 0435   ALT 41 11/01/2022 0435   ALKPHOS 57 11/01/2022 0435   BILITOT 0.6 11/01/2022 0435   GFRNONAA >60 11/06/2022 0456   GFRAA >60 10/31/2014 0515   Lipase     Component Value Date/Time   LIPASE 32 09/15/2022 1600       Studies/Results: No results found.  Anti-infectives: Anti-infectives (From admission, onward)    None        Assessment/Plan 89 y/o F w/ multiple  comorbidities and hx of cholecystectomy who presents with coffee ground emesis and CT evidence of ileus/pseudo-obstruction -Abdomen remains distended but she is nontender this morning and is tolerating a diet without vomiting. One BM yesterday. Continue daily suppositories, bowel regimen. Seems to be working. Mobilize as able. Trial reglan.  -No plans for surgical intervention at this point. Surgery team will sign off- please call if questions or concerns.    ID - none FEN - CLD (working with SLP, ok for FLD if she passes). Hypolalemia- replacements ordered VTE - ok for chemical dvt ppx from surgical standpoint Foley - none   GIB - h/h stable. GI has seen Advanced dementia, FTT on hospice care HTN CHF  I reviewed last 24 h vitals and pain scores, last 48 h intake and output, and last 24 h labs and trends.    LOS: 6 days    Berna Bue, MD [facility] Surgery 11/06/2022, 9:39 AM Please see Amion for pager number during day hours 7:00am-4:30pm

## 2022-11-06 NOTE — TOC Progression Note (Signed)
Transition of Care [facility]) - Progression Note   Patient Details  Name: Leslie Farley MRN: [id number] Date of Birth: 05-20-1930  Transition of Care [facility]) CM/SW Contact  Ewing Schlein, LCSW Phone Number: 11/06/2022, 1:52 PM  Clinical Narrative: Surgery has signed off at this time, but patient's electrolytes need to improve prior to discharge. CSW notified Lowella Bandy in admissions at [facility] and [place] with [facility] regarding a possible weekend return to LTC at the facility. CSW updated daughter. Patient will need to transport back to [facility] via [facility].  Expected Discharge Plan: Long Term Nursing Home ([place] [facility] with [facility] hospice.) Barriers to Discharge: Continued Medical Work up  Expected Discharge Plan and Services In-house Referral: Clinical Social Work   Post Acute Care Choice: Nursing Home, Hospice Living arrangements for the past 2 months: Skilled Nursing Facility                 DME Arranged: N/A DME Agency: NA                   Social Determinants of Health (SDOH) Interventions SDOH Screenings   Food Insecurity: Patient Unable To Answer (11/02/2022)  Housing: Patient Unable To Answer (11/02/2022)  Transportation Needs: Patient Unable To Answer (11/02/2022)  Utilities: Patient Unable To Answer (11/02/2022)  Tobacco Use: Medium Risk (11/03/2022)    Readmission Risk Interventions    11/02/2022    9:28 AM 03/31/2022    1:08 PM  Readmission Risk Prevention Plan  Post Dischage Appt  Complete  Medication Screening  Complete  Transportation Screening Complete Complete  Medication Review (RN Care Manager) Complete   HRI or Home Care Consult Complete   SW Recovery Care/Counseling Consult Complete   Palliative Care Screening Complete   Skilled Nursing Facility Complete

## 2022-11-06 NOTE — Progress Notes (Signed)
[facility] Patient Visit  [name] is a current hospice patient, start of care 8.29.24 with hospice diagnosos of COPD, followed at [facility] who was admitted to [facility] on 9.7.24 with primary diagnosis of small bowel obstruction. Per Dr. Gordy Savers, hospice physician, this is a related hospital admission.   Patient sitting up in bed when visited. Pleasant and eating/drinking well with assistance. Responding with one word verbal responses. No change in plan for patient to discharge back to [facility] when medically ready.  Patient remains GIP appropriate due to need for IV protonix, IV Reglan, IV electrolyte replacement, and close monitoring as diet is advanced.  Vital Signs: 98/71/18     168/71    O2 99 % on RA  I&O:  220/375  Abnormal labs: KCL 2.5, Calcium 8.6, HGB 9.2, HCT 29.8  Diagnostics: none new  IV/PRN Meds: protonix 40 meq IV BID, Reglan 5mg  IV every 6 hours, hydralazine 5mg  IV x 1, Potassium 40 meq po bid, Potassium IV x 2 so far.  Problem List:   Pseudoobstruction/ileus Initial CT scan of the abdomen/pelvis was concerning for a partial bowel obstruction with transition point in the terminal ileum.  General surgery is consulted with assessment more consistent with pseudoobstruction versus ileus.  NG tube was placed but dislodged and not reinserted.  Patient started on Dulcolax suppositories per general surgery.  Patient having bowel movements with Dulcolax suppositories. General surgery recommendations: Continue daily suppositories, mobilize, Reglan; signed off   Coffee-ground emesis FOBT positive stool GI consulted and decision for conservative management secondary to goals of care. Hemoglobin stable.   Chronic anemia Baseline hemoglobin is around 9.  Hemoglobin of 11.1 g/dL on admission with drop to low of 8.6.  Complicated by concern for coffee-ground emesis.  Hemoglobin has remained stable without need for  blood transfusion.   Hypokalemia Recurrent. -Potassium supplementation   Hypomagnesemia Patient given magnesium supplementation   Dementia -Delirium precautions   Chronic diastolic heart failure Stable.   Ambulatory dysfunction Bedbound.   Primary hypertension -Continue clonidine  Discharge Planning: Ongoing, plan to return to [facility] upon Discharge with [facility], likely tomorrow.  Family Contact: Communicated with daughter by phone  IDT: Updated  Goals of Care: DNR  Should patient need ambulance transfer at discharge- please use [facility]) as they contract this service for our active hospice patients.  Glenna Fellows BSN, RN, [facility] Hospice hospital liaison [phone]

## 2022-11-06 NOTE — Progress Notes (Signed)
Speech Language Pathology Treatment: Dysphagia  Patient Details Name: Leslie Farley MRN: [id number] DOB: 01/24/1931 Today's Date: 11/06/2022 Time: 4098-1191 SLP Time Calculation (min) (ACUTE ONLY): 22 min  Assessment / Plan / Recommendation Clinical Impression  Pt seen for dysphagia f/u tx session. Pt consumed with mod verbal/tactile cues thin liquids via cup/tsp and puree consistency with small bites/sips without overt s/sx of aspiration noted until increased volume given with immediate cough observed.  Pt with prolonged oral manipulation of puree and multiple swallows observed after all consistencies.  Pt with strong cough response when volume increased, but she would benefit from FULL supervision and mod-max verbal/visual cues during po intake required.  Due to medical dx, pt should continue current diet of thin liquids with FULL supervision/precautions in place.  ST will f/u for diet progression/tolerance during acute stay.    HPI HPI: Patient is an 89 y.o. [place] speaking female with PMH: dementia, HTN, COPD, asthma, anemia, GERD, arthritis. Admitted Swallow evaluation ordered after patient admitted 10/31/2022 status post NG tube, which patient removed overnight 11/02/2022. Imaging shows concern for ileus. Recently been admitted to the hospital in 09/2022 and 08/2022. She had acute hypoxic respiratory failure with abdominal pain secondary to colonic ileus. Patient had been eating pureed foods from notes on prior evaluation - and was not wanting to wear her dentures. Swallow evaluation ordered and rec'd thin liquids d/t SBO; ST f/u for diet check/tolerance.      SLP Plan  Continue with current plan of care      Recommendations for follow up therapy are one component of a multi-disciplinary discharge planning process, led by the attending physician.  Recommendations may be updated based on patient status, additional functional criteria and insurance authorization.    Recommendations   Diet recommendations: Thin liquid Liquids provided via: Teaspoon;Cup;No straw Medication Administration: Crushed with puree Supervision: Full supervision/cueing for compensatory strategies;Trained caregiver to feed patient Compensations: Slow rate;Small sips/bites;Minimize environmental distractions;Multiple dry swallows after each bite/sip Postural Changes and/or Swallow Maneuvers: Seated upright 90 degrees                  Oral care QID;Staff/trained caregiver to provide oral care     Dysphagia, oropharyngeal phase (R13.12)     Continue with current plan of care     Pat Kaylor Simenson,M.S.,CCC-SLP  11/06/2022, 12:52 PM

## 2022-11-07 DIAGNOSIS — K56600 Partial intestinal obstruction, unspecified as to cause: Secondary | ICD-10-CM | POA: Diagnosis not present

## 2022-11-07 LAB — MAGNESIUM: Magnesium: 1.8 mg/dL (ref 1.7–2.4)

## 2022-11-07 LAB — POTASSIUM: Potassium: 2.8 mmol/L — ABNORMAL LOW (ref 3.5–5.1)

## 2022-11-07 MED ORDER — POTASSIUM CHLORIDE CRYS ER 20 MEQ PO TBCR
40.0000 meq | EXTENDED_RELEASE_TABLET | ORAL | Status: AC
  Administered 2022-11-07 (×2): 40 meq via ORAL
  Filled 2022-11-07 (×2): qty 2

## 2022-11-07 MED ORDER — MAGNESIUM SULFATE 2 GM/50ML IV SOLN
2.0000 g | Freq: Once | INTRAVENOUS | Status: AC
  Administered 2022-11-07: 2 g via INTRAVENOUS
  Filled 2022-11-07: qty 50

## 2022-11-07 NOTE — Progress Notes (Signed)
[facility] Patient Visit   [name] is a current hospice patient, start of care 8.29.24 with hospice diagnosos of COPD, followed at [facility] who was admitted to [facility] on 9.7.24 with primary diagnosis of small bowel obstruction. Per Dr. Gordy Savers, hospice physician, this is a related hospital admission.    Patient asleep. No visitors at the bedside. No change in plan for patient to discharge back to [facility] when medically ready. Per MD, her diet needs to be advanced if able, and they need to replace her electrolytes prior to discharge. They anticipate she will discharge tomorrow.   Patient remains GIP appropriate due to need for IV protonix, IV Reglan, IV electrolyte replacement, and close monitoring as diet is advanced.       V/S: 76, 16, 143/65, 99% on RA   I/O:  6,470 ml intake/ 1,075 output   Abnormal Labs:  Potassium 3.5 - 5.1 mmol/L 2.8 (L)    Diagnostics: none since 9.8    IV/PRN: IV reglan 5 mg every 6 hours, IV protonix 40 mg 2 times daily, IV hydralazine 5 mg PRN every 6 hours,    Problem List: Pseudoobstruction/ileus Initial CT scan of the abdomen/pelvis was concerning for a partial bowel obstruction with transition point in the terminal ileum.  General surgery is consulted with assessment more consistent with pseudoobstruction versus ileus.  NG tube was placed but dislodged and not reinserted.  Patient started on Dulcolax suppositories per general surgery.  Patient having bowel movements with Dulcolax suppositories. General surgery recommendations: Continue daily suppositories, mobilize, Reglan; signed off -Full liquid diet   Coffee-ground emesis FOBT positive stool GI consulted and decision for conservative management secondary to goals of care. Hemoglobin stable.   Chronic anemia Baseline hemoglobin is around 9.  Hemoglobin of 11.1 g/dL on admission with drop to low of 8.6.  Complicated by concern for  coffee-ground emesis.  Hemoglobin has remained stable without need for blood transfusion.   Hypokalemia Recurrent and persistent. Potassium only up to 2.8. Complicated by patient non-adherence to oral supplementation and loss of IV for IV supplementation -Continue potassium supplementation   Hypomagnesemia Patient given magnesium supplementation. Magnesium of 1.8 today -Magnesium supplementation   Dementia -Delirium precautions   Chronic diastolic heart failure Stable.   Dysphagia -SLP recommendations (9/13): Diet recommendations: Thin liquid Liquids provided via: Teaspoon;Cup;No straw Medication Administration: Crushed with puree Supervision: Full supervision/cueing for compensatory strategies;Trained caregiver to feed patient Compensations: Slow rate;Small sips/bites;Minimize environmental distractions;Multiple dry swallows after each bite/sip Postural Changes and/or Swallow Maneuvers: Seated upright 90 degrees   Ambulatory dysfunction Bedbound.   Primary hypertension -Continue clonidine   Obesity Estimated body mass index is 32.14 kg/m as calculated from the following:   Height as of this encounter: 5\' 5"  (1.651 m).   Weight as of this encounter: 87.6 kg.   Discharge Planning: Plan is to discharge back to facility when medically ready    Family Contact: updated daughter    IDT: Updated   Goals of Care: DNR  Please use [facility] for transport when patient is ready to discharge.   Please call with any questions or concerns. Thank you  Dionicio Stall, [facility] Liaison [phone]

## 2022-11-07 NOTE — Progress Notes (Signed)
PROGRESS NOTE    [name]  ZOX:[id number] DOB: [birth year] DOA: 10/30/2022 PCP: Oneita Hurt, No   Brief Narrative: Leslie Farley is a 89 y.o. female with a history of diastolic heart failure, hypertension, dementia currently on hospice.  Patient presented secondary to vomiting with coffee-ground emesis in addition to a severely distended abdomen.  On admission, patient was found to have evidence of a partial small obstruction with transition point in the terminal ileum.  Gastroenterology was consulted for coffee-ground emesis with decision made for conservative management secondary to goals of care.  General surgery was consulted for obstruction within assessment more consistent with likely pseudoobstruction/ileus.  Patient managed with suppositories with development of bowel movements.    Assessment and Plan:  Pseudoobstruction/ileus Initial CT scan of the abdomen/pelvis was concerning for a partial bowel obstruction with transition point in the terminal ileum.  General surgery is consulted with assessment more consistent with pseudoobstruction versus ileus.  NG tube was placed but dislodged and not reinserted.  Patient started on Dulcolax suppositories per general surgery.  Patient having bowel movements with Dulcolax suppositories. General surgery recommendations: Continue daily suppositories, mobilize, Reglan; signed off -Full liquid diet  Coffee-ground emesis FOBT positive stool GI consulted and decision for conservative management secondary to goals of care. Hemoglobin stable.  Chronic anemia Baseline hemoglobin is around 9.  Hemoglobin of 11.1 g/dL on admission with drop to low of 8.6.  Complicated by concern for coffee-ground emesis.  Hemoglobin has remained stable without need for blood transfusion.  Hypokalemia Recurrent and persistent. Potassium only up to 2.8. Complicated by patient non-adherence to oral supplementation and loss of IV for IV supplementation -Continue  potassium supplementation  Hypomagnesemia Patient given magnesium supplementation. Magnesium of 1.8 today -Magnesium supplementation  Dementia -Delirium precautions  Chronic diastolic heart failure Stable.  Dysphagia -SLP recommendations (9/13): Diet recommendations: Thin liquid Liquids provided via: Teaspoon;Cup;No straw Medication Administration: Crushed with puree Supervision: Full supervision/cueing for compensatory strategies;Trained caregiver to feed patient Compensations: Slow rate;Small sips/bites;Minimize environmental distractions;Multiple dry swallows after each bite/sip Postural Changes and/or Swallow Maneuvers: Seated upright 90 degrees  Ambulatory dysfunction Bedbound.  Primary hypertension -Continue clonidine  Obesity Estimated body mass index is 32.14 kg/m as calculated from the following:   Height as of this encounter: 5\' 5"  (1.651 m).   Weight as of this encounter: 87.6 kg.   DVT prophylaxis: SCDs Start: 10/31/22 0349 Code Status:   Code Status: Limited: Do not attempt resuscitation (DNR) -DNR-LIMITED -Do Not Intubate/DNI  Family Communication: None at bedside Disposition Plan: Discharge back to SNF with hospice hopefully in 24 hours once electrolyte issues addressed   Consultants:  General surgery Palliative care medicine Gastroenterology  Procedures:  None  Antimicrobials: None    Subjective: No concerns.  Objective: BP (!) 143/65 (BP Location: Right Arm)   Pulse 76   Temp 98.1 F (36.7 C) (Oral)   Resp 16   Ht 5\' 5"  (1.651 m)   Wt 87.6 kg   SpO2 99%   BMI 32.14 kg/m   Examination:  General exam: Appears calm and comfortable Respiratory system: Clear to auscultation. Respiratory effort normal. Cardiovascular system: S1 & S2 heard, Normal rate with regular rhythm. Gastrointestinal system: Abdomen is nondistended, soft and nontender. Normal bowel sounds heard. Central nervous system: Alert and oriented to self.   Data  Reviewed: I have personally reviewed following labs and imaging studies  CBC Lab Results  Component Value Date   WBC 4.3 11/06/2022   RBC 3.02 (L) 11/06/2022   HGB  9.2 (L) 11/06/2022   HCT 29.8 (L) 11/06/2022   MCV 98.7 11/06/2022   MCH 30.5 11/06/2022   PLT 245 11/06/2022   MCHC 30.9 11/06/2022   RDW 14.3 11/06/2022   LYMPHSABS 1.3 10/31/2022   MONOABS 0.5 10/31/2022   EOSABS 0.0 10/31/2022   BASOSABS 0.0 10/31/2022     Last metabolic panel Lab Results  Component Value Date   NA 137 11/06/2022   K 2.8 (L) 11/07/2022   CL 104 11/06/2022   CO2 25 11/06/2022   BUN 8 11/06/2022   CREATININE 0.50 11/06/2022   GLUCOSE 93 11/06/2022   GFRNONAA >60 11/06/2022   GFRAA >60 10/31/2014   CALCIUM 8.6 (L) 11/06/2022   PHOS 2.7 11/01/2022   PROT 6.5 11/01/2022   ALBUMIN 3.1 (L) 11/01/2022   BILITOT 0.6 11/01/2022   ALKPHOS 57 11/01/2022   AST 42 (H) 11/01/2022   ALT 41 11/01/2022   ANIONGAP 8 11/06/2022    GFR: Estimated Creatinine Clearance: 52.1 mL/min (by C-G formula based on SCr of 0.5 mg/dL).  No results found for this or any previous visit (from the past 240 hour(s)).    Radiology Studies: No results found.    LOS: 7 days    Leslie Hawking, MD [facility] 11/07/2022, 12:31 PM   If 7PM-7AM, please contact night-coverage [email or web address]

## 2022-11-08 ENCOUNTER — Inpatient Hospital Stay (HOSPITAL_COMMUNITY)

## 2022-11-08 DIAGNOSIS — K56600 Partial intestinal obstruction, unspecified as to cause: Secondary | ICD-10-CM | POA: Diagnosis not present

## 2022-11-08 LAB — POTASSIUM: Potassium: 3.4 mmol/L — ABNORMAL LOW (ref 3.5–5.1)

## 2022-11-08 NOTE — Progress Notes (Signed)
PROGRESS NOTE    Leslie Farley  ZOX:[id number] DOB: 1930/12/24 DOA: 10/30/2022 PCP: Oneita Hurt, No   Brief Narrative: Leslie Farley is a 89 y.o. female with a history of diastolic heart failure, hypertension, dementia currently on hospice.  Patient presented secondary to vomiting with coffee-ground emesis in addition to a severely distended abdomen.  On admission, patient was found to have evidence of a partial small obstruction with transition point in the terminal ileum.  Gastroenterology was consulted for coffee-ground emesis with decision made for conservative management secondary to goals of care.  General surgery was consulted for obstruction within assessment more consistent with likely pseudoobstruction/ileus.  Patient managed with suppositories with development of bowel movements but now with worsening abdominal distention and abdominal pain.    Assessment and Plan:  Pseudoobstruction/ileus Initial CT scan of the abdomen/pelvis was concerning for a partial bowel obstruction with transition point in the terminal ileum.  General surgery is consulted with assessment more consistent with pseudoobstruction versus ileus.  NG tube was placed but dislodged and not reinserted.  Patient started on Dulcolax suppositories per general surgery.  Patient having bowel movements with Dulcolax suppositories. General surgery recommendations: Continue daily suppositories, mobilize, Reglan; signed off -Full liquid diet -Abdominal x-ray for worsened distention and tenderness  Coffee-ground emesis FOBT positive stool GI consulted and decision for conservative management secondary to goals of care. Hemoglobin stable.  Chronic anemia Baseline hemoglobin is around 9.  Hemoglobin of 11.1 g/dL on admission with drop to low of 8.6.  Complicated by concern for coffee-ground emesis.  Hemoglobin has remained stable without need for blood transfusion.  Hypokalemia Recurrent and persistent. Potassium only up to  2.8. Complicated by patient non-adherence to oral supplementation and loss of IV for IV supplementation. Potassium pending today. -Continue potassium supplementation -Keep potassium >4 secondary to associated ileus/pseudoobstruction  Hypomagnesemia Patient given magnesium supplementation. Magnesium of 1.8 today -Magnesium supplementation  Dementia -Delirium precautions  Chronic diastolic heart failure Stable.  Dysphagia -SLP recommendations (9/13): Diet recommendations: Thin liquid Liquids provided via: Teaspoon;Cup;No straw Medication Administration: Crushed with puree Supervision: Full supervision/cueing for compensatory strategies;Trained caregiver to feed patient Compensations: Slow rate;Small sips/bites;Minimize environmental distractions;Multiple dry swallows after each bite/sip Postural Changes and/or Swallow Maneuvers: Seated upright 90 degrees  Ambulatory dysfunction Bedbound.  Primary hypertension -Continue clonidine  Obesity Estimated body mass index is 31.62 kg/m as calculated from the following:   Height as of this encounter: 5\' 5"  (1.651 m).   Weight as of this encounter: 86.2 kg.   DVT prophylaxis: SCDs Start: 10/31/22 0349 Code Status:   Code Status: Limited: Do not attempt resuscitation (DNR) -DNR-LIMITED -Do Not Intubate/DNI  Family Communication: None at bedside Disposition Plan: Discharge back to SNF with hospice pending improvement of ileus/pseudoobstruction   Consultants:  General surgery Palliative care medicine Gastroenterology  Procedures:  None  Antimicrobials: None    Subjective: Abdominal pain today.  Objective: BP (!) 177/82 (BP Location: Right Arm)   Pulse 71   Temp 98.4 F (36.9 C) (Oral)   Resp 18   Ht 5\' 5"  (1.651 m)   Wt 86.2 kg   SpO2 100%   BMI 31.62 kg/m   Examination:  General exam: Appears calm and comfortable Gastrointestinal system: Abdomen is significantly distended, soft and generally tender. Large  ventral hernia. Normal bowel sounds heard. Central nervous system: Alert. Musculoskeletal: No edema. No calf tenderness   Data Reviewed: I have personally reviewed following labs and imaging studies  CBC Lab Results  Component Value Date   WBC  4.3 11/06/2022   RBC 3.02 (L) 11/06/2022   HGB 9.2 (L) 11/06/2022   HCT 29.8 (L) 11/06/2022   MCV 98.7 11/06/2022   MCH 30.5 11/06/2022   PLT 245 11/06/2022   MCHC 30.9 11/06/2022   RDW 14.3 11/06/2022   LYMPHSABS 1.3 10/31/2022   MONOABS 0.5 10/31/2022   EOSABS 0.0 10/31/2022   BASOSABS 0.0 10/31/2022     Last metabolic panel Lab Results  Component Value Date   NA 137 11/06/2022   K 2.8 (L) 11/07/2022   CL 104 11/06/2022   CO2 25 11/06/2022   BUN 8 11/06/2022   CREATININE 0.50 11/06/2022   GLUCOSE 93 11/06/2022   GFRNONAA >60 11/06/2022   GFRAA >60 10/31/2014   CALCIUM 8.6 (L) 11/06/2022   PHOS 2.7 11/01/2022   PROT 6.5 11/01/2022   ALBUMIN 3.1 (L) 11/01/2022   BILITOT 0.6 11/01/2022   ALKPHOS 57 11/01/2022   AST 42 (H) 11/01/2022   ALT 41 11/01/2022   ANIONGAP 8 11/06/2022    GFR: Estimated Creatinine Clearance: 51.7 mL/min (by C-G formula based on SCr of 0.5 mg/dL).  No results found for this or any previous visit (from the past 240 hour(s)).    Radiology Studies: No results found.    LOS: 8 days    Jacquelin Hawking, MD [facility] 11/08/2022, 8:45 AM   If 7PM-7AM, please contact night-coverage [email or web address]

## 2022-11-08 NOTE — Progress Notes (Signed)
[facility] Liaison Note  [name] is a current hospice patient, start of care 8.29.24 with hospice diagnosos of essential hypertension, followed at [facility] who was admitted to [facility] on 9.7.24 with primary diagnosis of small bowel obstruction. Per Dr. Gordy Savers, hospice physician, this is a related hospital admission.   Patient sitting up in bed being fed by NT, jello, which she seems to be enjoying. Refusing soup and Miralax. Per bedside RN, she refused her meds earlier. No visitors are present.  Hospital was hopeful for discharge back to facility today, however, patient has developed worsening abdominal distention and abdominal pain.  Pt remains inpatient appropriate for continued evaluation and treatment of pseudoobstruction/ileus.  V/S: 98.4 oral. 177/82, 71, 18, 100% on room air  I/O: 290/350  Abnormal Labs: K+: 3.4  Diagnostics: EXAM: PORTABLE ABDOMEN - 1 VIEW IMPRESSION: Multiple gas-filled loops of small and large bowel throughout the abdomen, similar in appearance to 11/02/2022.  IV/PRN: Magnesium sulfate 2 Gm IV X 1, Protonix 40 mg IV x 2, Reglan 5 mg IV Q 6 hours  Assessment and Plan:   Pseudoobstruction/ileus Initial CT scan of the abdomen/pelvis was concerning for a partial bowel obstruction with transition point in the terminal ileum.  General surgery is consulted with assessment more consistent with pseudoobstruction versus ileus.  NG tube was placed but dislodged and not reinserted.  Patient started on Dulcolax suppositories per general surgery.  Patient having bowel movements with Dulcolax suppositories. General surgery recommendations: Continue daily suppositories, mobilize, Reglan; signed off -Full liquid diet -Abdominal x-ray for worsened distention and tenderness   Coffee-ground emesis FOBT positive stool GI consulted and decision for conservative management secondary to goals of care. Hemoglobin  stable.   Chronic anemia Baseline hemoglobin is around 9.  Hemoglobin of 11.1 g/dL on admission with drop to low of 8.6.  Complicated by concern for coffee-ground emesis.  Hemoglobin has remained stable without need for blood transfusion.   Hypokalemia Recurrent and persistent. Potassium only up to 2.8. Complicated by patient non-adherence to oral supplementation and loss of IV for IV supplementation. Potassium pending today. -Continue potassium supplementation -Keep potassium >4 secondary to associated ileus/pseudoobstruction   Hypomagnesemia Patient given magnesium supplementation. Magnesium of 1.8 today -Magnesium supplementation   Dementia -Delirium precautions   Chronic diastolic heart failure Stable.   Dysphagia -SLP recommendations (9/13): Diet recommendations: Thin liquid Liquids provided via: Teaspoon;Cup;No straw Medication Administration: Crushed with puree Supervision: Full supervision/cueing for compensatory strategies;Trained caregiver to feed patient Compensations: Slow rate;Small sips/bites;Minimize environmental distractions;Multiple dry swallows after each bite/sip Postural Changes and/or Swallow Maneuvers: Seated upright 90 degrees   Ambulatory dysfunction Bedbound.   Primary hypertension -Continue clonidine   Obesity Estimated body mass index is 31.62 kg/m as calculated from the following:   Height as of this encounter: 5\' 5"  (1.651 m).   Weight as of this encounter: 86.2 kg.  Discharge Planning: return to facility with hospice when medically appropriate.  Family Contact: Updated Nathalie by phone.  IDT: updated  GOC: DNR  Should patient need ambulance transfer at discharge- please use [facility]) as they contract this service for our active hospice patients.   Please call with any hospice related questions or concerns.  Thank you, Haynes Bast, BSN, [facility] Liaison [phone]

## 2022-11-08 NOTE — Plan of Care (Signed)

## 2022-11-08 NOTE — Plan of Care (Signed)

## 2022-11-09 DIAGNOSIS — K56600 Partial intestinal obstruction, unspecified as to cause: Secondary | ICD-10-CM | POA: Diagnosis not present

## 2022-11-09 LAB — BASIC METABOLIC PANEL
Anion gap: 7 (ref 5–15)
BUN: 6 mg/dL — ABNORMAL LOW (ref 8–23)
CO2: 24 mmol/L (ref 22–32)
Calcium: 8.8 mg/dL — ABNORMAL LOW (ref 8.9–10.3)
Chloride: 106 mmol/L (ref 98–111)
Creatinine, Ser: 0.59 mg/dL (ref 0.44–1.00)
GFR, Estimated: >60 mL/min (ref >60)
Glucose, Bld: 94 mg/dL (ref 70–99)
Potassium: 3 mmol/L — ABNORMAL LOW (ref 3.5–5.1)
Sodium: 137 mmol/L (ref 135–145)

## 2022-11-09 MED ORDER — POTASSIUM CHLORIDE CRYS ER 20 MEQ PO TBCR
40.0000 meq | EXTENDED_RELEASE_TABLET | ORAL | Status: AC
  Administered 2022-11-09 (×2): 40 meq via ORAL
  Filled 2022-11-09 (×2): qty 2

## 2022-11-09 NOTE — Progress Notes (Signed)
PROGRESS NOTE    Leslie Farley  FIE:[id number] DOB: [birth year] DOA: 10/30/2022 PCP: Oneita Hurt, No   Brief Narrative: Leslie Farley is a 89 y.o. female with a history of diastolic heart failure, hypertension, dementia currently on hospice.  Patient presented secondary to vomiting with coffee-ground emesis in addition to a severely distended abdomen.  On admission, patient was found to have evidence of a partial small obstruction with transition point in the terminal ileum.  Gastroenterology was consulted for coffee-ground emesis with decision made for conservative management secondary to goals of care.  General surgery was consulted for obstruction within assessment more consistent with likely pseudoobstruction/ileus.  Patient managed with suppositories with development of bowel movements but now with worsening abdominal distention and abdominal pain.    Assessment and Plan:  Pseudoobstruction/ileus Initial CT scan of the abdomen/pelvis was concerning for a partial bowel obstruction with transition point in the terminal ileum.  General surgery is consulted with assessment more consistent with pseudoobstruction versus ileus.  NG tube was placed but dislodged and not reinserted.  Patient started on Dulcolax suppositories per general surgery.  Patient having bowel movements with Dulcolax suppositories. General surgery recommendations: Continue daily suppositories, mobilize, Reglan; signed off -Full liquid diet -CT Abdomen/pelvis  Coffee-ground emesis FOBT positive stool GI consulted and decision for conservative management secondary to goals of care. Hemoglobin stable.  Chronic anemia Baseline hemoglobin is around 9.  Hemoglobin of 11.1 g/dL on admission with drop to low of 8.6.  Complicated by concern for coffee-ground emesis.  Hemoglobin has remained stable without need for blood transfusion.  Hypokalemia Recurrent and persistent. Potassium only up to 2.8. Complicated by patient  non-adherence to oral supplementation and loss of IV for IV supplementation. Potassium pending today. -Continue potassium supplementation -Keep potassium >4 secondary to associated ileus/pseudoobstruction  Hypomagnesemia Patient given magnesium supplementation. Magnesium of 1.8 today -Magnesium supplementation  Dementia -Delirium precautions  Chronic diastolic heart failure Stable.  Dysphagia -SLP recommendations (9/13): Diet recommendations: Thin liquid Liquids provided via: Teaspoon;Cup;No straw Medication Administration: Crushed with puree Supervision: Full supervision/cueing for compensatory strategies;Trained caregiver to feed patient Compensations: Slow rate;Small sips/bites;Minimize environmental distractions;Multiple dry swallows after each bite/sip Postural Changes and/or Swallow Maneuvers: Seated upright 90 degrees  Ambulatory dysfunction Bedbound.  Primary hypertension -Continue clonidine  Obesity Estimated body mass index is 31.62 kg/m as calculated from the following:   Height as of this encounter: 5\' 5"  (1.651 m).   Weight as of this encounter: 86.2 kg.   DVT prophylaxis: SCDs Start: 10/31/22 0349 Code Status:   Code Status: Limited: Do not attempt resuscitation (DNR) -DNR-LIMITED -Do Not Intubate/DNI  Family Communication: None at bedside Disposition Plan: Discharge back to SNF with hospice pending improvement of ileus/pseudoobstruction   Consultants:  General surgery Palliative care medicine Gastroenterology  Procedures:  None  Antimicrobials: None    Subjective: Leslie Farley interpreter usedCharleston Farley [phone]  Patient reports worsening abdominal pain and nausea. No documented emesis. She appears to be having bowel movements with suppository support. She is not answering all questions appropriately.  Objective: BP (!) 146/67 (BP Location: Left Arm)   Pulse 75   Temp 98.6 F (37 C) (Oral)   Resp 18   Ht 5\' 5"  (1.651 m)   Wt 86.2 kg   SpO2 96%    BMI 31.62 kg/m   Examination:  General exam: Appears calm and comfortable Respiratory system: Clear to auscultation. Respiratory effort normal. Cardiovascular system: S1 & S2 heard, RRR. Gastrointestinal system: Abdomen is severely distended, soft and does not appear  to be significantly tender. Large ventral hernia. Slightly high pitched bowel sounds. Central nervous system: Alert   Data Reviewed: I have personally reviewed following labs and imaging studies  CBC Lab Results  Component Value Date   WBC 4.3 11/06/2022   RBC 3.02 (L) 11/06/2022   HGB 9.2 (L) 11/06/2022   HCT 29.8 (L) 11/06/2022   MCV 98.7 11/06/2022   MCH 30.5 11/06/2022   PLT 245 11/06/2022   MCHC 30.9 11/06/2022   RDW 14.3 11/06/2022   LYMPHSABS 1.3 10/31/2022   MONOABS 0.5 10/31/2022   EOSABS 0.0 10/31/2022   BASOSABS 0.0 10/31/2022     Last metabolic panel Lab Results  Component Value Date   NA 137 11/09/2022   K 3.0 (L) 11/09/2022   CL 106 11/09/2022   CO2 24 11/09/2022   BUN 6 (L) 11/09/2022   CREATININE 0.59 11/09/2022   GLUCOSE 94 11/09/2022   GFRNONAA >60 11/09/2022   GFRAA >60 10/31/2014   CALCIUM 8.8 (L) 11/09/2022   PHOS 2.7 11/01/2022   PROT 6.5 11/01/2022   ALBUMIN 3.1 (L) 11/01/2022   BILITOT 0.6 11/01/2022   ALKPHOS 57 11/01/2022   AST 42 (H) 11/01/2022   ALT 41 11/01/2022   ANIONGAP 7 11/09/2022    GFR: Estimated Creatinine Clearance: 51.7 mL/min (by C-G formula based on SCr of 0.59 mg/dL).  No results found for this or any previous visit (from the past 240 hour(s)).    Radiology Studies: DG Abd Portable 1V  Result Date: 11/08/2022 CLINICAL DATA:  Abdominal distention. EXAM: PORTABLE ABDOMEN - 1 VIEW COMPARISON:  Abdominal radiograph dated 11/02/2022. FINDINGS: Multiple gas-filled loops of small and large bowel throughout the abdomen, similar in appearance to 11/02/2022. Air-fluid levels and free intraperitoneal air can not be excluded on the supine exam. No definite gas  overlies the rectum. Degenerative changes are seen in the spine. IMPRESSION: Multiple gas-filled loops of small and large bowel throughout the abdomen, similar in appearance to 11/02/2022. Electronically Signed   By: Romona Curls M.D.   On: 11/08/2022 10:08      LOS: 9 days    Jacquelin Hawking, MD [facility] 11/09/2022, 1:49 PM   If 7PM-7AM, please contact night-coverage [email or web address]

## 2022-11-09 NOTE — Care Management Important Message (Signed)
Important Message  Patient Details No IM Letter given due to Hospice. Name: Leslie Farley MRN: [id number] Date of Birth: 18-Nov-1930   Medicare Important Message Given:  No     Caren Macadam 11/09/2022, 2:22 PM

## 2022-11-09 NOTE — Progress Notes (Signed)
[facility] Hospitalized Hospice Patient Visit  Leslie Farley is a current hospice patient, start of care 8.29.24 with hospice diagnosos of COPD, followed at [facility] who was admitted to [facility] on 9.7.24 with primary diagnosis of small bowel obstruction. Per Dr. Gordy Savers, hospice physician, this is a related hospital admission.   Patient resting quietly when visited, appears to be sleeping without distress. Noted patient has not been cooperative with taking miralax daily. Also, noted potassium level has dropped again. No change in plan for patient to discharge back to [facility] when medically ready.   Patient remains GIP appropriate due to need for IV protonix, IV Reglan, electrolyte replacement, and close monitoring as diet is advanced.  Vital Signs: 98.6/75/18        146/67          96%on RA  I&O:  690/250  Abnormal labs: KCL 3.0, BUN 6, Calcium 8.8  Diagnostics: none new  IV/PRN Meds: protonix 40 meq IV BID, Reglan 5mg  IV every 6 hours, Potassium 40 meq po bid.  Problem List:   Pseudoobstruction/ileus Initial CT scan of the abdomen/pelvis was concerning for a partial bowel obstruction with transition point in the terminal ileum.  General surgery is consulted with assessment more consistent with pseudoobstruction versus ileus.  NG tube was placed but dislodged and not reinserted.  Patient started on Dulcolax suppositories per general surgery.  Patient having bowel movements with Dulcolax suppositories. General surgery recommendations: Continue daily suppositories, mobilize, Reglan; signed off -Full liquid diet -CT Abdomen/pelvis   Coffee-ground emesis FOBT positive stool GI consulted and decision for conservative management secondary to goals of care. Hemoglobin stable.   Chronic anemia Baseline hemoglobin is around 9.  Hemoglobin of 11.1 g/dL on admission with drop to low of 8.6.  Complicated by concern for coffee-ground emesis.  Hemoglobin has  remained stable without need for blood transfusion.   Hypokalemia Recurrent and persistent. Potassium only up to 2.8. Complicated by patient non-adherence to oral supplementation and loss of IV for IV supplementation. Potassium pending today. -Continue potassium supplementation -Keep potassium >4 secondary to associated ileus/pseudoobstruction   Hypomagnesemia Patient given magnesium supplementation. Magnesium of 1.8 today -Magnesium supplementation   Dementia -Delirium precautions   Discharge Planning: Ongoing, plan to return to [facility] upon [facility]charge with [facility], likely tomorrow.  Family Contact: Communicated with daughter by phone  IDT: Updated  Goals of Care: DNR  Should patient need ambulance transfer at discharge- please use [facility]) as they contract this service for our active hospice patients.  Glenna Fellows BSN, RN, [facility] Hospice hospital liaison [phone]

## 2022-11-10 DIAGNOSIS — K56600 Partial intestinal obstruction, unspecified as to cause: Secondary | ICD-10-CM | POA: Diagnosis not present

## 2022-11-10 LAB — BASIC METABOLIC PANEL
Anion gap: 10 (ref 5–15)
BUN: 5 mg/dL — ABNORMAL LOW (ref 8–23)
CO2: 26 mmol/L (ref 22–32)
Calcium: 9.1 mg/dL (ref 8.9–10.3)
Chloride: 101 mmol/L (ref 98–111)
Creatinine, Ser: 0.6 mg/dL (ref 0.44–1.00)
GFR, Estimated: >60 mL/min (ref >60)
Glucose, Bld: 98 mg/dL (ref 70–99)
Potassium: 3.2 mmol/L — ABNORMAL LOW (ref 3.5–5.1)
Sodium: 137 mmol/L (ref 135–145)

## 2022-11-10 MED ORDER — IOHEXOL 9 MG/ML PO SOLN: 500.0000 mL | ORAL | Status: AC

## 2022-11-10 MED ORDER — IOHEXOL 9 MG/ML PO SOLN
ORAL | Status: AC
  Filled 2022-11-10: qty 1000

## 2022-11-10 MED ORDER — POTASSIUM CHLORIDE CRYS ER 20 MEQ PO TBCR
40.0000 meq | EXTENDED_RELEASE_TABLET | ORAL | Status: AC
  Administered 2022-11-10: 40 meq via ORAL
  Filled 2022-11-10 (×2): qty 2

## 2022-11-10 NOTE — Progress Notes (Signed)
Speech Language Pathology Treatment: Dysphagia  Patient Details Name: Leslie Farley MRN: [id number] DOB: 1930-03-05 Today's Date: 11/10/2022 Time: 2536-6440 SLP Time Calculation (min) (ACUTE ONLY): 29 min  Assessment / Plan / Recommendation Clinical Impression  Pt seen for follow up regarding dysphagia.  Daughter present and reports pt has h/o frequent coughing with and without intake with variable mentation/level of alertness.  SLP used interpreter I-pad speaking with pt *but there was a significant delay in response in visual and verbal response-- indicative of poor connection.  Daughter present and speaks [place], thus she also aided communication for dysphagia session.  SLP had pt help hold the cup to self feed with inconsistent effectiveness.  Pt leaning to the right during the entire session - and daughter reports this is her baseline.  Ms Aloise was sleepy but did accept some po intake - including graham cracker, applesauce, apple juice with Miralax.  Delayed multiple subswallows noted - most especially with thin liquids - and delayed coughing - which daughter again reports is pt's baseline.  No oral pocketing observed but pt with prolonged "mastication" of solid with delayed clearance.    Leslie Farley reports her mother is on a puree/thin diet at the facility prior to admission - thus recommend she return to this diet and allow family to bring in soft foods that they can feed her *as they can take extra time to assure adequate clearance.   Discussed with daughter concern that pt may be unable to meet nutritional needs given her inconsistent mentation - and functional goal may be to have her consume po when/if accepting and fully alert/able.  Leslie Farley was agreeable to plan.    HPI HPI: Patient is an 89 y.o. [place] speaking female with PMH: dementia, HTN, COPD, asthma, anemia, GERD, arthritis. Admitted Swallow evaluation ordered after patient admitted 10/31/2022 status post NG tube, which  patient removed overnight 11/02/2022. Imaging shows concern for ileus. Recently been admitted to the hospital in 09/2022 and 08/2022. She had acute hypoxic respiratory failure with abdominal pain secondary to colonic ileus. Patient had been eating pureed foods from notes on prior evaluation - and was not wanting to wear her dentures. Swallow evaluation ordered and rec'd thin liquids d/t SBO; ST f/u for diet check/tolerance.      SLP Plan  Continue with current plan of care      Recommendations for follow up therapy are one component of a multi-disciplinary discharge planning process, led by the attending physician.  Recommendations may be updated based on patient status, additional functional criteria and insurance authorization.    Recommendations  Diet recommendations: Dysphagia 1 (puree);Thin liquid Liquids provided via: Straw Medication Administration: Crushed with puree Supervision: Full supervision/cueing for compensatory strategies Compensations: Slow rate;Small sips/bites;Other (Comment) Postural Changes and/or Swallow Maneuvers: Seated upright 90 degrees;Upright 30-60 min after meal                  Oral care QID   None Dysphagia, oral phase (R13.11);Dysphagia, unspecified (R13.10)     Continue with current plan of care    Leslie Infante, MS [facility] SLP Acute Rehab Services Office [phone]  Chales Abrahams  11/10/2022, 4:14 PM

## 2022-11-10 NOTE — Progress Notes (Signed)
PROGRESS NOTE    Leslie Farley  BJY:[id number] DOB: [birth year] DOA: 10/30/2022 PCP: Oneita Hurt, No   Brief Narrative: Leslie Farley is a 89 y.o. female with a history of diastolic heart failure, hypertension, dementia currently on hospice.  Patient presented secondary to vomiting with coffee-ground emesis in addition to a severely distended abdomen.  On admission, patient was found to have evidence of a partial small obstruction with transition point in the terminal ileum.  Gastroenterology was consulted for coffee-ground emesis with decision made for conservative management secondary to goals of care.  General surgery was consulted for obstruction within assessment more consistent with likely pseudoobstruction/ileus.  Patient managed with suppositories with development of bowel movements but now with worsening abdominal distention and abdominal pain.    Assessment and Plan:  Pseudoobstruction/ileus Initial CT scan of the abdomen/pelvis was concerning for a partial bowel obstruction with transition point in the terminal ileum.  General surgery is consulted with assessment more consistent with pseudoobstruction versus ileus.  NG tube was placed but dislodged and not reinserted.  Patient started on Dulcolax suppositories per general surgery.  Patient having bowel movements with Dulcolax suppositories. General surgery recommendations: Continue daily suppositories, mobilize, Reglan; signed off -Full liquid diet; SLP recommendations to advance diet as able  Coffee-ground emesis FOBT positive stool GI consulted and decision for conservative management secondary to goals of care. Hemoglobin stable.  Chronic anemia Baseline hemoglobin is around 9.  Hemoglobin of 11.1 g/dL on admission with drop to low of 8.6.  Complicated by concern for coffee-ground emesis.  Hemoglobin has remained stable without need for blood transfusion.  Hypokalemia Recurrent and persistent. Potassium only up to 2.8.  Complicated by patient non-adherence to oral supplementation and loss of IV for IV supplementation. Potassium 3.2 today. -Continue potassium supplementation -Keep potassium >4 secondary to associated ileus/pseudoobstruction  Hypomagnesemia Patient given magnesium supplementation.  Dementia -Delirium precautions  Chronic diastolic heart failure Stable.  Dysphagia -SLP recommendations (9/13): Diet recommendations: Thin liquid Liquids provided via: Teaspoon;Cup;No straw Medication Administration: Crushed with puree Supervision: Full supervision/cueing for compensatory strategies;Trained caregiver to feed patient Compensations: Slow rate;Small sips/bites;Minimize environmental distractions;Multiple dry swallows after each bite/sip Postural Changes and/or Swallow Maneuvers: Seated upright 90 degrees  Ambulatory dysfunction Bedbound.  Primary hypertension -Continue clonidine  Obesity Estimated body mass index is 31.62 kg/m as calculated from the following:   Height as of this encounter: 5\' 5"  (1.651 m).   Weight as of this encounter: 86.2 kg.   DVT prophylaxis: SCDs Start: 10/31/22 0349 Code Status:   Code Status: Limited: Do not attempt resuscitation (DNR) -DNR-LIMITED -Do Not Intubate/DNI  Family Communication: None at bedside Disposition Plan: Discharge back to SNF with hospice hopefully tomorrow pending improvement of ileus/pseudoobstruction and SLP recommendations for diet on discharge.   Consultants:  General surgery Palliative care medicine Gastroenterology  Procedures:  None  Antimicrobials: None    Subjective: Patient repeats for [place] to leave her alone in french when attempting to feed her oral contrast.  Objective: BP (!) 187/93 (BP Location: Left Arm)   Pulse 74   Temp 98 F (36.7 C) (Oral)   Resp 18   Ht 5\' 5"  (1.651 m)   Wt 86.2 kg   SpO2 100%   BMI 31.62 kg/m   Examination:  General exam: Appears calm and comfortable Respiratory system:  Clear to auscultation. Respiratory effort normal. Cardiovascular system: S1 & S2 heard, RRR. No murmurs, rubs, gallops or clicks. Gastrointestinal system: Abdomen is distended but improved, soft and non-tender. Ventral hernia is  large and less pronounced. Improved bowel sounds heard. Central nervous system: Alert. Musculoskeletal: No edema. No calf tenderness   Data Reviewed: I have personally reviewed following labs and imaging studies  CBC Lab Results  Component Value Date   WBC 4.3 11/06/2022   RBC 3.02 (L) 11/06/2022   HGB 9.2 (L) 11/06/2022   HCT 29.8 (L) 11/06/2022   MCV 98.7 11/06/2022   MCH 30.5 11/06/2022   PLT 245 11/06/2022   MCHC 30.9 11/06/2022   RDW 14.3 11/06/2022   LYMPHSABS 1.3 10/31/2022   MONOABS 0.5 10/31/2022   EOSABS 0.0 10/31/2022   BASOSABS 0.0 10/31/2022     Last metabolic panel Lab Results  Component Value Date   NA 137 11/10/2022   K 3.2 (L) 11/10/2022   CL 101 11/10/2022   CO2 26 11/10/2022   BUN 5 (L) 11/10/2022   CREATININE 0.60 11/10/2022   GLUCOSE 98 11/10/2022   GFRNONAA >60 11/10/2022   GFRAA >60 10/31/2014   CALCIUM 9.1 11/10/2022   PHOS 2.7 11/01/2022   PROT 6.5 11/01/2022   ALBUMIN 3.1 (L) 11/01/2022   BILITOT 0.6 11/01/2022   ALKPHOS 57 11/01/2022   AST 42 (H) 11/01/2022   ALT 41 11/01/2022   ANIONGAP 10 11/10/2022    GFR: Estimated Creatinine Clearance: 51.7 mL/min (by C-G formula based on SCr of 0.6 mg/dL).  No results found for this or any previous visit (from the past 240 hour(s)).    Radiology Studies: No results found.    LOS: 10 days    Jacquelin Hawking, MD [facility] 11/10/2022, 3:10 PM   If 7PM-7AM, please contact night-coverage [email or web address]

## 2022-11-10 NOTE — Progress Notes (Signed)
[facility] Patient Visit  Ms. Ahria Meno is a current hospice patient, start of care 8.29.24 with hospice diagnosos of COPD, followed at [facility] who was admitted to [facility] on 9.7.24 with primary diagnosis of small bowel obstruction. Per Dr. Gordy Savers, hospice physician, this is a related hospital admission.   Visited with patient and daughter in hospital. Patient is without distress or complaint. Daughter is going to attempt to get patient to take oral meds that were refused this am including miralax. No change in plan for patient to discharge back to [facility] when medically ready.  Patient remains GIP appropriate due to need for IV protonix, IV Reglan, electrolyte replacement, and close monitoring as diet is advanced.  Vital Signs: 98.7/75/17      162/67     100% RA  I&O:  482/600  Abnormal labs: KCL 3.2, BUN 5  Diagnostics: none new  IV/PRN Meds: protonix 40 meq IV BID, Reglan 5mg  IV every 6 hours, Potassium 40 meq po bid.  Problem List:   Pseudoobstruction/ileus Initial CT scan of the abdomen/pelvis was concerning for a partial bowel obstruction with transition point in the terminal ileum.  General surgery is consulted with assessment more consistent with pseudoobstruction versus ileus.  NG tube was placed but dislodged and not reinserted.  Patient started on Dulcolax suppositories per general surgery.  Patient having bowel movements with Dulcolax suppositories. General surgery recommendations: Continue daily suppositories, mobilize, Reglan; signed off -Full liquid diet; SLP recommendations to advance diet as able   Coffee-ground emesis FOBT positive stool GI consulted and decision for conservative management secondary to goals of care. Hemoglobin stable.    Hypokalemia Recurrent and persistent. Potassium only up to 2.8. Complicated by patient non-adherence to oral supplementation and loss of IV for IV supplementation.  Potassium 3.2 today. -Continue potassium supplementation -Keep potassium >4 secondary to associated ileus/pseudoobstruction   Dementia -Delirium precaution    Dysphagia -SLP recommendations (9/13): Diet recommendations: Thin liquid Liquids provided via: Teaspoon;Cup;No straw Medication Administration: Crushed with puree Supervision: Full supervision/cueing for compensatory strategies;Trained caregiver to feed patient Compensations: Slow rate;Small sips/bites;Minimize environmental distractions;Multiple dry swallows after each bite/sip Postural Changes and/or Swallow Maneuvers: Seated upright 90 degrees  Discharge Planning: Ongoing, plan to return to [facility] upon Discharge with [facility], likely tomorrow.  Family Contact: Met with daughter at bedside  IDT: Updated  Goals of Care: DNR  Should patient need ambulance transfer at discharge- please use [facility]) as they contract this service for our active hospice patients.  Glenna Fellows BSN, RN, [facility] hospital liaison [phone]

## 2022-11-10 NOTE — Progress Notes (Signed)
PMT no charge note.   Chart reviewed, hospice liaison and [facility] MD notes reviewed.   Discharge back to SNF with hospice hopefully tomorrow pending improvement of ileus/pseudoobstruction and SLP recommendations for diet on discharge.  DNR DNI No new PMT specific recommendations at this time.  No charge Rosalin Hawking MD [facility] palliative.

## 2022-11-11 DIAGNOSIS — K56600 Partial intestinal obstruction, unspecified as to cause: Secondary | ICD-10-CM | POA: Diagnosis not present

## 2022-11-11 LAB — MAGNESIUM: Magnesium: 1.5 mg/dL — ABNORMAL LOW (ref 1.7–2.4)

## 2022-11-11 LAB — POTASSIUM: Potassium: 3.4 mmol/L — ABNORMAL LOW (ref 3.5–5.1)

## 2022-11-11 MED ORDER — MAGNESIUM SULFATE 2 GM/50ML IV SOLN
2.0000 g | Freq: Once | INTRAVENOUS | Status: AC
  Administered 2022-11-11: 2 g via INTRAVENOUS
  Filled 2022-11-11: qty 50

## 2022-11-11 MED ORDER — POTASSIUM CHLORIDE 10 MEQ/100ML IV SOLN
10.0000 meq | INTRAVENOUS | Status: AC
  Administered 2022-11-11 (×4): 10 meq via INTRAVENOUS
  Filled 2022-11-11 (×2): qty 100

## 2022-11-11 MED ORDER — POTASSIUM CHLORIDE CRYS ER 20 MEQ PO TBCR
40.0000 meq | EXTENDED_RELEASE_TABLET | ORAL | Status: AC
  Administered 2022-11-11 (×2): 40 meq via ORAL
  Filled 2022-11-11 (×2): qty 2

## 2022-11-11 NOTE — Progress Notes (Signed)
[facility] Patient Visit  Leslie Farley is a current hospice patient, start of care 8.29.24 with hospice diagnosos of COPD, followed at [facility] who was admitted to [facility] on 9.7.24 with primary diagnosis of small bowel obstruction. Per Dr. Gordy Savers, hospice physician, this is a related hospital admission.   Visited with patient in hospital. She is resting quietly, appears to be sleeping without distress. Staff report she took all am meds including miralax. Noted potassium dropped with AM lab results. Patient is received IV and po replacement. No change in plan for patient to discharge back to [facility] when medically ready.   Patient remains GIP appropriate due to need for IV protonix, IV Reglan, electrolyte replacement, and close monitoring for resolution of ileus/pseudoobstruction  Vital Signs: 98/66/18      153/77     O2 97% on RA  I&O:  60/not documented  Abnormal labs: KCL 2.8  Diagnostics: none new  IV/PRN Meds: protonix 40 meq IV BID, Reglan 5mg  IV every 6 hours, Potassium 40 meq po x 2, Potassium IV x4  Problem List:  Pseudoobstruction/ileus: Initial CT scan of the abdomen/pelvis was concerning for a partial bowel obstruction with transition point in the terminal ileum.  Seen by general surgery as per their assessment this is consistent with pseudo-obstruction versus ileus.Patient manage NG tube decompression, IV hydration Dulcolax suppository.  Not having bowel movement with suppository, advised to continue daily suppository Mobolaji continue Reglan and surgery signed off.  Speech following for diet recommendation waiting for final diet recommendations for discharge back to facility with hospice care.   Chronic anemia  Coffee-ground emesis FOBT positive stool: GI consulted and decision for conservative management secondary to goals of care. Hemoglobin stable-baseline around 9, somewhat elevated on admission likely  from hemoconcentration   Hypokalemia Hypomagnesemia: Recurrent low potassium replating aggressively Replete magnesium. Aim to keep K>4 due to associated ileus/pseudoobstruction.  Last Labs  Dementia: Mentation fairly stable alert awake conversant, continue delirium precautions    Discharge Planning: Ongoing, plan to return to [facility] upon Discharge with [facility], likely tomorrow once Potassium stabilized. Family Contact: Communicated with daughter by phone.  IDT: Updated  Goals of Care: DNR  Should patient need ambulance transfer at discharge- please use [facility]) as they contract this service for our active hospice patients.  Glenna Fellows BSN, RN, [facility] hospital liaison [phone]

## 2022-11-11 NOTE — Progress Notes (Signed)
PROGRESS NOTE Leslie Farley  WGN:[id number] DOB: [birth year] DOA: 10/30/2022 PCP: Pcp, No  Brief Narrative/Hospital Course: Leslie Farley is a 89 y.o. female with a history of diastolic heart failure, hypertension, dementia currently on hospice.  Patient presented secondary to vomiting with coffee-ground emesis in addition to a severely distended abdomen.  On admission, patient was found to have evidence of a partial small obstruction with transition point in the terminal ileum.  Gastroenterology was consulted for coffee-ground emesis with decision made for conservative management secondary to goals of care.  General surgery was consulted for obstruction within assessment more consistent with likely pseudoobstruction/ileus.  Patient managed with suppositories with development of bowel movements, abdomen remains distended although having bowel movement    Subjective: Patient seen and examined this morning alert awake able to tell me her name on also complains of some abdominal pain Last bowel movement 9/17 Overnight afebrile BP stable Labs are significant low potassium and mag 1.5   Assessment and Plan: Principal Problem:   Partial small bowel obstruction (HCC)   Pseudoobstruction/ileus: Initial CT scan of the abdomen/pelvis was concerning for a partial bowel obstruction with transition point in the terminal ileum.  Seen by general surgery as per their assessment this is consistent with pseudo-obstruction versus ileus.Patient manage NG tube decompression, IV hydration Dulcolax suppository.  Not having bowel movement with suppository, advised to continue daily suppository Mobolaji continue Reglan and surgery signed off.  Speech following for diet recommendation waiting for final diet recommendations for discharge back to facility with hospice care.  Chronic anemia  Coffee-ground emesis FOBT positive stool: GI consulted and decision for conservative management secondary to goals of care.  Hemoglobin stable-baseline around 9, somewhat elevated on admission likely from hemoconcentration   Hypokalemia Hypomagnesemia: Recurrent low potassium replating aggressively Replete magnesium. Aim to keep K>4 due to associated ileus/pseudoobstruction.  Recent Labs  Lab 11/05/22 0501 11/06/22 0456 11/06/22 1558 11/07/22 0739 11/08/22 0937 11/09/22 0809 11/10/22 0645 11/11/22 0456  K 3.6 2.5*   < > 2.8* 3.4* 3.0* 3.2* 2.8*  CALCIUM 8.6* 8.6*  --   --   --  8.8* 9.1  --   MG  --  2.0  --  1.8  --   --   --  1.5*   < > = values in this interval not displayed.   Dementia: Mentation fairly stable alert awake conversant, continue delirium precautions   Chronic diastolic heart failure. Stable.   Dysphagia: Dys 1 diet. slp following   Ambulatory dysfunction Patient is bedbound    Primary hypertension BP stable on cont clonidine weekly patch.  PTA patient was on amlodipine.   Class I Obesity:Patient's Body mass index is 31.62 kg/m. : Will benefit with PCP follow-up, weight loss  healthy lifestyle and outpatient sleep evaluation.  DVT prophylaxis: SCDs Start: 10/31/22 0349 Code Status:   Code Status: Limited: Do not attempt resuscitation (DNR) -DNR-LIMITED -Do Not Intubate/DNI  Family Communication: plan of care discussed with patient  at bedside. Patient status is:  inpatient because of hypokalemia, slp eval Level of care: Telemetry   Dispo: The patient is from: Home            Anticipated disposition: TOMORROW once k stable  Objective: Vitals last 24 hrs: Vitals:   11/09/22 2225 11/10/22 1300 11/10/22 2158 11/11/22 0542  BP: (!) 162/67 (!) 187/93 (!) 156/84 (!) 153/77  Pulse: 75 74 87 66  Resp: 17 18 17 18   Temp: 98.7 F (37.1 C) 98 F (36.7 C) 98.2 F (  36.8 C) 98 F (36.7 C)  TempSrc: Oral Oral Oral Oral  SpO2: 100% 100% 99% 97%  Weight:      Height:       Weight change:   Physical Examination: General exam: alert awake, oriented to self current place   HEENT:Oral mucosa moist, Ear/Nose WNL grossly Respiratory system: bilaterally  clear BS, no use of accessory muscle Cardiovascular system: S1 & S2 +, No JVD. Gastrointestinal system: Abdomen soft, moderately distended bowel sound present  mildly tender  Nervous System:Alert, awake, moving extremities. Extremities: LE edema mild,distal peripheral pulses palpable.  Skin: No rashes,no icterus. MSK: Normal muscle bulk,tone, power  Medications reviewed:  Scheduled Meds:  bisacodyl  10 mg Rectal Daily   cloNIDine  0.1 mg Transdermal Weekly   cycloSPORINE  1 drop Both Eyes BID   docusate sodium  100 mg Oral BID   Gerhardt's butt cream   Topical BID   metoCLOPramide (REGLAN) injection  5 mg Intravenous Q6H   mometasone-formoterol  2 puff Inhalation BID   pantoprazole (PROTONIX) IV  40 mg Intravenous BID   polyethylene glycol  17 g Oral Daily   polyvinyl alcohol  1 drop Both Eyes BID   potassium chloride  40 mEq Oral Q4H  Continuous Infusions:  potassium chloride 10 mEq (11/11/22 1145)    Diet Order             DIET - DYS 1 Room service appropriate? No; Fluid consistency: Thin  Diet effective now                  Intake/Output Summary (Last 24 hours) at 11/11/2022 1311 Last data filed at 11/11/2022 1053 Gross per 24 hour  Intake 60 ml  Output 550 ml  Net -490 ml  Net IO Since Admission: 7,232.52 mL [11/11/22 1311]  Wt Readings from Last 3 Encounters:  11/08/22 86.2 kg  10/16/22 85.2 kg  09/15/22 82.4 kg   Unresulted Labs (From admission, onward)    None     Data Reviewed: I have personally reviewed following labs and imaging studies. Recent Labs  Lab 11/05/22 0501 11/06/22 0456  WBC 5.0 4.3  HGB 9.8* 9.2*  HCT 31.7* 29.8*  MCV 101.3* 98.7  PLT 131* 245  Basic Metabolic Panel: Recent Labs  Lab 11/05/22 0501 11/06/22 0456 11/06/22 1558 11/07/22 0739 11/08/22 0937 11/09/22 0809 11/10/22 0645 11/11/22 0456  NA 139 137  --   --   --  137 137  --   K 3.6  2.5*   < > 2.8* 3.4* 3.0* 3.2* 2.8*  CL 109 104  --   --   --  106 101  --   CO2 17* 25  --   --   --  24 26  --   GLUCOSE 101* 93  --   --   --  94 98  --   BUN 9 8  --   --   --  6* 5*  --   CREATININE 0.63 0.50  --   --   --  0.59 0.60  --   CALCIUM 8.6* 8.6*  --   --   --  8.8* 9.1  --   MG  --  2.0  --  1.8  --   --   --  1.5*   < > = values in this interval not displayed.  No results found for this or any previous visit (from the past 240 hour(s)).  Antimicrobials: Anti-infectives (From admission, onward)  None     Culture/Microbiology    Component Value Date/Time   SDES URINE, CLEAN CATCH 10/11/2022 1345   SPECREQUEST  10/11/2022 1345    NONE Performed at [facility], [address] [place], [place] [address]    CULT >=100,000 COLONIES/mL KLEBSIELLA PNEUMONIAE (A) 10/11/2022 1345   REPTSTATUS 10/13/2022 FINAL 10/11/2022 1345  Radiology Studies: No results found.  LOS: 11 days   Lanae Boast, MD [facility] 11/11/2022, 1:11 PM

## 2022-11-12 DIAGNOSIS — K56600 Partial intestinal obstruction, unspecified as to cause: Secondary | ICD-10-CM | POA: Diagnosis not present

## 2022-11-12 LAB — BASIC METABOLIC PANEL
Anion gap: 13 (ref 5–15)
Anion gap: 10 (ref 5–15)
BUN: 5 mg/dL — ABNORMAL LOW (ref 8–23)
BUN: 5 mg/dL — ABNORMAL LOW (ref 8–23)
CO2: 24 mmol/L (ref 22–32)
CO2: 26 mmol/L (ref 22–32)
Calcium: 8.9 mg/dL (ref 8.9–10.3)
Calcium: 9 mg/dL (ref 8.9–10.3)
Chloride: 101 mmol/L (ref 98–111)
Chloride: 102 mmol/L (ref 98–111)
Creatinine, Ser: 0.58 mg/dL (ref 0.44–1.00)
Creatinine, Ser: 0.53 mg/dL (ref 0.44–1.00)
GFR, Estimated: >60 mL/min (ref >60)
GFR, Estimated: >60 mL/min (ref >60)
Glucose, Bld: 90 mg/dL (ref 70–99)
Glucose, Bld: 104 mg/dL — ABNORMAL HIGH (ref 70–99)
Potassium: 3.2 mmol/L — ABNORMAL LOW (ref 3.5–5.1)
Potassium: 3.4 mmol/L — ABNORMAL LOW (ref 3.5–5.1)
Sodium: 138 mmol/L (ref 135–145)
Sodium: 138 mmol/L (ref 135–145)

## 2022-11-12 LAB — MAGNESIUM: Magnesium: 1.6 mg/dL — ABNORMAL LOW (ref 1.7–2.4)

## 2022-11-12 MED ORDER — POTASSIUM CHLORIDE 10 MEQ/100ML IV SOLN
10.0000 meq | INTRAVENOUS | Status: AC
  Administered 2022-11-12 (×3): 10 meq via INTRAVENOUS
  Filled 2022-11-12 (×3): qty 100

## 2022-11-12 MED ORDER — IPRATROPIUM-ALBUTEROL 0.5-2.5 (3) MG/3ML IN SOLN
3.0000 mL | Freq: Two times a day (BID) | RESPIRATORY_TRACT | Status: DC
  Administered 2022-11-13: 3 mL via RESPIRATORY_TRACT
  Filled 2022-11-12: qty 3

## 2022-11-12 MED ORDER — POTASSIUM CHLORIDE 10 MEQ/100ML IV SOLN
10.0000 meq | INTRAVENOUS | Status: AC
  Administered 2022-11-12 (×3): 10 meq via INTRAVENOUS
  Filled 2022-11-12: qty 100

## 2022-11-12 MED ORDER — POTASSIUM CHLORIDE CRYS ER 20 MEQ PO TBCR
40.0000 meq | EXTENDED_RELEASE_TABLET | Freq: Once | ORAL | Status: AC
  Administered 2022-11-12: 40 meq via ORAL

## 2022-11-12 MED ORDER — MAGNESIUM SULFATE 2 GM/50ML IV SOLN
2.0000 g | Freq: Once | INTRAVENOUS | Status: AC
  Administered 2022-11-12: 2 g via INTRAVENOUS
  Filled 2022-11-12: qty 50

## 2022-11-12 NOTE — Care Management Important Message (Signed)
Important Message  Patient Details No IM Letter given due to Discharging with Hospice. Name: Leslie Farley MRN: [id number] Date of Birth: [birth year]   Medicare Important Message Given:  No     Caren Macadam 11/12/2022, 10:37 AM

## 2022-11-12 NOTE — Progress Notes (Signed)
PROGRESS NOTE Leslie Farley  WGN:[id number] DOB: 1930/07/13 DOA: 10/30/2022 PCP: Pcp, No  Brief Narrative/Hospital Course: Leslie Farley is a 89 y.o. female with a history of diastolic heart failure, hypertension, dementia currently on hospice.  Patient presented secondary to vomiting with coffee-ground emesis in addition to a severely distended abdomen.  On admission, patient was found to have evidence of a partial small obstruction with transition point in the terminal ileum.  Gastroenterology was consulted for coffee-ground emesis with decision made for conservative management secondary to goals of care.  General surgery was consulted for obstruction within assessment more consistent with likely pseudoobstruction/ileus.  Patient managed with suppositories with development of bowel movements, abdomen remains distended although having bowel movement    Subjective: Patient seen examined this morning She is alert awake resting comfortably denies any abdominal pain nausea vomiting  She is confused at baseline Last BM yesterday morning.   Assessment and Plan: Principal Problem:   Partial small bowel obstruction (HCC)  Pseudo-obstruction/ileus: Initial CT scan of the abdomen/pelvis was concerning for a partial bowel obstruction with transition point in the terminal ileum. Seen by [facility] as per their assessment this is consistent with pseudo-obstruction versus ileus. Patient managed w/ NG tube decompression, Reglan IV fluids,dulcolax suppository- cont daily suppository.. Surgery signed off. SLP following for diet recommendation.   Chronic anemia  Coffee-ground emesis FOBT positive stool: GI consulted and decision was for conservative management. Hb stable,baseline around 9 gm, somewhat elevated on admission likely from hemoconcentration   Hypokalemia Hypomagnesemia: Repleting again iv and po,recheck today Aim to keep K>4 due to associated ileus/pseudoobstruction.  Recent Labs  Lab  11/06/22 0456 11/06/22 1558 11/07/22 0739 11/08/22 0937 11/09/22 0809 11/10/22 0645 11/11/22 0456 11/11/22 1850 11/12/22 0537  K 2.5*   < > 2.8*   < > 3.0* 3.2* 2.8* 3.4* 3.2*  CALCIUM 8.6*  --   --   --  8.8* 9.1  --   --  8.9  MG 2.0  --  1.8  --   --   --  1.5*  --  1.6*   < > = values in this interval not displayed.   Dementia: Mentation fairly stable,alert awake-continue delirium precautions   Chronic diastolic heart failure. Stable.   Dysphagia: Dys 1 diet as per SLP   Ambulatory dysfunction Patient is bedbound    Primary hypertension PTA patient was on amlodipine.BP fairly stable on clonidine patch low-dose.    Class I Obesity:Patient's Body mass index is 32.5 kg/m.  Will benefit with PCP follow-up, weight loss  healthy lifestyle and outpatient sleep evaluation.  Goals of care: Patient is DNR DNI recently enrolled in hospice plan is to discharge back to SNF w/ hospice, after stabilization.  DVT prophylaxis: SCDs Start: 10/31/22 0349 Code Status:   Code Status: Limited: Do not attempt resuscitation (DNR) -DNR-LIMITED -Do Not Intubate/DNI  Family Communication: plan of care discussed with patient  at bedside. Patient status is:  inpatient because of hypokalemia, slp eval Level of care: Telemetry   Dispo: The patient is from: Home            Anticipated disposition: Anticipate discharge tomorrow if potassium remains stable and if patient has BM   Objective: Vitals last 24 hrs: Vitals:   11/11/22 2127 11/11/22 2300 11/12/22 0649 11/12/22 0710  BP: (!) 160/79 123/82 (!) 158/94   Pulse: 70 70 72   Resp: 18 18 18    Temp: 97.6 F (36.4 C) 97.7 F (36.5 C) 97.7 F (36.5 C)  TempSrc: Oral Oral Oral   SpO2: 97% 98% 96%   Weight:    88.6 kg  Height:       Weight change:   Physical Examination: General exam: alert awake, closed mittens in place able to tell me her name.  No meaningful conversation otherwise.  HEENT:Oral mucosa moist, Ear/Nose WNL  grossly Respiratory system: Bilaterally clear BS,no use of accessory muscle Cardiovascular system: S1 & S2 +, No JVD. Gastrointestinal system: Abdomen firm with moderate distention, BS+ Nervous System: Alert, awake Extremities: LE edema neg,distal peripheral pulses palpable and warm.  Skin: No rashes,no icterus. MSK: Normal muscle bulk,tone, power   Medications reviewed:  Scheduled Meds:  bisacodyl  10 mg Rectal Daily   cloNIDine  0.1 mg Transdermal Weekly   cycloSPORINE  1 drop Both Eyes BID   docusate sodium  100 mg Oral BID   Gerhardt's butt cream   Topical BID   metoCLOPramide (REGLAN) injection  5 mg Intravenous Q6H   mometasone-formoterol  2 puff Inhalation BID   pantoprazole (PROTONIX) IV  40 mg Intravenous BID   polyethylene glycol  17 g Oral Daily   polyvinyl alcohol  1 drop Both Eyes BID  Continuous Infusions:  potassium chloride 10 mEq (11/12/22 1154)    Diet Order             DIET - DYS 1 Room service appropriate? No; Fluid consistency: Thin  Diet effective now                  Intake/Output Summary (Last 24 hours) at 11/12/2022 1249 Last data filed at 11/12/2022 0950 Gross per 24 hour  Intake 1081.25 ml  Output 0 ml  Net 1081.25 ml  Net IO Since Admission: 8,313.77 mL [11/12/22 1249]  Wt Readings from Last 3 Encounters:  11/12/22 88.6 kg  10/16/22 85.2 kg  09/15/22 82.4 kg   Unresulted Labs (From admission, onward)     Start     Ordered   11/12/22 1540  Basic metabolic panel  Once-Timed,   TIMED        11/12/22 1136          Data Reviewed: I have personally reviewed following labs and imaging studies. Recent Labs  Lab 11/06/22 0456  WBC 4.3  HGB 9.2*  HCT 29.8*  MCV 98.7  PLT 245  Basic Metabolic Panel: Recent Labs  Lab 11/06/22 0456 11/06/22 1558 11/07/22 0739 11/08/22 0937 11/09/22 0809 11/10/22 0645 11/11/22 0456 11/11/22 1850 11/12/22 0537  NA 137  --   --   --  137 137  --   --  138  K 2.5*   < > 2.8*   < > 3.0* 3.2* 2.8*  3.4* 3.2*  CL 104  --   --   --  106 101  --   --  101  CO2 25  --   --   --  24 26  --   --  24  GLUCOSE 93  --   --   --  94 98  --   --  90  BUN 8  --   --   --  6* 5*  --   --  5*  CREATININE 0.50  --   --   --  0.59 0.60  --   --  0.58  CALCIUM 8.6*  --   --   --  8.8* 9.1  --   --  8.9  MG 2.0  --  1.8  --   --   --  1.5*  --  1.6*   < > = values in this interval not displayed.  No results found for this or any previous visit (from the past 240 hour(s)).  Antimicrobials: Anti-infectives (From admission, onward)    None     Culture/Microbiology    Component Value Date/Time   SDES URINE, CLEAN CATCH 10/11/2022 1345   SPECREQUEST  10/11/2022 1345    NONE Performed at [facility], [address] [place], [place] [address]    CULT >=100,000 COLONIES/mL KLEBSIELLA PNEUMONIAE (A) 10/11/2022 1345   REPTSTATUS 10/13/2022 FINAL 10/11/2022 1345  Radiology Studies: No results found.  LOS: 12 days   Lanae Boast, MD [facility] 11/12/2022, 12:49 PM

## 2022-11-12 NOTE — Discharge Summary (Signed)
Physician Discharge Summary  Bay Kaszynski ONG:[id number] DOB: [birth year] DOA: 10/30/2022  PCP: Pcp, No  Admit date: 10/30/2022 Discharge date: 11/13/2022 Recommendations for Outpatient Follow-up:  Follow up with PCP/SNF with hospice  Discharge Dispo: SNF W/ HOSPICE Discharge Condition: Stable Code Status:   Code Status: Limited: Do not attempt resuscitation (DNR) -DNR-LIMITED -Do Not Intubate/DNI  Diet recommendation:  Diet Order             DIET - DYS 1 Room service appropriate? No; Fluid consistency: Thin  Diet effective now                   Brief/Interim Summary: Leslie Farley is a 89 y.o. female with a history of diastolic heart failure, hypertension, dementia currently on hospice.  Patient presented secondary to vomiting with coffee-ground emesis in addition to a severely distended abdomen.  On admission, patient was found to have evidence of a partial small obstruction with transition point in the terminal ileum.  Gastroenterology was consulted for coffee-ground emesis with decision made for conservative management secondary to goals of care.  General surgery was consulted for obstruction within assessment more consistent with likely pseudoobstruction/ileus.  Patient managed with suppositories with development of bowel movements, abdomen remains distended although having bowel movement.  Her abdomen appears softer and less distended 9/20 had a bowel movement, added lites electively stable. I called and daughter and udpated and she is agreeable for discharge.      Discharge Diagnoses:  Principal Problem:   Partial small bowel obstruction (HCC)  Pseudo-obstruction/ileus: Initial CT scan of the abdomen/pelvis was concerning for a partial bowel obstruction with transition point in the terminal ileum. Seen by CCSy as per their assessment this is consistent with pseudo-obstruction versus ileus. Patient managed w/ NG tube decompression and subsequently discontinued and on diet.  Seh will  continue on PR dulcolax suppository- cont dailyALOGN WITH MIRALAX AND STOOL SOFTENERS. Surgery signed off. SLP following for diet recommendation AND dys  1 diet  Chronic anemia  Coffee-ground emesis FOBT positive stool: GI consulted and decision was for conservative management. Hb stable,baseline around 9 gm, somewhat elevated on admission likely from hemoconcentration   Hypokalemia Hypomagnesemia: Repleted. Keep on kdur daily Aim to keep K>4 due to associated ileus/pseudoobstruction.  Recent Labs  Lab 11/07/22 0739 11/08/22 0937 11/09/22 0809 11/10/22 0645 11/11/22 0456 11/11/22 1850 11/12/22 0537 11/12/22 1545 11/13/22 0628  K 2.8*   < > 3.0* 3.2* 2.8* 3.4* 3.2* 3.4* 3.4*  CALCIUM  --   --  8.8* 9.1  --   --  8.9 9.0 9.2  MG 1.8  --   --   --  1.5*  --  1.6*  --   --    < > = values in this interval not displayed.   Dementia: Mentation fairly stable,alert awake-continue delirium precautions   Chronic diastolic heart failure. Stable.   Dysphagia: Dys 1 diet as per SLP   Ambulatory dysfunction Patient is bedbound    Primary hypertension PTA patient was on amlodipine.BP fairly stable on clonidine patch low-dose.    Class I Obesity:Patient's Body mass index is 31.88 kg/m.  Will benefit with PCP follow-up, weight loss  healthy lifestyle and outpatient sleep evaluation.  Goals of care: Patient is DNR DNI recently enrolled in hospice plan is to discharge back to SNF w/ hospice Plan of care discussed w/ Daughter  [name]. Of note is is high risk for readmission.  Consults: Gi Ccs  Subjective: Alert awake no complains of pain  Discharge Exam: Vitals:   11/13/22 0825 11/13/22 0953  BP:  (!) 97/50  Pulse:  66  Resp:    Temp:    SpO2: 99% 93%   General: Pt is alert, awake, not in acute distress Cardiovascular: RRR, S1/S2 +, no rubs, no gallops Respiratory: CTA bilaterally, no wheezing, no rhonchi Abdominal: Soft, NT,Distended, bowel sounds  + Extremities: no edema, no cyanosis  Discharge Instructions  Discharge Instructions     Discharge wound care:   Complete by: As directed    Clean sacrum/buttocks/coccyx with soap and water and dry thoroughly. Apply Gerhardt's Butt Cream to area 2 times daily and prn soiling.  May cover with silicone foam or ABD pad whichever is preferred      Allergies as of 11/13/2022       Reactions   Cucumber Extract Other (See Comments)   "Allergic," per [facility]        Medication List     STOP taking these medications    amLODipine 5 MG tablet Commonly known as: NORVASC   CERTAVITE/ANTIOXIDANTS PO   famotidine 20 MG tablet Commonly known as: PEPCID   feeding supplement (PRO-STAT SUGAR FREE 64) Liqd   FP FIBER LAXATIVE PO   magnesium hydroxide 400 MG/5ML suspension Commonly known as: MILK OF MAGNESIA   QUEtiapine 25 MG tablet Commonly known as: SEROQUEL   tiZANidine 2 MG tablet Commonly known as: ZANAFLEX       TAKE these medications    acetaminophen 500 MG tablet Commonly known as: TYLENOL Take 2 tablets (1,000 mg total) by mouth every 6 (six) hours as needed for mild pain or moderate pain.   Advair Diskus 100-50 MCG/ACT Aepb Generic drug: fluticasone-salmeterol Inhale 1 puff into the lungs See admin instructions. Inhale 1 puff into the lungs at 8 AM and 8 PM   albuterol 108 (90 Base) MCG/ACT inhaler Commonly known as: VENTOLIN HFA Inhale 2 puffs into the lungs every 6 (six) hours as needed for wheezing or shortness of breath.   bisacodyl 10 MG suppository Commonly known as: DULCOLAX Place 1 suppository (10 mg total) rectally daily. Start taking on: November 14, 2022 What changed:  when to take this reasons to take this   cloNIDine 0.1 mg/24hr patch Commonly known as: CATAPRES - Dosed in mg/24 hr Place 1 patch (0.1 mg total) onto the skin once a week. Start taking on: November 16, 2022   cycloSPORINE 0.05 % ophthalmic emulsion Commonly known as:  RESTASIS Place 1 drop into both eyes 2 (two) times daily.   ipratropium-albuterol 0.5-2.5 (3) MG/3ML Soln Commonly known as: DUONEB Take 3 mLs by nebulization See admin instructions. Nebulize the contents of one vial (3 ml's) and inhale into the lungs at 8 AM and 8 PM and every 6 hours as needed for shortness of breath or wheezing   melatonin 3 MG Tabs tablet Take 6 mg by mouth at bedtime.   metoCLOPramide 5 MG tablet Commonly known as: Reglan Take 1 tablet (5 mg total) by mouth 3 (three) times daily for 7 days.   nitroGLYCERIN 0.4 MG SL tablet Commonly known as: NITROSTAT Place 0.4 mg under the tongue every 5 (five) minutes x 3 doses as needed for chest pain (Do not exceed 3 doses).   polyethylene glycol 17 g packet Commonly known as: MIRALAX / GLYCOLAX Take 17 g by mouth daily. Start taking on: November 14, 2022 What changed:  when to take this additional instructions   potassium chloride SA 20 MEQ tablet Commonly known  asJerene Dilling Take 2 tablets (40 mEq total) by mouth daily.   RA Saline Enema 19-7 GM/118ML Enem Place 1 enema rectally daily as needed (for constipation not relieved by bisacodyl suppository- call MD if no relief from enema).   senna-docusate 8.6-50 MG tablet Commonly known as: Senokot-S Take 2 tablets by mouth at bedtime.   Systane Complete 0.6 % Soln Generic drug: Propylene Glycol Place 1 drop into both eyes in the morning and at bedtime.   Systane Balance 0.6 % Soln Generic drug: Propylene Glycol Place 1 drop into both eyes in the morning and at bedtime.   Vitamin D 50 MCG (2000 UT) tablet Take 2,000 Units by mouth in the morning.   Z-Bum 22 % Crea Generic drug: Zinc Oxide Apply 1 Application topically See admin instructions. Apply to inner thighs and perineal area twice a day.               Discharge Care Instructions  (From admission, onward)           Start     Ordered   11/13/22 0000  Discharge wound care:        Comments: Clean sacrum/buttocks/coccyx with soap and water and dry thoroughly. Apply Gerhardt's Butt Cream to area 2 times daily and prn soiling.  May cover with silicone foam or ABD pad whichever is preferred   11/13/22 1309            Contact information for after-discharge care     Destination     [facility] Preferred SNF .   Service: Skilled Nursing Contact information: [address] [place] [address] [phone]                    Allergies  Allergen Reactions   Cucumber Extract Other (See Comments)    "Allergic," per [facility]    The results of significant diagnostics from this hospitalization (including imaging, microbiology, ancillary and laboratory) are listed below for reference.    Microbiology: No results found for this or any previous visit (from the past 240 hour(s)).  Procedures/Studies: DG Abd Portable 1V  Result Date: 11/08/2022 CLINICAL DATA:  Abdominal distention. EXAM: PORTABLE ABDOMEN - 1 VIEW COMPARISON:  Abdominal radiograph dated 11/02/2022. FINDINGS: Multiple gas-filled loops of small and large bowel throughout the abdomen, similar in appearance to 11/02/2022. Air-fluid levels and free intraperitoneal air can not be excluded on the supine exam. No definite gas overlies the rectum. Degenerative changes are seen in the spine. IMPRESSION: Multiple gas-filled loops of small and large bowel throughout the abdomen, similar in appearance to 11/02/2022. Electronically Signed   By: Romona Curls M.D.   On: 11/08/2022 10:08   DG Abd Portable 1V-Small Bowel Obstruction Protocol-24 hr delay  Result Date: 11/02/2022 CLINICAL DATA:  Small bowel obstruction.  24 hour delayed images. EXAM: PORTABLE ABDOMEN - 1 VIEW COMPARISON:  11/01/2022 11:29 p.m. FINDINGS: Nasogastric tube unchanged with tip over the right upper quadrant likely over the distal stomach or proximal duodenum. There multiple air-filled large and small bowel loops  without significant change. Contrast is present within the colon, mostly over the rectosigmoid colon. Findings suggest ileus. Remainder the exam is unchanged. IMPRESSION: Multiple air-filled large and small bowel loops without significant change with contrast present within the colon, mostly over the rectosigmoid colon. Findings suggest ileus. Electronically Signed   By: Elberta Fortis M.D.   On: 11/02/2022 14:45   DG Abd Portable 1V  Result Date: 11/02/2022 CLINICAL  DATA:  Nasogastric tube placement EXAM: PORTABLE ABDOMEN - 1 VIEW COMPARISON:  11/01/2022 at 6:58 p.m. FINDINGS: Tip and side port of the nasogastric tube project within the distal stomach. Diffuse gas distended colon and small bowel throughout the abdomen. IMPRESSION: Tip and side port of the nasogastric tube project within the distal stomach. Electronically Signed   By: Deatra Robinson M.D.   On: 11/02/2022 00:07   DG Abd Portable 1V-Small Bowel Obstruction Protocol-initial, 8 hr delay  Result Date: 11/01/2022 CLINICAL DATA:  Small-bowel obstruction.  8 hour delay. EXAM: PORTABLE ABDOMEN - 1 VIEW COMPARISON:  Abdominal radiograph dated 10/31/2022. FINDINGS: Enteric tube with tip and side port in the epigastric area in the region of the GE junction. Recommend further advancing for optimal positioning. There is diffuse dilatation of small bowel with probable air distention of the colon. Oral contrast noted in the proximal colon as well as within the rectum. IMPRESSION: 1. Enteric tube with tip and side port in the region of the GE junction. Recommend further advancing for optimal positioning. 2. Diffuse dilatation of small bowel with oral contrast in the proximal colon and rectum. Findings favored to represent an ileus rather than a small bowel obstruction. Clinical correlation is recommended. Electronically Signed   By: Elgie Collard M.D.   On: 11/01/2022 23:02   DG Abd Portable 1V-Small Bowel Obstruction Protocol-initial, 8 hr delay  Result  Date: 11/01/2022 CLINICAL DATA:  8 hour delay small-bowel obstruction protocol. EXAM: PORTABLE ABDOMEN - 1 VIEW COMPARISON:  CT abdomen and pelvis 10/31/2022 FINDINGS: No significant oral contrast identified within small bowel or colon. Diffusely dilated small bowel loops are present measuring up to 4 cm. Contrast is seen in the bladder. IMPRESSION: 1. Diffusely dilated small bowel again seen. No significant oral contrast identified in small bowel or colon. Findings are concerning for severe ileus or small-bowel obstruction. Electronically Signed   By: Darliss Cheney M.D.   On: 11/01/2022 00:17   DG Abd Portable 1 View  Result Date: 10/31/2022 CLINICAL DATA:  89 year old female status post nasogastric tube placement. EXAM: PORTABLE ABDOMEN - 1 VIEW COMPARISON:  Abdominal radiograph 10/13/2022. FINDINGS: Nasogastric tube in position with tip in the mid body of the stomach and side port just distal to the gastroesophageal junction. Visualized portions of the upper abdomen again demonstrate numerous gas-filled loops of bowel, some of which appear dilated (poorly evaluated on this single view examination which excludes the majority of the lower 2/3 of the abdomen). IMPRESSION: 1. Tip of nasogastric tube is in the body of the stomach with side port just distal to the gastroesophageal junction. 2. Persistent bowel dilatation, likely reflective of small bowel obstruction as demonstrated on contemporaneously obtained CT of the abdomen and pelvis. Electronically Signed   By: Trudie Reed M.D.   On: 10/31/2022 06:06   CT ABDOMEN PELVIS W CONTRAST  Result Date: 10/31/2022 CLINICAL DATA:  Cough for ground emesis and abdominal distension. EXAM: CT ABDOMEN AND PELVIS WITH CONTRAST TECHNIQUE: Multidetector CT imaging of the abdomen and pelvis was performed using the standard protocol following bolus administration of intravenous contrast. RADIATION DOSE REDUCTION: This exam was performed according to the departmental  dose-optimization program which includes automated exposure control, adjustment of the mA and/or kV according to patient size and/or use of iterative reconstruction technique. CONTRAST:  OMNIPAQUE IOHEXOL 300 MG/ML  SOLN COMPARISON:  October 13, 2022 FINDINGS: Lower chest: There is a small right pleural effusion. This is mildly increased in size when compared to the  prior exam. Hepatobiliary: No focal liver abnormality is seen. Status post cholecystectomy. No biliary dilatation. Pancreas: Unremarkable. No pancreatic ductal dilatation or surrounding inflammatory changes. Spleen: Normal in size without focal abnormality. Adrenals/Urinary Tract: Adrenal glands are unremarkable. Kidneys are normal, without renal calculi, focal lesion, or hydronephrosis. The urinary bladder is poorly distended and subsequently limited in evaluation. Stomach/Bowel: Stomach is within normal limits. The appendix is normal. Numerous dilated small bowel loops are seen throughout the abdomen and pelvis (maximum small bowel diameter of approximately 4.3 cm). A transition zone is seen at the level of the terminal ileum (axial CT images 32 through 34, CT series 4). Noninflamed diverticula are seen throughout the large bowel. Persistently distended mid to distal sigmoid colon and rectum is noted. Vascular/Lymphatic: Aortic atherosclerosis. No enlarged abdominal or pelvic lymph nodes. Reproductive: Uterus and bilateral adnexa are unremarkable. Other: No abdominal wall hernia or abnormality. No abdominopelvic ascites. Musculoskeletal: Multilevel chronic and degenerative changes seen throughout the lumbar spine. IMPRESSION: 1. Findings consistent with a partial small bowel obstruction with a transition zone at the level of the terminal ileum. 2. Colonic diverticulosis. 3. Small right pleural effusion. 4. Aortic atherosclerosis. Aortic Atherosclerosis (ICD10-I70.0). Electronically Signed   By: Aram Candela M.D.   On: 10/31/2022 03:24   DG  Chest 1 View  Result Date: 10/31/2022 CLINICAL DATA:  Coffee brown emesis and abdominal distension. EXAM: CHEST  1 VIEW COMPARISON:  October 13, 2022 FINDINGS: The heart size and mediastinal contours are within normal limits. There is moderate severity calcification of the aortic arch. Both lungs are clear. Degenerative changes are seen involving the right shoulder and throughout the thoracic spine. IMPRESSION: No active disease. Electronically Signed   By: Aram Candela M.D.   On: 10/31/2022 03:16    Labs: BNP (last 3 results) Recent Labs    10/14/22 0053 10/15/22 0235 10/16/22 0711  BNP 128.8* 125.2* 114.5*   Basic Metabolic Panel: Recent Labs  Lab 11/07/22 0739 11/08/22 0937 11/09/22 0809 11/10/22 0645 11/11/22 0456 11/11/22 1850 11/12/22 0537 11/12/22 1545 11/13/22 0628  NA  --   --  137 137  --   --  138 138 138  K 2.8*   < > 3.0* 3.2* 2.8* 3.4* 3.2* 3.4* 3.4*  CL  --   --  106 101  --   --  101 102 102  CO2  --   --  24 26  --   --  24 26 26   GLUCOSE  --   --  94 98  --   --  90 104* 86  BUN  --   --  6* 5*  --   --  5* 5* 6*  CREATININE  --   --  0.59 0.60  --   --  0.58 0.53 0.51  CALCIUM  --   --  8.8* 9.1  --   --  8.9 9.0 9.2  MG 1.8  --   --   --  1.5*  --  1.6*  --   --    < > = values in this interval not displayed.  Urinalysis    Component Value Date/Time   COLORURINE YELLOW 10/31/2022 0235   APPEARANCEUR CLEAR 10/31/2022 0235   LABSPEC 1.027 10/31/2022 0235   PHURINE 5.0 10/31/2022 0235   GLUCOSEU NEGATIVE 10/31/2022 0235   HGBUR NEGATIVE 10/31/2022 0235   BILIRUBINUR NEGATIVE 10/31/2022 0235   KETONESUR NEGATIVE 10/31/2022 0235   PROTEINUR 30 (A) 10/31/2022 0235   UROBILINOGEN 0.2  11/02/2014 0832   NITRITE NEGATIVE 10/31/2022 0235   LEUKOCYTESUR NEGATIVE 10/31/2022 0235   Sepsis Labs No results for input(s): "WBC" in the last 168 hours.  Invalid input(s): "PROCALCITONIN", "LACTICIDVEN"  Microbiology No results found for this or any previous  visit (from the past 240 hour(s)). Time coordinating discharge: 25 minutes  SIGNED: Lanae Boast, MD  [facility] 11/13/2022, 1:09 PM  If 7PM-7AM, please contact night-coverage [email or web address]

## 2022-11-12 NOTE — Progress Notes (Signed)
[facility] Patient Visit Leslie Farley is a current hospice patient, start of care 8.29.24 with hospice diagnosos of COPD, followed at [facility] who was admitted to [facility] on 9.7.24 with primary diagnosis of small bowel obstruction. Per Dr. Gordy Savers, hospice physician, this is a related hospital admission.  Visited with patient in hospital. She is resting quietly, appears to be sleeping without distress. Staff report she took all am meds including miralax. Noted potassium has improved with AM lab results, however it is still low and magnesium has dropped as well. Patient is received IV and po replacement of electrolytes. No change in plan for patient to discharge back to [facility] when medically ready.  Patient remains GIP appropriate due to need for IV protonix, IV Reglan, electrolyte replacement. Vital Signs: 97.7/71/18     O2 99% on RA I&O:  60/100 Abnormal labs: KCL 3.2, BUN 5, Mag 1.6 Diagnostics: none new IV/PRN Meds: protonix 40 meq IV BID, Reglan 5mg  IV every 6 hours, Potassium 40 meq po x 1, Potassium IV x3, magnesium sulfate 2G IV x 1. Problem List:  Pseudo-obstruction/ileus: Initial CT scan of the abdomen/pelvis was concerning for a partial bowel obstruction with transition point in the terminal ileum. Seen by CCSy as per their assessment this is consistent with pseudo-obstruction versus ileus. Patient managed w/ NG tube decompression, Reglan IV fluids,dulcolax suppository- cont daily suppository.. Surgery signed off. SLP following for diet recommendation.     Hypokalemia Hypomagnesemia: Repleting again iv and po,recheck today Aim to keep K>4 due to associated ileus/pseudoobstruction.   Dementia: Mentation fairly stable,alert awake-continue delirium precautions  Discharge Planning: Ongoing, plan to return to [facility] upon Discharge with [facility], likely tomorrow once Potassium stabilized. Family Contact:  Communicated with daughter by phone. IDT: Updated Goals of Care: DNR Should patient need ambulance transfer at discharge- please use [facility]) as they contract this service for our active hospice patients.  Glenna Fellows BSN, RN, [facility] hospital liaison [phone]

## 2022-11-13 DIAGNOSIS — K56600 Partial intestinal obstruction, unspecified as to cause: Secondary | ICD-10-CM | POA: Diagnosis not present

## 2022-11-13 LAB — BASIC METABOLIC PANEL
Anion gap: 10 (ref 5–15)
BUN: 6 mg/dL — ABNORMAL LOW (ref 8–23)
CO2: 26 mmol/L (ref 22–32)
Calcium: 9.2 mg/dL (ref 8.9–10.3)
Chloride: 102 mmol/L (ref 98–111)
Creatinine, Ser: 0.51 mg/dL (ref 0.44–1.00)
GFR, Estimated: >60 mL/min (ref >60)
Glucose, Bld: 86 mg/dL (ref 70–99)
Potassium: 3.4 mmol/L — ABNORMAL LOW (ref 3.5–5.1)
Sodium: 138 mmol/L (ref 135–145)

## 2022-11-13 MED ORDER — CLONIDINE 0.1 MG/24HR TD PTWK: 0.1000 mg | MEDICATED_PATCH | TRANSDERMAL | Status: AC

## 2022-11-13 MED ORDER — BISACODYL 10 MG RE SUPP: 10.0000 mg | Freq: Every day | RECTAL | Status: AC

## 2022-11-13 MED ORDER — POLYETHYLENE GLYCOL 3350 17 G PO PACK: 17.0000 g | PACK | Freq: Every day | ORAL | 0 refills | Status: AC

## 2022-11-13 MED ORDER — SYSTANE BALANCE 0.6 % OP SOLN: 1.0000 [drp] | Freq: Two times a day (BID) | OPHTHALMIC | Status: AC

## 2022-11-13 MED ORDER — POTASSIUM CHLORIDE CRYS ER 20 MEQ PO TBCR: 40.0000 meq | EXTENDED_RELEASE_TABLET | Freq: Every day | ORAL | Status: DC

## 2022-11-13 MED ORDER — POTASSIUM CHLORIDE CRYS ER 20 MEQ PO TBCR
40.0000 meq | EXTENDED_RELEASE_TABLET | Freq: Every day | ORAL | Status: DC
  Administered 2022-11-13: 40 meq via ORAL
  Filled 2022-11-13: qty 2

## 2022-11-13 MED ORDER — POTASSIUM CHLORIDE 10 MEQ/100ML IV SOLN
10.0000 meq | INTRAVENOUS | Status: AC
  Administered 2022-11-13 (×2): 10 meq via INTRAVENOUS
  Filled 2022-11-13: qty 100

## 2022-11-13 MED ORDER — METOCLOPRAMIDE HCL 5 MG PO TABS: 5.0000 mg | ORAL_TABLET | Freq: Three times a day (TID) | ORAL | Status: DC

## 2022-11-13 NOTE — Progress Notes (Signed)
SLP Cancellation Note  Patient Details Name: Leslie Farley MRN: [id number] DOB: 07/21/1930   Cancelled treatment:       Reason Eval/Treat Not Completed: Other (comment) (pt dc'd back to facility today with hospice, will sign off)   Chales Abrahams 11/13/2022, 2:03 PM

## 2022-11-13 NOTE — TOC Transition Note (Signed)
Transition of Care [facility]) - CM/SW Discharge Note  Patient Details  Name: Leslie Farley MRN: [id number] Date of Birth: [birth year]  Transition of Care [facility]) CM/SW Contact:  Ewing Schlein, LCSW Phone Number: 11/13/2022, 2:04 PM  Clinical Narrative: Patient medically ready to discharge back to [facility]. FL2 completed. Discharge summary, discharge orders, SNF transfer report, and FL2 faxed to facility in hub. Medical necessity form done; [facility] scheduled. Discharge packet completed. CSW notified daughter of discharge and transportation being set up. RN updated. TOC signing off.   Final next level of care: Skilled Nursing Facility Barriers to Discharge: Barriers Resolved  Patient Goals and CMS Choice CMS Medicare.gov Compare Post Acute Care list provided to:: Patient Represenative (must comment) [name] (daughter)) Choice offered to / list presented to : Adult Children  Discharge Placement      Patient chooses bed at: [facility] Patient to be transferred to facility by: [facility] Name of family member notified: [name] (daughter) Ph: [phone] Patient and family notified of of transfer: 11/13/22  Discharge Plan and Services Additional resources added to the After Visit Summary for   In-house Referral: Clinical Social Work Post Acute Care Choice: Nursing Home, Hospice          DME Arranged: N/A DME Agency: NA  Social Determinants of Health (SDOH) Interventions SDOH Screenings   Food Insecurity: Patient Unable To Answer (11/10/2022)  Housing: Patient Unable To Answer (11/10/2022)  Transportation Needs: Patient Unable To Answer (11/10/2022)  Utilities: Patient Unable To Answer (11/10/2022)  Tobacco Use: Medium Risk (11/03/2022)   Readmission Risk Interventions    11/02/2022    9:28 AM 03/31/2022    1:08 PM  Readmission Risk Prevention Plan  Post Dischage Appt  Complete  Medication Screening  Complete  Transportation Screening Complete Complete   Medication Review (RN Care Manager) Complete   HRI or Home Care Consult Complete   SW Recovery Care/Counseling Consult Complete   Palliative Care Screening Complete   Skilled Nursing Facility Complete

## 2022-11-13 NOTE — [place] FL2 (Signed)
[place] MEDICAID FL2 LEVEL OF CARE FORM     IDENTIFICATION  Patient Name: Leslie Farley Birthdate: 1930/02/26 Sex: female Admission Date (Current Location): 10/30/2022  [place] and [place] Number:  Haynes Bast [id number] O Facility and Address:  [facility],  [address], [place] [address]      Provider Number: [id number]  Attending Physician Name and Address:  Lanae Boast, MD  Relative Name and Phone Number:  [name] (daughter) Ph: [phone]    Current Level of Care: Hospital Recommended Level of Care: Nursing Facility Prior Approval Number:    Date Approved/Denied:   PASRR Number: [id number]  Discharge Plan: SN[facility] with hospice through [facility])    Current Diagnoses: Patient Active Problem List   Diagnosis Date Noted   Partial small bowel obstruction (HCC) 10/31/2022   Acute metabolic encephalopathy 10/11/2022   Urinary tract infection 10/11/2022   Hypokalemia 10/11/2022   Normocytic anemia 10/11/2022   Dementia (HCC) 09/22/2022   Coordination of complex care 09/22/2022   Palliative care encounter 09/20/2022   Goals of care, counseling/discussion 09/20/2022   Abdominal pain 09/16/2022   Leukocytosis 09/16/2022   AKI (acute kidney injury) (HCC) 09/16/2022   Elevated transaminase level 09/16/2022   Ileus (HCC) 09/16/2022   Stercoral colitis 03/29/2022   Fecal impaction (HCC) 03/29/2022   Cognitive impairment 03/29/2022   Chronic bronchitis (HCC) 01/15/2016   Insomnia 01/15/2016   Constipation 01/15/2016   CAD (coronary artery disease) of artery bypass graft 01/15/2016   Benign paroxysmal positional vertigo 01/15/2016   History of pulmonary embolism 01/14/2016   Peripheral neuropathy 06/26/2015   Chronic constipation 05/10/2015   COPD (chronic obstructive pulmonary disease) (HCC) 03/01/2015   Acute encephalopathy 11/10/2014   Right thyroid nodule 11/02/2014   Hypertension    GERD (gastroesophageal reflux  disease)    Arthritis    Essential hypertension     Orientation RESPIRATION BLADDER Height & Weight     Self  Normal Incontinent Weight: 191 lb 9.3 oz (86.9 kg) Height:  5\' 5"  (165.1 cm)  BEHAVIORAL SYMPTOMS/MOOD NEUROLOGICAL BOWEL NUTRITION STATUS      Incontinent Diet (Dysphagia 1 diet)  AMBULATORY STATUS COMMUNICATION OF NEEDS Skin   Extensive Assist Verbally Skin abrasions, Other (Comment) (Abrasions & Erythema: buttocks)                       Personal Care Assistance Level of Assistance  Bathing, Feeding, Dressing Bathing Assistance: Maximum assistance Feeding assistance: Maximum assistance Dressing Assistance: Maximum assistance     Functional Limitations Info  Sight, Hearing, Speech Sight Info: Adequate Hearing Info: Adequate Speech Info: Adequate    SPECIAL CARE FACTORS FREQUENCY                       Contractures Contractures Info: Not present    Additional Factors Info  Code Status, Allergies, Psychotropic Code Status Info: DNR Allergies Info: Cucumber extract Psychotropic Info: See discharge summary         Current Medications (11/13/2022):  This is the current hospital active medication list Current Facility-Administered Medications  Medication Dose Route Frequency Provider Last Rate Last Admin   albuterol (VENTOLIN HFA) 108 (90 Base) MCG/ACT inhaler 2 puff  2 puff Inhalation Q6H PRN Kc, Ramesh, MD       bisacodyl (DULCOLAX) suppository 10 mg  10 mg Rectal Daily Phylliss Blakes A, MD   10 mg at 11/13/22 0916   cloNIDine (CATAPRES - Dosed in mg/24 hr) patch 0.1  mg  0.1 mg Transdermal Weekly Kc, Ramesh, MD   0.1 mg at 11/09/22 5621   cycloSPORINE (RESTASIS) 0.05 % ophthalmic emulsion 1 drop  1 drop Both Eyes BID Kc, Ramesh, MD   1 drop at 11/13/22 0919   docusate sodium (COLACE) capsule 100 mg  100 mg Oral BID Meuth, Brooke A, PA-C   100 mg at 11/13/22 0915   Gerhardt's butt cream   Topical BID Lanae Boast, MD   Given at 11/13/22 0919    hydrALAZINE (APRESOLINE) injection 5-10 mg  5-10 mg Intravenous Q6H PRN Kc, Dayna Barker, MD   5 mg at 11/07/22 0616   ipratropium-albuterol (DUONEB) 0.5-2.5 (3) MG/3ML nebulizer solution 3 mL  3 mL Nebulization Q6H PRN Kc, Ramesh, MD       ipratropium-albuterol (DUONEB) 0.5-2.5 (3) MG/3ML nebulizer solution 3 mL  3 mL Nebulization BID Kc, Ramesh, MD   3 mL at 11/13/22 0825   labetalol (NORMODYNE) injection 5 mg  5 mg Intravenous Q4H PRN Kc, Dayna Barker, MD   5 mg at 11/02/22 1309   metoCLOPramide (REGLAN) injection 5 mg  5 mg Intravenous Q6H Meuth, Brooke A, PA-C   5 mg at 11/13/22 1229   mometasone-formoterol (DULERA) 100-5 MCG/ACT inhaler 2 puff  2 puff Inhalation BID Kc, Ramesh, MD   2 puff at 11/13/22 0825   nitroGLYCERIN (NITROSTAT) SL tablet 0.4 mg  0.4 mg Sublingual Q5 min PRN Kc, Ramesh, MD       pantoprazole (PROTONIX) injection 40 mg  40 mg Intravenous BID Hall, Carole N, DO   40 mg at 11/13/22 0915   polyethylene glycol (MIRALAX / GLYCOLAX) packet 17 g  17 g Oral Daily Meuth, Brooke A, PA-C   17 g at 11/13/22 0919   polyvinyl alcohol (LIQUIFILM TEARS) 1.4 % ophthalmic solution 1 drop  1 drop Both Eyes BID Kc, Ramesh, MD   1 drop at 11/13/22 0919   potassium chloride SA (KLOR-CON M) CR tablet 40 mEq  40 mEq Oral Daily Lanae Boast, MD   40 mEq at 11/13/22 0915     Discharge Medications: Please see discharge summary for a list of discharge medications.  Relevant Imaging Results:  Relevant Lab Results:   Additional Information SSN: [id number]. Hospice to continue at [facility].  Ewing Schlein, LCSW

## 2022-11-13 NOTE — Progress Notes (Signed)
Patient discharged to [facility], report given to receiving nurse

## 2022-11-20 ENCOUNTER — Emergency Department (HOSPITAL_COMMUNITY)

## 2022-11-20 ENCOUNTER — Other Ambulatory Visit: Payer: Self-pay

## 2022-11-20 ENCOUNTER — Encounter (HOSPITAL_COMMUNITY): Payer: Self-pay | Admitting: Internal Medicine

## 2022-11-20 ENCOUNTER — Inpatient Hospital Stay (HOSPITAL_COMMUNITY)
Admission: EM | Admit: 2022-11-20 | Discharge: 2022-11-23 | DRG: 389 | Disposition: A | Discharge status: Skilled Nursing Facility | Source: Skilled Nursing Facility | Attending: Internal Medicine | Admitting: Internal Medicine

## 2022-11-20 DIAGNOSIS — I110 Hypertensive heart disease with heart failure: Secondary | ICD-10-CM | POA: Diagnosis present

## 2022-11-20 DIAGNOSIS — I509 Heart failure, unspecified: Secondary | ICD-10-CM | POA: Diagnosis present

## 2022-11-20 DIAGNOSIS — Z7951 Long term (current) use of inhaled steroids: Secondary | ICD-10-CM | POA: Diagnosis not present

## 2022-11-20 DIAGNOSIS — Z808 Family history of malignant neoplasm of other organs or systems: Secondary | ICD-10-CM

## 2022-11-20 DIAGNOSIS — E878 Other disorders of electrolyte and fluid balance, not elsewhere classified: Secondary | ICD-10-CM | POA: Diagnosis present

## 2022-11-20 DIAGNOSIS — D62 Acute posthemorrhagic anemia: Secondary | ICD-10-CM | POA: Diagnosis present

## 2022-11-20 DIAGNOSIS — Z833 Family history of diabetes mellitus: Secondary | ICD-10-CM

## 2022-11-20 DIAGNOSIS — Z515 Encounter for palliative care: Secondary | ICD-10-CM

## 2022-11-20 DIAGNOSIS — J4489 Other specified chronic obstructive pulmonary disease: Secondary | ICD-10-CM | POA: Diagnosis present

## 2022-11-20 DIAGNOSIS — K5981 Ogilvie syndrome: Secondary | ICD-10-CM | POA: Diagnosis present

## 2022-11-20 DIAGNOSIS — Z79899 Other long term (current) drug therapy: Secondary | ICD-10-CM

## 2022-11-20 DIAGNOSIS — G4710 Hypersomnia, unspecified: Secondary | ICD-10-CM | POA: Diagnosis present

## 2022-11-20 DIAGNOSIS — F03918 Unspecified dementia, unspecified severity, with other behavioral disturbance: Secondary | ICD-10-CM | POA: Diagnosis present

## 2022-11-20 DIAGNOSIS — Z66 Do not resuscitate: Secondary | ICD-10-CM | POA: Diagnosis present

## 2022-11-20 DIAGNOSIS — Z87891 Personal history of nicotine dependence: Secondary | ICD-10-CM

## 2022-11-20 DIAGNOSIS — K567 Ileus, unspecified: Secondary | ICD-10-CM | POA: Diagnosis present

## 2022-11-20 DIAGNOSIS — Z933 Colostomy status: Secondary | ICD-10-CM | POA: Diagnosis not present

## 2022-11-20 DIAGNOSIS — I2510 Atherosclerotic heart disease of native coronary artery without angina pectoris: Secondary | ICD-10-CM | POA: Diagnosis present

## 2022-11-20 DIAGNOSIS — J449 Chronic obstructive pulmonary disease, unspecified: Secondary | ICD-10-CM | POA: Diagnosis present

## 2022-11-20 DIAGNOSIS — I10 Essential (primary) hypertension: Secondary | ICD-10-CM | POA: Diagnosis present

## 2022-11-20 DIAGNOSIS — F0390 Unspecified dementia without behavioral disturbance: Secondary | ICD-10-CM | POA: Diagnosis present

## 2022-11-20 DIAGNOSIS — Z7189 Other specified counseling: Secondary | ICD-10-CM

## 2022-11-20 LAB — CBC
HCT: 29.5 % — ABNORMAL LOW (ref 36.0–46.0)
Hemoglobin: 9.5 g/dL — ABNORMAL LOW (ref 12.0–15.0)
MCH: 30.7 pg (ref 26.0–34.0)
MCHC: 32.2 g/dL (ref 30.0–36.0)
MCV: 95.5 fL (ref 80.0–100.0)
Platelets: 194 10*3/uL (ref 150–400)
RBC: 3.09 MIL/uL — ABNORMAL LOW (ref 3.87–5.11)
RDW: 14.9 % (ref 11.5–15.5)
WBC: 9.2 10*3/uL (ref 4.0–10.5)
nRBC: 0 % (ref 0.0–0.2)

## 2022-11-20 LAB — COMPREHENSIVE METABOLIC PANEL
ALT: 16 U/L (ref 0–44)
AST: 23 U/L (ref 15–41)
Albumin: 3.2 g/dL — ABNORMAL LOW (ref 3.5–5.0)
Alkaline Phosphatase: 54 U/L (ref 38–126)
Anion gap: 10 (ref 5–15)
BUN: 36 mg/dL — ABNORMAL HIGH (ref 8–23)
CO2: 31 mmol/L (ref 22–32)
Calcium: 9.5 mg/dL (ref 8.9–10.3)
Chloride: 95 mmol/L — ABNORMAL LOW (ref 98–111)
Creatinine, Ser: 0.85 mg/dL (ref 0.44–1.00)
GFR, Estimated: >60 mL/min (ref >60)
Glucose, Bld: 143 mg/dL — ABNORMAL HIGH (ref 70–99)
Potassium: 4.4 mmol/L (ref 3.5–5.1)
Sodium: 136 mmol/L (ref 135–145)
Total Bilirubin: 0.5 mg/dL (ref 0.3–1.2)
Total Protein: 6.5 g/dL (ref 6.5–8.1)

## 2022-11-20 LAB — LIPASE, BLOOD: Lipase: 21 U/L (ref 11–51)

## 2022-11-20 LAB — AMMONIA: Ammonia: 25 umol/L (ref 9–35)

## 2022-11-20 MED ORDER — MORPHINE SULFATE (PF) 4 MG/ML IV SOLN
4.0000 mg | Freq: Once | INTRAVENOUS | Status: AC
  Administered 2022-11-20: 4 mg via INTRAVENOUS
  Filled 2022-11-20: qty 1

## 2022-11-20 MED ORDER — CLONIDINE HCL 0.1 MG/24HR TD PTWK
0.1000 mg | MEDICATED_PATCH | TRANSDERMAL | Status: DC
  Administered 2022-11-23: 0.1 mg via TRANSDERMAL
  Filled 2022-11-20: qty 1

## 2022-11-20 MED ORDER — ACETAMINOPHEN 325 MG PO TABS: 650.0000 mg | ORAL_TABLET | Freq: Four times a day (QID) | ORAL | Status: DC | PRN

## 2022-11-20 MED ORDER — ONDANSETRON HCL 4 MG/2ML IJ SOLN
4.0000 mg | Freq: Once | INTRAMUSCULAR | Status: AC
  Administered 2022-11-20: 4 mg via INTRAVENOUS
  Filled 2022-11-20: qty 2

## 2022-11-20 MED ORDER — BISACODYL 10 MG RE SUPP
10.0000 mg | Freq: Every day | RECTAL | Status: DC
  Administered 2022-11-20 – 2022-11-23 (×4): 10 mg via RECTAL
  Filled 2022-11-20 (×4): qty 1

## 2022-11-20 MED ORDER — ALBUTEROL SULFATE (2.5 MG/3ML) 0.083% IN NEBU: 2.5000 mg | INHALATION_SOLUTION | RESPIRATORY_TRACT | Status: DC | PRN

## 2022-11-20 MED ORDER — ACETAMINOPHEN 650 MG RE SUPP: 650.0000 mg | Freq: Four times a day (QID) | RECTAL | Status: DC | PRN

## 2022-11-20 MED ORDER — HYDROMORPHONE HCL 1 MG/ML IJ SOLN: 0.5000 mg | INTRAMUSCULAR | Status: DC | PRN

## 2022-11-20 MED ORDER — SENNOSIDES-DOCUSATE SODIUM 8.6-50 MG PO TABS
2.0000 | ORAL_TABLET | Freq: Every day | ORAL | Status: DC
  Administered 2022-11-22: 2 via ORAL
  Filled 2022-11-20: qty 2

## 2022-11-20 MED ORDER — TRAZODONE HCL 50 MG PO TABS: 25.0000 mg | ORAL_TABLET | Freq: Every evening | ORAL | Status: DC | PRN

## 2022-11-20 MED ORDER — MOMETASONE FURO-FORMOTEROL FUM 100-5 MCG/ACT IN AERO
2.0000 | INHALATION_SPRAY | Freq: Two times a day (BID) | RESPIRATORY_TRACT | Status: DC
  Administered 2022-11-20: 2 via RESPIRATORY_TRACT
  Filled 2022-11-20: qty 8.8

## 2022-11-20 MED ORDER — IOHEXOL 300 MG/ML  SOLN
100.0000 mL | Freq: Once | INTRAMUSCULAR | Status: AC | PRN
  Administered 2022-11-20: 100 mL via INTRAVENOUS

## 2022-11-20 MED ORDER — SODIUM CHLORIDE (PF) 0.9 % IJ SOLN
INTRAMUSCULAR | Status: AC
  Filled 2022-11-20: qty 50

## 2022-11-20 MED ORDER — SODIUM CHLORIDE 0.9 % IV SOLN: INTRAVENOUS | Status: DC

## 2022-11-20 MED ORDER — ENOXAPARIN SODIUM 40 MG/0.4ML IJ SOSY: 40.0000 mg | PREFILLED_SYRINGE | INTRAMUSCULAR | Status: DC

## 2022-11-20 NOTE — H&P (Signed)
History and Physical  Leslie Farley XBJ:[id number] DOB: December 13, 1930 DOA: 11/20/2022  PCP: Pcp, No   Chief Complaint: Brought from [facility], complains of abdominal distention  HPI: Leslie Farley is a 89 y.o. female with medical history significant for advanced dementia, COPD on room air, GERD, hypertension recent hospital stay 1 week ago for partial small bowel obstruction being admitted to the hospital with concerns for abdominal distention and recurrent vomiting.  No history is currently available, as there is no family at the bedside, and there was essentially no report from her nursing home.  Here the patient is nonverbal, not able to give any history, which is consistent with her known baseline of dementia.  She is on hospice services at her nursing facility.  Initially on arrival to the emergency department, she was hypertensive but vital signs have now normalized.  Lab work including CBC and CMP stable and unremarkable, hemoglobin 9.5 at baseline.  CT scan was done as noted below, shows significant colonic distention.  ER provider discussed with general surgery who will consult, hospitalist was contacted for admission.  Review of Systems: Please see HPI for pertinent positives and negatives. A complete review of systems could not be performed due to the patient's advanced dementia.  Past Medical History:  Diagnosis Date   Arthritis    Asthma    COPD (chronic obstructive pulmonary disease) (HCC)    GERD (gastroesophageal reflux disease)    Hypertension    Past Surgical History:  Procedure Laterality Date   CHOLECYSTECTOMY      Social History:  reports that she quit smoking about 51 years ago. She has never used smokeless tobacco. She reports that she does not drink alcohol and does not use drugs.   Allergies  Allergen Reactions   Cucumber Extract Other (See Comments)    "Allergic," per [facility]    Family History  Problem Relation Age of Onset   Cancer -  Other Mother        Died of throat cancer   Cancer - Prostate Father    Diabetes Brother      Prior to Admission medications   Medication Sig Start Date End Date Taking? Authorizing Provider  acetaminophen (TYLENOL) 500 MG tablet Take 2 tablets (1,000 mg total) by mouth every 6 (six) hours as needed for mild pain or moderate pain. 11/20/16   Elson Areas, PA-C  ADVAIR DISKUS 100-50 MCG/ACT AEPB Inhale 1 puff into the lungs See admin instructions. Inhale 1 puff into the lungs at 8 AM and 8 PM 03/26/22   [provider]  albuterol (VENTOLIN HFA) 108 (90 Base) MCG/ACT inhaler Inhale 2 puffs into the lungs every 6 (six) hours as needed for wheezing or shortness of breath. 04/01/22   Pokhrel, Rebekah Chesterfield, MD  bisacodyl (DULCOLAX) 10 MG suppository Place 1 suppository (10 mg total) rectally daily. 11/14/22   Lanae Boast, MD  Cholecalciferol (VITAMIN D) 50 MCG (2000 UT) tablet Take 2,000 Units by mouth in the morning.    [provider]  cloNIDine (CATAPRES - DOSED IN MG/24 HR) 0.1 mg/24hr patch Place 1 patch (0.1 mg total) onto the skin once a week. 11/16/22   Lanae Boast, MD  cycloSPORINE (RESTASIS) 0.05 % ophthalmic emulsion Place 1 drop into both eyes 2 (two) times daily.    [provider]  ipratropium-albuterol (DUONEB) 0.5-2.5 (3) MG/3ML SOLN Take 3 mLs by nebulization See admin instructions. Nebulize the contents of one vial (3 ml's) and inhale into the lungs at  8 AM and 8 PM and every 6 hours as needed for shortness of breath or wheezing    [provider]  melatonin 3 MG TABS tablet Take 6 mg by mouth at bedtime.    [provider]  metoCLOPramide (REGLAN) 5 MG tablet Take 1 tablet (5 mg total) by mouth 3 (three) times daily for 7 days. 11/13/22 11/20/22  Lanae Boast, MD  nitroGLYCERIN (NITROSTAT) 0.4 MG SL tablet Place 0.4 mg under the tongue every 5 (five) minutes x 3 doses as needed for chest pain (Do not exceed 3 doses).    [provider]   polyethylene glycol (MIRALAX / GLYCOLAX) 17 g packet Take 17 g by mouth daily. 11/14/22   Lanae Boast, MD  potassium chloride SA (KLOR-CON M) 20 MEQ tablet Take 2 tablets (40 mEq total) by mouth daily. 11/13/22   Lanae Boast, MD  Propylene Glycol (SYSTANE BALANCE) 0.6 % SOLN Place 1 drop into both eyes in the morning and at bedtime. 11/13/22   Lanae Boast, MD  senna-docusate (SENOKOT-S) 8.6-50 MG tablet Take 2 tablets by mouth at bedtime.    [provider]  Sodium Phosphates (RA SALINE ENEMA) 19-7 GM/118ML ENEM Place 1 enema rectally daily as needed (for constipation not relieved by bisacodyl suppository- call MD if no relief from enema).    [provider]  SYSTANE COMPLETE 0.6 % SOLN Place 1 drop into both eyes in the morning and at bedtime.    [provider]  Zinc Oxide (Z-BUM) 22 % CREA Apply 1 Application topically See admin instructions. Apply to inner thighs and perineal area twice a day.    [provider]    Physical Exam: BP 121/66   Pulse 87   Temp 99 F (37.2 C) (Oral)   Resp (!) 21   SpO2 93%   General: Elderly female appearing her stated age, eyes closed on my arrival, opens her eyes to voice and tactile stimulus, however not following commands, falls back to sleep quickly.  She is noted to have dried dark material around her mouth. Eyes: EOMI, clear conjuctivae, white sclerea Cardiovascular: RRR, no murmurs or rubs, no peripheral edema  Respiratory: clear to auscultation on bilateral anterior exam, no wheezing, crackles or respiratory distress Abdomen: Soft, nontender, distended.  No bowel tones heard. Skin: dry, no rashes  Musculoskeletal: no joint effusions, normal range of motion           Labs on Admission:  Basic Metabolic Panel: Recent Labs  Lab 11/20/22 0740  NA 136  K 4.4  CL 95*  CO2 31  GLUCOSE 143*  BUN 36*  CREATININE 0.85  CALCIUM 9.5   Liver Function Tests: Recent Labs  Lab 11/20/22 0740  AST 23  ALT 16   ALKPHOS 54  BILITOT 0.5  PROT 6.5  ALBUMIN 3.2*   Recent Labs  Lab 11/20/22 0740  LIPASE 21   Recent Labs  Lab 11/20/22 0740  AMMONIA 25   CBC: Recent Labs  Lab 11/20/22 1027  WBC 9.2  HGB 9.5*  HCT 29.5*  MCV 95.5  PLT 194   Cardiac Enzymes: No results for input(s): "CKTOTAL", "CKMB", "CKMBINDEX", "TROPONINI" in the last 168 hours.  BNP (last 3 results) Recent Labs    10/14/22 0053 10/15/22 0235 10/16/22 0711  BNP 128.8* 125.2* 114.5*    ProBNP (last 3 results) No results for input(s): "PROBNP" in the last 8760 hours.  CBG: No results for input(s): "GLUCAP" in the last 168 hours.  Radiological Exams on Admission: CT ABDOMEN PELVIS W CONTRAST  Result Date: 11/20/2022 CLINICAL DATA:  History of bowel obstruction one-week ago with firm abdominal distention EXAM: CT ABDOMEN AND PELVIS WITH CONTRAST TECHNIQUE: Multidetector CT imaging of the abdomen and pelvis was performed using the standard protocol following bolus administration of intravenous contrast. RADIATION DOSE REDUCTION: This exam was performed according to the departmental dose-optimization program which includes automated exposure control, adjustment of the mA and/or kV according to patient size and/or use of iterative reconstruction technique. CONTRAST:  OMNIPAQUE IOHEXOL 300 MG/ML  SOLN COMPARISON:  CT abdomen and pelvis dated 10/31/2022 FINDINGS: Lower chest: No focal consolidation or pulmonary nodule in the lung bases. New small right and trace left pleural effusions. partially imaged heart size is normal. Hepatobiliary: No focal hepatic lesions. Mild intra and extrahepatic bile duct dilation status post cholecystectomy. Pancreas: Diffusely atrophic pancreas. Spleen: Normal in size. Subcentimeter hypodensity (3:19), too small to characterize. Adrenals/Urinary Tract: No adrenal nodules. No suspicious renal mass, calculi or hydronephrosis. No focal bladder wall thickening. Stomach/Bowel: Partially  imaged esophagus is fluid-filled. The stomach. Diffuse dilation of small and large bowel loops throughout the abdomen with disproportionate dilation of the rectosigmoid colon, which demonstrates mild mural thickening. Colonic diverticulosis without acute diverticulitis. Normal appendix. Vascular/Lymphatic: Aortic atherosclerosis. No enlarged abdominal or pelvic lymph nodes. Reproductive: No adnexal masses. Other: Mild presacral soft tissue stranding. No free fluid, fluid collection, or free air. Musculoskeletal: No acute or abnormal lytic or blastic osseous lesions. Multilevel degenerative changes of the partially imaged thoracic and lumbar spine. Body wall edema. Subcutaneous calcified granulomata in the bilateral gluteal regions. IMPRESSION: 1. Diffuse dilation of small and large bowel loops throughout the abdomen with disproportionate dilation of the rectosigmoid colon, which demonstrates mild mural thickening, suspicious for colonic pseudo-obstruction (Ogilvie syndrome). 2. New small right and trace left pleural effusions. 3.  Aortic Atherosclerosis (ICD10-I70.0). Electronically Signed   By: Agustin Cree M.D.   On: 11/20/2022 09:37    Assessment/Plan Leslie Farley is a 89 y.o. female with medical history significant for advanced dementia, COPD on room air, GERD, hypertension recent hospital stay 1 week ago for partial small bowel obstruction being admitted to the hospital with concerns for abdominal distention and recurrent vomiting.   Abdominal distention-with evidence of vomiting as the patient has dried dark material around her mouth.  Patient is hemodynamically stable not in distress, CT scan as above with evidence of possible partial bowel obstruction, ileus, or colonic pseudoobstruction.  Patient had a similar presentation during her last hospital stay, she was discharged with significant abdominal distention, but was tolerating oral diet and having bowel movements. -Inpatient admission -Keep  n.p.o. -IV fluids -Pain and nausea medication as needed -Continue daily suppository -Appreciate general surgery consultation -Anticipate conservative management, given her recent hospital stay where this was discussed  Concern for coffee-ground emesis-she also had this to her prior admission, hemoglobin remained stable.  She was seen by GI but decision was made for conservative management and no intervention. -Trend hemoglobin -Avoid blood thinners  COPD-no evidence of acute exacerbation, stable on room air -Continue Dulera  DVT prophylaxis: SCDs only    Code Status: Limited: Do not attempt resuscitation (DNR) -DNR-LIMITED -Do Not Intubate/DNI   Consults called: General Surgery  Admission status: The appropriate patient status for this patient is INPATIENT. Inpatient status is judged to be reasonable and necessary in order to provide the required intensity of service to ensure the patient's safety. The patient's presenting symptoms, physical exam findings,  and initial radiographic and laboratory data in the context of their chronic comorbidities is felt to place them at high risk for further clinical deterioration. Furthermore, it is not anticipated that the patient will be medically stable for discharge from the hospital within 2 midnights of admission.    I certify that at the point of admission it is my clinical judgment that the patient will require inpatient hospital care spanning beyond 2 midnights from the point of admission due to high intensity of service, high risk for further deterioration and high frequency of surveillance required  Time spent: 49 minutes  Leslie Stetson Sharlette Dense MD [facility] Pager [phone]  If 7PM-7AM, please contact night-coverage [email or web address] Password [facility]  11/20/2022, 10:44 AM

## 2022-11-20 NOTE — Progress Notes (Signed)
Subjective/Chief Complaint: Patient known to [facility] basically admitted with the same diagnosis after just being discharged on 9/20.  Again having emesis and abdominal distention but still having bowel movements.    Objective: Vital signs in last 24 hours: Temp:  [98.2 F (36.8 C)-99 F (37.2 C)] 98.2 F (36.8 C) (09/27 1141) Pulse Rate:  [87-92] 90 (09/27 1141) Resp:  [16-26] 20 (09/27 1141) BP: (121-176)/(66-88) 152/88 (09/27 1141) SpO2:  [91 %-96 %] 95 % (09/27 1141)    Intake/Output from previous day: No intake/output data recorded. Intake/Output this shift: No intake/output data recorded.  Exam: Elderly, sleeping, difficult to arouse but in no acute distress Abdomen distended but nontender with no peritonitis  Lab Results:  Recent Labs    11/20/22 1027  WBC 9.2  HGB 9.5*  HCT 29.5*  PLT 194   BMET Recent Labs    11/20/22 0740  NA 136  K 4.4  CL 95*  CO2 31  GLUCOSE 143*  BUN 36*  CREATININE 0.85  CALCIUM 9.5   PT/INR No results for input(s): "LABPROT", "INR" in the last 72 hours. ABG No results for input(s): "PHART", "HCO3" in the last 72 hours.  Invalid input(s): "PCO2", "PO2"  Studies/Results: CT ABDOMEN PELVIS W CONTRAST  Result Date: 11/20/2022 CLINICAL DATA:  History of bowel obstruction one-week ago with firm abdominal distention EXAM: CT ABDOMEN AND PELVIS WITH CONTRAST TECHNIQUE: Multidetector CT imaging of the abdomen and pelvis was performed using the standard protocol following bolus administration of intravenous contrast. RADIATION DOSE REDUCTION: This exam was performed according to the departmental dose-optimization program which includes automated exposure control, adjustment of the mA and/or kV according to patient size and/or use of iterative reconstruction technique. CONTRAST:  OMNIPAQUE IOHEXOL 300 MG/ML  SOLN COMPARISON:  CT abdomen and pelvis dated 10/31/2022 FINDINGS: Lower chest: No focal consolidation or pulmonary nodule  in the lung bases. New small right and trace left pleural effusions. partially imaged heart size is normal. Hepatobiliary: No focal hepatic lesions. Mild intra and extrahepatic bile duct dilation status post cholecystectomy. Pancreas: Diffusely atrophic pancreas. Spleen: Normal in size. Subcentimeter hypodensity (3:19), too small to characterize. Adrenals/Urinary Tract: No adrenal nodules. No suspicious renal mass, calculi or hydronephrosis. No focal bladder wall thickening. Stomach/Bowel: Partially imaged esophagus is fluid-filled. The stomach. Diffuse dilation of small and large bowel loops throughout the abdomen with disproportionate dilation of the rectosigmoid colon, which demonstrates mild mural thickening. Colonic diverticulosis without acute diverticulitis. Normal appendix. Vascular/Lymphatic: Aortic atherosclerosis. No enlarged abdominal or pelvic lymph nodes. Reproductive: No adnexal masses. Other: Mild presacral soft tissue stranding. No free fluid, fluid collection, or free air. Musculoskeletal: No acute or abnormal lytic or blastic osseous lesions. Multilevel degenerative changes of the partially imaged thoracic and lumbar spine. Body wall edema. Subcutaneous calcified granulomata in the bilateral gluteal regions. IMPRESSION: 1. Diffuse dilation of small and large bowel loops throughout the abdomen with disproportionate dilation of the rectosigmoid colon, which demonstrates mild mural thickening, suspicious for colonic pseudo-obstruction (Ogilvie syndrome). 2. New small right and trace left pleural effusions. 3.  Aortic Atherosclerosis (ICD10-I70.0). Electronically Signed   By: Agustin Cree M.D.   On: 11/20/2022 09:37    Anti-infectives: Anti-infectives (From admission, onward)    None       Assessment/Plan: Ileus with Ogilvie syndrome  89 year old with a significant history of advanced dementia, CHF, COPD, hypertension, etc. representing with the same chronic ileus after discharge  I again  reviewed her CT scan of the abdomen pelvis.  This is diffusely dilated small and large bowel and there is no obstruction.  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  The nurses reports she is having bowel movements so I do not believe she needs a nasogastric tube.  She may benefit from a rectal tube or a GI consultation for lower endoscopy versus some type of neostigmine. From a surgical standpoint, there is nothing really to offer unless she had a clear obstruction.  Even with a diverting colostomy, she was still have colonic inertia and ileus.  We would not recommend a subtotal colectomy end ileostomy as she could not handle this from a surgical standpoint.  She is a fairly poor operative candidate. As before, we recommend daily suppositories with bowel regimen and mobilizing when possible.  Complex medical decision making   Abigail Miyamoto MD 11/20/2022

## 2022-11-20 NOTE — Plan of Care (Signed)
  Problem: Education: Goal: Knowledge of General Education information will improve Description Including pain rating scale, medication(s)/side effects and non-pharmacologic comfort measures Outcome: Progressing   Problem: Health Behavior/Discharge Planning: Goal: Ability to manage health-related needs will improve Outcome: Progressing   

## 2022-11-20 NOTE — ED Notes (Signed)
[facility] called. 

## 2022-11-20 NOTE — Progress Notes (Signed)
Ikramullah chatted through epic. ED never started NGT wanted to make sure you wanted this placed? also do you want it attached to low intermittent suctioning if placed no order. Patient had BM on unit, patient also passing gas. "I think since the NG tube was not placed yet, and her bowels are working, we can wait until surgery sees her and they can tell you if they still want the NG tube." sx said there is no need for NGT at this time and said he would place a note.

## 2022-11-20 NOTE — ED Triage Notes (Signed)
Pt BIB EMS. From [facility]. Pt had bowel obstruction last week. Presents today with firm abdominal distention. Has dried dark emesis on mouth. Pt doesn't seem in acute distress.   142/78 Hr 90's RR 16 Spo2 98%

## 2022-11-20 NOTE — ED Provider Notes (Signed)
[place] EMERGENCY DEPARTMENT AT [facility] Provider Note   CSN: [id number] Arrival date & time: 11/20/22  0556     History  Chief Complaint  Patient presents with   Dark emesis    Leslie Farley is a 89 y.o. female with past medical history significant for hypertension, CAD, bronchitis, COPD, dementia, advanced age, asthma, who was recently admitted secondary to partial small bowel obstruction presents from [place] [facility] with abdominal distention, dried dark vomit around mouth.  Patient not in any acute distress but does endorse some pain, nausea intermittently.  Discharged after bowel movement after NG tube and suppositories.  HPI     Home Medications Prior to Admission medications   Medication Sig Start Date End Date Taking? Authorizing Provider  acetaminophen (TYLENOL) 500 MG tablet Take 2 tablets (1,000 mg total) by mouth every 6 (six) hours as needed for mild pain or moderate pain. 11/20/16   Elson Areas, PA-C  ADVAIR DISKUS 100-50 MCG/ACT AEPB Inhale 1 puff into the lungs See admin instructions. Inhale 1 puff into the lungs at 8 AM and 8 PM 03/26/22   [provider]  albuterol (VENTOLIN HFA) 108 (90 Base) MCG/ACT inhaler Inhale 2 puffs into the lungs every 6 (six) hours as needed for wheezing or shortness of breath. 04/01/22   Pokhrel, Rebekah Chesterfield, MD  bisacodyl (DULCOLAX) 10 MG suppository Place 1 suppository (10 mg total) rectally daily. 11/14/22   Lanae Boast, MD  Cholecalciferol (VITAMIN D) 50 MCG (2000 UT) tablet Take 2,000 Units by mouth in the morning.    [provider]  cloNIDine (CATAPRES - DOSED IN MG/24 HR) 0.1 mg/24hr patch Place 1 patch (0.1 mg total) onto the skin once a week. 11/16/22   Lanae Boast, MD  cycloSPORINE (RESTASIS) 0.05 % ophthalmic emulsion Place 1 drop into both eyes 2 (two) times daily.    [provider]  ipratropium-albuterol (DUONEB) 0.5-2.5 (3) MG/3ML SOLN Take 3 mLs by nebulization See admin instructions.  Nebulize the contents of one vial (3 ml's) and inhale into the lungs at 8 AM and 8 PM and every 6 hours as needed for shortness of breath or wheezing    [provider]  melatonin 3 MG TABS tablet Take 6 mg by mouth at bedtime.    [provider]  metoCLOPramide (REGLAN) 5 MG tablet Take 1 tablet (5 mg total) by mouth 3 (three) times daily for 7 days. 11/13/22 11/20/22  Lanae Boast, MD  nitroGLYCERIN (NITROSTAT) 0.4 MG SL tablet Place 0.4 mg under the tongue every 5 (five) minutes x 3 doses as needed for chest pain (Do not exceed 3 doses).    [provider]  polyethylene glycol (MIRALAX / GLYCOLAX) 17 g packet Take 17 g by mouth daily. 11/14/22   Lanae Boast, MD  potassium chloride SA (KLOR-CON M) 20 MEQ tablet Take 2 tablets (40 mEq total) by mouth daily. 11/13/22   Lanae Boast, MD  Propylene Glycol (SYSTANE BALANCE) 0.6 % SOLN Place 1 drop into both eyes in the morning and at bedtime. 11/13/22   Lanae Boast, MD  senna-docusate (SENOKOT-S) 8.6-50 MG tablet Take 2 tablets by mouth at bedtime.    [provider]  Sodium Phosphates (RA SALINE ENEMA) 19-7 GM/118ML ENEM Place 1 enema rectally daily as needed (for constipation not relieved by bisacodyl suppository- call MD if no relief from enema).    [provider]  SYSTANE COMPLETE 0.6 % SOLN Place 1 drop into both eyes in the morning  and at bedtime.    [provider]  Zinc Oxide (Z-BUM) 22 % CREA Apply 1 Application topically See admin instructions. Apply to inner thighs and perineal area twice a day.    [provider]      Allergies    Cucumber extract    Review of Systems   Review of Systems  Unable to perform ROS: Dementia    Physical Exam Updated Vital Signs BP 121/66   Pulse 87   Temp 99 F (37.2 C) (Oral)   Resp (!) 21   SpO2 93%  Physical Exam Vitals and nursing note reviewed.  Constitutional:      General: She is not in acute distress.    Appearance: She is  ill-appearing.  HENT:     Head: Normocephalic and atraumatic.     Mouth/Throat:     Comments: Evidence of dark, coffee-ground emesis around mouth Eyes:     General:        Right eye: No discharge.        Left eye: No discharge.  Cardiovascular:     Rate and Rhythm: Normal rate and regular rhythm.     Heart sounds: No murmur heard.    No friction rub. No gallop.  Pulmonary:     Effort: Pulmonary effort is normal.     Breath sounds: Normal breath sounds.  Abdominal:     General: Bowel sounds are normal.     Palpations: Abdomen is soft.     Comments: Distended, firm abdomen with moderate tenderness to palpation diffusely.  No guarding.  Skin:    General: Skin is warm and dry.     Capillary Refill: Capillary refill takes less than 2 seconds.  Neurological:     Mental Status: She is alert. Mental status is at baseline.  Psychiatric:        Mood and Affect: Mood normal.        Behavior: Behavior normal.     ED Results / Procedures / Treatments   Labs (all labs ordered are listed, but only abnormal results are displayed) Labs Reviewed  COMPREHENSIVE METABOLIC PANEL - Abnormal; Notable for the following components:      Result Value   Chloride 95 (*)    Glucose, Bld 143 (*)    BUN 36 (*)    Albumin 3.2 (*)    All other components within normal limits  LIPASE, BLOOD  AMMONIA  URINALYSIS, ROUTINE W REFLEX MICROSCOPIC  CBC    EKG None  Radiology CT ABDOMEN PELVIS W CONTRAST  Result Date: 11/20/2022 CLINICAL DATA:  History of bowel obstruction one-week ago with firm abdominal distention EXAM: CT ABDOMEN AND PELVIS WITH CONTRAST TECHNIQUE: Multidetector CT imaging of the abdomen and pelvis was performed using the standard protocol following bolus administration of intravenous contrast. RADIATION DOSE REDUCTION: This exam was performed according to the departmental dose-optimization program which includes automated exposure control, adjustment of the mA and/or kV according to  patient size and/or use of iterative reconstruction technique. CONTRAST:  OMNIPAQUE IOHEXOL 300 MG/ML  SOLN COMPARISON:  CT abdomen and pelvis dated 10/31/2022 FINDINGS: Lower chest: No focal consolidation or pulmonary nodule in the lung bases. New small right and trace left pleural effusions. partially imaged heart size is normal. Hepatobiliary: No focal hepatic lesions. Mild intra and extrahepatic bile duct dilation status post cholecystectomy. Pancreas: Diffusely atrophic pancreas. Spleen: Normal in size. Subcentimeter hypodensity (3:19), too small to characterize. Adrenals/Urinary Tract: No adrenal nodules. No suspicious renal mass, calculi  or hydronephrosis. No focal bladder wall thickening. Stomach/Bowel: Partially imaged esophagus is fluid-filled. The stomach. Diffuse dilation of small and large bowel loops throughout the abdomen with disproportionate dilation of the rectosigmoid colon, which demonstrates mild mural thickening. Colonic diverticulosis without acute diverticulitis. Normal appendix. Vascular/Lymphatic: Aortic atherosclerosis. No enlarged abdominal or pelvic lymph nodes. Reproductive: No adnexal masses. Other: Mild presacral soft tissue stranding. No free fluid, fluid collection, or free air. Musculoskeletal: No acute or abnormal lytic or blastic osseous lesions. Multilevel degenerative changes of the partially imaged thoracic and lumbar spine. Body wall edema. Subcutaneous calcified granulomata in the bilateral gluteal regions. IMPRESSION: 1. Diffuse dilation of small and large bowel loops throughout the abdomen with disproportionate dilation of the rectosigmoid colon, which demonstrates mild mural thickening, suspicious for colonic pseudo-obstruction (Ogilvie syndrome). 2. New small right and trace left pleural effusions. 3.  Aortic Atherosclerosis (ICD10-I70.0). Electronically Signed   By: Agustin Cree M.D.   On: 11/20/2022 09:37    Procedures Procedures    Medications Ordered in  ED Medications  morphine (PF) 4 MG/ML injection 4 mg (4 mg Intravenous Given 11/20/22 0735)  ondansetron (ZOFRAN) injection 4 mg (4 mg Intravenous Given 11/20/22 0734)  iohexol (OMNIPAQUE) 300 MG/ML solution 100 mL (100 mLs Intravenous Contrast Given 11/20/22 1610)    ED Course/ Medical Decision Making/ A&P Clinical Course as of 11/20/22 1020  Fri Nov 20, 2022  0939 Spoke with Tresa Endo with surgery who recommended bowel rest, question NG tube given vomiting [CP]    Clinical Course User Index [CP] Olene Floss, PA-C                                 Medical Decision Making Amount and/or Complexity of Data Reviewed Labs: ordered. Radiology: ordered.  Risk Prescription drug management. Decision regarding hospitalization.   This patient is a 89 y.o. female who presents to the ED for concern of abdominal pain, vomiting, distension of abdomen, this involves an extensive number of treatment options, and is a complaint that carries with it a high risk of complications and morbidity. The emergent differential diagnosis prior to evaluation includes, but is not limited to,  The causes of generalized abdominal pain include but are not limited to AAA, mesenteric ischemia, appendicitis, diverticulitis, DKA, gastritis, gastroenteritis, AMI, nephrolithiasis, pancreatitis, peritonitis, adrenal insufficiency,lead poisoning, iron toxicity, intestinal ischemia, constipation, UTI,SBO/LBO, splenic rupture, biliary disease, IBD, IBS, PUD, or hepatitis -- high suspicion for obstruction, ileus given recent history . This is not an exhaustive differential.   Past Medical History / Co-morbidities / Social History: hypertension, CAD, bronchitis, COPD, dementia, advanced age, asthma  Additional history: Chart reviewed. Pertinent results include: reviewed labwork, imaging from recent ED visits and hospitalizations  Physical Exam: Physical exam performed. The pertinent findings include: Distended, firm abdomen  with moderate tenderness to palpation diffusely.  No guarding.   Evidence of dark, coffee-ground emesis around mouth  Chronically ill appearing at baseline  Lab Tests: I ordered, and personally interpreted labs.  The pertinent results include:  CBC pending as first specimen hemolyzed. CMP notable for elevated BUN 36, glucose 143, hypochloremia, chloride 95. Normal lipase, normal ammonia   Imaging Studies: I ordered imaging studies including ct abdomen pelvis w contrast. I independently visualized and interpreted imaging which showed ogilvie syndrome, evidence of pseudocolonic obstruction, diffuse air in abdomen throughout . I agree with the radiologist interpretation.   Medications: I ordered medication including morphine, zofran  for pain,  nausea. We will place NG tube for nausea and vomiting in context of ogilvie syndrome  Consultations Obtained: I requested consultation with the general surgery team, spoke with Tresa Endo who recommended bowel rest, NG tube prn,  and discussed lab and imaging findings as well as pertinent plan - they recommend: admit to medicine. Spoke with Dr. Kirby Crigler who agrees to admission at this time.   Disposition: After consideration of the diagnostic results and the patients response to treatment, I feel that patient would benefit from admission .   I discussed this case with my attending physician Dr. Rhunette Croft who cosigned this note including patient's presenting symptoms, physical exam, and planned diagnostics and interventions. Attending physician stated agreement with plan or made changes to plan which were implemented.    Final Clinical Impression(s) / ED Diagnoses Final diagnoses:  None    Rx / DC Orders ED Discharge Orders     None         [place] 11/20/22 1020    Nira Conn, MD 11/20/22 1825

## 2022-11-21 DIAGNOSIS — K5981 Ogilvie syndrome: Secondary | ICD-10-CM | POA: Diagnosis not present

## 2022-11-21 DIAGNOSIS — D62 Acute posthemorrhagic anemia: Secondary | ICD-10-CM | POA: Diagnosis present

## 2022-11-21 LAB — BASIC METABOLIC PANEL
Anion gap: 7 (ref 5–15)
BUN: 39 mg/dL — ABNORMAL HIGH (ref 8–23)
CO2: 29 mmol/L (ref 22–32)
Calcium: 8.7 mg/dL — ABNORMAL LOW (ref 8.9–10.3)
Chloride: 102 mmol/L (ref 98–111)
Creatinine, Ser: 0.82 mg/dL (ref 0.44–1.00)
GFR, Estimated: >60 mL/min (ref >60)
Glucose, Bld: 129 mg/dL — ABNORMAL HIGH (ref 70–99)
Potassium: 4.3 mmol/L (ref 3.5–5.1)
Sodium: 138 mmol/L (ref 135–145)

## 2022-11-21 LAB — CBC
HCT: 26 % — ABNORMAL LOW (ref 36.0–46.0)
Hemoglobin: 8.1 g/dL — ABNORMAL LOW (ref 12.0–15.0)
MCH: 30.9 pg (ref 26.0–34.0)
MCHC: 31.2 g/dL (ref 30.0–36.0)
MCV: 99.2 fL (ref 80.0–100.0)
Platelets: 169 10*3/uL (ref 150–400)
RBC: 2.62 MIL/uL — ABNORMAL LOW (ref 3.87–5.11)
RDW: 15.1 % (ref 11.5–15.5)
WBC: 5.5 10*3/uL (ref 4.0–10.5)
nRBC: 0 % (ref 0.0–0.2)

## 2022-11-21 LAB — MRSA NEXT GEN BY PCR, NASAL: MRSA by PCR Next Gen: NOT DETECTED

## 2022-11-21 MED ORDER — ORAL CARE MOUTH RINSE: 15.0000 mL | OROMUCOSAL | Status: DC | PRN

## 2022-11-21 MED ORDER — POLYVINYL ALCOHOL 1.4 % OP SOLN
1.0000 [drp] | Freq: Two times a day (BID) | OPHTHALMIC | Status: DC
  Administered 2022-11-21 – 2022-11-23 (×4): 1 [drp] via OPHTHALMIC
  Filled 2022-11-21: qty 15

## 2022-11-21 MED ORDER — ORAL CARE MOUTH RINSE
15.0000 mL | OROMUCOSAL | Status: DC
  Administered 2022-11-21 – 2022-11-23 (×7): 15 mL via OROMUCOSAL

## 2022-11-21 MED ORDER — CYCLOSPORINE 0.05 % OP EMUL
1.0000 [drp] | Freq: Two times a day (BID) | OPHTHALMIC | Status: DC
  Administered 2022-11-21 – 2022-11-23 (×4): 1 [drp] via OPHTHALMIC
  Filled 2022-11-21 (×4): qty 30

## 2022-11-21 MED ORDER — MELATONIN 3 MG PO TABS
6.0000 mg | ORAL_TABLET | Freq: Every day | ORAL | Status: DC
  Administered 2022-11-22: 6 mg via ORAL
  Filled 2022-11-21: qty 2

## 2022-11-21 MED ORDER — PANTOPRAZOLE SODIUM 40 MG IV SOLR
40.0000 mg | INTRAVENOUS | Status: DC
  Administered 2022-11-21 – 2022-11-22 (×2): 40 mg via INTRAVENOUS
  Filled 2022-11-21 (×2): qty 10

## 2022-11-21 MED ORDER — IPRATROPIUM-ALBUTEROL 0.5-2.5 (3) MG/3ML IN SOLN
3.0000 mL | Freq: Two times a day (BID) | RESPIRATORY_TRACT | Status: DC
  Administered 2022-11-21 – 2022-11-23 (×4): 3 mL via RESPIRATORY_TRACT
  Filled 2022-11-21 (×4): qty 3

## 2022-11-21 MED ORDER — POLYETHYLENE GLYCOL 3350 17 G PO PACK
17.0000 g | PACK | Freq: Every day | ORAL | Status: DC
  Administered 2022-11-22 – 2022-11-23 (×2): 17 g via ORAL
  Filled 2022-11-21 (×2): qty 1

## 2022-11-21 NOTE — Progress Notes (Signed)
Subjective/Chief Complaint: Awake but not communicative   Objective: Vital signs in last 24 hours: Temp:  [98.2 F (36.8 C)-99 F (37.2 C)] 98.5 F (36.9 C) (09/28 0501) Pulse Rate:  [82-93] 82 (09/28 0501) Resp:  [14-23] 18 (09/28 0501) BP: (123-152)/(52-88) 135/62 (09/28 0501) SpO2:  [91 %-95 %] 92 % (09/28 0501) Last BM Date : 11/20/22  Intake/Output from previous day: 09/27 0701 - 09/28 0700 In: 1436.5 [I.V.:1436.5] Out: -  Intake/Output this shift: No intake/output data recorded.  General appearance: alert and no distress Resp: clear to auscultation bilaterally Cardio: regular rate and rhythm GI: soft, nontender  Lab Results:  Recent Labs    11/20/22 1027 11/21/22 0430  WBC 9.2 5.5  HGB 9.5* 8.1*  HCT 29.5* 26.0*  PLT 194 169   BMET Recent Labs    11/20/22 0740 11/21/22 0430  NA 136 138  K 4.4 4.3  CL 95* 102  CO2 31 29  GLUCOSE 143* 129*  BUN 36* 39*  CREATININE 0.85 0.82  CALCIUM 9.5 8.7*   PT/INR No results for input(s): "LABPROT", "INR" in the last 72 hours. ABG No results for input(s): "PHART", "HCO3" in the last 72 hours.  Invalid input(s): "PCO2", "PO2"  Studies/Results: CT ABDOMEN PELVIS W CONTRAST  Result Date: 11/20/2022 CLINICAL DATA:  History of bowel obstruction one-week ago with firm abdominal distention EXAM: CT ABDOMEN AND PELVIS WITH CONTRAST TECHNIQUE: Multidetector CT imaging of the abdomen and pelvis was performed using the standard protocol following bolus administration of intravenous contrast. RADIATION DOSE REDUCTION: This exam was performed according to the departmental dose-optimization program which includes automated exposure control, adjustment of the mA and/or kV according to patient size and/or use of iterative reconstruction technique. CONTRAST:  OMNIPAQUE IOHEXOL 300 MG/ML  SOLN COMPARISON:  CT abdomen and pelvis dated 10/31/2022 FINDINGS: Lower chest: No focal consolidation or pulmonary nodule in the lung  bases. New small right and trace left pleural effusions. partially imaged heart size is normal. Hepatobiliary: No focal hepatic lesions. Mild intra and extrahepatic bile duct dilation status post cholecystectomy. Pancreas: Diffusely atrophic pancreas. Spleen: Normal in size. Subcentimeter hypodensity (3:19), too small to characterize. Adrenals/Urinary Tract: No adrenal nodules. No suspicious renal mass, calculi or hydronephrosis. No focal bladder wall thickening. Stomach/Bowel: Partially imaged esophagus is fluid-filled. The stomach. Diffuse dilation of small and large bowel loops throughout the abdomen with disproportionate dilation of the rectosigmoid colon, which demonstrates mild mural thickening. Colonic diverticulosis without acute diverticulitis. Normal appendix. Vascular/Lymphatic: Aortic atherosclerosis. No enlarged abdominal or pelvic lymph nodes. Reproductive: No adnexal masses. Other: Mild presacral soft tissue stranding. No free fluid, fluid collection, or free air. Musculoskeletal: No acute or abnormal lytic or blastic osseous lesions. Multilevel degenerative changes of the partially imaged thoracic and lumbar spine. Body wall edema. Subcutaneous calcified granulomata in the bilateral gluteal regions. IMPRESSION: 1. Diffuse dilation of small and large bowel loops throughout the abdomen with disproportionate dilation of the rectosigmoid colon, which demonstrates mild mural thickening, suspicious for colonic pseudo-obstruction (Ogilvie syndrome). 2. New small right and trace left pleural effusions. 3.  Aortic Atherosclerosis (ICD10-I70.0). Electronically Signed   By: Agustin Cree M.D.   On: 11/20/2022 09:37    Anti-infectives: Anti-infectives (From admission, onward)    None       Assessment/Plan: s/p * No surgery found * Ileus of large and small bowel(Ogilvie 's) Poor surgical candidate and not obstructed Follow GI recs for bowel regimen Will sign off  LOS: 1 day    Renae Fickle  Carolynne Edouard  III 11/21/2022

## 2022-11-21 NOTE — Progress Notes (Signed)
[facility] [phone] [facility] Hospitalized Hospice Patient Visit  Ms. Yudith Norlander is a current hospice patient, start of care 8.29.24 with hospice diagnosos of COPD, followed at [facility] who was admitted to [facility] on 9.27.24 with primary diagnosis of small bowel obstruction. [facility] was notified today by inpatient provider. Per Dr. Patric Dykes, hospice physician, this is a related hospital admission.   Visited with patient in hospital. She is resting quietly, without distress. Denies pain. Contacted daughter by phone and discussed current status. Started discussion regarding GOC as this is a repeated admission for similar symptoms in a short period of time. Her daughter would like for her to be monitored and receive medications/ transfusions if needed but no endoscopic or surgical interventions. Will likely benefit from further GOC discussion.    Patient remains GIP appropriate due to need for IV protonix, IV fluids, and close monitoring of hemoglobin.  Vital Signs: 99.3/77/20     138/59    O2 95% on RA  I&O:  1436/ not documented  Abnormal labs: BUN 39, Ca 8.7, HGB 9.5, 8.1  Diagnostics:  CT ABDOMEN AND PELVIS WITH CONTRAST    IMPRESSION: 1. Diffuse dilation of small and large bowel loops throughout the abdomen with disproportionate dilation of the rectosigmoid colon, which demonstrates mild mural thickening, suspicious for colonic pseudo-obstruction (Ogilvie syndrome). 2. New small right and trace left pleural effusions. 3.  Aortic Atherosclerosis (ICD10-I70.0).   IV/PRN Meds: protonix 40 meq IV x1, NS IVF at 133ml/hr, Morphine 4mg  IV x 1, Zofran 4mg  IV x 1 Problem List:  Abdominal pain, ABLA Patient with h/o recurrent ileus, SBO and recurrent hospitalizations presenting with abdominal pain Symptoms appear to be improved today and she is not a surgical candidate regardless Surgery has consulted and signed off Needs good bowel regimen However, she  does have a drop in Hgb which is concerning for possible UGI bleeding (prior report of concern for coffee ground emesis on prior admission as well as with this admission) Given that she is likely not a surgical candidate, will not re-consult GI at this time Will follow Hgb and transfuse as needed for Hgb <7 She was previously transitioned to hospice, although her daughter appears to still want to treat the treatable Will start Protonix IV daily Hospice notified of admission and asked to consult    Dementia/GOC Advanced dementia, based on discussion with family She is enrolled in hospice She is DNR/DNR but family is currently still inclined to treat the treatable Will ensure that hospice is involved during and after hospitalization  Discharge Planning: Ongoing  Family Contact: Communicated with daughter by phone.  IDT: Updated  Goals of Care: DNR  Should patient need ambulance transfer at discharge- please use [facility]) as they contract this service for our active hospice patients.  Glenna Fellows BSN, RN, [facility] hospital liaison [phone]

## 2022-11-21 NOTE — Plan of Care (Signed)

## 2022-11-21 NOTE — Hospital Course (Signed)
89yo with h/o advanced dementia, COPD, and HTN who presented on 9/27 with abdominal pain.  This is her 4th admission in the last 2 months for ileus/SBO.  Surgery was consulted and has signed off; she has ileus/Ogilvie's and is not a surgical candidate.  She is DNR and is enrolled in hospice.

## 2022-11-21 NOTE — Progress Notes (Signed)
Progress Note   Patient: Leslie Farley ION:[id number] DOB: Feb 04, 1931 DOA: 11/20/2022     1 DOS: the patient was seen and examined on 11/21/2022   Brief hospital course: 89yo with h/o advanced dementia, COPD, and HTN who presented on 9/27 with abdominal pain.  This is her 4th admission in the last 2 months for ileus/SBO.  Surgery was consulted and has signed off; she has ileus/Ogilvie's and is not a surgical candidate.  She is DNR and is enrolled in hospice.  Assessment and Plan:  Abdominal pain, ABLA Patient with h/o recurrent ileus, SBO and recurrent hospitalizations presenting with abdominal pain Symptoms appear to be improved today and she is not a surgical candidate regardless Surgery has consulted and signed off Needs good bowel regimen However, she does have a drop in Hgb which is concerning for possible UGI bleeding (prior report of concern for coffee ground emesis on prior admission as well as with this admission) Given that she is likely not a surgical candidate, will not re-consult GI at this time Will follow Hgb and transfuse as needed for Hgb <7 She was previously transitioned to hospice, although her daughter appears to still want to treat the treatable Will start Protonix IV daily Hospice notified of admission and asked to consult  COPD Continue Advair (Dulera per formulary), Duonebs, Albuterol  HTN Continue Catapres  Dementia/GOC Advanced dementia, based on discussion with family She is enrolled in hospice She is DNR/DNR but family is currently still inclined to treat the treatable Will ensure that hospice is involved during and after hospitalization     Consultants: Surgery [facility] team  Procedures: None  Antibiotics: None  30 Day Unplanned Readmission Risk Score    Flowsheet Row ED to Hosp-Admission (Current) from 11/20/2022 in [facility]  30 Day Unplanned Readmission Risk Score (%) 33.44 Filed at  11/21/2022 0800       This score is the patient's risk of an unplanned readmission within 30 days of being discharged (0 -100%). The score is based on dignosis, age, lab data, medications, orders, and past utilization.   Low:  0-14.9   Medium: 15-21.9   High: 22-29.9   Extreme: 30 and above           Subjective: She was sleeping comfortably during the evaluation.  I spoke with her daughter.  Her dementia has progressed in 6-8 months. She needs help with feeding and eating/drinking.  She is not able to walk on her own.  She is at a SNF and has had a prolonged distended belly.  It got hard and she was vomiting.  Previously her bowels were moving at the hospital.  Her daughter reports she is DNR, no feeding tube, do not want pain.  She is not a surgical candidate and family would not want that either.  Colonoscopy is too aggressive.  They would agree with transfusion.  She has episodes of hypersomnolence and does not recognize family at times but other times is engaged and interactive.  She is still enrolled in hospice.   Objective: Vitals:   11/21/22 0501 11/21/22 1202  BP: 135/62 (!) 138/59  Pulse: 82 77  Resp: 18 20  Temp: 98.5 F (36.9 C) 99.3 F (37.4 C)  SpO2: 92% 95%    Intake/Output Summary (Last 24 hours) at 11/21/2022 1318 Last data filed at 11/21/2022 0358 Gross per 24 hour  Intake 1436.5 ml  Output --  Net 1436.5 ml   There were no vitals  filed for this visit.  Exam:  General:  Appears calm and comfortable and is in NAD, sleeping throughout evaluation Eyes:  normal lids, closed throughout ENT:  grossly normal hearing, lips & tongue, mmm Neck:  no LAD, masses or thyromegaly Cardiovascular:  RRR, no m/r/g. No LE edema.  Respiratory:   CTA bilaterally with no wheezes/rales/rhonchi.  Normal respiratory effort. Abdomen:  soft, NT, mildly distended Skin:  no rash or induration seen on limited exam Musculoskeletal:  no bony abnormality Psychiatric:  slept throughout,  no attempt at interaction Neurologic:  unable to effectively perform  Data Reviewed: I have reviewed the patient's lab results since admission.  Pertinent labs for today include:   Glucose 129 BUN 39 WBC 5.5 Hgb 8.1, down from 9.5     Family Communication: None present; I spoke with her daughter by telephone  Disposition: Status is: Inpatient Remains inpatient appropriate because: ongoing evaluation/treatment     Time spent: 35 minutes  Unresulted Labs (From admission, onward)     Start     Ordered   11/22/22 0500  CBC with Differential/Platelet  Tomorrow morning,   R        11/21/22 1318   11/22/22 0500  Basic metabolic panel  Tomorrow morning,   R        11/21/22 1318             Author: Jonah Blue, MD 11/21/2022 1:18 PM  For on call review [email or web address].

## 2022-11-22 DIAGNOSIS — K5981 Ogilvie syndrome: Secondary | ICD-10-CM | POA: Diagnosis not present

## 2022-11-22 LAB — CBC WITH DIFFERENTIAL/PLATELET
Abs Immature Granulocytes: 0.01 10*3/uL (ref 0.00–0.07)
Basophils Absolute: 0 10*3/uL (ref 0.0–0.1)
Basophils Relative: 0 %
Eosinophils Absolute: 0.1 10*3/uL (ref 0.0–0.5)
Eosinophils Relative: 1 %
HCT: 27.2 % — ABNORMAL LOW (ref 36.0–46.0)
Hemoglobin: 8.2 g/dL — ABNORMAL LOW (ref 12.0–15.0)
Immature Granulocytes: 0 %
Lymphocytes Relative: 35 %
Lymphs Abs: 1.9 10*3/uL (ref 0.7–4.0)
MCH: 30.8 pg (ref 26.0–34.0)
MCHC: 30.1 g/dL (ref 30.0–36.0)
MCV: 102.3 fL — ABNORMAL HIGH (ref 80.0–100.0)
Monocytes Absolute: 0.9 10*3/uL (ref 0.1–1.0)
Monocytes Relative: 18 %
Neutro Abs: 2.4 10*3/uL (ref 1.7–7.7)
Neutrophils Relative %: 46 %
Platelets: 166 10*3/uL (ref 150–400)
RBC: 2.66 MIL/uL — ABNORMAL LOW (ref 3.87–5.11)
RDW: 15.2 % (ref 11.5–15.5)
WBC: 5.3 10*3/uL (ref 4.0–10.5)
nRBC: 0 % (ref 0.0–0.2)

## 2022-11-22 LAB — BASIC METABOLIC PANEL
Anion gap: 9 (ref 5–15)
BUN: 28 mg/dL — ABNORMAL HIGH (ref 8–23)
CO2: 24 mmol/L (ref 22–32)
Calcium: 8.6 mg/dL — ABNORMAL LOW (ref 8.9–10.3)
Chloride: 108 mmol/L (ref 98–111)
Creatinine, Ser: 0.71 mg/dL (ref 0.44–1.00)
GFR, Estimated: >60 mL/min (ref >60)
Glucose, Bld: 94 mg/dL (ref 70–99)
Potassium: 4 mmol/L (ref 3.5–5.1)
Sodium: 141 mmol/L (ref 135–145)

## 2022-11-22 MED ORDER — ENSURE ENLIVE PO LIQD
237.0000 mL | Freq: Two times a day (BID) | ORAL | Status: DC
  Administered 2022-11-22 – 2022-11-23 (×2): 237 mL via ORAL

## 2022-11-22 NOTE — Plan of Care (Signed)
Patient more alert today. She was able to talk to her daughter today. Remains on RA. No signs of pain. Skin care rendered. Safety precautions maintained.   Noted with difficulty swallowing when eating but has about 25% of meal-MD notified.  Problem: Education: Goal: Knowledge of General Education information will improve Description: Including pain rating scale, medication(s)/side effects and non-pharmacologic comfort measures O utcome: Progressing   Problem: Health Behavior/Discharge Planning: Goal: Ability to manage health-related needs will improve Outcome: Progressing   Problem: Clinical Measurements: Goal: Ability to maintain clinical measurements within normal limits will improve Outcome: Progressing Goal: Will remain free from infection Outcome: Progressing Goal: Diagnostic test results will improve Outcome: Progressing Goal: Respiratory complications will improve Outcome: Progressing Goal: Cardiovascular complication will be avoided Outcome: Progressing   Problem: Activity: Goal: Risk for activity intolerance will decrease Outcome: Progressing   Problem: Nutrition: Goal: Adequate nutrition will be maintained Outcome: Progressing   Problem: Coping: Goal: Level of anxiety will decrease Outcome: Progressing   Problem: Elimination: Goal: Will not experience complications related to bowel motility Outcome: Progressing Goal: Will not experience complications related to urinary retention Outcome: Progressing   Problem: Pain Managment: Goal: General experience of comfort will improve Outcome: Progressing   Problem: Safety: Goal: Ability to remain free from injury will improve Outcome: Progressing   Problem: Skin Integrity: Goal: Risk for impaired skin integrity will decrease Outcome: Progressing

## 2022-11-22 NOTE — Progress Notes (Addendum)
Progress Note   Patient: Leslie Farley [id number] DOB: [birth year] DOA: 11/20/2022     2 DOS: the patient was seen and examined on 11/22/2022   Brief hospital course: 89yo with h/o advanced dementia, COPD, and HTN who presented on 9/27 with abdominal pain.  This is her 4th admission in the last 2 months for ileus/SBO.  Surgery was consulted and has signed off; she has ileus/Ogilvie's and is not a surgical candidate.  She is DNR and is enrolled in hospice.  Assessment and Plan:  Abdominal pain, ABLA Patient with h/o recurrent ileus, SBO and recurrent hospitalizations presenting with abdominal pain Symptoms appear to be improved and she is not a surgical candidate regardless Surgery has consulted and signed off Needs good bowel regimen However, she does have a drop in Hgb which is concerning for possible UGI bleeding (prior report of concern for coffee ground emesis on prior admission as well as with this admission) Given that she is likely not a surgical candidate, will not re-consult GI at this time Will follow Hgb and transfuse as needed for Hgb <7 Hgb is currently stable, however She was previously transitioned to hospice Will continue Protonix IV daily for now Hospice notified of admission and asked to consult Will attempt feeding today when her daughter is here; if she is not alert enough to safely eat (which seems unlikely), family is open to transitioning to comfort care and residential hospice   COPD Continue Advair (Dulera per formulary), Duonebs, Albuterol   HTN Continue Catapres   Dementia/GOC Advanced dementia, based on discussion with family She is enrolled in hospice She is DNR/DNR but family still wanted to try to treat the treatable on admission Will ensure that hospice is involved during and after hospitalization Given lack of improvement, we may be nearing time to transition to full comfort with residential hospice          Consultants: Surgery [facility]osp[facility] team   Procedures: None   Antibiotics: None    30 Day Unplanned Readmission Risk Score    Flowsheet Row ED to Hosp-Admission (Current) from 11/20/2022 in [facility]  30 Day Unplanned Readmission Risk Score (%) 35.57 Filed at 11/22/2022 0401       This score is the patient's risk of an unplanned readmission within 30 days of being discharged (0 -100%). The score is based on dignosis, age, lab data, medications, orders, and past utilization.   Low:  0-14.9   Medium: 15-21.9   High: 22-29.9   Extreme: 30 and above           Subjective: Patient was somnolent throughout without attempt at interaction.  Her nurse reports that she is having persistent stools, diarrhea.   Her sister came yesterday and will come again today.  She opened her eyes yesterday and slept mostly.  Family would like to try feeding when daughter is there.  She is on a soft diet at her facility.  Based on discussion with her daughter, if she is not able to take PO intake they are open to transitioning to residential hospice.   Objective: Vitals:   11/21/22 1949 11/22/22 0536  BP:  (!) 185/81  Pulse:  77  Resp:  19  Temp:  98.2 F (36.8 C)  SpO2: 99% 96%    Intake/Output Summary (Last 24 hours) at 11/22/2022 0749 Last data filed at 11/21/2022 1841 Gross per 24 hour  Intake 1453.8 ml  Output --  Net 1453.8 ml  There were no vitals filed for this visit.  Exam:   General:  Appears calm and comfortable and is in NAD, sleeping throughout evaluation Eyes:  normal lids, closed throughout ENT:  grossly normal hearing, lips & tongue, mmm Neck:  no LAD, masses or thyromegaly Cardiovascular:  RRR, no m/r/g. No LE edema.  Respiratory:   CTA bilaterally with no wheezes/rales/rhonchi.  Normal respiratory effort. Abdomen:  soft, apparently NT, distended Skin:  no rash or induration seen on limited exam Musculoskeletal:  no bony  abnormality Psychiatric:  slept throughout, no attempt at interaction Neurologic:  unable to effectively perform  Data Reviewed: I have reviewed the patient's lab results since admission.  Pertinent labs for today include:   Unremarkable BMP Hgb 8.2 - stable     Family Communication: None present; I spoke with her daughter by telephone  Disposition: Status is: Inpatient Remains inpatient appropriate because: addressing GOC     Time spent: 35 minutes   Author: Jonah Blue, MD 11/22/2022 7:49 AM  For on call review [email or web address].

## 2022-11-22 NOTE — Progress Notes (Signed)
Leslie Farley [phone] [facility] Patient Visit   [name] is a current hospice patient, start of care 8.29.24 with hospice diagnosis of COPD, followed at [facility] who was admitted to [facility] on 9.27.24 with primary diagnosis of small bowel obstruction. [facility] was notified today by inpatient provider. Per Dr. Patric Farley, hospice physician, this is a related hospital admission.    Visited with patient at bedside, daughter Leslie Farley present in room and daughter Leslie Farley on the phone.  Patient is more alert this afternoon and per RN this is a large change for patient.  We discussed patient's recurring symptoms, drop in hemoglobin and goals of care.  Per daughters, they understand patient would not be good surgical candidate and they would not want to put her through unnecessary procedures.  They are open to transfusing her with blood given her drop in hemoglobin, however they would not want to keep transfusing if there is a larger issue present as they are aware this is a temporary fix.  Leslie Farley is staying to feed patient for lunch to see if she eats better today and can stay awake for meals.  We did discuss possibility of transitioning to residential hospice, which they are open to if the patient continues to decline.  They are open to seeing how the next 24 hours go to see if it is appropriate for residential hospice or to see if patient improves.   Patient remains GIP appropriate due to need for IV protonix, IV fluids, and close monitoring of hemoglobin.   Vital Signs: 98.2/77/19     185/81    O2 96% on RA   I&O:  1857/ not documented   Abnormal labs: RBC 2.66, Hgb 8.2, HCT 27.2, BUN 28, Calcium 8.6   Diagnostics:  None new   IV/PRN Meds: protonix 40 meq IV x1, NS IVF at 165ml/hr, Morphine 4mg  IV x 1  Problem List (per Hospitalist):  Assessment and Plan:   Abdominal pain, ABLA Patient with h/o recurrent ileus, SBO and recurrent  hospitalizations presenting with abdominal pain Symptoms appear to be improved and she is not a surgical candidate regardless Surgery has consulted and signed off Needs good bowel regimen However, she does have a drop in Hgb which is concerning for possible UGI bleeding (prior report of concern for coffee ground emesis on prior admission as well as with this admission) Given that she is likely not a surgical candidate, will not re-consult GI at this time Will follow Hgb and transfuse as needed for Hgb <7 Hgb is currently stable, however She was previously transitioned to hospice Will continue Protonix IV daily for now Hospice notified of admission and asked to consult Will attempt feeding today when her daughter is here; if she is not alert enough to safely eat (which seems unlikely), family is open to transitioning to comfort care and residential hospice   COPD Continue Advair (Dulera per formulary), Duonebs, Albuterol   HTN Continue Catapres   Dementia/GOC Advanced dementia, based on discussion with family She is enrolled in hospice She is DNR/DNR but family still wanted to try to treat the treatable on admission Will ensure that hospice is involved during and after hospitalization Given lack of improvement, we may be nearing time to transition to full comfort with residential hospice    Discharge Planning: Ongoing   Family Contact: Spoke to daughters during in person conversation.   IDT: Updated   Goals of Care: DNR   Should patient need ambulance  transfer at discharge- please use [facility]) as they contract this service for our active hospice patients.   Doreatha Martin, RN, [facility] [phone]

## 2022-11-22 NOTE — Plan of Care (Signed)

## 2022-11-23 DIAGNOSIS — K5981 Ogilvie syndrome: Secondary | ICD-10-CM | POA: Diagnosis not present

## 2022-11-23 MED ORDER — ENSURE ENLIVE PO LIQD: 237.0000 mL | Freq: Two times a day (BID) | ORAL | Status: AC

## 2022-11-23 MED ORDER — PANTOPRAZOLE SODIUM 40 MG PO TBEC: 40.0000 mg | DELAYED_RELEASE_TABLET | Freq: Every day | ORAL | Status: AC

## 2022-11-23 NOTE — TOC Transition Note (Signed)
Transition of Care [facility]) - CM/SW Discharge Note   Patient Details  Name: Leslie Farley MRN: [id number] Date of Birth: 1930-06-12  Transition of Care [facility]) CM/SW Contact:  Harriett Sine, RN Phone [phone]  11/23/2022, 1:14 PM   Clinical Narrative:    Dorann Lodge called to confirm be available, spoke with [facility]. Shawn with [facility] updated. [facility] scheduled, nurse call report to [phone]. Family Nathalie updated.   Final next level of care: Skilled Nursing Facility Barriers to Discharge: No Barriers Identified   Patient Goals and CMS Choice CMS Medicare.gov Compare Post Acute Care list provided to:: Other (Comment Required) (returning to [facility]) Choice offered to / list presented to : NA  Discharge Placement                         Discharge Plan and Services Additional resources added to the After Visit Summary for       Post Acute Care Choice: NA                               Social Determinants of Health (SDOH) Interventions SDOH Screenings   Food Insecurity: Patient Unable To Answer (11/20/2022)  Housing: Patient Unable To Answer (11/20/2022)  Transportation Needs: Patient Unable To Answer (11/20/2022)  Utilities: Patient Unable To Answer (11/20/2022)  Tobacco Use: Medium Risk (11/20/2022)     Readmission Risk Interventions    11/02/2022    9:28 AM 03/31/2022    1:08 PM  Readmission Risk Prevention Plan  Post Dischage Appt  Complete  Medication Screening  Complete  Transportation Screening Complete Complete  Medication Review (RN Care Manager) Complete   HRI or Home Care Consult Complete   SW Recovery Care/Counseling Consult Complete   Palliative Care Screening Complete   Skilled Nursing Facility Complete

## 2022-11-23 NOTE — Plan of Care (Signed)
  Problem: Education: Goal: Knowledge of General Education information will improve Description: Including pain rating scale, medication(s)/side effects and non-pharmacologic comfort measures Outcome: Not Progressing   Problem: Health Behavior/Discharge Planning: Goal: Ability to manage health-related needs will improve Outcome: Not Progressing   Problem: Clinical Measurements: Goal: Ability to maintain clinical measurements within normal limits will improve Outcome: Not Progressing Goal: Will remain free from infection Outcome: Not Progressing Goal: Diagnostic test results will improve Outcome: Not Progressing Goal: Respiratory complications will improve Outcome: Not Progressing Goal: Cardiovascular complication will be avoided Outcome: Not Progressing   Problem: Activity: Goal: Risk for activity intolerance will decrease Outcome: Not Progressing   Problem: Coping: Goal: Level of anxiety will decrease Outcome: Not Progressing

## 2022-11-23 NOTE — Progress Notes (Signed)
RN called report to receiving nurse, Tamera Punt, at [employer]. Nurse expressed no further questions to the RN at this time.

## 2022-11-23 NOTE — Discharge Summary (Signed)
Physician Discharge Summary   Patient: Leslie Farley MRN: [id number] DOB: 1930-08-28  Admit date:     11/20/2022  Discharge date: 11/23/22  Discharge Physician: Jonah Blue   PCP: Pcp, No   Recommendations at discharge:   You are being discharged back to your nursing facility under hospice care Add Protonix 40 mg once daily  Discharge Diagnoses: Principal Problem:   Ogilvie's syndrome Active Problems:   Ileus (HCC)   COPD (chronic obstructive pulmonary disease) (HCC)   Essential hypertension   Dementia (HCC)   Goals of care, counseling/discussion   ABLA (acute blood loss anemia)   Hospital Course: 89yo with h/o advanced dementia, COPD, and HTN who presented on 9/27 with abdominal pain.  This is her 4th admission in the last 2 months for ileus/SBO.  Surgery was consulted and has signed off; she has ileus/Ogilvie's and is not a surgical candidate.  She is DNR and is enrolled in hospice.  Assessment and Plan:  Abdominal pain, ABLA Patient with h/o recurrent ileus, SBO and recurrent hospitalizations presenting with abdominal pain Symptoms appear to be improved and she is not a surgical candidate regardless Surgery has consulted and signed off Needs good bowel regimen However, she does have a drop in Hgb which is concerning for possible UGI bleeding (prior report of concern for coffee ground emesis on prior admission as well as with this admission) Given that she is likely not a surgical candidate, will not re-consult GI at this time Hgb is currently stable She was previously transitioned to hospice and they have been consulting in the hospital Transition Protonix to PO Family is open to transitioning to comfort care and residential hospice when needed For now she is medically stable and can dc back to SNF under hospice care   COPD Continue Advair (Dulera per formulary), Duonebs, Albuterol   HTN Continue Catapres   Dementia/GOC Advanced dementia, based on  discussion with family She is enrolled in hospice She is DNR/DNR but family still wanted to try to treat the treatable on admission She has stabilized for now and can return to her facility under hospice care         Consultants: Surgery [facility] team   Procedures: None   Antibiotics: None   30 Day Unplanned Readmission Risk Score    Flowsheet Row ED to Hosp-Admission (Current) from 11/20/2022 in [facility]  30 Day Unplanned Readmission Risk Score (%) 36.04 Filed at 11/23/2022 0801       This score is the patient's risk of an unplanned readmission within 30 days of being discharged (0 -100%). The score is based on dignosis, age, lab data, medications, orders, and past utilization.   Low:  0-14.9   Medium: 15-21.9   High: 22-29.9   Extreme: 30 and above          Pain control - [place] Controlled Substance Reporting System database was reviewed. and patient was instructed, not to drive, operate heavy machinery, perform activities at heights, swimming or participation in water activities or provide baby-sitting services while on Pain, Sleep and Anxiety Medications; until their outpatient Physician has advised to do so again. Also recommended to not to take more than prescribed Pain, Sleep and Anxiety Medications.   Disposition: Skilled nursing facility Diet recommendation:  Regular diet DISCHARGE MEDICATION: Allergies as of 11/23/2022       Reactions   Cucumber Extract Other (See Comments)   "Allergic," per [facility]  Medication List     STOP taking these medications    metoCLOPramide 5 MG tablet Commonly known as: Reglan   potassium chloride SA 20 MEQ tablet Commonly known as: KLOR-CON M       TAKE these medications    acetaminophen 500 MG tablet Commonly known as: TYLENOL Take 2 tablets (1,000 mg total) by mouth every 6 (six) hours as needed for mild pain or moderate pain.   Advair Diskus 100-50  MCG/ACT Aepb Generic drug: fluticasone-salmeterol Inhale 1 puff into the lungs See admin instructions. Inhale 1 puff into the lungs at 8 AM and 8 PM   amLODipine 5 MG tablet Commonly known as: NORVASC Take 5 mg by mouth daily.   bisacodyl 10 MG suppository Commonly known as: DULCOLAX Place 1 suppository (10 mg total) rectally daily.   cloNIDine 0.1 mg/24hr patch Commonly known as: CATAPRES - Dosed in mg/24 hr Place 1 patch (0.1 mg total) onto the skin once a week.   cycloSPORINE 0.05 % ophthalmic emulsion Commonly known as: RESTASIS Place 1 drop into both eyes 2 (two) times daily.   feeding supplement Liqd Take 237 mLs by mouth 2 (two) times daily between meals.   ipratropium-albuterol 0.5-2.5 (3) MG/3ML Soln Commonly known as: DUONEB Take 3 mLs by nebulization See admin instructions. Nebulize the contents of one vial (3 ml's) and inhale into the lungs at 8 AM and 8 PM and every 6 hours as needed for shortness of breath or wheezing   melatonin 3 MG Tabs tablet Take 6 mg by mouth at bedtime.   nitroGLYCERIN 0.4 MG SL tablet Commonly known as: NITROSTAT Place 0.4 mg under the tongue every 5 (five) minutes x 3 doses as needed for chest pain (Do not exceed 3 doses).   pantoprazole 40 MG tablet Commonly known as: Protonix Take 1 tablet (40 mg total) by mouth daily.   polyethylene glycol 17 g packet Commonly known as: MIRALAX / GLYCOLAX Take 17 g by mouth daily.   senna-docusate 8.6-50 MG tablet Commonly known as: Senokot-S Take 2 tablets by mouth at bedtime.   Systane Balance 0.6 % Soln Generic drug: Propylene Glycol Place 1 drop into both eyes in the morning and at bedtime.   Vitamin D 50 MCG (2000 UT) tablet Take 2,000 Units by mouth in the morning.        Discharge Exam: There were no vitals filed for this visit.   Subjective: She is awake, smiling, and pleasant.  Her daughter says she ate yesterday and this AM.  She reported feeling hungry.  They are ok  with the idea of home today with hospice.   Objective: Vitals:   11/23/22 0927 11/23/22 1205  BP:  (!) 189/72  Pulse:  68  Resp:  18  Temp:  98.2 F (36.8 C)  SpO2: 96% 98%    Intake/Output Summary (Last 24 hours) at 11/23/2022 1215 Last data filed at 11/23/2022 0900 Gross per 24 hour  Intake 2118.86 ml  Output --  Net 2118.86 ml   There were no vitals filed for this visit.  Exam:  General:  Appears calm and comfortable and is in NAD, sleeping throughout evaluation Eyes:  normal lids, closed throughout ENT:  grossly normal hearing, lips & tongue, mmm Neck:  no LAD, masses or thyromegaly Cardiovascular:  RRR, no m/r/g. No LE edema.  Respiratory:   CTA bilaterally with no wheezes/rales/rhonchi.  Normal respiratory effort. Abdomen:  soft, apparently NT, distended Skin:  no rash or induration seen on  limited exam Musculoskeletal:  no bony abnormality Psychiatric:  slept throughout, no attempt at interaction Neurologic:  unable to effectively perform  Data Reviewed: I have reviewed the patient's lab results since admission.  Pertinent labs for today include:  None     Condition at discharge: good  The results of significant diagnostics from this hospitalization (including imaging, microbiology, ancillary and laboratory) are listed below for reference.   Imaging Studies: CT ABDOMEN PELVIS W CONTRAST  Result Date: 11/20/2022 CLINICAL DATA:  History of bowel obstruction one-week ago with firm abdominal distention EXAM: CT ABDOMEN AND PELVIS WITH CONTRAST TECHNIQUE: Multidetector CT imaging of the abdomen and pelvis was performed using the standard protocol following bolus administration of intravenous contrast. RADIATION DOSE REDUCTION: This exam was performed according to the departmental dose-optimization program which includes automated exposure control, adjustment of the mA and/or kV according to patient size and/or use of iterative reconstruction technique. CONTRAST:   OMNIPAQUE IOHEXOL 300 MG/ML  SOLN COMPARISON:  CT abdomen and pelvis dated 10/31/2022 FINDINGS: Lower chest: No focal consolidation or pulmonary nodule in the lung bases. New small right and trace left pleural effusions. partially imaged heart size is normal. Hepatobiliary: No focal hepatic lesions. Mild intra and extrahepatic bile duct dilation status post cholecystectomy. Pancreas: Diffusely atrophic pancreas. Spleen: Normal in size. Subcentimeter hypodensity (3:19), too small to characterize. Adrenals/Urinary Tract: No adrenal nodules. No suspicious renal mass, calculi or hydronephrosis. No focal bladder wall thickening. Stomach/Bowel: Partially imaged esophagus is fluid-filled. The stomach. Diffuse dilation of small and large bowel loops throughout the abdomen with disproportionate dilation of the rectosigmoid colon, which demonstrates mild mural thickening. Colonic diverticulosis without acute diverticulitis. Normal appendix. Vascular/Lymphatic: Aortic atherosclerosis. No enlarged abdominal or pelvic lymph nodes. Reproductive: No adnexal masses. Other: Mild presacral soft tissue stranding. No free fluid, fluid collection, or free air. Musculoskeletal: No acute or abnormal lytic or blastic osseous lesions. Multilevel degenerative changes of the partially imaged thoracic and lumbar spine. Body wall edema. Subcutaneous calcified granulomata in the bilateral gluteal regions. IMPRESSION: 1. Diffuse dilation of small and large bowel loops throughout the abdomen with disproportionate dilation of the rectosigmoid colon, which demonstrates mild mural thickening, suspicious for colonic pseudo-obstruction (Ogilvie syndrome). 2. New small right and trace left pleural effusions. 3.  Aortic Atherosclerosis (ICD10-I70.0). Electronically Signed   By: Agustin Cree M.D.   On: 11/20/2022 09:37   DG Abd Portable 1V  Result Date: 11/08/2022 CLINICAL DATA:  Abdominal distention. EXAM: PORTABLE ABDOMEN - 1 VIEW COMPARISON:   Abdominal radiograph dated 11/02/2022. FINDINGS: Multiple gas-filled loops of small and large bowel throughout the abdomen, similar in appearance to 11/02/2022. Air-fluid levels and free intraperitoneal air can not be excluded on the supine exam. No definite gas overlies the rectum. Degenerative changes are seen in the spine. IMPRESSION: Multiple gas-filled loops of small and large bowel throughout the abdomen, similar in appearance to 11/02/2022. Electronically Signed   By: Romona Curls M.D.   On: 11/08/2022 10:08   DG Abd Portable 1V-Small Bowel Obstruction Protocol-24 hr delay  Result Date: 11/02/2022 CLINICAL DATA:  Small bowel obstruction.  24 hour delayed images. EXAM: PORTABLE ABDOMEN - 1 VIEW COMPARISON:  11/01/2022 11:29 p.m. FINDINGS: Nasogastric tube unchanged with tip over the right upper quadrant likely over the distal stomach or proximal duodenum. There multiple air-filled large and small bowel loops without significant change. Contrast is present within the colon, mostly over the rectosigmoid colon. Findings suggest ileus. Remainder the exam is unchanged. IMPRESSION: Multiple air-filled large and  small bowel loops without significant change with contrast present within the colon, mostly over the rectosigmoid colon. Findings suggest ileus. Electronically Signed   By: Elberta Fortis M.D.   On: 11/02/2022 14:45   DG Abd Portable 1V  Result Date: 11/02/2022 CLINICAL DATA:  Nasogastric tube placement EXAM: PORTABLE ABDOMEN - 1 VIEW COMPARISON:  11/01/2022 at 6:58 p.m. FINDINGS: Tip and side port of the nasogastric tube project within the distal stomach. Diffuse gas distended colon and small bowel throughout the abdomen. IMPRESSION: Tip and side port of the nasogastric tube project within the distal stomach. Electronically Signed   By: Deatra Robinson M.D.   On: 11/02/2022 00:07   DG Abd Portable 1V-Small Bowel Obstruction Protocol-initial, 8 hr delay  Result Date: 11/01/2022 CLINICAL DATA:   Small-bowel obstruction.  8 hour delay. EXAM: PORTABLE ABDOMEN - 1 VIEW COMPARISON:  Abdominal radiograph dated 10/31/2022. FINDINGS: Enteric tube with tip and side port in the epigastric area in the region of the GE junction. Recommend further advancing for optimal positioning. There is diffuse dilatation of small bowel with probable air distention of the colon. Oral contrast noted in the proximal colon as well as within the rectum. IMPRESSION: 1. Enteric tube with tip and side port in the region of the GE junction. Recommend further advancing for optimal positioning. 2. Diffuse dilatation of small bowel with oral contrast in the proximal colon and rectum. Findings favored to represent an ileus rather than a small bowel obstruction. Clinical correlation is recommended. Electronically Signed   By: Elgie Collard M.D.   On: 11/01/2022 23:02   DG Abd Portable 1V-Small Bowel Obstruction Protocol-initial, 8 hr delay  Result Date: 11/01/2022 CLINICAL DATA:  8 hour delay small-bowel obstruction protocol. EXAM: PORTABLE ABDOMEN - 1 VIEW COMPARISON:  CT abdomen and pelvis 10/31/2022 FINDINGS: No significant oral contrast identified within small bowel or colon. Diffusely dilated small bowel loops are present measuring up to 4 cm. Contrast is seen in the bladder. IMPRESSION: 1. Diffusely dilated small bowel again seen. No significant oral contrast identified in small bowel or colon. Findings are concerning for severe ileus or small-bowel obstruction. Electronically Signed   By: Darliss Cheney M.D.   On: 11/01/2022 00:17   DG Abd Portable 1 View  Result Date: 10/31/2022 CLINICAL DATA:  89 year old female status post nasogastric tube placement. EXAM: PORTABLE ABDOMEN - 1 VIEW COMPARISON:  Abdominal radiograph 10/13/2022. FINDINGS: Nasogastric tube in position with tip in the mid body of the stomach and side port just distal to the gastroesophageal junction. Visualized portions of the upper abdomen again demonstrate  numerous gas-filled loops of bowel, some of which appear dilated (poorly evaluated on this single view examination which excludes the majority of the lower 2/3 of the abdomen). IMPRESSION: 1. Tip of nasogastric tube is in the body of the stomach with side port just distal to the gastroesophageal junction. 2. Persistent bowel dilatation, likely reflective of small bowel obstruction as demonstrated on contemporaneously obtained CT of the abdomen and pelvis. Electronically Signed   By: Trudie Reed M.D.   On: 10/31/2022 06:06   CT ABDOMEN PELVIS W CONTRAST  Result Date: 10/31/2022 CLINICAL DATA:  Cough for ground emesis and abdominal distension. EXAM: CT ABDOMEN AND PELVIS WITH CONTRAST TECHNIQUE: Multidetector CT imaging of the abdomen and pelvis was performed using the standard protocol following bolus administration of intravenous contrast. RADIATION DOSE REDUCTION: This exam was performed according to the departmental dose-optimization program which includes automated exposure control, adjustment of the mA and/or  kV according to patient size and/or use of iterative reconstruction technique. CONTRAST:  OMNIPAQUE IOHEXOL 300 MG/ML  SOLN COMPARISON:  October 13, 2022 FINDINGS: Lower chest: There is a small right pleural effusion. This is mildly increased in size when compared to the prior exam. Hepatobiliary: No focal liver abnormality is seen. Status post cholecystectomy. No biliary dilatation. Pancreas: Unremarkable. No pancreatic ductal dilatation or surrounding inflammatory changes. Spleen: Normal in size without focal abnormality. Adrenals/Urinary Tract: Adrenal glands are unremarkable. Kidneys are normal, without renal calculi, focal lesion, or hydronephrosis. The urinary bladder is poorly distended and subsequently limited in evaluation. Stomach/Bowel: Stomach is within normal limits. The appendix is normal. Numerous dilated small bowel loops are seen throughout the abdomen and pelvis (maximum small  bowel diameter of approximately 4.3 cm). A transition zone is seen at the level of the terminal ileum (axial CT images 32 through 34, CT series 4). Noninflamed diverticula are seen throughout the large bowel. Persistently distended mid to distal sigmoid colon and rectum is noted. Vascular/Lymphatic: Aortic atherosclerosis. No enlarged abdominal or pelvic lymph nodes. Reproductive: Uterus and bilateral adnexa are unremarkable. Other: No abdominal wall hernia or abnormality. No abdominopelvic ascites. Musculoskeletal: Multilevel chronic and degenerative changes seen throughout the lumbar spine. IMPRESSION: 1. Findings consistent with a partial small bowel obstruction with a transition zone at the level of the terminal ileum. 2. Colonic diverticulosis. 3. Small right pleural effusion. 4. Aortic atherosclerosis. Aortic Atherosclerosis (ICD10-I70.0). Electronically Signed   By: Aram Candela M.D.   On: 10/31/2022 03:24   DG Chest 1 View  Result Date: 10/31/2022 CLINICAL DATA:  Coffee brown emesis and abdominal distension. EXAM: CHEST  1 VIEW COMPARISON:  October 13, 2022 FINDINGS: The heart size and mediastinal contours are within normal limits. There is moderate severity calcification of the aortic arch. Both lungs are clear. Degenerative changes are seen involving the right shoulder and throughout the thoracic spine. IMPRESSION: No active disease. Electronically Signed   By: Aram Candela M.D.   On: 10/31/2022 03:16    Microbiology: Results for orders placed or performed during the hospital encounter of 11/20/22  MRSA Next Gen by PCR, Nasal     Status: None   Collection Time: 11/21/22  8:10 AM   Specimen: Nasal Mucosa; Nasal Swab  Result Value Ref Range Status   MRSA by PCR Next Gen NOT DETECTED NOT DETECTED Final    Comment: (NOTE) The GeneXpert MRSA Assay (FDA approved for NASAL specimens only), is one component of a comprehensive MRSA colonization surveillance program. It is not intended to  diagnose MRSA infection nor to guide or monitor treatment for MRSA infections. Test performance is not FDA approved in patients less than 15 years old. Performed at [facility], [address] [place], [place] [address]     Labs: CBC: Recent Labs  Lab 11/20/22 1027 11/21/22 0430 11/22/22 0419  WBC 9.2 5.5 5.3  NEUTROABS  --   --  2.4  HGB 9.5* 8.1* 8.2*  HCT 29.5* 26.0* 27.2*  MCV 95.5 99.2 102.3*  PLT 194 169 166   Basic Metabolic Panel: Recent Labs  Lab 11/20/22 0740 11/21/22 0430 11/22/22 0419  NA 136 138 141  K 4.4 4.3 4.0  CL 95* 102 108  CO2 31 29 24   GLUCOSE 143* 129* 94  BUN 36* 39* 28*  CREATININE 0.85 0.82 0.71  CALCIUM 9.5 8.7* 8.6*   Liver Function Tests: Recent Labs  Lab 11/20/22 0740  AST 23  ALT 16  ALKPHOS 54  BILITOT 0.5  PROT 6.5  ALBUMIN 3.2*   CBG: No results for input(s): "GLUCAP" in the last 168 hours.  Discharge time spent: greater than 30 minutes.  Signed: Jonah Blue, MD [facility] 11/23/2022

## 2024-02-24 DEATH — deceased
